# Patient Record
Sex: Female | Born: 1950 | Race: Black or African American | Hispanic: No | Marital: Married | Smoking: Former smoker
Health system: Southern US, Community
[De-identification: ages and names within clinical notes are randomized; demographics above are authoritative.]

## PROBLEM LIST (undated history)

## (undated) DIAGNOSIS — J45909 Unspecified asthma, uncomplicated: Secondary | ICD-10-CM

## (undated) DIAGNOSIS — I10 Essential (primary) hypertension: Secondary | ICD-10-CM

## (undated) DIAGNOSIS — B20 Human immunodeficiency virus [HIV] disease: Secondary | ICD-10-CM

## (undated) DIAGNOSIS — H269 Unspecified cataract: Secondary | ICD-10-CM

## (undated) DIAGNOSIS — T7840XA Allergy, unspecified, initial encounter: Secondary | ICD-10-CM

## (undated) DIAGNOSIS — E785 Hyperlipidemia, unspecified: Secondary | ICD-10-CM

## (undated) DIAGNOSIS — M1990 Unspecified osteoarthritis, unspecified site: Secondary | ICD-10-CM

## (undated) DIAGNOSIS — Z21 Asymptomatic human immunodeficiency virus [HIV] infection status: Secondary | ICD-10-CM

## (undated) DIAGNOSIS — M8580 Other specified disorders of bone density and structure, unspecified site: Secondary | ICD-10-CM

## (undated) DIAGNOSIS — Z5189 Encounter for other specified aftercare: Secondary | ICD-10-CM

## (undated) DIAGNOSIS — F32A Depression, unspecified: Secondary | ICD-10-CM

## (undated) HISTORY — PX: CHOLECYSTECTOMY: SHX55

## (undated) HISTORY — DX: Hyperlipidemia, unspecified: E78.5

## (undated) HISTORY — DX: Human immunodeficiency virus (HIV) disease: B20

## (undated) HISTORY — DX: Depression, unspecified: F32.A

## (undated) HISTORY — DX: Other specified disorders of bone density and structure, unspecified site: M85.80

## (undated) HISTORY — DX: Unspecified cataract: H26.9

## (undated) HISTORY — PX: LAPAROSCOPIC SALPINGOOPHERECTOMY: SUR795

## (undated) HISTORY — PX: OTHER SURGICAL HISTORY: SHX169

## (undated) HISTORY — DX: Unspecified osteoarthritis, unspecified site: M19.90

## (undated) HISTORY — DX: Unspecified asthma, uncomplicated: J45.909

## (undated) HISTORY — PX: CYSTECTOMY: SUR359

## (undated) HISTORY — DX: Encounter for other specified aftercare: Z51.89

## (undated) HISTORY — PX: CYST REMOVAL HAND: SHX6279

## (undated) HISTORY — PX: CATARACT EXTRACTION, BILATERAL: SHX1313

## (undated) HISTORY — DX: Asymptomatic human immunodeficiency virus (hiv) infection status: Z21

## (undated) HISTORY — DX: Allergy, unspecified, initial encounter: T78.40XA

## (undated) HISTORY — DX: Essential (primary) hypertension: I10

---

## 2013-09-14 DIAGNOSIS — Z860101 Personal history of adenomatous and serrated colon polyps: Secondary | ICD-10-CM | POA: Insufficient documentation

## 2013-09-14 DIAGNOSIS — Z86010 Personal history of colonic polyps: Secondary | ICD-10-CM

## 2013-09-14 HISTORY — DX: Personal history of adenomatous and serrated colon polyps: Z86.0101

## 2013-09-14 HISTORY — DX: Personal history of colonic polyps: Z86.010

## 2013-09-14 HISTORY — PX: COLONOSCOPY: SHX174

## 2013-09-14 HISTORY — PX: POLYPECTOMY: SHX149

## 2015-03-20 DIAGNOSIS — M810 Age-related osteoporosis without current pathological fracture: Secondary | ICD-10-CM | POA: Diagnosis not present

## 2015-03-20 DIAGNOSIS — B20 Human immunodeficiency virus [HIV] disease: Secondary | ICD-10-CM | POA: Diagnosis not present

## 2015-03-20 DIAGNOSIS — I10 Essential (primary) hypertension: Secondary | ICD-10-CM | POA: Diagnosis not present

## 2015-07-17 DIAGNOSIS — B20 Human immunodeficiency virus [HIV] disease: Secondary | ICD-10-CM | POA: Diagnosis not present

## 2015-07-17 DIAGNOSIS — Z79899 Other long term (current) drug therapy: Secondary | ICD-10-CM | POA: Diagnosis not present

## 2015-07-17 DIAGNOSIS — M810 Age-related osteoporosis without current pathological fracture: Secondary | ICD-10-CM | POA: Diagnosis not present

## 2015-07-17 DIAGNOSIS — I10 Essential (primary) hypertension: Secondary | ICD-10-CM | POA: Diagnosis not present

## 2015-07-17 DIAGNOSIS — M1990 Unspecified osteoarthritis, unspecified site: Secondary | ICD-10-CM | POA: Diagnosis not present

## 2015-11-13 DIAGNOSIS — R5381 Other malaise: Secondary | ICD-10-CM | POA: Diagnosis not present

## 2015-11-13 DIAGNOSIS — M1990 Unspecified osteoarthritis, unspecified site: Secondary | ICD-10-CM | POA: Diagnosis not present

## 2015-11-13 DIAGNOSIS — B20 Human immunodeficiency virus [HIV] disease: Secondary | ICD-10-CM | POA: Diagnosis not present

## 2015-11-13 DIAGNOSIS — I10 Essential (primary) hypertension: Secondary | ICD-10-CM | POA: Diagnosis not present

## 2015-11-13 DIAGNOSIS — E559 Vitamin D deficiency, unspecified: Secondary | ICD-10-CM | POA: Diagnosis not present

## 2015-11-13 DIAGNOSIS — M810 Age-related osteoporosis without current pathological fracture: Secondary | ICD-10-CM | POA: Diagnosis not present

## 2016-03-18 DIAGNOSIS — R5383 Other fatigue: Secondary | ICD-10-CM | POA: Diagnosis not present

## 2016-03-18 DIAGNOSIS — E782 Mixed hyperlipidemia: Secondary | ICD-10-CM | POA: Diagnosis not present

## 2016-03-18 DIAGNOSIS — B20 Human immunodeficiency virus [HIV] disease: Secondary | ICD-10-CM | POA: Diagnosis not present

## 2016-06-11 DIAGNOSIS — I10 Essential (primary) hypertension: Secondary | ICD-10-CM | POA: Diagnosis not present

## 2016-06-11 DIAGNOSIS — Z23 Encounter for immunization: Secondary | ICD-10-CM | POA: Diagnosis not present

## 2016-06-11 DIAGNOSIS — B20 Human immunodeficiency virus [HIV] disease: Secondary | ICD-10-CM | POA: Diagnosis not present

## 2016-06-11 DIAGNOSIS — M810 Age-related osteoporosis without current pathological fracture: Secondary | ICD-10-CM | POA: Diagnosis not present

## 2016-06-11 DIAGNOSIS — E559 Vitamin D deficiency, unspecified: Secondary | ICD-10-CM | POA: Diagnosis not present

## 2016-06-11 DIAGNOSIS — R600 Localized edema: Secondary | ICD-10-CM | POA: Diagnosis not present

## 2016-08-19 ENCOUNTER — Ambulatory Visit (INDEPENDENT_AMBULATORY_CARE_PROVIDER_SITE_OTHER): Payer: Medicare Other | Admitting: Internal Medicine

## 2016-08-19 ENCOUNTER — Encounter: Payer: Self-pay | Admitting: Internal Medicine

## 2016-08-19 VITALS — BP 144/90 | HR 62 | Temp 98.1°F | Ht 68.0 in | Wt 174.8 lb

## 2016-08-19 DIAGNOSIS — M810 Age-related osteoporosis without current pathological fracture: Secondary | ICD-10-CM | POA: Diagnosis not present

## 2016-08-19 DIAGNOSIS — I10 Essential (primary) hypertension: Secondary | ICD-10-CM | POA: Diagnosis not present

## 2016-08-19 DIAGNOSIS — B20 Human immunodeficiency virus [HIV] disease: Secondary | ICD-10-CM | POA: Diagnosis not present

## 2016-08-19 LAB — COMPLETE METABOLIC PANEL WITHOUT GFR
ALT: 15 U/L (ref 6–29)
AST: 21 U/L (ref 10–35)
Albumin: 4.1 g/dL (ref 3.6–5.1)
Alkaline Phosphatase: 57 U/L (ref 33–130)
BUN: 13 mg/dL (ref 7–25)
CO2: 28 mmol/L (ref 20–31)
Calcium: 9.3 mg/dL (ref 8.6–10.4)
Chloride: 102 mmol/L (ref 98–110)
Creat: 0.94 mg/dL (ref 0.50–0.99)
GFR, Est African American: 73 mL/min
GFR, Est Non African American: 63 mL/min
Glucose, Bld: 87 mg/dL (ref 65–99)
Potassium: 4 mmol/L (ref 3.5–5.3)
Sodium: 140 mmol/L (ref 135–146)
Total Bilirubin: 0.9 mg/dL (ref 0.2–1.2)
Total Protein: 6.7 g/dL (ref 6.1–8.1)

## 2016-08-19 LAB — CBC WITH DIFFERENTIAL/PLATELET
Basophils Absolute: 58 cells/uL (ref 0–200)
Basophils Relative: 1 %
Eosinophils Absolute: 116 cells/uL (ref 15–500)
Eosinophils Relative: 2 %
HCT: 41.5 % (ref 35.0–45.0)
Hemoglobin: 14 g/dL (ref 11.7–15.5)
Lymphocytes Relative: 42 %
Lymphs Abs: 2436 cells/uL (ref 850–3900)
MCH: 30.5 pg (ref 27.0–33.0)
MCHC: 33.7 g/dL (ref 32.0–36.0)
MCV: 90.4 fL (ref 80.0–100.0)
MPV: 9.6 fL (ref 7.5–12.5)
Monocytes Absolute: 580 cells/uL (ref 200–950)
Monocytes Relative: 10 %
Neutro Abs: 2610 cells/uL (ref 1500–7800)
Neutrophils Relative %: 45 %
Platelets: 271 10*3/uL (ref 140–400)
RBC: 4.59 MIL/uL (ref 3.80–5.10)
RDW: 14.2 % (ref 11.0–15.0)
WBC: 5.8 10*3/uL (ref 3.8–10.8)

## 2016-08-19 MED ORDER — ENALAPRIL-HYDROCHLOROTHIAZIDE 10-25 MG PO TABS: 1.0000 | ORAL_TABLET | Freq: Every day | ORAL | 6 refills | Status: DC

## 2016-08-19 NOTE — Progress Notes (Signed)
[name], female   DOB: [birth year], 65 y.o.   MRN: [id number]    Location:  [facility] Place of Service: OFFICE    Advanced Directive information Does Patient Have a Medical Advance Directive?: Yes, Type of Advance Directive: Living will  Chief Complaint  Patient presents with  . Establish Care    New patient to establish care  . Referral    Infectious disease specialists    HPI:  65 yo female seen today as a new pt. She was referred by Dr Lorelei Pont. She needs referral to ID as she needs to continue her HIV meds (complera and isentress). Last HIV load was nondetectable and she does not know CD4 count.  Hx asthma - well controlled. Rarely uses prn HFA  HTN - takes metoprolol succ and enalapril hct  Osteoporosis - stable on fosamax. Last DXA 2 yrs ago  Remote tobacco abuse hx   Past Medical History:  Diagnosis Date  . Asthma   . High blood pressure   . HIV (human immunodeficiency virus infection) ([place])   . Osteoarthritis     History reviewed. No pertinent surgical history.  Patient Care Team: [name], DO as PCP - General (Internal Medicine)  Social History   Social History  . Marital status: Married    Spouse name: N/A  . Number of children: N/A  . Years of education: N/A   Occupational History  . Not on file.   Social History Main Topics  . Smoking status: Former Smoker    Packs/day: 1.00    Years: 40.00    Types: Cigarettes  . Smokeless tobacco: Never Used  . Alcohol use No  . Drug use: No  . Sexual activity: Not on file   Other Topics Concern  . Not on file   Social History Narrative   Diet? Regular      Do you drink/eat things with caffeine? Coffee      Marital status?  Married                                  What year were you married? March 16, 2009      Do you live in a house, apartment, assisted living, condo, trailer, etc.? House      Is it one or more stories? 1 story      How many persons live in your home? 2        Do you have any pets in your home? (please list) yes-dog      Current or past profession: Administrator      Do you exercise?  no                                    Type & how often? n/a      Do you have a living will? no      Do you have a DNR form?     no                             If not, do you want to discuss one? Yes      Do you have signed POA/HPOA for forms? no           reports that she has quit smoking. Her smoking use included  Cigarettes. She has a 40.00 pack-year smoking history. She has never used smokeless tobacco. She reports that she does not drink alcohol or use drugs.  Family History  Problem Relation Age of Onset  . Diabetes Mother     died at age 105  . Hypertension Mother   . Asthma Sister   . Diabetes Sister   . Hypertension Sister   . Arthritis Sister   . Diabetes Sister   . Hypertension Sister   . Asthma Sister   . Arthritis Sister   . Asthma Son     controlled   Family Status  Relation Status  . Mother Deceased  . Father Deceased  . Sister Alive  . Sister Alive  . Sister Alive  . Daughter Alive  . Son Alive    Immunization History  Administered Date(s) Administered  . Influenza-Unspecified 06/11/2016    No Known Allergies  Medications: Patient's Medications  New Prescriptions   No medications on file  Previous Medications   ALENDRONATE (FOSAMAX) 70 MG TABLET    Take 70 mg by mouth once a week. Take with a full glass of water on an empty stomach.   EMTRICITABINE-RILPIVIR-TENOFOVIR DF (COMPLERA) 200-25-300 MG TABLET    Take 1 tablet by mouth daily.   ENALAPRIL-HYDROCHLOROTHIAZIDE 5-12.5 MG TABLET    Take 1 tablet by mouth daily.   METOPROLOL SUCCINATE (TOPROL-XL) 100 MG 24 HR TABLET    Take 100 mg by mouth daily. Take with or immediately following a meal.   RALTEGRAVIR (ISENTRESS) 400 MG TABLET    Take 400 mg by mouth 2 (two) times daily.  Modified Medications   No medications on file  Discontinued Medications   No  medications on file    Review of Systems  HENT: Positive for dental problem (wears dentures).   Eyes:       Cats ou  Gastrointestinal:       Hemorrhoid hx  Hematological: Bruises/bleeds easily.  All other systems reviewed and are negative.   Vitals:   08/19/16 0838  BP: (!) 144/90 repeat BP by myself 140/90  Pulse: 62  Temp: 98.1 F (36.7 C)  TempSrc: Oral  SpO2: 95%  Weight: 174 lb 12.8 oz (79.3 kg)  Height: 5' 8" (1.727 m)   Body mass index is 26.58 kg/m.  Physical Exam  Constitutional: She is oriented to person, place, and time. She appears well-developed and well-nourished.  HENT:  Mouth/Throat: Oropharynx is clear and moist. No oropharyngeal exudate.  Eyes: Pupils are equal, round, and reactive to light. No scleral icterus.  Neck: Neck supple. Carotid bruit is not present. No tracheal deviation present. No thyromegaly present.  Cardiovascular: Normal rate, regular rhythm, normal heart sounds and intact distal pulses.  Exam reveals no gallop and no friction rub.   No murmur heard. No LE edema b/l. no calf TTP.   Pulmonary/Chest: Effort normal and breath sounds normal. No stridor. No respiratory distress. She has no wheezes. She has no rales.  Abdominal: Soft. Bowel sounds are normal. She exhibits no distension and no mass. There is no hepatomegaly. There is no tenderness. There is no rebound and no guarding.  Lymphadenopathy:    She has no cervical adenopathy.  Neurological: She is alert and oriented to person, place, and time.  Skin: Skin is warm and dry. No rash noted.  Psychiatric: She has a normal mood and affect. Her behavior is normal. Judgment and thought content normal.     Labs reviewed: No results found for any   previous visit.    No results found.   Assessment/Plan   ICD-9-CM ICD-10-CM   1. Human immunodeficiency virus (HIV) disease ([place]) 042 B20 Ambulatory referral to Infectious Disease     CMP with eGFR     CBC with Differential/Platelets      CD4 Count     Lymph Enumeration,Helper/Suppressor     CANCELED: T-helper cells (CD4) count (not at [facility])  2. Essential hypertension 401.9 I10 enalapril-hydrochlorothiazide (VASERETIC) 10-25 MG tablet     CBC with Differential/Platelets  3. Age related osteoporosis, unspecified pathological fracture presence 733.01 M81.0    Next available CPE/AWV. Needs fasting lab prior to appt (lipid panel, tsh, UA)  Increase enalapril hct to 2 tabs daily until bottle empty then get new script from pharmacy  Will call with referral to Dr Baxter Flattery  Will call with lab results  Get old [place]. Perlie Gold  [facility] and Adult Medicine [address] [place], [place] [address] [phone] Cell (Monday-Friday 8 AM - 5 PM) [phone] After 5 PM and follow prompts

## 2016-08-19 NOTE — Patient Instructions (Signed)
Next available CPE/AWV. Needs fasting lab prior to appt  Increase enalapril hct to 2 tabs daily until bottle empty then get new script from pharmacy  Will call with referral to Dr Baxter Flattery  Will call with lab results

## 2016-08-20 LAB — LYMPH ENUMERATION,HELPER/SUPPRESSOR
%CD8 (Cytotoxic/Suppressor): 31 % (ref 14–37)
Absolute CD4: 594 cells/uL (ref 381–1469)
CD4 T Helper %: 24 % — ABNORMAL LOW (ref 32–62)
CD4/CD8 Ratio: 0.8 (ref 0.8–5.0)
CD8 T Cell Abs: 765 cells/uL (ref 196–1060)
Total lymphocyte count: 2474 cells/uL (ref 700–3300)

## 2016-08-20 LAB — T-HELPER CELLS (CD4) COUNT (NOT AT ARMC)
Absolute CD4: 594 cells/uL (ref 381–1469)
CD4 T Helper %: 24 % — ABNORMAL LOW (ref 32–62)
Total lymphocyte count: 2474 cells/uL (ref 700–3300)

## 2016-09-09 ENCOUNTER — Other Ambulatory Visit: Payer: Self-pay

## 2016-09-09 ENCOUNTER — Other Ambulatory Visit: Payer: Medicare Other

## 2016-09-09 DIAGNOSIS — I10 Essential (primary) hypertension: Secondary | ICD-10-CM | POA: Diagnosis not present

## 2016-09-09 LAB — LIPID PANEL
Cholesterol: 184 mg/dL (ref <200)
HDL: 44 mg/dL — ABNORMAL LOW (ref >50)
LDL Cholesterol: 122 mg/dL — ABNORMAL HIGH (ref <100)
Total CHOL/HDL Ratio: 4.2 Ratio (ref <5.0)
Triglycerides: 91 mg/dL (ref <150)
VLDL: 18 mg/dL (ref <30)

## 2016-09-09 LAB — TSH: TSH: 0.92 mIU/L

## 2016-09-10 LAB — URINALYSIS, ROUTINE W REFLEX MICROSCOPIC
Bilirubin Urine: NEGATIVE
Glucose, UA: NEGATIVE
Hgb urine dipstick: NEGATIVE
Ketones, ur: NEGATIVE
Leukocytes, UA: NEGATIVE
Nitrite: NEGATIVE
Protein, ur: NEGATIVE
Specific Gravity, Urine: 1.024 (ref 1.001–1.035)
pH: 5.5 (ref 5.0–8.0)

## 2016-09-11 ENCOUNTER — Ambulatory Visit: Payer: Medicare Other

## 2016-09-16 ENCOUNTER — Ambulatory Visit (INDEPENDENT_AMBULATORY_CARE_PROVIDER_SITE_OTHER): Payer: Medicare Other

## 2016-09-16 VITALS — BP 138/88 | HR 68 | Temp 98.0°F | Ht 68.0 in | Wt 175.2 lb

## 2016-09-16 DIAGNOSIS — Z0000 Encounter for general adult medical examination without abnormal findings: Secondary | ICD-10-CM | POA: Diagnosis not present

## 2016-09-16 NOTE — Patient Instructions (Addendum)
[name] , Thank you for taking time to come for your Medicare Wellness Visit. I appreciate your ongoing commitment to your health goals. Please review the following plan we discussed and let me know if I can assist you in the future.   These are the goals we discussed: Goals    . Increase physical activity          Starting 09/16/2016, I will attempt to increase my physical activity to 3 times per day.        This is a list of the screening recommended for you and due dates:  Health Maintenance  Topic Date Due  .  Hepatitis C: One time screening is recommended by Center for Disease Control  (CDC) for  adults born from [birth year] through 1967.   1950/12/19  . Pneumonia vaccines (1 of 2 - PCV13) 03/15/2017*  . Shingles Vaccine  09/16/2017*  . Tetanus Vaccine  09/23/2017*  . Mammogram  02/26/2017  . Colon Cancer Screening  11/03/2023  . Flu Shot  Completed  . DEXA scan (bone density measurement)  Completed  *Topic was postponed. The date shown is not the original due date.  Preventive Care for Adults  A healthy lifestyle and preventive care can promote health and wellness. Preventive health guidelines for adults include the following key practices.  . A routine yearly physical is a good way to check with your health care provider about your health and preventive screening. It is a chance to share any concerns and updates on your health and to receive a thorough exam.  . Visit your dentist for a routine exam and preventive care every 6 months. Brush your teeth twice a day and floss once a day. Good oral hygiene prevents tooth decay and gum disease.  . The frequency of eye exams is based on your age, health, family medical history, use  of contact lenses, and other factors. Follow your health care provider's ecommendations for frequency of eye exams.  . Eat a healthy diet. Foods like vegetables, fruits, whole grains, low-fat dairy products, and lean protein foods contain the nutrients you  need without too many calories. Decrease your intake of foods high in solid fats, added sugars, and salt. Eat the right amount of calories for you. Get information about a proper diet from your health care provider, if necessary.  . Regular physical exercise is one of the most important things you can do for your health. Most adults should get at least 150 minutes of moderate-intensity exercise (any activity that increases your heart rate and causes you to sweat) each week. In addition, most adults need muscle-strengthening exercises on 2 or more days a week.  Silver Sneakers may be a benefit available to you. To determine eligibility, you may visit the website: www.silversneakers.com or contact program at [phone] Mon-Fri between 8AM-8PM.   . Maintain a healthy weight. The body mass index (BMI) is a screening tool to identify possible weight problems. It provides an estimate of body fat based on height and weight. Your health care provider can find your BMI and can help you achieve or maintain a healthy weight.   For adults 20 years and older: ? A BMI below 18.5 is considered underweight. ? A BMI of 18.5 to 24.9 is normal. ? A BMI of 25 to 29.9 is considered overweight. ? A BMI of 30 and above is considered obese.   . Maintain normal blood lipids and cholesterol levels by exercising and minimizing your intake of saturated  fat. Eat a balanced diet with plenty of fruit and vegetables. Blood tests for lipids and cholesterol should begin at age 4 and be repeated every 5 years. If your lipid or cholesterol levels are high, you are over 50, or you are at high risk for heart disease, you may need your cholesterol levels checked more frequently. Ongoing high lipid and cholesterol levels should be treated with medicines if diet and exercise are not working.  . If you smoke, find out from your health care provider how to quit. If you do not use tobacco, please do not start.  . If you choose to drink  alcohol, please do not consume more than 2 drinks per day. One drink is considered to be 12 ounces (355 mL) of beer, 5 ounces (148 mL) of wine, or 1.5 ounces (44 mL) of liquor.  . If you are 94-68 years old, ask your health care provider if you should take aspirin to prevent strokes.  . Use sunscreen. Apply sunscreen liberally and repeatedly throughout the day. You should seek shade when your shadow is shorter than you. Protect yourself by wearing long sleeves, pants, a wide-brimmed hat, and sunglasses year round, whenever you are outdoors.  . Once a month, do a whole body skin exam, using a mirror to look at the skin on your back. Tell your health care provider of new moles, moles that have irregular borders, moles that are larger than a pencil eraser, or moles that have changed in shape or color.

## 2016-09-16 NOTE — Progress Notes (Signed)
Subjective:   Maria Adkins is a 66 y.o. female who presents for an Initial Medicare Annual Wellness Visit.  Review of Systems     Cardiac Risk Factors include: advanced age (>61men, >30 women);hypertension     Objective:    Today's Vitals   09/16/16 1033  BP: 138/88  Pulse: 68  Temp: 98 F (36.7 C)  TempSrc: Oral  SpO2: 95%  Weight: 175 lb 3.2 oz (79.5 kg)  Height: 5\' 8"  (1.727 m)   Body mass index is 26.64 kg/m.   Current Medications (verified) Outpatient Encounter Prescriptions as of 09/16/2016  Medication Sig  . alendronate (FOSAMAX) 70 MG tablet Take 70 mg by mouth once a week. Take with a full glass of water on an empty stomach.  [name] emtricitabine-rilpivir-tenofovir DF (COMPLERA) 200-25-300 MG tablet Take 1 tablet by mouth daily.  . enalapril-hydrochlorothiazide (VASERETIC) 10-25 MG tablet Take 1 tablet by mouth daily.  . raltegravir (ISENTRESS) 400 MG tablet Take 400 mg by mouth 2 (two) times daily.  . [DISCONTINUED] metoprolol succinate (TOPROL-XL) 100 MG 24 hr tablet Take 100 mg by mouth daily. Take with or immediately following a meal.   No facility-administered encounter medications on file as of 09/16/2016.     Allergies (verified) Patient has no known allergies.   History: Past Medical History:  Diagnosis Date  . Asthma   . High blood pressure   . HIV (human immunodeficiency virus infection) ([place])   . Osteoarthritis    Past Surgical History:  Procedure Laterality Date  . Biopsy of Liver    . CATARACT EXTRACTION, BILATERAL    . CHOLECYSTECTOMY    . CYST REMOVAL HAND Left   . LAPAROSCOPIC SALPINGOOPHERECTOMY Right    Family History  Problem Relation Age of Onset  . Diabetes Mother     died at age 31  . Hypertension Mother   . Asthma Sister   . Diabetes Sister   . Hypertension Sister   . Arthritis Sister   . Diabetes Sister   . Hypertension Sister   . Asthma Sister   . Arthritis Sister   . Asthma Son     controlled   Social History    Occupational History  . Not on file.   Social History Main Topics  . Smoking status: Former Smoker    Packs/day: 1.00    Years: 40.00    Types: Cigarettes  . Smokeless tobacco: Never Used  . Alcohol use No  . Drug use: No  . Sexual activity: Yes    Tobacco Counseling Counseling given: No   Activities of Daily Living In your present state of health, do you have any difficulty performing the following activities: 09/16/2016  Hearing? N  Vision? N  Difficulty concentrating or making decisions? Y  Walking or climbing stairs? N  Dressing or bathing? N  Doing errands, shopping? N  Preparing Food and eating ? N  Using the Toilet? N  In the past six months, have you accidently leaked urine? N  Do you have problems with loss of bowel control? N  Managing your Medications? N  Managing your Finances? N  Housekeeping or managing your Housekeeping? N  Some recent data might be hidden    Immunizations and Health Maintenance Immunization History  Administered Date(s) Administered  . Influenza-Unspecified 06/11/2016   Health Maintenance Due  Topic Date Due  . Hepatitis C Screening  1951/08/14    Patient Care Team: [name], DO as PCP - General (Internal Medicine)  Indicate any  recent Medical Services you may have received from other than [facility] providers in the past year (date may be approximate).     Assessment:   This is a routine wellness examination for [name].  Hearing/Vision screen  Hearing Screening   125Hz  250Hz  500Hz  1000Hz  2000Hz  3000Hz  4000Hz  6000Hz  8000Hz   Right ear:   100 0 0  100    Left ear:   100 100 100  100    Comments: Pt has not hearing screen. Pt has not noticed a problem hearing.    Visual Acuity Screening   Right eye Left eye Both eyes  Without correction: 20/20 20/25 20/20-1  With correction:     Comments: Last eye exam done last year in [place]. Due for an eye exam.    Dietary issues and exercise activities discussed: Current  Exercise Habits: Structured exercise class, Type of exercise: strength training/weights;walking;stretching, Time (Minutes): 20, Frequency (Times/Week): 7, Weekly Exercise (Minutes/Week): 140, Intensity: Moderate  Goals    . Increase physical activity          Starting 09/16/2016, I will attempt to increase my physical activity to 3 times per day.       Depression Screen PHQ 2/9 Scores 09/16/2016 08/19/2016  PHQ - 2 Score 1 0    Fall Risk Fall Risk  09/16/2016  Falls in the past year? No    Cognitive Function: MMSE - Mini Mental State Exam 09/16/2016  Orientation to time 5  Orientation to Place 5  Registration 3  Attention/ Calculation 5  Recall 3  Language- name 2 objects 2  Language- repeat 1  Language- follow 3 step command 3  Language- read & follow direction 1  Write a sentence 1  Copy design 1  Total score 30        Screening Tests Health Maintenance  Topic Date Due  . Hepatitis C Screening  1951/05/21  . PNA vac Low Risk Adult (1 of 2 - PCV13) 03/15/2017 (Originally 04/07/2014)  . ZOSTAVAX  09/16/2017 (Originally 04/07/2009)  . TETANUS/TDAP  09/23/2017 (Originally 04/07/1968)  . MAMMOGRAM  02/26/2017  . COLONOSCOPY  11/03/2023  . INFLUENZA VACCINE  Completed  . DEXA SCAN  Completed      Plan:    I have personally reviewed and addressed the Medicare Annual Wellness questionnaire and have noted the following in the patient's chart:  A. Medical and social history B. Use of alcohol, tobacco or illicit drugs  C. Current medications and supplements D. Functional ability and status E.  Nutritional status F.  Physical activity G. Advance directives H. List of other physicians I.  Hospitalizations, surgeries, and ER visits in previous 12 months J.  [place] to include hearing, vision, cognitive, depression L. Referrals and appointments - none  In addition, I have reviewed and discussed with patient certain preventive protocols, quality metrics, and best  practice recommendations. A written personalized care plan for preventive services as well as general preventive health recommendations were provided to patient.  See attached scanned questionnaire for additional information.   Signed,   Allyn Kenner, LPN Health Advisor    I have reviewed the health advisor's note and was available for consultation. I agree with documentation and plan.   Marigrace Mccole S. Perlie Gold  [facility] and Adult Medicine [address] [place], [place] [address] [phone] Cell (Monday-Friday 8 AM - 5 PM) [phone] After 5 PM and follow prompts

## 2016-09-16 NOTE — Progress Notes (Signed)
Quick Notes   Health Maintenance:  Due to pt's HIV, she would like to research the Pneumonia series before getting it.     Abnormal Screen: None; MMSE-30/30; Passed Clock test    Patient Concerns:  Since moving down from [place] and having to change insurances, pt no longer gets HIV meds for free. She is unable to pay for them OOP, with 1 med over $1000 per month. She has filled out paperwork for a program with the manufacture but she said it had to be filled out from you as well. I am also referring her to [facility] to see what other assistance we can help her with. Pt also requests to have a sleep aid, as she has been having a hard time sleeping at night since the move.     Nurse Concerns:  None

## 2016-09-24 ENCOUNTER — Encounter: Payer: Self-pay | Admitting: Internal Medicine

## 2016-09-24 ENCOUNTER — Telehealth: Payer: Self-pay

## 2016-09-24 ENCOUNTER — Ambulatory Visit (INDEPENDENT_AMBULATORY_CARE_PROVIDER_SITE_OTHER): Payer: Medicare Other | Admitting: Internal Medicine

## 2016-09-24 VITALS — BP 131/85 | HR 85 | Temp 98.0°F | Ht 68.0 in | Wt 176.1 lb

## 2016-09-24 DIAGNOSIS — B20 Human immunodeficiency virus [HIV] disease: Secondary | ICD-10-CM

## 2016-09-24 LAB — CBC WITH DIFFERENTIAL/PLATELET
Basophils Absolute: 59 cells/uL (ref 0–200)
Basophils Relative: 1 %
Eosinophils Absolute: 118 cells/uL (ref 15–500)
Eosinophils Relative: 2 %
HCT: 40.2 % (ref 35.0–45.0)
Hemoglobin: 13.5 g/dL (ref 11.7–15.5)
Lymphocytes Relative: 39 %
Lymphs Abs: 2301 cells/uL (ref 850–3900)
MCH: 30.2 pg (ref 27.0–33.0)
MCHC: 33.6 g/dL (ref 32.0–36.0)
MCV: 89.9 fL (ref 80.0–100.0)
MPV: 9.7 fL (ref 7.5–12.5)
Monocytes Absolute: 531 cells/uL (ref 200–950)
Monocytes Relative: 9 %
Neutro Abs: 2891 cells/uL (ref 1500–7800)
Neutrophils Relative %: 49 %
Platelets: 260 10*3/uL (ref 140–400)
RBC: 4.47 MIL/uL (ref 3.80–5.10)
RDW: 14.1 % (ref 11.0–15.0)
WBC: 5.9 10*3/uL (ref 3.8–10.8)

## 2016-09-24 LAB — COMPLETE METABOLIC PANEL WITH GFR
ALT: 19 U/L (ref 6–29)
AST: 23 U/L (ref 10–35)
Albumin: 3.9 g/dL (ref 3.6–5.1)
Alkaline Phosphatase: 49 U/L (ref 33–130)
BUN: 13 mg/dL (ref 7–25)
CO2: 30 mmol/L (ref 20–31)
Calcium: 9.4 mg/dL (ref 8.6–10.4)
Chloride: 101 mmol/L (ref 98–110)
Creat: 0.91 mg/dL (ref 0.50–0.99)
GFR, Est African American: 76 mL/min (ref >=60)
GFR, Est Non African American: 65 mL/min (ref >=60)
Glucose, Bld: 90 mg/dL (ref 65–99)
Potassium: 3.8 mmol/L (ref 3.5–5.3)
Sodium: 137 mmol/L (ref 135–146)
Total Bilirubin: 1 mg/dL (ref 0.2–1.2)
Total Protein: 6.8 g/dL (ref 6.1–8.1)

## 2016-09-24 LAB — HEPATITIS C ANTIBODY: HCV Ab: NEGATIVE

## 2016-09-24 LAB — HEPATITIS A ANTIBODY, TOTAL: Hep A Total Ab: REACTIVE — AB

## 2016-09-24 LAB — HEPATITIS B SURFACE ANTIGEN: Hepatitis B Surface Ag: NEGATIVE

## 2016-09-24 LAB — HEPATITIS B SURFACE ANTIBODY,QUALITATIVE: Hep B S Ab: POSITIVE — AB

## 2016-09-24 MED ORDER — EMTRICITAB-RILPIVIR-TENOFOV AF 200-25-25 MG PO TABS: 1.0000 | ORAL_TABLET | Freq: Every day | ORAL | 5 refills | Status: DC

## 2016-09-24 MED ORDER — DOLUTEGRAVIR SODIUM 50 MG PO TABS: 50.0000 mg | ORAL_TABLET | Freq: Every day | ORAL | 5 refills | Status: DC

## 2016-09-24 NOTE — Progress Notes (Signed)
HPI: Maria Adkins is a 66 y.o. female who presents to the [facility] as a new HIV transfer to see Dr. Baxter Flattery. She is currently on Complera + Isentress.  Allergies: No Known Allergies  Past Medical History: Past Medical History:  Diagnosis Date  . Asthma   . High blood pressure   . HIV (human immunodeficiency virus infection) ([place])   . Osteoarthritis     Social History: Social History   Social History  . Marital status: Married    Spouse name: N/A  . Number of children: N/A  . Years of education: N/A   Social History Main Topics  . Smoking status: Former Smoker    Packs/day: 1.00    Years: 40.00    Types: Cigarettes  . Smokeless tobacco: Never Used  . Alcohol use No  . Drug use: No  . Sexual activity: Yes   Other Topics Concern  . None   Social History Narrative   Diet? Regular      Do you drink/eat things with caffeine? Coffee      Marital status?  Married                                  What year were you married? March 16, 2009      Do you live in a house, apartment, assisted living, condo, trailer, etc.? House      Is it one or more stories? 1 story      How many persons live in your home? 2      Do you have any pets in your home? (please list) yes-dog      Current or past profession: Administrator      Do you exercise?  no                                    Type & how often? n/a      Do you have a living will? no      Do you have a DNR form?     no                             If not, do you want to discuss one? Yes      Do you have signed POA/HPOA for forms? no          Current Regimen: Complera + Isentress  Labs: No results found for: HIV1RNAQUANT, HIV1RNAVL, CD4TABS, HEPBSAB, HEPBSAG, HCVAB  CrCl: CrCl cannot be calculated (Patient's most recent lab result is older than the maximum 21 days allowed.).  Lipids:    Component Value Date/Time   CHOL 184 09/09/2016 0850   TRIG 91 09/09/2016 0850   HDL 44 (L) 09/09/2016 0850   CHOLHDL 4.2  09/09/2016 0850   VLDL 18 09/09/2016 0850   LDLCALC 122 (H) 09/09/2016 0850    Assessment: Maria Adkins is here today to initiate care in [place].  She is from [place] and has had HIV since the 1980s. She tells me she has been on Complera + Isentress for quite some time.  She states she was on Truvada beforehand but developed a rash and was taken off that and put on Complera, which doesn't make too much sense. She doesn't remember her regimens prior to that.  She tells me her insurance has changed  at start of new year - she has Medicare Part D with [employer]. She paid a little over $1700 for her Complera and Isentress last week as she did not have any co pay assistance. She is meeting with Maria Adkins now to get PAF and assistance with paying her copay as that is ridiculous and not practical.  We will change her regimen to [place] until we can get labs and records from her other clinic in [place]. I explained the two new medications to her and showed them to her on the HIV chart. She is excited and willing to change.  She states she does not miss any doses and never will. She will f/u with me after starting her new medications.  Plans: - Stop Complera and Isentress - Start Tivicay 50 mg PO daily - Start O[place]sey 1 tab PO daily with meals - Fill at [facility] - F/u with me 2/22 at [place]. Westley Gambles, PharmD Infectious Diseases [facility] 09/24/2016, 10:10 AM

## 2016-09-24 NOTE — Progress Notes (Signed)
RFV: establishing care  [name], female   DOB: 06-27-1951, 66 y.o.   MRN: [id number]  HPI 66yo F on raltegravir/complera has been taking this combination since may 2011. CD 4 count 594/VL unknown? "i have been undetectable for years"   Married in July in 2010, and was diagnosed April 2011+. Jan/FEb of 2011 started to have dysphagia found to have candidal esophagitis and also with associated hospitalization. In the meantime, cachexia, weight loss to 100# and fatigue set in -> then she was referred to [place] who diagnosed HIV. Her husband is HIV negative.  She was thought to be diagnosed with advanced hiv disease.  The thought is that she may have contracted it through blood transfusion. When she was 68- 57 yo, she had life threatening hospitalization requiring surgery, complicated by internal bleeding where she also had hemoptysis, required initial hospitalization in [place] then transferred to [facility] for management.   Just relocated from [place] to [facility] in Oct 2017. Now establishing care. Moved down to the [place] with her husband to be closer to her son and new grandchildren.  Currently takes raltegravir 2 tabs once a day, plus complera  Wants to join gym but on limited income and just started getting involved in church. Does walk every day with her dog.   Outpatient Encounter Prescriptions as of 09/24/2016  Medication Sig  . alendronate (FOSAMAX) 70 MG tablet Take 70 mg by mouth once a week. Take with a full glass of water on an empty stomach.  [name] emtricitabine-rilpivir-tenofovir DF (COMPLERA) 200-25-300 MG tablet Take 1 tablet by mouth daily.  . enalapril-hydrochlorothiazide (VASERETIC) 10-25 MG tablet Take 1 tablet by mouth daily.  . raltegravir (ISENTRESS) 400 MG tablet Take 400 mg by mouth 2 (two) times daily.   No facility-administered encounter medications on file as of 09/24/2016.      Patient Active Problem List   Diagnosis Date Noted  . Age related  osteoporosis 08/19/2016  . Essential hypertension 08/19/2016  . Human immunodeficiency virus (HIV) disease ([place]) 08/19/2016  - asthma - s/p gallbladder @ age 63 -s/p fallopian tube - hx of hematemesis -  -s/p liver biopsy c/b bleeding - s/p ganglion cyst removal of left hand - hand surgery from fracture, left hand   Health Maintenance Due  Topic Date Due  . Hepatitis C Screening  1950/10/29    Family History  Problem Relation Age of Onset  . Diabetes Mother     died at age 60  . Hypertension Mother   . Asthma Sister   . Diabetes Sister   . Hypertension Sister   . Arthritis Sister   . Diabetes Sister   . Hypertension Sister   . Asthma Sister   . Arthritis Sister   . Asthma Son     controlled   Social History  Substance Use Topics  . Smoking status: Former Smoker    Packs/day: 1.00    Years: 40.00    Types: Cigarettes  . Smokeless tobacco: Never Used  . Alcohol use No   Review of Systems   Constitutional: Negative for fever, chills, diaphoresis, activity change, appetite change, fatigue and unexpected weight change.  HENT: Negative for congestion, sore throat, rhinorrhea, sneezing, trouble swallowing and sinus pressure.  Eyes: Negative for photophobia and visual disturbance.  Respiratory: Negative for cough, chest tightness, shortness of breath, wheezing and stridor.  Cardiovascular: Negative for chest pain, palpitations and leg swelling.  Gastrointestinal: Negative for nausea, vomiting, abdominal pain, diarrhea, constipation,  blood in stool, abdominal distention and anal bleeding.  Genitourinary: Negative for dysuria, hematuria, flank pain and difficulty urinating.  Musculoskeletal: Negative for myalgias, back pain, joint swelling, arthralgias and gait problem.  Skin: Negative for color change, pallor, rash and wound.  Neurological: Negative for dizziness, tremors, weakness and light-headedness.  Hematological: Negative for adenopathy. Does not bruise/bleed  easily.  Psychiatric/Behavioral: Negative for behavioral problems, confusion, sleep disturbance, dysphoric mood, decreased concentration and agitation.    Physical Exam   BP 131/85   Pulse 85   Temp 98 F (36.7 C) (Oral)   Ht 5\' 8"  (1.727 m)   Wt 176 lb 1.9 oz (79.9 kg)   BMI 26.78 kg/m   Physical Exam  Constitutional:  oriented to person, place, and time. appears well-developed and well-nourished. No distress.  HENT: [place]/AT, PERRLA, no scleral icterus Mouth/Throat: Oropharynx is clear and moist. No oropharyngeal exudate.  Cardiovascular: Normal rate, regular rhythm and normal heart sounds. Exam reveals no gallop and no friction rub.  No murmur heard.  Pulmonary/Chest: Effort normal and breath sounds normal. No respiratory distress.  has no wheezes.  Neck = supple, no nuchal rigidity Abdominal: Soft. Bowel sounds are normal.  exhibits no distension. There is no tenderness.  Lymphadenopathy: no cervical adenopathy. No axillary adenopathy Neurological: alert and oriented to person, place, and time.  Skin: Skin is warm and dry. No rash noted. No erythema.  Psychiatric: a normal mood and affect.  behavior is normal.   Lab Results  Component Value Date   CD4TCELL 24 (L) 08/19/2016   CD4TCELL 24 (L) 08/19/2016   CBC Lab Results  Component Value Date   WBC 5.8 08/19/2016   RBC 4.59 08/19/2016   HGB 14.0 08/19/2016   HCT 41.5 08/19/2016   PLT 271 08/19/2016   MCV 90.4 08/19/2016   MCH 30.5 08/19/2016   MCHC 33.7 08/19/2016   RDW 14.2 08/19/2016   LYMPHSABS 2,436 08/19/2016   MONOABS 580 08/19/2016   EOSABS 116 08/19/2016    BMET Lab Results  Component Value Date   NA 140 08/19/2016   K 4.0 08/19/2016   CL 102 08/19/2016   CO2 28 08/19/2016   GLUCOSE 87 08/19/2016   BUN 13 08/19/2016   CREATININE 0.94 08/19/2016   CALCIUM 9.3 08/19/2016   GFRNONAA 63 08/19/2016   GFRAA 73 08/19/2016      Assessment and Plan  hiv disease= will get lab work today to check cd 4  count and viral load . Will switch to tivicay plus odesfey. We will get her to meet with Maria Adkins to help with out of pocket cost from medicare part d. Gave precautions to how to take meds.  Will need to get old records to see what vaccines needs to be administered  rtc in 2-3 wk for evaluation  Spent 30 min with patient with greater than 50% in coordination of care

## 2016-09-24 NOTE — Telephone Encounter (Signed)
[name], DO  Brach Birdsall A Timiyah Romito, RMA        Pt had concerns regarding cost of HIV meds. Please let her know to take all forms to her appt with Dr Baxter Flattery next week. I am sure that there are programs in [place] that will allow her to get her meds for free and Dr Baxter Flattery will be quite familiar with them. Thanks    Spoke with patient regarding Dr. Saralyn Pilar message she expressed understanding.

## 2016-09-28 LAB — HIV-1 RNA,QN PCR W/REFLEX GENOTYPE
HIV-1 RNA, QN PCR: <1.3 Log cps/mL — ABNORMAL HIGH (ref <1.30)
HIV-1 RNA, QN PCR: <20 copies/mL — ABNORMAL HIGH (ref <20)

## 2016-10-08 ENCOUNTER — Other Ambulatory Visit: Payer: Self-pay | Admitting: Pharmacist

## 2016-10-08 DIAGNOSIS — B20 Human immunodeficiency virus [HIV] disease: Secondary | ICD-10-CM

## 2016-10-08 MED ORDER — DOLUTEGRAVIR SODIUM 50 MG PO TABS: 50.0000 mg | ORAL_TABLET | Freq: Every day | ORAL | 5 refills | Status: DC

## 2016-10-08 MED ORDER — EMTRICITAB-RILPIVIR-TENOFOV AF 200-25-25 MG PO TABS: 1.0000 | ORAL_TABLET | Freq: Every day | ORAL | 5 refills | Status: DC

## 2016-10-15 ENCOUNTER — Other Ambulatory Visit: Payer: Self-pay | Admitting: *Deleted

## 2016-10-15 DIAGNOSIS — B20 Human immunodeficiency virus [HIV] disease: Secondary | ICD-10-CM

## 2016-10-15 MED ORDER — DOLUTEGRAVIR SODIUM 50 MG PO TABS: 50.0000 mg | ORAL_TABLET | Freq: Every day | ORAL | 5 refills | Status: DC

## 2016-10-15 MED ORDER — EMTRICITAB-RILPIVIR-TENOFOV AF 200-25-25 MG PO TABS: 1.0000 | ORAL_TABLET | Freq: Every day | ORAL | 5 refills | Status: DC

## 2016-10-16 ENCOUNTER — Encounter: Payer: Self-pay | Admitting: *Deleted

## 2016-10-16 ENCOUNTER — Telehealth: Payer: Self-pay | Admitting: *Deleted

## 2016-10-16 DIAGNOSIS — I10 Essential (primary) hypertension: Secondary | ICD-10-CM

## 2016-10-16 MED ORDER — METOPROLOL TARTRATE 100 MG PO TABS: 100.0000 mg | ORAL_TABLET | Freq: Every day | ORAL | 1 refills | Status: DC

## 2016-10-16 NOTE — Telephone Encounter (Signed)
Ok to RF med - please clarify whether this is XL or plain metoprolol. Please give 5 RF

## 2016-10-16 NOTE — Telephone Encounter (Signed)
Patient called and stated that she needs a refill on Metoprolol Tartrate 100mg  once daily. The medication is not in patient's current medication list. Patient states she takes this everyday. Is this ok to add to list and refill? Please Advise.

## 2016-10-16 NOTE — Telephone Encounter (Signed)
Metoprolol Tartrate 100mg . Faxed to pharmacy.

## 2016-10-26 ENCOUNTER — Telehealth: Payer: Self-pay | Admitting: *Deleted

## 2016-10-26 NOTE — Telephone Encounter (Signed)
Patient called and left a detailed voice mail c/o headache and heart flutters since starting new medications Tivicay and Odefsey. Spoke to pharmacist Cassie and headache can be a side effect. Attempted to call patient back to advise her to go to ED for "heart flutters" and got her voice mail. Asked her to return call.

## 2016-10-27 ENCOUNTER — Telehealth: Payer: Self-pay | Admitting: Pharmacist

## 2016-10-27 NOTE — Telephone Encounter (Signed)
Spoke to [place] regarding her concerns and side effects.  She started her new regimen of Tivicay and Odefsey around the beginning of February.  She states she has had a headache off and on since then - nothing taken OTC yet.  She wanted to call me first to make sure she could take something OTC.  Instructed her to take Motrin 400 mg BID-TID PRN for headaches.  Told her that it could very well be due to the [place] or [place] but it should go away after 3-4 weeks on medication.  She also complains of heart fluttering.  Comes and goes quickly.  She is on lopressor and vaseretic for BP control.  Told her that her heart fluttering most likely isn't due to the HIV medications.  Instructed her to keep an eye on them and if she ever had chest pain or the fluttering did not go away to go to the ED or urgent care immediately.  Also told he to alert her PCP regarding the heart fluttering as well.

## 2016-11-05 ENCOUNTER — Ambulatory Visit (INDEPENDENT_AMBULATORY_CARE_PROVIDER_SITE_OTHER): Payer: Medicare Other | Admitting: Pharmacist

## 2016-11-05 DIAGNOSIS — B20 Human immunodeficiency virus [HIV] disease: Secondary | ICD-10-CM | POA: Diagnosis not present

## 2016-11-05 NOTE — Progress Notes (Signed)
HPI: Maria Adkins is a 66 y.o. female who presents to the [facility] clinic today for HIV follow-up.   Allergies: No Known Allergies  Past Medical History: Past Medical History:  Diagnosis Date  . Asthma   . High blood pressure   . HIV (human immunodeficiency virus infection) ([place])   . Osteoarthritis     Social History: Social History   Social History  . Marital status: Married    Spouse name: N/A  . Number of children: N/A  . Years of education: N/A   Social History Main Topics  . Smoking status: Former Smoker    Packs/day: 1.00    Years: 40.00    Types: Cigarettes  . Smokeless tobacco: Never Used  . Alcohol use No  . Drug use: No  . Sexual activity: Yes   Other Topics Concern  . Not on file   Social History Narrative   Diet? Regular      Do you drink/eat things with caffeine? Coffee      Marital status?  Married                                  What year were you married? March 16, 2009      Do you live in a house, apartment, assisted living, condo, trailer, etc.? House      Is it one or more stories? 1 story      How many persons live in your home? 2      Do you have any pets in your home? (please list) yes-dog      Current or past profession: Administrator      Do you exercise?  no                                    Type & how often? n/a      Do you have a living will? no      Do you have a DNR form?     no                             If not, do you want to discuss one? Yes      Do you have signed POA/HPOA for forms? no          Current Regimen: Odefsey + Tivicay  Labs: Hep B S Ab (no units)  Date Value  09/24/2016 POS (A)   Hepatitis B Surface Ag (no units)  Date Value  09/24/2016 NEGATIVE   HCV Ab (no units)  Date Value  09/24/2016 NEGATIVE    CrCl: CrCl cannot be calculated (Patient's most recent lab result is older than the maximum 21 days allowed.).  Lipids:    Component Value Date/Time   CHOL 184 09/09/2016 0850   TRIG 91 09/09/2016 0850   HDL 44 (L) 09/09/2016 0850   CHOLHDL 4.2 09/09/2016 0850   VLDL 18 09/09/2016 0850   LDLCALC 122 (H) 09/09/2016 0850    Assessment: Maria Adkins is here today to follow-up with me after switching to Maria Adkins and Tivicay.  She recently started this new regimen about 2 weeks ago.  She was on Complera and Isentress prior to the switch.  She did call last week and complain about headaches.  I told her about a week and a  half ago to take ibuoprofen to help stop the headaches.  Also told her it should go away.  She presents today and still complains about headaches.  She states it happens about every other day. She takes ibuprofen and it does help.  This could be due to the [place] together.  These side effects commonly go away after around 1 month of therapy.  She has not missed any doses and is very adamant about taking them around the same time every day. We decided to continue the [place] until she sees Dr. Baxter Adkins next Thursday at which we can discuss possibly switching.  Her HIV viral load was checked on 1/11 and it was undetectable. She does not recall any resistance. She is enjoying [place] and has joined [employer] and has started doing yoga.  Plans: - Start Tivicay 50 mg PO daily - Start Odefsey 200-25-25 mg PO daily - F/u with Dr. Baxter Adkins 3/1 at 9:30am - Consider switching to alternate regimen if headaches do not go away  Maria Adkins, PharmD, [facility] 11/05/2016, 9:44 AM

## 2016-11-11 ENCOUNTER — Encounter: Payer: Self-pay | Admitting: Internal Medicine

## 2016-11-12 ENCOUNTER — Encounter: Payer: Self-pay | Admitting: Internal Medicine

## 2016-11-12 ENCOUNTER — Ambulatory Visit (INDEPENDENT_AMBULATORY_CARE_PROVIDER_SITE_OTHER): Payer: Medicare Other | Admitting: Internal Medicine

## 2016-11-12 VITALS — BP 153/85 | HR 58 | Temp 98.4°F | Ht 68.0 in | Wt 175.0 lb

## 2016-11-12 DIAGNOSIS — G44019 Episodic cluster headache, not intractable: Secondary | ICD-10-CM

## 2016-11-12 DIAGNOSIS — B20 Human immunodeficiency virus [HIV] disease: Secondary | ICD-10-CM

## 2016-11-12 DIAGNOSIS — I10 Essential (primary) hypertension: Secondary | ICD-10-CM

## 2016-11-12 NOTE — Progress Notes (Signed)
[name], female   DOB: January 05, 1951, 66 y.o.   MRN: [id number]  HPI 66yo F with HIV disease, CD 4 count of 765/VL ?Maria Adkins Previously undetectable. We switched her to tivicay and descovy which she is tolerating now. Initially had some headache but now improved She states that she has sore throat when she wakes up in the morning. No other associated symtpoms  Outpatient Encounter Prescriptions as of 11/12/2016  Medication Sig  . dolutegravir (TIVICAY) 50 MG tablet Take 1 tablet (50 mg total) by mouth daily.  Maria Adkins emtricitabine-rilpivir-tenofovir AF (ODEFSEY) 200-25-25 MG TABS tablet Take 1 tablet by mouth daily with breakfast.  . enalapril-hydrochlorothiazide (VASERETIC) 10-25 MG tablet Take 1 tablet by mouth daily.  . metoprolol (LOPRESSOR) 100 MG tablet Take 1 tablet (100 mg total) by mouth daily.  Maria Adkins alendronate (FOSAMAX) 70 MG tablet Take 70 mg by mouth once a week. Take with a full glass of water on an empty stomach.   No facility-administered encounter medications on file as of 11/12/2016.      Patient Active Problem List   Diagnosis Date Noted  . 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 Essential hypertension 08/19/2016  . Human immunodeficiency virus (HIV) disease ([place]) 08/19/2016     There are no preventive care reminders to display for this patient.   Review of Systems Per hpi , otherwise 12 point ROS is negative Physical Exam   BP (!) 153/85   Pulse (!) 58   Temp 98.4 F (36.9 C) (Oral)   Ht 5\' 8"  (1.727 m)   Wt 175 lb (79.4 kg)   BMI 26.61 kg/m   Physical Exam  Constitutional:  oriented to person, place, and time. appears well-developed and well-nourished. No distress.  HENT: [place]/AT, PERRLA, no scleral icterus Mouth/Throat: Oropharynx is clear and moist. No oropharyngeal exudate.  Cardiovascular: Normal rate, regular rhythm and normal heart sounds. Exam reveals no gallop and no friction rub.  No murmur heard.  Pulmonary/Chest: Effort normal and breath  sounds normal. No respiratory distress.  has no wheezes.  Neck = supple, no nuchal rigidity Abdominal: Soft. Bowel sounds are normal.  exhibits no distension. There is no tenderness.  Lymphadenopathy: no cervical adenopathy. No axillary adenopathy Neurological: alert and oriented to person, place, and time.  Skin: Skin is warm and dry. No rash noted. No erythema.  Psychiatric: a normal mood and affect.  behavior is normal.   Lab Results  Component Value Date   CD4TCELL 24 (L) 08/19/2016   CD4TCELL 24 (L) 08/19/2016   No results found for: CD4TABS No results found for: HIV1RNAQUANT Lab Results  Component Value Date   HEPBSAB POS (A) 09/24/2016   No results found for: RPR, LABRPR  CBC Lab Results  Component Value Date   WBC 5.9 09/24/2016   RBC 4.47 09/24/2016   HGB 13.5 09/24/2016   HCT 40.2 09/24/2016   PLT 260 09/24/2016   MCV 89.9 09/24/2016   MCH 30.2 09/24/2016   MCHC 33.6 09/24/2016   RDW 14.1 09/24/2016   LYMPHSABS 2,301 09/24/2016   MONOABS 531 09/24/2016   EOSABS 118 09/24/2016    BMET Lab Results  Component Value Date   NA 137 09/24/2016   K 3.8 09/24/2016   CL 101 09/24/2016   CO2 30 09/24/2016   GLUCOSE 90 09/24/2016   BUN 13 09/24/2016   CREATININE 0.91 09/24/2016   CALCIUM 9.4 09/24/2016   GFRNONAA 65 09/24/2016   GFRAA 76 09/24/2016      Assessment  and Plan  hiv disease =continue with new regimen. Will check viral load  Sore throat = likely from dry air. Can do otc throat logenzes prn. No signs of infection/irritation on exam  Headache = prn tylenol  Essential htn = today not at goal. Continue to monitor at next appt to see if need adjust meds

## 2016-11-16 LAB — HIV-1 RNA QUANT-NO REFLEX-BLD
HIV 1 RNA Quant: 22 copies/mL — ABNORMAL HIGH
HIV-1 RNA Quant, Log: 1.34 Log copies/mL — ABNORMAL HIGH

## 2016-12-16 ENCOUNTER — Ambulatory Visit (INDEPENDENT_AMBULATORY_CARE_PROVIDER_SITE_OTHER): Payer: Medicare Other | Admitting: Internal Medicine

## 2016-12-16 ENCOUNTER — Encounter: Payer: Self-pay | Admitting: Internal Medicine

## 2016-12-16 DIAGNOSIS — I10 Essential (primary) hypertension: Secondary | ICD-10-CM

## 2016-12-16 DIAGNOSIS — T7840XA Allergy, unspecified, initial encounter: Secondary | ICD-10-CM | POA: Diagnosis not present

## 2016-12-16 MED ORDER — PREDNISONE 5 MG (21) PO TBPK: ORAL_TABLET | ORAL | 0 refills | Status: DC

## 2016-12-16 MED ORDER — CETIRIZINE HCL 10 MG PO TABS: 10.0000 mg | ORAL_TABLET | Freq: Every day | ORAL | 11 refills | Status: DC

## 2016-12-16 MED ORDER — ENALAPRIL-HYDROCHLOROTHIAZIDE 10-25 MG PO TABS: 1.0000 | ORAL_TABLET | Freq: Every day | ORAL | 6 refills | Status: DC

## 2016-12-16 NOTE — Addendum Note (Signed)
Addended by: Estill Dooms on: 12/16/2016 05:07 PM   Modules accepted: Orders

## 2016-12-16 NOTE — Progress Notes (Addendum)
Facility  [facility]    Place of Service:   OFFICE    No Known Allergies  Chief Complaint  Patient presents with  . Acute Visit    about one month trouble with sneezing, nasal congestion, eyes itching and watering, getting worse lately. History of asthma, used to ge allergy injections.     HPI:  Relocated to [place] from [place] 5 months ago. She has history of allergies and asthma in the past. She has not used any meds for allergy due to her medcations for HIV. Previously got allergy injections.  Medications: Patient's Medications  New Prescriptions   No medications on file  Previous Medications   DOLUTEGRAVIR (TIVICAY) 50 MG TABLET    Take 1 tablet (50 mg total) by mouth daily.   EMTRICITABINE-RILPIVIR-TENOFOVIR AF (ODEFSEY) 200-25-25 MG TABS TABLET    Take 1 tablet by mouth daily with breakfast.   ENALAPRIL-HYDROCHLOROTHIAZIDE (VASERETIC) 10-25 MG TABLET    Take 1 tablet by mouth daily.   METOPROLOL (LOPRESSOR) 100 MG TABLET    Take 1 tablet (100 mg total) by mouth daily.  Modified Medications   No medications on file  Discontinued Medications   ALENDRONATE (FOSAMAX) 70 MG TABLET    Take 70 mg by mouth once a week. Take with a full glass of water on an empty stomach.    Review of Systems  Constitutional: Negative for activity change, appetite change, chills, diaphoresis, fatigue, fever and unexpected weight change.       HIV positive and under treatment  HENT: Positive for dental problem (wears dentures), postnasal drip and rhinorrhea. Negative for congestion, ear discharge, ear pain, hearing loss, sore throat, tinnitus, trouble swallowing and voice change.   Eyes: Negative for pain, redness, itching and visual disturbance.       Cats ou  Respiratory: Positive for cough. Negative for choking, chest tightness, shortness of breath and wheezing.        Allergies and hx of asthma  Cardiovascular: Negative for chest pain, palpitations and leg swelling.  Gastrointestinal: Negative  for abdominal distention, abdominal pain, constipation, diarrhea and nausea.       Hemorrhoid hx  Endocrine: Negative for cold intolerance, heat intolerance, polydipsia, polyphagia and polyuria.  Genitourinary: Negative for difficulty urinating, dysuria, flank pain, frequency, hematuria, pelvic pain, urgency and vaginal discharge.  Musculoskeletal: Negative for arthralgias, back pain, gait problem, myalgias, neck pain and neck stiffness.  Skin: Negative for color change, pallor and rash.  Allergic/Immunologic: Positive for environmental allergies and immunocompromised state.  Neurological: Negative for dizziness, tremors, seizures, syncope, weakness, numbness and headaches.  Hematological: Negative for adenopathy. Bruises/bleeds easily.  Psychiatric/Behavioral: Negative for agitation, behavioral problems, confusion, dysphoric mood, hallucinations, sleep disturbance and suicidal ideas. The patient is not nervous/anxious and is not hyperactive.   All other systems reviewed and are negative.   Vitals:   12/16/16 1609  BP: 128/80  Pulse: 60  Temp: 98 F (36.7 C)  TempSrc: Oral  SpO2: 98%  Weight: 181 lb (82.1 kg)  Height: _0  (1.727 m)   Body mass index is 27.52 kg/m. Wt Readings from Last 3 Encounters:  12/16/16 181 lb (82.1 kg)  11/12/16 175 lb (79.4 kg)  09/24/16 176 lb 1.9 oz (79.9 kg)      Physical Exam  Constitutional: She is oriented to person, place, and time. She appears well-developed and well-nourished. No distress.  HENT:  Right Ear: External ear normal.  Left Ear: External ear normal.  Nose: Nose normal.  Mouth/Throat: Oropharynx  is clear and moist. No oropharyngeal exudate.  Teary eyes, sniffling, sneezing  Eyes: Conjunctivae and EOM are normal. Pupils are equal, round, and reactive to light. No scleral icterus.  Neck: Neck supple. No JVD present. Carotid bruit is not present. No tracheal deviation present. No thyromegaly present.  Cardiovascular: Normal rate,  regular rhythm, normal heart sounds and intact distal pulses.  Exam reveals no gallop and no friction rub.   No murmur heard. No LE edema b/l. no calf TTP.   Pulmonary/Chest: Effort normal and breath sounds normal. No stridor. No respiratory distress. She has no wheezes. She has no rales. She exhibits no tenderness.  Abdominal: Soft. Bowel sounds are normal. She exhibits no distension and no mass. There is no hepatomegaly. There is no tenderness. There is no rebound and no guarding.  Musculoskeletal: Normal range of motion. She exhibits no edema or tenderness.  Lymphadenopathy:    She has no cervical adenopathy.  Neurological: She is alert and oriented to person, place, and time. No cranial nerve deficit. Coordination normal.  Skin: Skin is warm and dry. No rash noted. She is not diaphoretic. No erythema. No pallor.  Psychiatric: She has a normal mood and affect. Her behavior is normal. Judgment and thought content normal.    Labs reviewed: Lab Summary Latest Ref Rng & Units 09/24/2016 09/09/2016 08/19/2016  Hemoglobin 11.7 - 15.5 g/dL 13.5 (None) 14.0  Hematocrit 35.0 - 45.0 % 40.2 (None) 41.5  White count 3.8 - 10.8 K/uL 5.9 (None) 5.8  Platelet count 140 - 400 K/uL 260 (None) 271  Sodium 135 - 146 mmol/L 137 (None) 140  Potassium 3.5 - 5.3 mmol/L 3.8 (None) 4.0  Calcium 8.6 - 10.4 mg/dL 9.4 (None) 9.3  Phosphorus - (None) (None) (None)  Creatinine 0.50 - 0.99 mg/dL 0.91 (None) 0.94  AST 10 - 35 U/L 23 (None) 21  Alk Phos 33 - 130 U/L 49 (None) 57  Bilirubin 0.2 - 1.2 mg/dL 1.0 (None) 0.9  Glucose 65 - 99 mg/dL 90 (None) 87  Cholesterol <200 mg/dL (None) 184 (None)  HDL cholesterol >50 mg/dL (None) 44(L) (None)  Triglycerides <150 mg/dL (None) 91 (None)  LDL Direct - (None) (None) (None)  LDL Calc <100 mg/dL (None) 122(H) (None)  Total protein 6.1 - 8.1 g/dL 6.8 (None) 6.7  Albumin 3.6 - 5.1 g/dL 3.9 (None) 4.1  Some recent data might be hidden   Lab Results  Component Value  Date   TSH 0.92 09/09/2016   Lab Results  Component Value Date   BUN 13 09/24/2016   BUN 13 08/19/2016   No results found for: HGBA1C  Assessment/Plan  1. Essential hypertension refill - enalapril-hydrochlorothiazide (VASERETIC) 10-25 MG tablet; Take 1 tablet by mouth daily.  Dispense: 30 tablet; Refill: 6  2. Allergic state, initial encounter - cetirizine (ZYRTEC) 10 MG tablet; Take 1 tablet (10 mg total) by mouth daily.  Dispense: 30 tablet; Refill: 11 - predniSONE (STERAPRED UNI-PAK 21 TAB) 5 MG (21) TBPK tablet; 6 tablets day 1, 5 on day 2, 4 on day 3, 3 on day 4, 2 on day 5, and 1 on day 6.  Dispense: 21 tablet; Refill: 0

## 2016-12-31 ENCOUNTER — Ambulatory Visit (INDEPENDENT_AMBULATORY_CARE_PROVIDER_SITE_OTHER): Payer: Medicare Other | Admitting: Nurse Practitioner

## 2016-12-31 ENCOUNTER — Encounter: Payer: Self-pay | Admitting: Nurse Practitioner

## 2016-12-31 VITALS — BP 122/84 | HR 68 | Temp 98.0°F | Resp 17 | Ht 68.0 in | Wt 181.8 lb

## 2016-12-31 DIAGNOSIS — J0110 Acute frontal sinusitis, unspecified: Secondary | ICD-10-CM | POA: Diagnosis not present

## 2016-12-31 DIAGNOSIS — J45909 Unspecified asthma, uncomplicated: Secondary | ICD-10-CM | POA: Diagnosis not present

## 2016-12-31 MED ORDER — FLUTICASONE PROPIONATE 50 MCG/ACT NA SUSP: 2.0000 | Freq: Every day | NASAL | 6 refills | Status: DC

## 2016-12-31 MED ORDER — ALBUTEROL SULFATE HFA 108 (90 BASE) MCG/ACT IN AERS: 2.0000 | INHALATION_SPRAY | Freq: Four times a day (QID) | RESPIRATORY_TRACT | 0 refills | Status: DC | PRN

## 2016-12-31 MED ORDER — AMOXICILLIN-POT CLAVULANATE 875-125 MG PO TABS: 1.0000 | ORAL_TABLET | Freq: Two times a day (BID) | ORAL | 0 refills | Status: DC

## 2016-12-31 MED ORDER — MONTELUKAST SODIUM 10 MG PO TABS: 10.0000 mg | ORAL_TABLET | Freq: Every day | ORAL | 3 refills | Status: DC

## 2016-12-31 NOTE — Progress Notes (Signed)
Careteam: Patient Care Team: [name], DO as PCP - General (Internal Medicine)  Advanced Directive information Does Patient Have a Medical Advance Directive?: No  No Known Allergies  Chief Complaint  Patient presents with  . Acute Visit    Pt has had sinus congestion, drainage, headache, cough x 3 months but has gotten worse in last week.   . Other    Pt states that she cannot use any steriod medications due to trouble weaning off them.      HPI: Patient is a 66 y.o. female seen in the office today for worsening sinus pressure and congestion. Pt was seen by Dr. Nyoka Cowden on 4/4 for similar symptoms in which she was treated with OTC zyrtec and short course of oral prednisone. Pt moved to the area from [place] 6 months ago and has had problems with her asthma and allergies since this move. She has had worsening facial pressure, headaches and SOB with exertion. She is worried that her asthma is being triggered since her moved. She states that she used to be on several inhalers approximately 15-20 years ago, but has not had any problems with her asthma since this time. She does not currently have a prescription for albuterol or other inhalers. She denies fevers, purulent drainage, sore throat, or cough.   Review of Systems:  Review of Systems  Constitutional: Negative for activity change, appetite change, chills, diaphoresis, fatigue, fever and unexpected weight change.  HENT: Positive for congestion, postnasal drip, sinus pain and sinus pressure. Negative for sneezing, sore throat and trouble swallowing.   Respiratory: Positive for shortness of breath and wheezing. Negative for cough, choking, chest tightness and stridor.   Cardiovascular: Negative for chest pain, palpitations and leg swelling.  Gastrointestinal: Negative for diarrhea, nausea and vomiting.  Skin: Negative.   Neurological: Positive for headaches. Negative for dizziness, syncope, weakness and light-headedness.    Psychiatric/Behavioral: Negative.     Past Medical History:  Diagnosis Date  . Asthma   . High blood pressure   . HIV (human immunodeficiency virus infection) ([place])   . Osteoarthritis    Past Surgical History:  Procedure Laterality Date  . Biopsy of Liver    . CATARACT EXTRACTION, BILATERAL    . CHOLECYSTECTOMY    . CYST REMOVAL HAND Left   . LAPAROSCOPIC SALPINGOOPHERECTOMY Right    Social History:   reports that she has quit smoking. Her smoking use included Cigarettes. She has a 40.00 pack-year smoking history. She has never used smokeless tobacco. She reports that she does not drink alcohol or use drugs.  Family History  Problem Relation Age of Onset  . Diabetes Mother     died at age 57  . Hypertension Mother   . Asthma Sister   . Diabetes Sister   . Hypertension Sister   . Arthritis Sister   . Diabetes Sister   . Hypertension Sister   . Asthma Sister   . Arthritis Sister   . Asthma Son     controlled    Medications: Patient's Medications  New Prescriptions   No medications on file  Previous Medications   CETIRIZINE (ZYRTEC) 10 MG TABLET    Take 1 tablet (10 mg total) by mouth daily.   DOLUTEGRAVIR (TIVICAY) 50 MG TABLET    Take 1 tablet (50 mg total) by mouth daily.   EMTRICITABINE-RILPIVIR-TENOFOVIR AF (ODEFSEY) 200-25-25 MG TABS TABLET    Take 1 tablet by mouth daily with breakfast.   ENALAPRIL-HYDROCHLOROTHIAZIDE (VASERETIC) 10-25 MG  TABLET    Take 1 tablet by mouth daily.   METOPROLOL (LOPRESSOR) 100 MG TABLET    Take 1 tablet (100 mg total) by mouth daily.  Modified Medications   No medications on file  Discontinued Medications   PREDNISONE (STERAPRED UNI-PAK 21 TAB) 5 MG (21) TBPK TABLET    6 tablets day 1, 5 on day 2, 4 on day 3, 3 on day 4, 2 on day 5, and 1 on day 6.     Physical Exam:  Vitals:   12/31/16 1456  BP: 122/84  Pulse: 68  Resp: 17  Temp: 98 F (36.7 C)  TempSrc: Oral  SpO2: 97%  Weight: 181 lb 12.8 oz (82.5 kg)  Height:  5\' 8"  (1.727 m)   Body mass index is 27.64 kg/m.  Physical Exam  Constitutional: She appears well-developed and well-nourished. No distress.  HENT:  Head: Normocephalic and atraumatic.  Right Ear: Hearing, tympanic membrane, external ear and ear canal normal.  Left Ear: Hearing, tympanic membrane, external ear and ear canal normal.  Nose: Nose normal.  Mouth/Throat: Uvula is midline and oropharynx is clear and moist.  Facial tenderness, frontal   Pulmonary/Chest: No accessory muscle usage. No respiratory distress. She has wheezes in the left upper field, the left middle field and the left lower field.  Skin: She is not diaphoretic.    Labs reviewed: Basic Metabolic Panel:  Recent Labs  08/19/16 0954 09/09/16 0850 09/24/16 1117  NA 140  --  137  K 4.0  --  3.8  CL 102  --  101  CO2 28  --  30  GLUCOSE 87  --  90  BUN 13  --  13  CREATININE 0.94  --  0.91  CALCIUM 9.3  --  9.4  TSH  --  0.92  --    Liver Function Tests:  Recent Labs  08/19/16 0954 09/24/16 1117  AST 21 23  ALT 15 19  ALKPHOS 57 49  BILITOT 0.9 1.0  PROT 6.7 6.8  ALBUMIN 4.1 3.9   No results for input(s): LIPASE, AMYLASE in the last 8760 hours. No results for input(s): AMMONIA in the last 8760 hours. CBC:  Recent Labs  08/19/16 0954 09/24/16 1117  WBC 5.8 5.9  NEUTROABS 2,610 2,891  HGB 14.0 13.5  HCT 41.5 40.2  MCV 90.4 89.9  PLT 271 260   Lipid Panel:  Recent Labs  09/09/16 0850  CHOL 184  HDL 44*  LDLCALC 122*  TRIG 91  CHOLHDL 4.2   TSH:  Recent Labs  09/09/16 0850  TSH 0.92   A1C: No results found for: HGBA1C   Assessment/Plan 1. Subacute frontal sinusitis -Start fluticasone (FLONASE) 50 MCG/ACT nasal spray; Place 2 sprays into both nostrils daily.  Dispense: 16 g; Refill: 6 - montelukast (SINGULAIR) 10 MG tablet; Take 1 tablet (10 mg total) by mouth at bedtime.  Dispense: 30 tablet; Refill: 3 - amoxicillin-clavulanate (AUGMENTIN) 875-125 MG tablet; Take 1  tablet by mouth 2 (two) times daily.  Dispense: 14 tablet; Refill: 0 -Educated on the use of Netti pot to sooth sinuses  2. Asthma, unspecified asthma severity, unspecified whether complicated, unspecified whether persistent -Pt to monitor for increased wheezing and SOB, call office if no improvement -Start montelukast (SINGULAIR) 10 MG tablet; Take 1 tablet (10 mg total) by mouth at bedtime.  Dispense: 30 tablet; Refill: 3 - albuterol (PROVENTIL HFA;VENTOLIN HFA) 108 (90 Base) MCG/ACT inhaler; Inhale 2 puffs into the lungs every 6 (six)  hours as needed for wheezing or shortness of breath.  Dispense: 1 Inhaler; Refill: 0 -If pt is using albuterol more than twice a day over the next several days, will need to start maintenance inhaler and/or refer to pulmonology for asthma care.  If not better in 3 days with no improvements with wheezing and SOB, call the office or seek medical attention.  Will need to move scheduled 6 week appointment with Dr. Eulas Post up to assess for improvements.    Carlos American. Harle Battiest  [facility] [phone] 8 am - 5 pm) [phone] (after hours)

## 2016-12-31 NOTE — Patient Instructions (Addendum)
Augmentin by mouth twice daily for 7 days Use probiotic twice daily Singulair 10 mg daily   To use flonase 2 sprays into each nare daily Albuterol as needed Follow up in 6 weeks with Dr Eulas Post   Sinusitis, Adult Sinusitis is soreness and inflammation of your sinuses. Sinuses are hollow spaces in the bones around your face. Your sinuses are located:  Around your eyes.  In the middle of your forehead.  Behind your nose.  In your cheekbones. Your sinuses and nasal passages are lined with a stringy fluid (mucus). Mucus normally drains out of your sinuses. When your nasal tissues become inflamed or swollen, the mucus can become trapped or blocked so air cannot flow through your sinuses. This allows bacteria, viruses, and funguses to grow, which leads to infection. Sinusitis can develop quickly and last for 7?10 days (acute) or for more than 12 weeks (chronic). Sinusitis often develops after a cold. What are the causes? This condition is caused by anything that creates swelling in the sinuses or stops mucus from draining, including:  Allergies.  Asthma.  Bacterial or viral infection.  Abnormally shaped bones between the nasal passages.  Nasal growths that contain mucus (nasal polyps).  Narrow sinus openings.  Pollutants, such as chemicals or irritants in the air.  A foreign object stuck in the nose.  A fungal infection. This is rare. What increases the risk? The following factors may make you more likely to develop this condition:  Having allergies or asthma.  Having had a recent cold or respiratory tract infection.  Having structural deformities or blockages in your nose or sinuses.  Having a weak immune system.  Doing a lot of swimming or diving.  Overusing nasal sprays.  Smoking. What are the signs or symptoms? The main symptoms of this condition are pain and a feeling of pressure around the affected sinuses. Other symptoms include:  Upper  toothache.  Earache.  Headache.  Bad breath.  Decreased sense of smell and taste.  A cough that may get worse at night.  Fatigue.  Fever.  Thick drainage from your nose. The drainage is often green and it may contain pus (purulent).  Stuffy nose or congestion.  Postnasal drip. This is when extra mucus collects in the throat or back of the nose.  Swelling and warmth over the affected sinuses.  Sore throat.  Sensitivity to light. How is this diagnosed? This condition is diagnosed based on symptoms, a medical history, and a physical exam. To find out if your condition is acute or chronic, your health care provider may:  Look in your nose for signs of nasal polyps.  Tap over the affected sinus to check for signs of infection.  View the inside of your sinuses using an imaging device that has a light attached (endoscope). If your health care provider suspects that you have chronic sinusitis, you may also:  Be tested for allergies.  Have a sample of mucus taken from your nose (nasal culture) and checked for bacteria.  Have a mucus sample examined to see if your sinusitis is related to an allergy. If your sinusitis does not respond to treatment and it lasts longer than 8 weeks, you may have an MRI or CT scan to check your sinuses. These scans also help to determine how severe your infection is. In rare cases, a bone biopsy may be done to rule out more serious types of fungal sinus disease. How is this treated? Treatment for sinusitis depends on the cause and  whether your condition is chronic or acute. If a virus is causing your sinusitis, your symptoms will go away on their own within 10 days. You may be given medicines to relieve your symptoms, including:  Topical nasal decongestants. They shrink swollen nasal passages and let mucus drain from your sinuses.  Antihistamines. These drugs block inflammation that is triggered by allergies. This can help to ease swelling in your  nose and sinuses.  Topical nasal corticosteroids. These are nasal sprays that ease inflammation and swelling in your nose and sinuses.  Nasal saline washes. These rinses can help to get rid of thick mucus in your nose. If your condition is caused by bacteria, you will be given an antibiotic medicine. If your condition is caused by a fungus, you will be given an antifungal medicine. Surgery may be needed to correct underlying conditions, such as narrow nasal passages. Surgery may also be needed to remove polyps. Follow these instructions at home: Medicines   Take, use, or apply over-the-counter and prescription medicines only as told by your health care provider. These may include nasal sprays.  If you were prescribed an antibiotic medicine, take it as told by your health care provider. Do not stop taking the antibiotic even if you start to feel better. Hydrate and Humidify   Drink enough water to keep your urine clear or pale yellow. Staying hydrated will help to thin your mucus.  Use a cool mist humidifier to keep the humidity level in your home above 50%.  Inhale steam for 10-15 minutes, 3-4 times a day or as told by your health care provider. You can do this in the bathroom while a hot shower is running.  Limit your exposure to cool or dry air. Rest   Rest as much as possible.  Sleep with your head raised (elevated).  Make sure to get enough sleep each night. General instructions   Apply a warm, moist washcloth to your face 3-4 times a day or as told by your health care provider. This will help with discomfort.  Wash your hands often with soap and water to reduce your exposure to viruses and other germs. If soap and water are not available, use hand sanitizer.  Do not smoke. Avoid being around people who are smoking (secondhand smoke).  Keep all follow-up visits as told by your health care provider. This is important. Contact a health care provider if:  You have a  fever.  Your symptoms get worse.  Your symptoms do not improve within 10 days. Get help right away if:  You have a severe headache.  You have persistent vomiting.  You have pain or swelling around your face or eyes.  You have vision problems.  You develop confusion.  Your neck is stiff.  You have trouble breathing. This information is not intended to replace advice given to you by your health care provider. Make sure you discuss any questions you have with your health care provider. Document Released: 08/31/2005 Document Revised: 04/26/2016 Document Reviewed: 06/26/2015 Elsevier Interactive Patient Education  2017 [employer].

## 2017-01-03 ENCOUNTER — Emergency Department (HOSPITAL_BASED_OUTPATIENT_CLINIC_OR_DEPARTMENT_OTHER): Payer: Medicare Other

## 2017-01-03 ENCOUNTER — Emergency Department (HOSPITAL_BASED_OUTPATIENT_CLINIC_OR_DEPARTMENT_OTHER)
Admission: EM | Admit: 2017-01-03 | Discharge: 2017-01-03 | Disposition: A | Discharge status: Home / Self Care | Payer: Medicare Other | Attending: Emergency Medicine | Admitting: Emergency Medicine

## 2017-01-03 ENCOUNTER — Encounter (HOSPITAL_BASED_OUTPATIENT_CLINIC_OR_DEPARTMENT_OTHER): Payer: Self-pay | Admitting: Emergency Medicine

## 2017-01-03 DIAGNOSIS — Z21 Asymptomatic human immunodeficiency virus [HIV] infection status: Secondary | ICD-10-CM | POA: Insufficient documentation

## 2017-01-03 DIAGNOSIS — R0603 Acute respiratory distress: Secondary | ICD-10-CM | POA: Diagnosis not present

## 2017-01-03 DIAGNOSIS — J45901 Unspecified asthma with (acute) exacerbation: Secondary | ICD-10-CM | POA: Diagnosis not present

## 2017-01-03 DIAGNOSIS — I10 Essential (primary) hypertension: Secondary | ICD-10-CM | POA: Diagnosis not present

## 2017-01-03 DIAGNOSIS — Z87891 Personal history of nicotine dependence: Secondary | ICD-10-CM | POA: Insufficient documentation

## 2017-01-03 DIAGNOSIS — R197 Diarrhea, unspecified: Secondary | ICD-10-CM | POA: Insufficient documentation

## 2017-01-03 DIAGNOSIS — Z79899 Other long term (current) drug therapy: Secondary | ICD-10-CM | POA: Diagnosis not present

## 2017-01-03 DIAGNOSIS — R05 Cough: Secondary | ICD-10-CM | POA: Diagnosis present

## 2017-01-03 LAB — COMPREHENSIVE METABOLIC PANEL
ALT: 19 U/L (ref 14–54)
AST: 22 U/L (ref 15–41)
Albumin: 4.1 g/dL (ref 3.5–5.0)
Alkaline Phosphatase: 50 U/L (ref 38–126)
Anion gap: 9 (ref 5–15)
BUN: 12 mg/dL (ref 6–20)
CO2: 32 mmol/L (ref 22–32)
Calcium: 9.6 mg/dL (ref 8.9–10.3)
Chloride: 99 mmol/L — ABNORMAL LOW (ref 101–111)
Creatinine, Ser: 1.16 mg/dL — ABNORMAL HIGH (ref 0.44–1.00)
GFR calc Af Amer: 55 mL/min — ABNORMAL LOW (ref >60)
GFR calc non Af Amer: 48 mL/min — ABNORMAL LOW (ref >60)
Glucose, Bld: 131 mg/dL — ABNORMAL HIGH (ref 65–99)
Potassium: 3.2 mmol/L — ABNORMAL LOW (ref 3.5–5.1)
Sodium: 140 mmol/L (ref 135–145)
Total Bilirubin: 0.8 mg/dL (ref 0.3–1.2)
Total Protein: 7.5 g/dL (ref 6.5–8.1)

## 2017-01-03 LAB — CBC WITH DIFFERENTIAL/PLATELET
Basophils Absolute: 0.1 10*3/uL (ref 0.0–0.1)
Basophils Relative: 1 %
Eosinophils Absolute: 0.5 10*3/uL (ref 0.0–0.7)
Eosinophils Relative: 8 %
HCT: 42.3 % (ref 36.0–46.0)
Hemoglobin: 14.2 g/dL (ref 12.0–15.0)
Lymphocytes Relative: 43 %
Lymphs Abs: 2.9 10*3/uL (ref 0.7–4.0)
MCH: 31.3 pg (ref 26.0–34.0)
MCHC: 33.6 g/dL (ref 30.0–36.0)
MCV: 93.4 fL (ref 78.0–100.0)
Monocytes Absolute: 0.9 10*3/uL (ref 0.1–1.0)
Monocytes Relative: 13 %
Neutro Abs: 2.4 10*3/uL (ref 1.7–7.7)
Neutrophils Relative %: 35 %
Platelets: 233 10*3/uL (ref 150–400)
RBC: 4.53 MIL/uL (ref 3.87–5.11)
RDW: 13.3 % (ref 11.5–15.5)
WBC: 6.8 10*3/uL (ref 4.0–10.5)

## 2017-01-03 LAB — TROPONIN I: Troponin I: <0.03 ng/mL (ref <0.03)

## 2017-01-03 MED ORDER — IPRATROPIUM BROMIDE 0.02 % IN SOLN
0.5000 mg | Freq: Once | RESPIRATORY_TRACT | Status: AC
  Administered 2017-01-03: 0.5 mg via RESPIRATORY_TRACT

## 2017-01-03 MED ORDER — ALBUTEROL (5 MG/ML) CONTINUOUS INHALATION SOLN
10.0000 mg/h | INHALATION_SOLUTION | RESPIRATORY_TRACT | Status: AC
  Administered 2017-01-03: 18:00:00 via RESPIRATORY_TRACT

## 2017-01-03 MED ORDER — MAGNESIUM SULFATE 50 % IJ SOLN
2.0000 g | Freq: Once | INTRAMUSCULAR | Status: DC
  Filled 2017-01-03: qty 4

## 2017-01-03 MED ORDER — MAGNESIUM SULFATE 2 GM/50ML IV SOLN
INTRAVENOUS | Status: AC
  Administered 2017-01-03: 2 g
  Filled 2017-01-03: qty 50

## 2017-01-03 MED ORDER — SODIUM CHLORIDE 0.9 % IV BOLUS (SEPSIS)
500.0000 mL | Freq: Once | INTRAVENOUS | Status: AC
  Administered 2017-01-03: 500 mL via INTRAVENOUS

## 2017-01-03 MED ORDER — ALBUTEROL (5 MG/ML) CONTINUOUS INHALATION SOLN
INHALATION_SOLUTION | RESPIRATORY_TRACT | Status: AC
  Filled 2017-01-03: qty 20

## 2017-01-03 MED ORDER — PREDNISONE 20 MG PO TABS: 40.0000 mg | ORAL_TABLET | Freq: Every day | ORAL | 0 refills | Status: AC

## 2017-01-03 MED ORDER — METHYLPREDNISOLONE SODIUM SUCC 125 MG IJ SOLR
125.0000 mg | Freq: Once | INTRAMUSCULAR | Status: AC
Start: 2017-01-03 — End: 2017-01-03
  Administered 2017-01-03: 125 mg via INTRAVENOUS
  Filled 2017-01-03: qty 2

## 2017-01-03 MED ORDER — IPRATROPIUM BROMIDE 0.02 % IN SOLN
RESPIRATORY_TRACT | Status: AC
  Administered 2017-01-03: 0.5 mg via RESPIRATORY_TRACT
  Filled 2017-01-03: qty 2.5

## 2017-01-03 NOTE — ED Provider Notes (Signed)
Emergency Department Provider Note  By signing my name below, I, Avnee Patel, attest that this documentation has been prepared under the direction and in the presence of Margette Fast, MD  Electronically Signed: Delton Prairie, ED Scribe. 01/03/17. 5:51 PM.  I have reviewed the triage vital signs and the nursing notes.   HISTORY  Chief Complaint Difficulty Breathing   HPI Maria Adkins is a 66 y.o. female, with a PMHx of HIV and asthma acute onset, moderate SOB secondary to an asthma exacerbation beginning 4 days ago. She also reports a productive cough with yellow sputum and diarrhea x today. She has been taking Singulair, Flonase and has had prednisone with no relief. Pt denies fevers or any other associated symptoms. No other complaints noted at this time. No radiation of symptoms. No modifying factors. Symptoms are severe and gradually worsening.   Past Medical History:  Diagnosis Date  . Asthma   . High blood pressure   . HIV (human immunodeficiency virus infection) ([place])   . Osteoarthritis     Patient Active Problem List   Diagnosis Date Noted  . Allergy 12/16/2016  . 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 Essential hypertension 08/19/2016  . Human immunodeficiency virus (HIV) disease ([place]) 08/19/2016    Past Surgical History:  Procedure Laterality Date  . Biopsy of Liver    . CATARACT EXTRACTION, BILATERAL    . CHOLECYSTECTOMY    . CYST REMOVAL HAND Left   . LAPAROSCOPIC SALPINGOOPHERECTOMY Right     Current Outpatient Rx  . Order #: [id number] Class: Normal  . Order #: [id number] Class: Normal  . Order #: [id number] Class: Normal  . Order #: [id number] Class: Normal  . Order #: [id number] Class: Normal  . Order #: [id number] Class: Normal  . Order #: [id number] Class: Normal  . Order #: [id number] Class: Normal  . Order #: [id number] Class: Print    Allergies Patient has no known allergies.  Family History  Problem Relation Age of Onset  . Diabetes Mother    died at age 55  . Hypertension Mother   . Asthma Sister   . Diabetes Sister   . Hypertension Sister   . Arthritis Sister   . Diabetes Sister   . Hypertension Sister   . Asthma Sister   . Arthritis Sister   . Asthma Son     controlled    Social History Social History  Substance Use Topics  . Smoking status: Former Smoker    Packs/day: 1.00    Years: 40.00    Types: Cigarettes  . Smokeless tobacco: Never Used  . Alcohol use No    Review of Systems  Constitutional: No fever/chills Eyes: No visual changes. ENT: No sore throat. Positive runny nose and cough.  Cardiovascular: Denies chest pain. Respiratory: + shortness of breath. Gastrointestinal: No abdominal pain.  No nausea, no vomiting.  +diarrhea.  No constipation. Genitourinary: Negative for dysuria. Musculoskeletal: Negative for back pain. Skin: Negative for rash. Neurological: Negative for headaches, focal weakness or numbness.  10-point ROS otherwise negative.  ____________________________________________   PHYSICAL EXAM:  VITAL SIGNS: ED Triage Vitals  Enc Vitals Group     BP 01/03/17 1727 (!) 137/92     Pulse Rate 01/03/17 1727 70     Resp 01/03/17 1727 (!) 28     Temp 01/03/17 1727 98.7 F (37.1 C)     Temp Source 01/03/17 1727 Oral     SpO2 01/03/17 1727 99 %     Weight 01/03/17 1729 182 lb (82.6 kg)  Height 01/03/17 1729 5\' 7"  (1.702 m)     Pain Score 01/03/17 1727 0   Constitutional: Alert and oriented. Breathing rapidly with some moderate respiratory distress. Speaking in brief sentences with breaks for heavy breathing.  Eyes: Conjunctivae are normal. Head: Atraumatic. Nose: No congestion/rhinnorhea. Mouth/Throat: Mucous membranes are moist.  Neck: No stridor.   Cardiovascular: Normal rate, regular rhythm. Good peripheral circulation. Grossly normal heart sounds.   Respiratory: Increased respiratory effort with tachypnea.  No retractions. Lungs with diffuse end expiratory wheezing.  Symmetrical exam.  Gastrointestinal: Soft and nontender. No distention.  Musculoskeletal: No lower extremity tenderness nor edema. No gross deformities of extremities. Neurologic:  Normal speech and language. No gross focal neurologic deficits are appreciated.  Skin:  Skin is warm, dry and intact. No rash noted.  ____________________________________________   LABS (all labs ordered are listed, but only abnormal results are displayed)  Labs Reviewed  COMPREHENSIVE METABOLIC PANEL - Abnormal; Notable for the following:       Result Value   Potassium 3.2 (*)    Chloride 99 (*)    Glucose, Bld 131 (*)    Creatinine, Ser 1.16 (*)    GFR calc non Af Amer 48 (*)    GFR calc Af Amer 55 (*)    All other components within normal limits  CBC WITH DIFFERENTIAL/PLATELET  TROPONIN I   ____________________________________________  EKG   EKG Interpretation  Date/Time:  Sunday January 03 2017 18:05:24 EDT Ventricular Rate:  74 PR Interval:    QRS Duration: 100 QT Interval:  431 QTC Calculation: 479 R Axis:   -10 Text Interpretation:  Sinus rhythm Prolonged PR interval Probable anteroseptal infarct, old No STEMI.  Confirmed by Nathaniel Yaden MD, Blakely Gluth [phone]) on 01/03/2017 6:09:05 PM       ____________________________________________  RADIOLOGY  Dg Chest Portable 1 View  Result Date: 01/03/2017 CLINICAL DATA:  66 year old female with acute onset of respiratory distress today. No fever. History of COPD. EXAM: PORTABLE CHEST 1 VIEW COMPARISON:  No priors. FINDINGS: Diffuse peribronchial cuffing. Lung volumes are normal. No consolidative airspace disease. No pleural effusions. No pneumothorax. No pulmonary nodule or mass noted. Pulmonary vasculature and the cardiomediastinal silhouette are within normal limits. Atherosclerosis in the thoracic aorta. IMPRESSION: 1. Diffuse peribronchial cuffing, concerning for an acute bronchitis. 2. Aortic atherosclerosis. Electronically Signed   By: Vinnie Langton  M.D.   On: 01/03/2017 18:11    ____________________________________________   PROCEDURES  Procedure(s) performed:   Procedures  CRITICAL CARE Performed by: Margette Fast Total critical care time: 30 minutes Critical care time was exclusive of separately billable procedures and treating other patients. Critical care was necessary to treat or prevent imminent or life-threatening deterioration. Critical care was time spent personally by me on the following activities: development of treatment plan with patient and/or surrogate as well as nursing, discussions with consultants, evaluation of patient's response to treatment, examination of patient, obtaining history from patient or surrogate, ordering and performing treatments and interventions, ordering and review of laboratory studies, ordering and review of radiographic studies, pulse oximetry and re-evaluation of patient's condition.  Nanda Quinton, MD Emergency Medicine  ____________________________________________   INITIAL IMPRESSION / ASSESSMENT AND PLAN / ED COURSE  Pertinent labs & imaging results that were available during my care of the patient were reviewed by me and considered in my medical decision making (see chart for details).  Patient presents to the ED for evaluation of moderate respiratory distress with a history of asthma and prior hospital admissions  for similar. No CP. No fever or chills. Patient given albuterol neb initially and then quickly transitioned to continuous albuterol neb. IV access established and padded Solumedrol and Magnesium along with small IVF bolus.   07:00 PM CAT complete. Patient reports feeling better during treatment. No hypoxemia. No CP. EKG and labs are unremarkable. Plan for observe in the ED after CAT completion and make decision on disposition at that time. Patient and husband at bedside updated.   08:55 PM Patient is feeling much better. Plan for discharge with steroid burst, albuterol,  and PCP follow up in the coming week.   At this time, I do not feel there is any life-threatening condition present. I have reviewed and discussed all results (EKG, imaging, lab, urine as appropriate), exam findings with patient. I have reviewed nursing notes and appropriate previous records.  I feel the patient is safe to be discharged home without further emergent workup. Discussed usual and customary return precautions. Patient and family (if present) verbalize understanding and are comfortable with this plan.  Patient will follow-up with their primary care provider. If they do not have a primary care provider, information for follow-up has been provided to them. All questions have been answered.  ____________________________________________  FINAL CLINICAL IMPRESSION(S) / ED DIAGNOSES  Final diagnoses:  Moderate asthma with exacerbation, unspecified whether persistent     MEDICATIONS GIVEN DURING THIS VISIT:  Medications  albuterol (PROVENTIL,VENTOLIN) solution continuous neb (0 mg/hr Nebulization Stopped 01/03/17 1834)  ipratropium (ATROVENT) nebulizer solution 0.5 mg (0.5 mg Nebulization Given 01/03/17 1735)  methylPREDNISolone sodium succinate (SOLU-MEDROL) 125 mg/2 mL injection 125 mg (125 mg Intravenous Given 01/03/17 1811)  sodium chloride 0.9 % bolus 500 mL (0 mLs Intravenous Stopped 01/03/17 1843)  magnesium sulfate 2 GM/50ML IVPB (2 g  New Bag/Given 01/03/17 1851)     NEW OUTPATIENT MEDICATIONS STARTED DURING THIS VISIT:  Discharge Medication List as of 01/03/2017  8:57 PM    START taking these medications   Details  predniSONE (DELTASONE) 20 MG tablet Take 2 tablets (40 mg total) by mouth daily., Starting Mon 01/04/2017, Until Fri 01/08/2017, Print       Albuterol inh   Note:  This document was prepared using Dragon voice recognition software and may include unintentional dictation errors.  Nanda Quinton, MD Emergency Medicine  I personally performed the services  described in this documentation, which was scribed in my presence. The recorded information has been reviewed and is accurate.       Margette Fast, MD 01/04/17 1258

## 2017-01-03 NOTE — ED Notes (Signed)
Pt states she has been [facility] for a couple of days. Tried all home treatments, but no relief. Also reports loose stools today, which have been dark. Richardson Landry, RRT with pt on arrival and administered treatments. Pt states she feels much better at present. Breathing is less labored. Continues to have some expiratory wheezing. PCXR and EKG being done.

## 2017-01-03 NOTE — ED Triage Notes (Signed)
Has been SOB since last Wednesday, worse today, with wheezing.

## 2017-01-03 NOTE — ED Notes (Signed)
Richardson Landry, RRT at bedside administering tx.

## 2017-01-03 NOTE — Discharge Instructions (Signed)
We believe that your symptoms are caused today by an exacerbation of your asthma.  Please take the prescribed medications and any medications that you have at home.  Follow up with your doctor as recommended.  If you develop any new or worsening symptoms, including but not limited to fever, persistent vomiting, worsening shortness of breath, or other symptoms that concern you, please return to the Emergency Department immediately. ° ° °Asthma °Asthma is a recurring condition in which the airways tighten and narrow. Asthma can make it difficult to breathe. It can cause coughing, wheezing, and shortness of breath. Asthma episodes, also called asthma attacks, range from minor to life-threatening. Asthma cannot be cured, but medicines and lifestyle changes can help control it. °CAUSES °Asthma is believed to be caused by inherited (genetic) and environmental factors, but its exact cause is unknown. Asthma may be triggered by allergens, lung infections, or irritants in the air. Asthma triggers are different for each person. Common triggers include:  °Animal dander. °Dust mites. °Cockroaches. °Pollen from trees or grass. °Mold. °Smoke. °Air pollutants such as dust, household cleaners, hair sprays, aerosol sprays, paint fumes, strong chemicals, or strong odors. °Cold air, weather changes, and winds (which increase molds and pollens in the air). °Strong emotional expressions such as crying or laughing hard. °Stress. °Certain medicines (such as aspirin) or types of drugs (such as beta-blockers). °Sulfites in foods and drinks. Foods and drinks that may contain sulfites include dried fruit, potato chips, and sparkling grape juice. °Infections or inflammatory conditions such as the flu, a cold, or an inflammation of the nasal membranes (rhinitis). °Gastroesophageal reflux disease (GERD). °Exercise or strenuous activity. °SYMPTOMS °Symptoms may occur immediately after asthma is triggered or many hours later. Symptoms  include: °Wheezing. °Excessive nighttime or early morning coughing. °Frequent or severe coughing with a common cold. °Chest tightness. °Shortness of breath. °DIAGNOSIS  °The diagnosis of asthma is made by a review of your medical history and a physical exam. Tests may also be performed. These may include: °Lung function studies. These tests show how much air you breathe in and out. °Allergy tests. °Imaging tests such as X-rays. °TREATMENT  °Asthma cannot be cured, but it can usually be controlled. Treatment involves identifying and avoiding your asthma triggers. It also involves medicines. There are 2 classes of medicine used for asthma treatment:  °Controller medicines. These prevent asthma symptoms from occurring. They are usually taken every day. °Reliever or rescue medicines. These quickly relieve asthma symptoms. They are used as needed and provide short-term relief. °Your health care provider will help you create an asthma action plan. An asthma action plan is a written plan for managing and treating your asthma attacks. It includes a list of your asthma triggers and how they may be avoided. It also includes information on when medicines should be taken and when their dosage should be changed. An action plan may also involve the use of a device called a peak flow meter. A peak flow meter measures how well the lungs are working. It helps you monitor your condition. °HOME CARE INSTRUCTIONS  °Take medicines only as directed by your health care provider. Speak with your health care provider if you have questions about how or when to take the medicines. °Use a peak flow meter as directed by your health care provider. Record and keep track of readings. °Understand and use the action plan to help minimize or stop an asthma attack without needing to seek medical care. °Control your home environment in the following   ways to help prevent asthma attacks: °Do not smoke. Avoid being exposed to secondhand smoke. °Change  your heating and air conditioning filter regularly. °Limit your use of fireplaces and wood stoves. °Get rid of pests (such as roaches and mice) and their droppings. °Throw away plants if you see mold on them. °Clean your floors and dust regularly. Use unscented cleaning products. °Try to have someone else vacuum for you regularly. Stay out of rooms while they are being vacuumed and for a short while afterward. If you vacuum, use a dust mask from a hardware store, a double-layered or microfilter vacuum cleaner bag, or a vacuum cleaner with a HEPA filter. °Replace carpet with wood, tile, or vinyl flooring. Carpet can trap dander and dust. °Use allergy-proof pillows, mattress covers, and box spring covers. °Wash bed sheets and blankets every week in hot water and dry them in a dryer. °Use blankets that are made of polyester or cotton. °Clean bathrooms and kitchens with bleach. If possible, have someone repaint the walls in these rooms with mold-resistant paint. Keep out of the rooms that are being cleaned and painted. °Wash hands frequently. °SEEK MEDICAL CARE IF:  °You have wheezing, shortness of breath, or a cough even if taking medicine to prevent attacks. °The colored mucus you cough up (sputum) is thicker than usual. °Your sputum changes from clear or white to yellow, green, gray, or bloody. °You have any problems that may be related to the medicines you are taking (such as a rash, itching, swelling, or trouble breathing). °You are using a reliever medicine more than 2-3 times per week. °Your peak flow is still at 50-79% of your personal best after following your action plan for 1 hour. °You have a fever. °SEEK IMMEDIATE MEDICAL CARE IF:  °You seem to be getting worse and are unresponsive to treatment during an asthma attack. °You are short of breath even at rest. °You get short of breath when doing very little physical activity. °You have difficulty eating, drinking, or talking due to asthma symptoms. °You  develop chest pain. °You develop a fast heartbeat. °You have a bluish color to your lips or fingernails. °You are light-headed, dizzy, or faint. °Your peak flow is less than 50% of your personal best. °MAKE SURE YOU:  °Understand these instructions. °Will watch your condition. °Will get help right away if you are not doing well or get worse. °Document Released: 08/31/2005 Document Revised: 01/15/2014 Document Reviewed: 03/30/2013 °[employer]® Patient Information ©2015 [employer]. This information is not intended to replace advice given to you by your health care provider. Make sure you discuss any questions you have with your health care provider. ° °How to Use a Nebulizer °If you have asthma or other breathing problems, you might need to breathe in (inhale) medicine. This can be done with a nebulizer. A nebulizer is a device that turns liquid medicine into a mist that you can inhale.  °There are different kinds of nebulizers. Most are small. With some, you breathe in through a mouthpiece. With others, a mask fits over your nose and mouth. Most nebulizers must be connected to a small air compressor. Air is forced through tubing from the compressor to the nebulizer. The forced air changes the liquid into a fine spray. °RISKS AND COMPLICATIONS °The nebulizer must work properly for it to help your breathing. If the nebulizer does not produce mist, or if foam comes out, this indicates that the nebulizer is not working properly. Sometimes a filter can get clogged, or   there might be a problem with the air compressor. Check the instruction booklet that came with your nebulizer. It should tell you how to fix problems or where to call for help. You should have at least one extra nebulizer at home. That way, you will always have one when you need it.  °HOW TO PREPARE BEFORE USING THE NEBULIZER °Take these steps before using the nebulizer: °Check your medicine. Make sure it has not expired and is not damaged in any way.    °Wash your hands with soap and water.   °Put all the parts of your nebulizer on a sturdy, flat surface. Make sure the tubing connects the compressor and the nebulizer. °Measure the liquid medicine according to your health care provider's instructions. Pour it into the nebulizer. °Attach the mouthpiece or mask.   °Test the nebulizer by turning it on to make sure a spray is coming out. Then, turn it off.   °HOW TO USE THE NEBULIZER °Sit down and focus on staying relaxed.   °If your nebulizer has a mask, put it over your nose and mouth. If you use a mouthpiece, put it in your mouth. Press your lips firmly around the mouthpiece. °Turn on the nebulizer.   °Breathe out.   °Some nebulizers have a finger valve. If yours does, cover up the air hole so the air gets to the nebulizer. °Once the medicine begins to mist out, take slow, deep breaths. If there is a finger valve, release it at the end of your breath. °Continue taking slow, deep breaths until the nebulizer is empty.   °Be sure to stop the machine at any point if you start coughing or if the medicine foams or bubbles. °HOW TO CLEAN THE NEBULIZER  °The nebulizer and all its parts must be kept very clean. Follow the manufacturer's instructions for cleaning. For most nebulizers, you should follow these guidelines: °Wash the nebulizer after each use. Use warm water and soap. Rinse it well. Shake the nebulizer to remove extra water. Put it on a clean towel until it is completely dry. To make sure it is dry, put the nebulizer back together. Turn on the compressor for a few minutes. This will blow air through the nebulizer.   °Do not wash the tubing or the finger valve.   °Store the nebulizer in a dust-free place.   °Inspect the filter every week. Replace it any time it looks dirty.   °Sometimes the nebulizer will need a more complete cleaning. The instruction booklet should say how often you need to do this. °SEEK MEDICAL CARE IF:  °You continue to have difficulty  breathing.   °You have trouble using the nebulizer.   °Document Released: 08/19/2009 Document Revised: 01/15/2014 Document Reviewed: 02/20/2013 °[employer]® Patient Information ©2015 [employer]. This information is not intended to replace advice given to you by your health care provider. Make sure you discuss any questions you have with your health care provider. ° °How to Use an Inhaler °Proper inhaler technique is very important. Good technique ensures that the medicine reaches the lungs. Poor technique results in depositing the medicine on the tongue and back of the throat rather than in the airways. If you do not use the inhaler with good technique, the medicine will not help you. °STEPS TO FOLLOW IF USING AN INHALER WITHOUT AN EXTENSION TUBE °Remove the cap from the inhaler. °If you are using the inhaler for the first time, you will need to prime it. Shake the inhaler for   5 seconds and release four puffs into the air, away from your face. Ask your health care provider or pharmacist if you have questions about priming your inhaler. °Shake the inhaler for 5 seconds before each breath in (inhalation). °Position the inhaler so that the top of the canister faces up. °Put your index finger on the top of the medicine canister. Your thumb supports the bottom of the inhaler. °Open your mouth. °Either place the inhaler between your teeth and place your lips tightly around the mouthpiece, or hold the inhaler 1-2 inches away from your open mouth. If you are unsure of which technique to use, ask your health care provider. °Breathe out (exhale) normally and as completely as possible. °Press the canister down with your index finger to release the medicine. °At the same time as the canister is pressed, inhale deeply and slowly until your lungs are completely filled. This should take 4-6 seconds. Keep your tongue down. °Hold the medicine in your lungs for 5-10 seconds (10 seconds is best). This helps the medicine get into the  small airways of your lungs. °Breathe out slowly, through pursed lips. Whistling is an example of pursed lips. °Wait at least 15-30 seconds between puffs. Continue with the above steps until you have taken the number of puffs your health care provider has ordered. Do not use the inhaler more than your health care provider tells you. °Replace the cap on the inhaler. °Follow the directions from your health care provider or the inhaler insert for cleaning the inhaler. °STEPS TO FOLLOW IF USING AN INHALER WITH AN EXTENSION (SPACER) °Remove the cap from the inhaler. °If you are using the inhaler for the first time, you will need to prime it. Shake the inhaler for 5 seconds and release four puffs into the air, away from your face. Ask your health care provider or pharmacist if you have questions about priming your inhaler. °Shake the inhaler for 5 seconds before each breath in (inhalation). °Place the open end of the spacer onto the mouthpiece of the inhaler. °Position the inhaler so that the top of the canister faces up and the spacer mouthpiece faces you. °Put your index finger on the top of the medicine canister. Your thumb supports the bottom of the inhaler and the spacer. °Breathe out (exhale) normally and as completely as possible. °Immediately after exhaling, place the spacer between your teeth and into your mouth. Close your lips tightly around the spacer. °Press the canister down with your index finger to release the medicine. °At the same time as the canister is pressed, inhale deeply and slowly until your lungs are completely filled. This should take 4-6 seconds. Keep your tongue down and out of the way. °Hold the medicine in your lungs for 5-10 seconds (10 seconds is best). This helps the medicine get into the small airways of your lungs. Exhale. °Repeat inhaling deeply through the spacer mouthpiece. Again hold that breath for up to 10 seconds (10 seconds is best). Exhale slowly. If it is difficult to take  this second deep breath through the spacer, breathe normally several times through the spacer. Remove the spacer from your mouth. °Wait at least 15-30 seconds between puffs. Continue with the above steps until you have taken the number of puffs your health care provider has ordered. Do not use the inhaler more than your health care provider tells you. °Remove the spacer from the inhaler, and place the cap on the inhaler. °Follow the directions from your health care provider   or the inhaler insert for cleaning the inhaler and spacer. °If you are using different kinds of inhalers, use your quick relief medicine to open the airways 10-15 minutes before using a steroid if instructed to do so by your health care provider. If you are unsure which inhalers to use and the order of using them, ask your health care provider, nurse, or respiratory therapist. °If you are using a steroid inhaler, always rinse your mouth with water after your last puff, then gargle and spit out the water. Do not swallow the water. °AVOID: °Inhaling before or after starting the spray of medicine. It takes practice to coordinate your breathing with triggering the spray. °Inhaling through the nose (rather than the mouth) when triggering the spray. °HOW TO DETERMINE IF YOUR INHALER IS FULL OR NEARLY EMPTY °You cannot know when an inhaler is empty by shaking it. A few inhalers are now being made with dose counters. Ask your health care provider for a prescription that has a dose counter if you feel you need that extra help. If your inhaler does not have a counter, ask your health care provider to help you determine the date you need to refill your inhaler. Write the refill date on a calendar or your inhaler canister. Refill your inhaler 7-10 days before it runs out. Be sure to keep an adequate supply of medicine. This includes making sure it is not expired, and that you have a spare inhaler.  °SEEK MEDICAL CARE IF:  °Your symptoms are only partially  relieved with your inhaler. °You are having trouble using your inhaler. °You have some increase in phlegm. °SEEK IMMEDIATE MEDICAL CARE IF:  °You feel little or no relief with your inhalers. You are still wheezing and are feeling shortness of breath or tightness in your chest or both. °You have dizziness, headaches, or a fast heart rate. °You have chills, fever, or night sweats. °You have a noticeable increase in phlegm production, or there is blood in the phlegm. °MAKE SURE YOU:  °Understand these instructions. °Will watch your condition. °Will get help right away if you are not doing well or get worse. °Document Released: 08/28/2000 Document Revised: 06/21/2013 Document Reviewed: 03/30/2013 °[employer]® Patient Information ©2015 [employer]. This information is not intended to replace advice given to you by your health care provider. Make sure you discuss any questions you have with your health care provider. ° ° ° °

## 2017-01-03 NOTE — ED Notes (Signed)
Patient on cardiac monitor for continuous NEB.

## 2017-01-04 ENCOUNTER — Other Ambulatory Visit: Payer: Self-pay | Admitting: Internal Medicine

## 2017-01-04 ENCOUNTER — Telehealth: Payer: Self-pay

## 2017-01-04 DIAGNOSIS — R197 Diarrhea, unspecified: Secondary | ICD-10-CM

## 2017-01-04 DIAGNOSIS — K921 Melena: Secondary | ICD-10-CM | POA: Diagnosis not present

## 2017-01-04 NOTE — Telephone Encounter (Signed)
Needs stool check for blood and c. dificile toxin assay. May use Imodium 2 g with each loose stool up to 8 tablets in a 24 hour time period. This is available OTC.

## 2017-01-04 NOTE — Telephone Encounter (Signed)
Patient called and stated that she had to go to ED on 01/04/12 due to asthma exacerbation. Patient has scheduled an ED follow up for 01/11/17, however, patient states that she has had dark, gritty diarrhea for 2 days. This was addressed while at ED but patient was not given any instruction as to how to deal with it.    Patient would like to know what she should do about the dark, gritty diarrhea. Please advise.

## 2017-01-04 NOTE — Telephone Encounter (Signed)
Just the card test should be adequate to check for blood.

## 2017-01-04 NOTE — Telephone Encounter (Signed)
I spoke with patient and she stated that she would come by the office to get containers for stool specimen collection. Orders have been placed for IFOB and c diff toxin.   Instructions for proper collection of stool specimens will be given to patient when she picks up container.   Patient verbalized understanding of the use of imodium.

## 2017-01-04 NOTE — Telephone Encounter (Signed)
Do you want a stool comprehensive test (container) or IFOB (card) to check for blood?

## 2017-01-05 ENCOUNTER — Other Ambulatory Visit: Payer: Medicare Other

## 2017-01-05 DIAGNOSIS — R197 Diarrhea, unspecified: Secondary | ICD-10-CM

## 2017-01-06 LAB — C. DIFFICILE GDH AND TOXIN A/B
C. difficile GDH: NOT DETECTED
C. difficile Toxin A/B: NOT DETECTED

## 2017-01-07 LAB — FECAL GLOBIN BY IMMUNOCHEM,MEDICARE: Fecal Globin Immuno: NOT DETECTED

## 2017-01-11 ENCOUNTER — Encounter: Payer: Self-pay | Admitting: Nurse Practitioner

## 2017-01-11 ENCOUNTER — Ambulatory Visit (INDEPENDENT_AMBULATORY_CARE_PROVIDER_SITE_OTHER): Payer: Medicare Other | Admitting: Nurse Practitioner

## 2017-01-11 VITALS — BP 122/78 | HR 80 | Temp 98.2°F | Resp 17 | Ht 67.0 in | Wt 177.6 lb

## 2017-01-11 DIAGNOSIS — B373 Candidiasis of vulva and vagina: Secondary | ICD-10-CM

## 2017-01-11 DIAGNOSIS — B3731 Acute candidiasis of vulva and vagina: Secondary | ICD-10-CM

## 2017-01-11 DIAGNOSIS — R197 Diarrhea, unspecified: Secondary | ICD-10-CM | POA: Diagnosis not present

## 2017-01-11 DIAGNOSIS — J4541 Moderate persistent asthma with (acute) exacerbation: Secondary | ICD-10-CM | POA: Diagnosis not present

## 2017-01-11 MED ORDER — FLUTICASONE-SALMETEROL 100-50 MCG/DOSE IN AEPB: 1.0000 | INHALATION_SPRAY | Freq: Two times a day (BID) | RESPIRATORY_TRACT | 3 refills | Status: DC

## 2017-01-11 NOTE — Patient Instructions (Addendum)
Cont Singulair daily Start Advair twice daily for maintenance but cont to use albuterol AS NEEDED for shortness of breath and wheezing.  Will refer you to pulmonary so you will be established with one in town.

## 2017-01-11 NOTE — Progress Notes (Signed)
Careteam: Patient Care Team: Maria Cranker, DO as PCP - General (Internal Medicine)  Advanced Directive information Does Patient Have a Medical Advance Directive?: No  No Known Allergies  Chief Complaint  Patient presents with  . Follow-up    Pt is being seen for a follow up due to visit to ED on 01/03/17 for moderate asthma with exacerbation.      HPI: Patient is a 66 y.o. female seen in the office today to follow up ED visit. Pt with a  PMHx of HIV and asthma acute onset, pt had a moderate SOB secondary to an asthma exacerbation with productive cough with yellow sputum and diarrhea. She has been taking Singulair, Flonase and has had prednisone with no relief therefore went to ED. She had denied fevers or any other associated symptoms. No other complaints noted at this time. No radiation of symptoms. No modifying factors. Symptoms are severe and gradually worsening.  She was given albuterol/Atrovent nebulizer with solu-medrol and steroid taper. Completed taper. Still taking Singulair and Flonase  Still having a little wheezing and conts to use albuterol daily Pt was seeing pulmonary prior to moving to [place]. Previously in [place], been in [place] 7 months. This is her first spring here. Has been on many inhalers over the years. Has been on Advair and Symbicort in the past.  No current shortness of breath or fever at this time. Has some shortness of breath at times. Will use her albuterol daily which helps.  Had diarrhea with antibiotic but this has stopped Reports vaginally itching at this time. Sinusitis has resolved.   Review of Systems:  Review of Systems  Constitutional: Negative for activity change, appetite change, chills, diaphoresis, fatigue, fever and unexpected weight change.       HIV positive and under treatment  HENT: Positive for dental problem (wears dentures), postnasal drip and rhinorrhea. Negative for congestion, ear discharge, ear pain, hearing loss,  sore throat, tinnitus, trouble swallowing and voice change.   Eyes: Negative for pain, redness, itching and visual disturbance.       Cats ou  Respiratory: Positive for wheezing. Negative for cough, choking, chest tightness and shortness of breath.        Allergies and hx of asthma  Cardiovascular: Negative for chest pain, palpitations and leg swelling.  Gastrointestinal: Negative for abdominal distention, abdominal pain, constipation, diarrhea and nausea.       Hemorrhoid hx  Endocrine: Negative for cold intolerance, heat intolerance, polydipsia, polyphagia and polyuria.  Genitourinary: Negative for difficulty urinating, dysuria, flank pain, frequency, hematuria, pelvic pain, urgency and vaginal discharge.  Musculoskeletal: Negative for arthralgias, back pain, gait problem, myalgias, neck pain and neck stiffness.  Skin: Negative for color change, pallor and rash.  Allergic/Immunologic: Positive for environmental allergies and immunocompromised state.  Neurological: Negative for dizziness, tremors, seizures, syncope, weakness, numbness and headaches.  Hematological: Negative for adenopathy. Bruises/bleeds easily.  Psychiatric/Behavioral: Negative for agitation, behavioral problems, confusion, dysphoric mood, hallucinations, sleep disturbance and suicidal ideas. The patient is not nervous/anxious and is not hyperactive.   All other systems reviewed and are negative.   Past Medical History:  Diagnosis Date  . Asthma   . High blood pressure   . HIV (human immunodeficiency virus infection) ([place])   . Osteoarthritis    Past Surgical History:  Procedure Laterality Date  . Biopsy of Liver    . CATARACT EXTRACTION, BILATERAL    . CHOLECYSTECTOMY    . CYST REMOVAL HAND Left   .  LAPAROSCOPIC SALPINGOOPHERECTOMY Right    Social History:   reports that she has quit smoking. Her smoking use included Cigarettes. She has a 40.00 pack-year smoking history. She has never used smokeless tobacco. She  reports that she does not drink alcohol or use drugs.  Family History  Problem Relation Age of Onset  . Diabetes Mother     died at age 31  . Hypertension Mother   . Asthma Sister   . Diabetes Sister   . Hypertension Sister   . Arthritis Sister   . Diabetes Sister   . Hypertension Sister   . Asthma Sister   . Arthritis Sister   . Asthma Son     controlled    Medications: Patient's Medications  New Prescriptions   No medications on file  Previous Medications   ALBUTEROL (PROVENTIL HFA;VENTOLIN HFA) 108 (90 BASE) MCG/ACT INHALER    Inhale 2 puffs into the lungs every 6 (six) hours as needed for wheezing or shortness of breath.   DOLUTEGRAVIR (TIVICAY) 50 MG TABLET    Take 1 tablet (50 mg total) by mouth daily.   EMTRICITABINE-RILPIVIR-TENOFOVIR AF (ODEFSEY) 200-25-25 MG TABS TABLET    Take 1 tablet by mouth daily with breakfast.   ENALAPRIL-HYDROCHLOROTHIAZIDE (VASERETIC) 10-25 MG TABLET    Take 1 tablet by mouth daily.   FLUTICASONE (FLONASE) 50 MCG/ACT NASAL SPRAY    Place 2 sprays into both nostrils daily.   METOPROLOL (LOPRESSOR) 100 MG TABLET    Take 1 tablet (100 mg total) by mouth daily.   MONTELUKAST (SINGULAIR) 10 MG TABLET    Take 1 tablet (10 mg total) by mouth at bedtime.  Modified Medications   No medications on file  Discontinued Medications   AMOXICILLIN-CLAVULANATE (AUGMENTIN) 875-125 MG TABLET    Take 1 tablet by mouth 2 (two) times daily.     Physical Exam:  Vitals:   01/11/17 1144  BP: 122/78  Pulse: 80  Resp: 17  Temp: 98.2 F (36.8 C)  TempSrc: Oral  SpO2: 95%  Weight: 177 lb 9.6 oz (80.6 kg)  Height: 5\' 7"  (1.702 m)   Body mass index is 27.82 kg/m.  Physical Exam  Constitutional: She is oriented to person, place, and time. She appears well-developed and well-nourished. No distress.  HENT:  Nose: Nose normal.  Mouth/Throat: Oropharynx is clear and moist.  Eyes: Conjunctivae and EOM are normal. Pupils are equal, round, and reactive to  light. No scleral icterus.  Neck: Neck supple. Carotid bruit is not present.  Cardiovascular: Normal rate, regular rhythm and normal heart sounds.   Pulmonary/Chest: Effort normal and breath sounds normal. No respiratory distress. She has no wheezes.  Abdominal: Soft. Bowel sounds are normal. She exhibits no distension. There is no hepatomegaly.  Musculoskeletal: Normal range of motion. She exhibits no edema.  Neurological: She is alert and oriented to person, place, and time.  Skin: Skin is warm and dry. She is not diaphoretic.  Psychiatric: She has a normal mood and affect. Her behavior is normal. Judgment and thought content normal.    Labs reviewed: Basic Metabolic Panel:  Recent Labs  08/19/16 0954 09/09/16 0850 09/24/16 1117 01/03/17 1730  NA 140  --  137 140  K 4.0  --  3.8 3.2*  CL 102  --  101 99*  CO2 28  --  30 32  GLUCOSE 87  --  90 131*  BUN 13  --  13 12  CREATININE 0.94  --  0.91 1.16*  CALCIUM  9.3  --  9.4 9.6  TSH  --  0.92  --   --    Liver Function Tests:  Recent Labs  08/19/16 0954 09/24/16 1117 01/03/17 1730  AST 21 23 22   ALT 15 19 19   ALKPHOS 57 49 50  BILITOT 0.9 1.0 0.8  PROT 6.7 6.8 7.5  ALBUMIN 4.1 3.9 4.1   No results for input(s): LIPASE, AMYLASE in the last 8760 hours. No results for input(s): AMMONIA in the last 8760 hours. CBC:  Recent Labs  08/19/16 0954 09/24/16 1117 01/03/17 1730  WBC 5.8 5.9 6.8  NEUTROABS 2,610 2,891 2.4  HGB 14.0 13.5 14.2  HCT 41.5 40.2 42.3  MCV 90.4 89.9 93.4  PLT 271 260 233   Lipid Panel:  Recent Labs  09/09/16 0850  CHOL 184  HDL 44*  LDLCALC 122*  TRIG 91  CHOLHDL 4.2   TSH:  Recent Labs  09/09/16 0850  TSH 0.92   A1C: No results found for: HGBA1C   Assessment/Plan 1. Moderate persistent asthma with acute exacerbation -using albuterol 4 times daily due to shortness of breath and wheezing, not wheezing in office today.  Has been on multiple inhalers in the past but were  able stop due to no symptoms. Will start advair at this time and refer to pulmonary to establish care in [place].  - Fluticasone-Salmeterol (ADVAIR) 100-50 MCG/DOSE AEPB; Inhale 1 puff into the lungs 2 (two) times daily.  Dispense: 1 each; Refill: 3 - Ambulatory referral to Pulmonology  2. Vaginal yeast infection After antibiotics, to use monoSTAT OTC, notify if symptoms persist after treatment.   3. Diarrhea, unspecified type Resolved.   Maria Adkins. Harle Battiest  [facility] [phone] 8 am - 5 pm) [phone] (after hours)

## 2017-01-13 ENCOUNTER — Emergency Department (HOSPITAL_BASED_OUTPATIENT_CLINIC_OR_DEPARTMENT_OTHER): Payer: Medicare Other

## 2017-01-13 ENCOUNTER — Encounter (HOSPITAL_BASED_OUTPATIENT_CLINIC_OR_DEPARTMENT_OTHER): Payer: Self-pay | Admitting: *Deleted

## 2017-01-13 ENCOUNTER — Emergency Department (HOSPITAL_BASED_OUTPATIENT_CLINIC_OR_DEPARTMENT_OTHER)
Admission: EM | Admit: 2017-01-13 | Discharge: 2017-01-13 | Disposition: A | Discharge status: Home / Self Care | Payer: Medicare Other | Attending: Emergency Medicine | Admitting: Emergency Medicine

## 2017-01-13 DIAGNOSIS — J45909 Unspecified asthma, uncomplicated: Secondary | ICD-10-CM | POA: Diagnosis not present

## 2017-01-13 DIAGNOSIS — R102 Pelvic and perineal pain unspecified side: Secondary | ICD-10-CM

## 2017-01-13 DIAGNOSIS — Z79899 Other long term (current) drug therapy: Secondary | ICD-10-CM | POA: Diagnosis not present

## 2017-01-13 DIAGNOSIS — Z87891 Personal history of nicotine dependence: Secondary | ICD-10-CM | POA: Diagnosis not present

## 2017-01-13 DIAGNOSIS — Z21 Asymptomatic human immunodeficiency virus [HIV] infection status: Secondary | ICD-10-CM | POA: Diagnosis not present

## 2017-01-13 DIAGNOSIS — N3000 Acute cystitis without hematuria: Secondary | ICD-10-CM

## 2017-01-13 DIAGNOSIS — D259 Leiomyoma of uterus, unspecified: Secondary | ICD-10-CM | POA: Diagnosis not present

## 2017-01-13 DIAGNOSIS — I10 Essential (primary) hypertension: Secondary | ICD-10-CM | POA: Insufficient documentation

## 2017-01-13 LAB — CBC WITH DIFFERENTIAL/PLATELET
Basophils Absolute: 0 10*3/uL (ref 0.0–0.1)
Basophils Relative: 0 %
Eosinophils Absolute: 0.2 10*3/uL (ref 0.0–0.7)
Eosinophils Relative: 1 %
HCT: 43.3 % (ref 36.0–46.0)
Hemoglobin: 14.6 g/dL (ref 12.0–15.0)
Lymphocytes Relative: 12 %
Lymphs Abs: 2 10*3/uL (ref 0.7–4.0)
MCH: 31.6 pg (ref 26.0–34.0)
MCHC: 33.7 g/dL (ref 30.0–36.0)
MCV: 93.7 fL (ref 78.0–100.0)
Monocytes Absolute: 2.1 10*3/uL — ABNORMAL HIGH (ref 0.1–1.0)
Monocytes Relative: 13 %
Neutro Abs: 12.1 10*3/uL — ABNORMAL HIGH (ref 1.7–7.7)
Neutrophils Relative %: 74 %
Platelets: 249 10*3/uL (ref 150–400)
RBC: 4.62 MIL/uL (ref 3.87–5.11)
RDW: 13.4 % (ref 11.5–15.5)
WBC: 16.4 10*3/uL — ABNORMAL HIGH (ref 4.0–10.5)

## 2017-01-13 LAB — URINALYSIS, MICROSCOPIC (REFLEX)

## 2017-01-13 LAB — URINALYSIS, ROUTINE W REFLEX MICROSCOPIC
Glucose, UA: NEGATIVE mg/dL
Ketones, ur: 15 mg/dL — AB
Nitrite: POSITIVE — AB
Protein, ur: 100 mg/dL — AB
Specific Gravity, Urine: 1.025 (ref 1.005–1.030)
pH: 5 (ref 5.0–8.0)

## 2017-01-13 LAB — BASIC METABOLIC PANEL
Anion gap: 8 (ref 5–15)
BUN: 10 mg/dL (ref 6–20)
CO2: 31 mmol/L (ref 22–32)
Calcium: 8.6 mg/dL — ABNORMAL LOW (ref 8.9–10.3)
Chloride: 96 mmol/L — ABNORMAL LOW (ref 101–111)
Creatinine, Ser: 1.1 mg/dL — ABNORMAL HIGH (ref 0.44–1.00)
GFR calc Af Amer: 59 mL/min — ABNORMAL LOW (ref >60)
GFR calc non Af Amer: 51 mL/min — ABNORMAL LOW (ref >60)
Glucose, Bld: 145 mg/dL — ABNORMAL HIGH (ref 65–99)
Potassium: 3 mmol/L — ABNORMAL LOW (ref 3.5–5.1)
Sodium: 135 mmol/L (ref 135–145)

## 2017-01-13 MED ORDER — MORPHINE SULFATE (PF) 4 MG/ML IV SOLN
4.0000 mg | Freq: Once | INTRAVENOUS | Status: AC
  Administered 2017-01-13: 4 mg via INTRAVENOUS
  Filled 2017-01-13: qty 1

## 2017-01-13 MED ORDER — HYDROCODONE-ACETAMINOPHEN 5-325 MG PO TABS: 1.0000 | ORAL_TABLET | Freq: Four times a day (QID) | ORAL | 0 refills | Status: DC | PRN

## 2017-01-13 MED ORDER — ONDANSETRON HCL 4 MG/2ML IJ SOLN
4.0000 mg | Freq: Once | INTRAMUSCULAR | Status: AC
  Administered 2017-01-13: 4 mg via INTRAVENOUS
  Filled 2017-01-13: qty 2

## 2017-01-13 MED ORDER — DEXTROSE 5 % IV SOLN
1.0000 g | Freq: Once | INTRAVENOUS | Status: AC
  Administered 2017-01-13: 1 g via INTRAVENOUS
  Filled 2017-01-13: qty 10

## 2017-01-13 MED ORDER — CEPHALEXIN 500 MG PO CAPS: 500.0000 mg | ORAL_CAPSULE | Freq: Four times a day (QID) | ORAL | 0 refills | Status: DC

## 2017-01-13 MED ORDER — IOPAMIDOL (ISOVUE-300) INJECTION 61%
100.0000 mL | Freq: Once | INTRAVENOUS | Status: AC | PRN
  Administered 2017-01-13: 100 mL via INTRAVENOUS

## 2017-01-13 MED ORDER — PHENAZOPYRIDINE HCL 200 MG PO TABS: 200.0000 mg | ORAL_TABLET | Freq: Three times a day (TID) | ORAL | 0 refills | Status: DC

## 2017-01-13 NOTE — ED Notes (Signed)
Patient transported to CT 

## 2017-01-13 NOTE — ED Triage Notes (Addendum)
Pt c/o pelvic pain , pressure  n/v  x 3 days

## 2017-01-13 NOTE — ED Provider Notes (Signed)
[facility] Provider Note   CSN: [id number] Arrival date & time: 01/13/17  0057     History   Chief Complaint Chief Complaint  Patient presents with  . Pelvic Pain    HPI Maria Adkins is a 66 y.o. female.  Patient is a 66 year old female with past medical history of HIV disease, hypertension. She presents for evaluation of suprapubic pain that is worsened over the past 2 days. She thought she was constipated and tried laxatives with good results, however her pain persisted. She denies any fevers or chills. She denies any urinary complaints.   The history is provided by the patient.  Pelvic Pain  This is a new problem. The current episode started 2 days ago. The problem occurs constantly. The problem has been gradually worsening. Associated symptoms include abdominal pain. Pertinent negatives include no chest pain. Nothing aggravates the symptoms. Nothing relieves the symptoms. Treatments tried: Laxatives. The treatment provided no relief.    Past Medical History:  Diagnosis Date  . Asthma   . High blood pressure   . HIV (human immunodeficiency virus infection) ([place])   . Osteoarthritis     Patient Active Problem List   Diagnosis Date Noted  . Allergy 12/16/2016  . 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 Essential hypertension 08/19/2016  . Human immunodeficiency virus (HIV) disease ([place]) 08/19/2016    Past Surgical History:  Procedure Laterality Date  . Biopsy of Liver    . CATARACT EXTRACTION, BILATERAL    . CHOLECYSTECTOMY    . CYST REMOVAL HAND Left   . LAPAROSCOPIC SALPINGOOPHERECTOMY Right     OB History    No data available       Home Medications    Prior to Admission medications   Medication Sig Start Date End Date Taking? Authorizing Provider  albuterol (PROVENTIL HFA;VENTOLIN HFA) 108 (90 Base) MCG/ACT inhaler Inhale 2 puffs into the lungs every 6 (six) hours as needed for wheezing or shortness of breath. 12/31/16   Lauree Chandler, NP   dolutegravir (TIVICAY) 50 MG tablet Take 1 tablet (50 mg total) by mouth daily. 10/15/16   Carlyle Basques, MD  emtricitabine-rilpivir-tenofovir AF (ODEFSEY) 200-25-25 MG TABS tablet Take 1 tablet by mouth daily with breakfast. 10/15/16   Carlyle Basques, MD  enalapril-hydrochlorothiazide (VASERETIC) 10-25 MG tablet Take 1 tablet by mouth daily. 12/16/16   Estill Dooms, MD  fluticasone (FLONASE) 50 MCG/ACT nasal spray Place 2 sprays into both nostrils daily. 12/31/16   Lauree Chandler, NP  Fluticasone-Salmeterol (ADVAIR) 100-50 MCG/DOSE AEPB Inhale 1 puff into the lungs 2 (two) times daily. 01/11/17   Lauree Chandler, NP  metoprolol (LOPRESSOR) 100 MG tablet Take 1 tablet (100 mg total) by mouth daily. 10/16/16   Monica Carter, DO  montelukast (SINGULAIR) 10 MG tablet Take 1 tablet (10 mg total) by mouth at bedtime. 12/31/16   Lauree Chandler, NP    Family History Family History  Problem Relation Age of Onset  . Diabetes Mother     died at age 68  . Hypertension Mother   . Asthma Sister   . Diabetes Sister   . Hypertension Sister   . Arthritis Sister   . Diabetes Sister   . Hypertension Sister   . Asthma Sister   . Arthritis Sister   . Asthma Son     controlled    Social History Social History  Substance Use Topics  . Smoking status: Former Smoker    Packs/day: 1.00  Years: 40.00    Types: Cigarettes  . Smokeless tobacco: Never Used  . Alcohol use No     Allergies   Patient has no known allergies.   Review of Systems Review of Systems  Cardiovascular: Negative for chest pain.  Gastrointestinal: Positive for abdominal pain.  Genitourinary: Positive for pelvic pain.  All other systems reviewed and are negative.    Physical Exam Updated Vital Signs BP (!) 133/92 (BP Location: Left Arm)   Pulse (!) 113   Temp 98 F (36.7 C) (Oral)   Resp 18   Ht 5\' 8"  (1.727 m)   Wt 177 lb (80.3 kg)   SpO2 97%   BMI 26.91 kg/m   Physical Exam  Constitutional: She is  oriented to person, place, and time. She appears well-developed and well-nourished. No distress.  HENT:  Head: Normocephalic and atraumatic.  Neck: Normal range of motion. Neck supple.  Cardiovascular: Normal rate and regular rhythm.  Exam reveals no gallop and no friction rub.   No murmur heard. Pulmonary/Chest: Effort normal and breath sounds normal. No respiratory distress. She has no wheezes.  Abdominal: Soft. Bowel sounds are normal. She exhibits no distension. There is tenderness. There is no rebound and no guarding.  There is tenderness to palpation in the suprapubic region.  Musculoskeletal: Normal range of motion.  Neurological: She is alert and oriented to person, place, and time.  Skin: Skin is warm and dry. She is not diaphoretic.  Nursing note and vitals reviewed.    ED Treatments / Results  Labs (all labs ordered are listed, but only abnormal results are displayed) Labs Reviewed  URINALYSIS, ROUTINE W REFLEX MICROSCOPIC - Abnormal; Notable for the following:       Result Value   Color, Urine AMBER (*)    APPearance CLOUDY (*)    Hgb urine dipstick LARGE (*)    Bilirubin Urine SMALL (*)    Ketones, ur 15 (*)    Protein, ur 100 (*)    Nitrite POSITIVE (*)    Leukocytes, UA MODERATE (*)    All other components within normal limits  URINALYSIS, MICROSCOPIC (REFLEX) - Abnormal; Notable for the following:    Bacteria, UA FEW (*)    Squamous Epithelial / LPF 0-5 (*)    All other components within normal limits  BASIC METABOLIC PANEL  CBC WITH DIFFERENTIAL/PLATELET    EKG  EKG Interpretation None       Radiology No results found.  Procedures Procedures (including critical care time)  Medications Ordered in ED Medications - No data to display   Initial Impression / Assessment and Plan / ED Course  I have reviewed the triage vital signs and the nursing notes.  Pertinent labs & imaging results that were available during my care of the patient were  reviewed by me and considered in my medical decision making (see chart for details).  Urinalysis shows infection and CT scan shows thickening of the urinary bladder consistent with cystitis. She will be treated with antibiotics and is to follow-up as needed.  Final Clinical Impressions(s) / ED Diagnoses   Final diagnoses:  None    New Prescriptions New Prescriptions   No medications on file     Veryl Speak, MD 01/13/17 0401

## 2017-01-13 NOTE — Discharge Instructions (Signed)
Keflex and Pyridium as prescribed.  Hydrocodone as prescribed as needed for pain.  Return to the emergency department if you develop worsening pain, high fevers, or other new and concerning symptoms.

## 2017-02-05 ENCOUNTER — Encounter: Payer: Self-pay | Admitting: Pulmonary Disease

## 2017-02-05 ENCOUNTER — Ambulatory Visit (INDEPENDENT_AMBULATORY_CARE_PROVIDER_SITE_OTHER): Payer: Medicare Other | Admitting: Pulmonary Disease

## 2017-02-05 DIAGNOSIS — J45909 Unspecified asthma, uncomplicated: Secondary | ICD-10-CM | POA: Diagnosis not present

## 2017-02-05 NOTE — Patient Instructions (Signed)
   It was a pleasure taking care of you today!  You are diagnosed with Asthma.   Asthma is a chronic disease that affects the airways of your lungs.When you have asthma, your airways become swollen. The swelling causes the airways to make thick, sticky secretions called mucus.   Asthma also causes the muscles in and around your airways to get very tight.  This swelling, mucus, and tight muscles can make your airways narrower than normal and it becomes very hard for you to get air into and out of your lungs.   Sometimes, when you have a lung infection, this can make your breathing worse, and will cause you to  have an asthma flare-up. Please call your primary care doctor or the office if you are having an asthma flare-up.   Smoking makes asthma worse.   As your asthma is stable, try weaning off Advair, followed by Singulair, followed by Flonase.  Return to clinic in 6 months

## 2017-02-05 NOTE — Assessment & Plan Note (Addendum)
Patient has asthma which has been stable for at least 10 years, not on medicines, until she moved from [place] to [place] in October 2017. In her younger years, she had frequent flareup requiring frequent admissions and prednisone use. The last 10 years, she has not required any medicines. She moved to [facility] in October to take care of her grand child. She was fine until April of this year. Pollen made her asthma flareup. She was better and prednisone but she did not want to be on prolonged prednisone. She was very symptomatic. She was placed on Advair and Singulair. She has been on these 2 medicines for a month now and she is significantly improved.  She is happy with the way her asthma is controlled. Denies symptoms. Has not used albuterol.  We extensively discussed asthma diagnosis and asthma care plan. As her asthma is now stable, I suggest that she starts tapering off medicine one by one. She can start off an Advair, followed by Singulair, followed by Flonase. Obviously if she has recurrence of symptoms, she needs to be back on medicines.  She does not think that her asthma will flare up during summer, or fall. I suggested, she needs to prime herself and start medicines with Advair, Singulair, Flonase, sometime in March before pollen season starts.  Follow-up in 6 months to make sure everything is well. If she gets more symptomatic, she needs to be seen sooner.  She had a chest x-ray last month which was unremarkable. She wanted to hold off on pulmonary function test. She will need to have vaccines this fall.

## 2017-02-05 NOTE — Progress Notes (Signed)
Subjective:    [name], female    DOB: 12/12/1950, 66 y.o.   MRN: [id number]  HPI   This is the case of Maria Adkins, 66 y.o. Female, who was referred by Dr. Sherrie Mustache  in consultation regarding her asthma  As you very well know, patient Has a 15-pack-year smoking history, quit in her 9s. Patient was diagnosed with asthma in her 41s. She has a lot of allergies (pollen, dogs,cats, trees, ragweed, mildew). She came from [place] and moved to [place] in October 2017 to take care of her grandchild. Her asthma was stable in [place] for the most part. She had difficult to control asthma requiring frequent admissions and prednisone in her younger years.  She has NOT been on meds for asthma at least 10 yrs.   Since moving here, she was OK until April with the pollen. She became short of breath with cough and with wheezing. She was given prednisone taper and that improved her symptoms but they recurred after prednisone. She was switched to Advair 100/50 one puff twice a day as well as Singulair. She did not want to be on prednisone. Her symptoms subsequently improved.  At present, she is happy with the way her asthma is. She feels improved. No subjective complaints.     Review of Systems  Constitutional: Negative.  Negative for fever and unexpected weight change.  HENT: Positive for congestion. Negative for dental problem, ear pain, nosebleeds, postnasal drip, rhinorrhea, sinus pressure, sneezing, sore throat and trouble swallowing.   Eyes: Positive for itching. Negative for redness.  Respiratory: Positive for shortness of breath. Negative for cough, chest tightness and wheezing.   Cardiovascular: Negative.  Negative for palpitations and leg swelling.  Gastrointestinal: Negative.  Negative for nausea and vomiting.  Endocrine: Negative.   Genitourinary: Negative.  Negative for dysuria.  Musculoskeletal: Negative.  Negative for joint swelling.  Skin: Negative.  Negative for rash.    Allergic/Immunologic: Negative.  Negative for environmental allergies, food allergies and immunocompromised state.  Neurological: Negative.  Negative for headaches.  Hematological: Bruises/bleeds easily.  Psychiatric/Behavioral: Negative.  Negative for dysphoric mood. The patient is not nervous/anxious.    Past Medical History:  Diagnosis Date  . Asthma   . High blood pressure   . HIV (human immunodeficiency virus infection) ([place])   . Osteoarthritis    (-) CA, DVT HIV from blood transfusion. (-) Cx. Since 2010. ew  Family History  Problem Relation Age of Onset  . Diabetes Mother        died at age 7  . Hypertension Mother   . Asthma Sister   . Diabetes Sister   . Hypertension Sister   . Arthritis Sister   . Diabetes Sister   . Hypertension Sister   . Asthma Sister   . Arthritis Sister   . Asthma Son        controlled     Past Surgical History:  Procedure Laterality Date  . Biopsy of Liver    . CATARACT EXTRACTION, BILATERAL    . CHOLECYSTECTOMY    . CYST REMOVAL HAND Left   . LAPAROSCOPIC SALPINGOOPHERECTOMY Right     Social History   Social History  . Marital status: Married    Spouse name: N/A  . Number of children: N/A  . Years of education: N/A   Occupational History  . Not on file.   Social History Main Topics  . Smoking status: Former Smoker    Packs/day: 1.00  Years: 40.00    Types: Cigarettes  . Smokeless tobacco: Never Used  . Alcohol use No  . Drug use: No  . Sexual activity: Yes    Partners: Male    Birth control/ protection: Condom     Comment: declined condoms   Other Topics Concern  . Not on file   Social History Narrative   Diet? Regular      Do you drink/eat things with caffeine? Coffee      Marital status?  Married                                  What year were you married? March 16, 2009      Do you live in a house, apartment, assisted living, condo, trailer, etc.? House      Is it one or more stories? 1 story       How many persons live in your home? 2      Do you have any pets in your home? (please list) yes-dog      Current or past profession: Administrator      Do you exercise?  no                                    Type & how often? n/a      Do you have a living will? no      Do you have a DNR form?     no                             If not, do you want to discuss one? Yes      Do you have signed POA/HPOA for forms? no           No Known Allergies   Outpatient Medications Prior to Visit  Medication Sig Dispense Refill  . albuterol (PROVENTIL HFA;VENTOLIN HFA) 108 (90 Base) MCG/ACT inhaler Inhale 2 puffs into the lungs every 6 (six) hours as needed for wheezing or shortness of breath. 1 Inhaler 0  . dolutegravir (TIVICAY) 50 MG tablet Take 1 tablet (50 mg total) by mouth daily. 30 tablet 5  . emtricitabine-rilpivir-tenofovir AF (ODEFSEY) 200-25-25 MG TABS tablet Take 1 tablet by mouth daily with breakfast. 30 tablet 5  . enalapril-hydrochlorothiazide (VASERETIC) 10-25 MG tablet Take 1 tablet by mouth daily. 30 tablet 6  . fluticasone (FLONASE) 50 MCG/ACT nasal spray Place 2 sprays into both nostrils daily. 16 g 6  . Fluticasone-Salmeterol (ADVAIR) 100-50 MCG/DOSE AEPB Inhale 1 puff into the lungs 2 (two) times daily. 1 each 3  . metoprolol (LOPRESSOR) 100 MG tablet Take 1 tablet (100 mg total) by mouth daily. 90 tablet 1  . HYDROcodone-acetaminophen (NORCO/VICODIN) 5-325 MG tablet Take 1-2 tablets by mouth every 6 (six) hours as needed. (Patient not taking: Reported on 02/05/2017) 10 tablet 0  . montelukast (SINGULAIR) 10 MG tablet Take 1 tablet (10 mg total) by mouth at bedtime. (Patient not taking: Reported on 02/05/2017) 30 tablet 3  . cephALEXin (KEFLEX) 500 MG capsule Take 1 capsule (500 mg total) by mouth 4 (four) times daily. (Patient not taking: Reported on 02/05/2017) 28 capsule 0  . phenazopyridine (PYRIDIUM) 200 MG tablet Take 1 tablet (200 mg total) by mouth 3 (three) times daily.  (Patient not taking:  Reported on 02/05/2017) 6 tablet 0   No facility-administered medications prior to visit.    No orders of the defined types were placed in this encounter.       Objective:   Physical Exam   Vitals:  Vitals:   02/05/17 1456  BP: 110/76  Pulse: 68  SpO2: 96%  Weight: 176 lb 9.6 oz (80.1 kg)  Height: 5\' 8"  (1.727 m)    Constitutional/General:  Pleasant, well-nourished, well-developed, not in any distress,  Comfortably seating.  Well kempt  Body mass index is 26.85 kg/m. Wt Readings from Last 3 Encounters:  02/05/17 176 lb 9.6 oz (80.1 kg)  01/13/17 177 lb (80.3 kg)  01/11/17 177 lb 9.6 oz (80.6 kg)    HEENT: Pupils equal and reactive to light and accommodation. Anicteric sclerae. Normal nasal mucosa.   No oral  lesions,  mouth clear,  oropharynx clear, no postnasal drip. (-) Oral thrush. No dental caries.  Airway - Mallampati class III  Neck: No masses. Midline trachea. No JVD, (-) LAD. (-) bruits appreciated.  Respiratory/Chest: Grossly normal chest. (-) deformity. (-) Accessory muscle use.  Symmetric expansion. (-) Tenderness on palpation.  Resonant on percussion.  Diminished BS on both lower lung zones. (-) wheezing, crackles, rhonchi (-) egophony  Cardiovascular: Regular rate and  rhythm, heart sounds normal, no murmur or gallops, no peripheral edema  Gastrointestinal:  Normal bowel sounds. Soft, non-tender. No hepatosplenomegaly.  (-) masses.   Musculoskeletal:  Normal muscle tone. Normal gait.   Extremities: Grossly normal. (-) clubbing, cyanosis.  (-) edema  Skin: (-) rash,lesions seen.   Neurological/Psychiatric : alert, oriented to time, place, person. Normal mood and affect         Assessment & Plan:  Asthma Patient has asthma which has been stable for at least 10 years, not on medicines, until she moved from [place] to [place] in October 2017. In her younger years, she had frequent flareup requiring frequent  admissions and prednisone use. The last 10 years, she has not required any medicines. She moved to [facility] in October to take care of her grand child. She was fine until April of this year. Pollen made her asthma flareup. She was better and prednisone but she did not want to be on prolonged prednisone. She was very symptomatic. She was placed on Advair and Singulair. She has been on these 2 medicines for a month now and she is significantly improved.  She is happy with the way her asthma is controlled. Denies symptoms. Has not used albuterol.  We extensively discussed asthma diagnosis and asthma care plan. As her asthma is now stable, I suggest that she starts tapering off medicine one by one. She can start off an Advair, followed by Singulair, followed by Flonase. Obviously if she has recurrence of symptoms, she needs to be back on medicines.  She does not think that her asthma will flare up during summer, or fall. I suggested, she needs to prime herself and start medicines with Advair, Singulair, Flonase, sometime in March before pollen season starts.  Follow-up in 6 months to make sure everything is well. If she gets more symptomatic, she needs to be seen sooner.  She had a chest x-ray last month which was unremarkable. She wanted to hold off on pulmonary function test. She will need to have vaccines this fall.      Thank you very much for letting me participate in this patient's care. Please do not hesitate to give me a call  if you have any questions or concerns regarding the treatment plan.   Patient will follow up with [place] in 6 months    J. Shirl Harris, MD 02/05/2017   3:55 PM [facility] and [place] Pager: [phone] Office: [phone], Fax: [phone]

## 2017-02-12 ENCOUNTER — Ambulatory Visit (INDEPENDENT_AMBULATORY_CARE_PROVIDER_SITE_OTHER): Payer: Medicare Other | Admitting: Internal Medicine

## 2017-02-12 ENCOUNTER — Encounter: Payer: Self-pay | Admitting: Internal Medicine

## 2017-02-12 VITALS — BP 132/86 | HR 65 | Temp 97.6°F | Ht 68.0 in | Wt 179.2 lb

## 2017-02-12 DIAGNOSIS — B20 Human immunodeficiency virus [HIV] disease: Secondary | ICD-10-CM

## 2017-02-12 DIAGNOSIS — I10 Essential (primary) hypertension: Secondary | ICD-10-CM | POA: Diagnosis not present

## 2017-02-12 DIAGNOSIS — J4541 Moderate persistent asthma with (acute) exacerbation: Secondary | ICD-10-CM | POA: Diagnosis not present

## 2017-02-12 NOTE — Progress Notes (Signed)
[name], female   DOB: [birth year], 66 y.o.   MRN: [id number]    Location:  [facility] Place of Service: OFFICE  Chief Complaint  Patient presents with  . Medical Management of Chronic Issues    6 week routine visit    HPI:  66 yo female seen today for f/u. She reports feeling well overall. She plans to travel to [facility] for vacation soon. She is excited.   She was seen several times in the office and once in the ED for asthma and seasonal allergy in the last 6 mos. She also was tx for UTI in early May 2018. She reports asthma sx's better controlled and reports pulmonary Dr Corrie Dandy recently told her she could taper meds (advair 100/50, flonase, singulair, proventil hfa) off. She states she will keep them at home in case she needs them again.  Hx asthma - well controlled. Rarely uses prn HFA. She has had moderate persistent sx's in past and was rx advair 100/50, flonase, singulair, proventil hfa. Followed by pulmonary Dr Corrie Dandy  HIV - stable. HIV1 RNA quant 22 with log of 1.34 in Mar 2018. CD4 count 594. Currently takes tivicay (dolutegravir) and odefsey (emtricitabine-rilpivir-tenofovir). Followed by ID Dr Baxter Flattery  HTN - BP stable on metoprolol succ and enalapril hct  Osteoporosis - stable on fosamax. Last DXA 2 yrs ago  Remote tobacco abuse hx   Past Medical History:  Diagnosis Date  . Asthma   . High blood pressure   . HIV (human immunodeficiency virus infection) ([place])   . Osteoarthritis     Past Surgical History:  Procedure Laterality Date  . Biopsy of Liver    . CATARACT EXTRACTION, BILATERAL    . CHOLECYSTECTOMY    . CYST REMOVAL HAND Left   . LAPAROSCOPIC SALPINGOOPHERECTOMY Right     Patient Care Team: [name], DO as PCP - General (Internal Medicine)  Social History   Social History  . Marital status: Married    Spouse name: N/A  . Number of children: N/A  . Years of education: N/A   Occupational History  . Not on file.   Social History Main  Topics  . Smoking status: Former Smoker    Packs/day: 1.00    Years: 40.00    Types: Cigarettes  . Smokeless tobacco: Never Used  . Alcohol use No  . Drug use: No  . Sexual activity: Yes    Partners: Male    Birth control/ protection: Condom     Comment: declined condoms   Other Topics Concern  . Not on file   Social History Narrative   Diet? Regular      Do you drink/eat things with caffeine? Coffee      Marital status?  Married                                  What year were you married? March 16, 2009      Do you live in a house, apartment, assisted living, condo, trailer, etc.? House      Is it one or more stories? 1 story      How many persons live in your home? 2      Do you have any pets in your home? (please list) yes-dog      Current or past profession: Administrator      Do you exercise?  no  Type & how often? n/a      Do you have a living will? no      Do you have a DNR form?     no                             If not, do you want to discuss one? Yes      Do you have signed POA/HPOA for forms? no           reports that she has quit smoking. Her smoking use included Cigarettes. She has a 40.00 pack-year smoking history. She has never used smokeless tobacco. She reports that she does not drink alcohol or use drugs.  Family History  Problem Relation Age of Onset  . Diabetes Mother        died at age 13  . Hypertension Mother   . Asthma Sister   . Diabetes Sister   . Hypertension Sister   . Arthritis Sister   . Diabetes Sister   . Hypertension Sister   . Asthma Sister   . Arthritis Sister   . Asthma Son        controlled   Family Status  Relation Status  . Mother Deceased  . Father Deceased  . Sister Alive  . Sister Alive  . Sister Alive  . Daughter Alive  . Son Alive     No Known Allergies  Medications: Patient's Medications  New Prescriptions   No medications on file  Previous Medications    DOLUTEGRAVIR (TIVICAY) 50 MG TABLET    Take 1 tablet (50 mg total) by mouth daily.   EMTRICITABINE-RILPIVIR-TENOFOVIR AF (ODEFSEY) 200-25-25 MG TABS TABLET    Take 1 tablet by mouth daily with breakfast.   ENALAPRIL-HYDROCHLOROTHIAZIDE (VASERETIC) 10-25 MG TABLET    Take 1 tablet by mouth daily.   METOPROLOL (LOPRESSOR) 100 MG TABLET    Take 1 tablet (100 mg total) by mouth daily.  Modified Medications   No medications on file  Discontinued Medications   ALBUTEROL (PROVENTIL HFA;VENTOLIN HFA) 108 (90 BASE) MCG/ACT INHALER    Inhale 2 puffs into the lungs every 6 (six) hours as needed for wheezing or shortness of breath.   FLUTICASONE (FLONASE) 50 MCG/ACT NASAL SPRAY    Place 2 sprays into both nostrils daily.   FLUTICASONE-SALMETEROL (ADVAIR) 100-50 MCG/DOSE AEPB    Inhale 1 puff into the lungs 2 (two) times daily.   HYDROCODONE-ACETAMINOPHEN (NORCO/VICODIN) 5-325 MG TABLET    Take 1-2 tablets by mouth every 6 (six) hours as needed.   MONTELUKAST (SINGULAIR) 10 MG TABLET    Take 1 tablet (10 mg total) by mouth at bedtime.    Review of Systems  Respiratory: Negative for shortness of breath.   Cardiovascular: Negative for chest pain and leg swelling.  Gastrointestinal: Negative for diarrhea.  Genitourinary: Negative for dysuria.  Musculoskeletal: Negative for arthralgias.  All other systems reviewed and are negative.   Vitals:   02/12/17 0814  BP: 132/86  Pulse: 65  Temp: 97.6 F (36.4 C)  TempSrc: Oral  SpO2: 96%  Weight: 179 lb 3.2 oz (81.3 kg)  Height: '5\' 8"'  (1.727 m)   Body mass index is 27.25 kg/m.  Physical Exam  Constitutional: She is oriented to person, place, and time. She appears well-developed and well-nourished.  HENT:  Mouth/Throat: Oropharynx is clear and moist. No oropharyngeal exudate.  Eyes: Pupils are equal, round, and reactive to light. No scleral  icterus.  Neck: Neck supple. Carotid bruit is not present. No tracheal deviation present. No thyromegaly  present.  Cardiovascular: Normal rate, regular rhythm, normal heart sounds and intact distal pulses.  Exam reveals no gallop and no friction rub.   No murmur heard. No LE edema b/l. no calf TTP.   Pulmonary/Chest: Effort normal and breath sounds normal. No stridor. No respiratory distress. She has no wheezes. She has no rales.  Abdominal: Soft. Bowel sounds are normal. She exhibits no distension and no mass. There is no hepatomegaly. There is no tenderness. There is no rebound and no guarding.  Lymphadenopathy:       Head (right side): No posterior auricular and no occipital adenopathy present.       Head (left side): No posterior auricular and no occipital adenopathy present.    She has no cervical adenopathy.       Right: No supraclavicular adenopathy present.       Left: No supraclavicular adenopathy present.  Neurological: She is alert and oriented to person, place, and time.  Skin: Skin is warm and dry. No rash noted.  Psychiatric: She has a normal mood and affect. Her behavior is normal. Judgment and thought content normal.     Labs reviewed: Admission on 01/13/2017, Discharged on 01/13/2017  Component Date Value Ref Range Status  . Color, Urine 01/13/2017 AMBER* YELLOW Final   BIOCHEMICALS MAY BE AFFECTED BY COLOR  . APPearance 01/13/2017 CLOUDY* CLEAR Final  . Specific Gravity, Urine 01/13/2017 1.025  1.005 - 1.030 Final  . pH 01/13/2017 5.0  5.0 - 8.0 Final  . Glucose, UA 01/13/2017 NEGATIVE  NEGATIVE mg/dL Final  . Hgb urine dipstick 01/13/2017 LARGE* NEGATIVE Final  . Bilirubin Urine 01/13/2017 SMALL* NEGATIVE Final  . Ketones, ur 01/13/2017 15* NEGATIVE mg/dL Final  . Protein, ur 01/13/2017 100* NEGATIVE mg/dL Final  . Nitrite 01/13/2017 POSITIVE* NEGATIVE Final  . Leukocytes, UA 01/13/2017 MODERATE* NEGATIVE Final  . RBC / HPF 01/13/2017 6-30  0 - 5 RBC/hpf Final  . WBC, UA 01/13/2017 TOO NUMEROUS TO COUNT  0 - 5 WBC/hpf Final  . Bacteria, UA 01/13/2017 FEW* NONE SEEN  Final  . Squamous Epithelial / LPF 01/13/2017 0-5* NONE SEEN Final  . Sodium 01/13/2017 135  135 - 145 mmol/L Final  . Potassium 01/13/2017 3.0* 3.5 - 5.1 mmol/L Final  . Chloride 01/13/2017 96* 101 - 111 mmol/L Final  . CO2 01/13/2017 31  22 - 32 mmol/L Final  . Glucose, Bld 01/13/2017 145* 65 - 99 mg/dL Final  . BUN 01/13/2017 10  6 - 20 mg/dL Final  . Creatinine, Ser 01/13/2017 1.10* 0.44 - 1.00 mg/dL Final  . Calcium 01/13/2017 8.6* 8.9 - 10.3 mg/dL Final  . GFR calc non Af Amer 01/13/2017 51* >60 mL/min Final  . GFR calc Af Amer 01/13/2017 59* >60 mL/min Final   Comment: (NOTE) The eGFR has been calculated using the CKD EPI equation. This calculation has not been validated in all clinical situations. eGFR's persistently <60 mL/min signify possible Chronic Kidney Disease.   . Anion gap 01/13/2017 8  5 - 15 Final  . WBC 01/13/2017 16.4* 4.0 - 10.5 K/uL Final  . RBC 01/13/2017 4.62  3.87 - 5.11 MIL/uL Final  . Hemoglobin 01/13/2017 14.6  12.0 - 15.0 g/dL Final  . HCT 01/13/2017 43.3  36.0 - 46.0 % Final  . MCV 01/13/2017 93.7  78.0 - 100.0 fL Final  . MCH 01/13/2017 31.6  26.0 - 34.0 pg Final  .  MCHC 01/13/2017 33.7  30.0 - 36.0 g/dL Final  . RDW 01/13/2017 13.4  11.5 - 15.5 % Final  . Platelets 01/13/2017 249  150 - 400 K/uL Final  . Neutrophils Relative % 01/13/2017 74  % Final  . Lymphocytes Relative 01/13/2017 12  % Final  . Monocytes Relative 01/13/2017 13  % Final  . Eosinophils Relative 01/13/2017 1  % Final  . Basophils Relative 01/13/2017 0  % Final  . Neutro Abs 01/13/2017 12.1* 1.7 - 7.7 K/uL Final  . Lymphs Abs 01/13/2017 2.0  0.7 - 4.0 K/uL Final  . Monocytes Absolute 01/13/2017 2.1* 0.1 - 1.0 K/uL Final  . Eosinophils Absolute 01/13/2017 0.2  0.0 - 0.7 K/uL Final  . Basophils Absolute 01/13/2017 0.0  0.0 - 0.1 K/uL Final  . Smear Review 01/13/2017 MORPHOLOGY UNREMARKABLE   Final  Appointment on 01/05/2017  Component Date Value Ref Range Status  . C. difficile  [facility] 01/04/2017 Not Detected   Final  . C. difficile Toxin A/B 01/04/2017 Not Detected   Final   No Toxigenic C. difficile Detected  . Source 01/04/2017 STOOL   Final  Orders Only on 01/04/2017  Component Date Value Ref Range Status  . Source: 01/04/2017 STOOL   Final  . Fecal Globin Immuno 01/04/2017 Not Detected  Not Detected Final  Admission on 01/03/2017, Discharged on 01/03/2017  Component Date Value Ref Range Status  . WBC 01/03/2017 6.8  4.0 - 10.5 K/uL Final  . RBC 01/03/2017 4.53  3.87 - 5.11 MIL/uL Final  . Hemoglobin 01/03/2017 14.2  12.0 - 15.0 g/dL Final  . HCT 01/03/2017 42.3  36.0 - 46.0 % Final  . MCV 01/03/2017 93.4  78.0 - 100.0 fL Final  . MCH 01/03/2017 31.3  26.0 - 34.0 pg Final  . MCHC 01/03/2017 33.6  30.0 - 36.0 g/dL Final  . RDW 01/03/2017 13.3  11.5 - 15.5 % Final  . Platelets 01/03/2017 233  150 - 400 K/uL Final  . Neutrophils Relative % 01/03/2017 35  % Final  . Lymphocytes Relative 01/03/2017 43  % Final  . Monocytes Relative 01/03/2017 13  % Final  . Eosinophils Relative 01/03/2017 8  % Final  . Basophils Relative 01/03/2017 1  % Final  . Neutro Abs 01/03/2017 2.4  1.7 - 7.7 K/uL Final  . Lymphs Abs 01/03/2017 2.9  0.7 - 4.0 K/uL Final  . Monocytes Absolute 01/03/2017 0.9  0.1 - 1.0 K/uL Final  . Eosinophils Absolute 01/03/2017 0.5  0.0 - 0.7 K/uL Final  . Basophils Absolute 01/03/2017 0.1  0.0 - 0.1 K/uL Final  . WBC Morphology 01/03/2017 ATYPICAL LYMPHOCYTES   Final  . Smear Review 01/03/2017 LARGE PLATELETS PRESENT   Final  . Troponin I 01/03/2017 <0.03  <0.03 ng/mL Final  . Sodium 01/03/2017 140  135 - 145 mmol/L Final  . Potassium 01/03/2017 3.2* 3.5 - 5.1 mmol/L Final  . Chloride 01/03/2017 99* 101 - 111 mmol/L Final  . CO2 01/03/2017 32  22 - 32 mmol/L Final  . Glucose, Bld 01/03/2017 131* 65 - 99 mg/dL Final  . BUN 01/03/2017 12  6 - 20 mg/dL Final  . Creatinine, Ser 01/03/2017 1.16* 0.44 - 1.00 mg/dL Final  . Calcium 01/03/2017 9.6  8.9 -  10.3 mg/dL Final  . Total Protein 01/03/2017 7.5  6.5 - 8.1 g/dL Final  . Albumin 01/03/2017 4.1  3.5 - 5.0 g/dL Final  . AST 01/03/2017 22  15 - 41 U/L Final  .  ALT 01/03/2017 19  14 - 54 U/L Final  . Alkaline Phosphatase 01/03/2017 50  38 - 126 U/L Final  . Total Bilirubin 01/03/2017 0.8  0.3 - 1.2 mg/dL Final  . GFR calc non Af Amer 01/03/2017 48* >60 mL/min Final  . GFR calc Af Amer 01/03/2017 55* >60 mL/min Final   Comment: (NOTE) The eGFR has been calculated using the CKD EPI equation. This calculation has not been validated in all clinical situations. eGFR's persistently <60 mL/min signify possible Chronic Kidney Disease.   . Anion gap 01/03/2017 9  5 - 15 Final    No results found.   Assessment/Plan   ICD-9-CM ICD-10-CM   1. Essential hypertension 401.9 I10   2. Moderate persistent asthma with acute exacerbation 493.92 J45.41   3. Human immunodeficiency virus (HIV) disease ([place]) 042 B20    Schedule Prevnar vaccine Nurse room visit for July 2nd  Continue current medications as ordered  Follow up with Dr Baxter Flattery as scheduled  Follow up in 6 mos for asthma and HTN. Check fasting labs prior to next visit in 6 mos (cmp, lipid panel, tsh, ua, cbc w diff)    Tuan Tippin S. Perlie Gold  [facility] and [facility] [address] [place], [place] [address] [phone] Cell (Monday-Friday 8 AM - 5 PM) [phone] After 5 PM and follow prompts

## 2017-02-12 NOTE — Patient Instructions (Addendum)
Schedule Prevnar vaccine Nurse room visit for July 2nd  Continue current medications as ordered  Follow up with Dr Baxter Flattery as scheduled  Follow up in 6 mos for asthma and HTN. Check fasting labs prior to next visit in 6 mos

## 2017-02-15 ENCOUNTER — Ambulatory Visit (INDEPENDENT_AMBULATORY_CARE_PROVIDER_SITE_OTHER): Payer: 59 | Admitting: Internal Medicine

## 2017-02-15 ENCOUNTER — Encounter: Payer: Self-pay | Admitting: Internal Medicine

## 2017-02-15 VITALS — BP 139/83 | HR 61 | Temp 98.4°F | Ht 67.75 in | Wt 180.0 lb

## 2017-02-15 DIAGNOSIS — J4520 Mild intermittent asthma, uncomplicated: Secondary | ICD-10-CM | POA: Diagnosis not present

## 2017-02-15 DIAGNOSIS — D72829 Elevated white blood cell count, unspecified: Secondary | ICD-10-CM

## 2017-02-15 DIAGNOSIS — B20 Human immunodeficiency virus [HIV] disease: Secondary | ICD-10-CM

## 2017-02-15 DIAGNOSIS — Z23 Encounter for immunization: Secondary | ICD-10-CM | POA: Diagnosis not present

## 2017-02-15 DIAGNOSIS — Z0000 Encounter for general adult medical examination without abnormal findings: Secondary | ICD-10-CM

## 2017-02-15 DIAGNOSIS — R6889 Other general symptoms and signs: Secondary | ICD-10-CM | POA: Diagnosis not present

## 2017-02-15 LAB — CBC WITH DIFFERENTIAL/PLATELET
Basophils Absolute: 148 cells/uL (ref 0–200)
Basophils Relative: 2 %
Eosinophils Absolute: 296 cells/uL (ref 15–500)
Eosinophils Relative: 4 %
HCT: 40.9 % (ref 35.0–45.0)
Hemoglobin: 13.4 g/dL (ref 11.7–15.5)
Lymphocytes Relative: 37 %
Lymphs Abs: 2738 cells/uL (ref 850–3900)
MCH: 30.4 pg (ref 27.0–33.0)
MCHC: 32.8 g/dL (ref 32.0–36.0)
MCV: 92.7 fL (ref 80.0–100.0)
MPV: 9.9 fL (ref 7.5–12.5)
Monocytes Absolute: 666 cells/uL (ref 200–950)
Monocytes Relative: 9 %
Neutro Abs: 3552 cells/uL (ref 1500–7800)
Neutrophils Relative %: 48 %
Platelets: 277 10*3/uL (ref 140–400)
RBC: 4.41 MIL/uL (ref 3.80–5.10)
RDW: 14.1 % (ref 11.0–15.0)
WBC: 7.4 10*3/uL (ref 3.8–10.8)

## 2017-02-15 NOTE — Progress Notes (Signed)
RFV: follow up for HIV disease  [name], female   DOB: December 22, 1950, 66 y.o.   MRN: [id number]  HPI Maria Adkins is a 66yo F with well controlled HIV disease, CD 4 count of 593/VL 22 on tivicay/descovy.also pmhx of asthma, allergies, and HTN. She was recently seen by pulmonology as well as PCP. Had episode of asthma exacerbation in April for which she has followed up with pulmonology who have weaned her off of steroids and inhalers. She is feeling well. No issues with SOB, or wheezing.   Outpatient Encounter Prescriptions as of 02/15/2017  Medication Sig  . dolutegravir (TIVICAY) 50 MG tablet Take 1 tablet (50 mg total) by mouth daily.  [name] emtricitabine-rilpivir-tenofovir AF (ODEFSEY) 200-25-25 MG TABS tablet Take 1 tablet by mouth daily with breakfast.  . enalapril-hydrochlorothiazide (VASERETIC) 10-25 MG tablet Take 1 tablet by mouth daily.  . metoprolol (LOPRESSOR) 100 MG tablet Take 1 tablet (100 mg total) by mouth daily.   No facility-administered encounter medications on file as of 02/15/2017.      Patient Active Problem List   Diagnosis Date Noted  . Asthma 02/05/2017  . Allergy 12/16/2016  . 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 Essential hypertension 08/19/2016  . Human immunodeficiency virus (HIV) disease ([place]) 08/19/2016     There are no preventive care reminders to display for this patient.   Review of Systems Review of Systems  Constitutional: Negative for fever, chills, diaphoresis, activity change, appetite change, fatigue and unexpected weight change.  HENT: Negative for congestion, sore throat, rhinorrhea, sneezing, trouble swallowing and sinus pressure.  Eyes: Negative for photophobia and visual disturbance.  Respiratory: Negative for cough, chest tightness, shortness of breath, wheezing and stridor.  Cardiovascular: Negative for chest pain, palpitations and leg swelling.  Gastrointestinal: Negative for nausea, vomiting, abdominal pain, diarrhea,  constipation, blood in stool, abdominal distention and anal bleeding.  Genitourinary: Negative for dysuria, hematuria, flank pain and difficulty urinating.  Musculoskeletal: Negative for myalgias, back pain, joint swelling, arthralgias and gait problem.  Skin: Negative for color change, pallor, rash and wound.  Neurological: Negative for dizziness, tremors, weakness and light-headedness.  Hematological: Negative for adenopathy. Does not bruise/bleed easily.  Psychiatric/Behavioral: Negative for behavioral problems, confusion, sleep disturbance, dysphoric mood, decreased concentration and agitation.    Physical Exam   BP 139/83   Pulse 61   Temp 98.4 F (36.9 C)   Ht 5' 7.75" (1.721 m)   Wt 180 lb (81.6 kg)   BMI 27.57 kg/m   Physical Exam  Constitutional:  oriented to person, place, and time. appears well-developed and well-nourished. No distress.  HENT: [place]/AT, PERRLA, no scleral icterus Mouth/Throat: Oropharynx is clear and moist. No oropharyngeal exudate.  Cardiovascular: Normal rate, regular rhythm and normal heart sounds. Exam reveals no gallop and no friction rub.  No murmur heard.  Pulmonary/Chest: Effort normal and breath sounds normal. No respiratory distress.  has no wheezes.  Neck = supple, no nuchal rigidity Abdominal: Soft. Bowel sounds are normal.  exhibits no distension. There is no tenderness.  Lymphadenopathy: no cervical adenopathy. No axillary adenopathy Neurological: alert and oriented to person, place, and time.  Skin: Skin is warm and dry. No rash noted. No erythema.  Psychiatric: a normal mood and affect.  behavior is normal.   Lab Results  Component Value Date   CD4TCELL 24 (L) 08/19/2016   CD4TCELL 24 (L) 08/19/2016   No results found for: CD4TABS Lab Results  Component Value Date   HIV1RNAQUANT 22 (H) 11/12/2016  Lab Results  Component Value Date   HEPBSAB POS (A) 09/24/2016   No results found for: RPR, LABRPR  CBC Lab Results  Component  Value Date   WBC 16.4 (H) 01/13/2017   RBC 4.62 01/13/2017   HGB 14.6 01/13/2017   HCT 43.3 01/13/2017   PLT 249 01/13/2017   MCV 93.7 01/13/2017   MCH 31.6 01/13/2017   MCHC 33.7 01/13/2017   RDW 13.4 01/13/2017   LYMPHSABS 2.0 01/13/2017   MONOABS 2.1 (H) 01/13/2017   EOSABS 0.2 01/13/2017    BMET Lab Results  Component Value Date   NA 135 01/13/2017   K 3.0 (L) 01/13/2017   CL 96 (L) 01/13/2017   CO2 31 01/13/2017   GLUCOSE 145 (H) 01/13/2017   BUN 10 01/13/2017   CREATININE 1.10 (H) 01/13/2017   CALCIUM 8.6 (L) 01/13/2017   GFRNONAA 51 (L) 01/13/2017   GFRAA 59 (L) 01/13/2017      Assessment and Plan   hiv disease = wel controlled continue on current regimen  Health maintenance = needs pneumonia vac  Leukocytosis = will recheck to see that it is improved  Asthma = currently not having any flares. Continue with current plan for management of exacerbation if it recurs

## 2017-03-15 ENCOUNTER — Ambulatory Visit: Payer: 59

## 2017-04-07 ENCOUNTER — Other Ambulatory Visit: Payer: Self-pay | Admitting: Internal Medicine

## 2017-04-14 ENCOUNTER — Other Ambulatory Visit: Payer: Self-pay | Admitting: Internal Medicine

## 2017-04-14 DIAGNOSIS — Z1231 Encounter for screening mammogram for malignant neoplasm of breast: Secondary | ICD-10-CM

## 2017-04-19 ENCOUNTER — Other Ambulatory Visit: Payer: Self-pay | Admitting: Internal Medicine

## 2017-04-19 DIAGNOSIS — B20 Human immunodeficiency virus [HIV] disease: Secondary | ICD-10-CM

## 2017-04-21 ENCOUNTER — Ambulatory Visit (INDEPENDENT_AMBULATORY_CARE_PROVIDER_SITE_OTHER): Payer: 59 | Admitting: Internal Medicine

## 2017-04-21 ENCOUNTER — Encounter: Payer: Self-pay | Admitting: Internal Medicine

## 2017-04-21 VITALS — BP 122/84 | HR 80 | Temp 98.0°F | Ht 67.75 in | Wt 179.0 lb

## 2017-04-21 DIAGNOSIS — H6121 Impacted cerumen, right ear: Secondary | ICD-10-CM | POA: Diagnosis not present

## 2017-04-21 DIAGNOSIS — R222 Localized swelling, mass and lump, trunk: Secondary | ICD-10-CM | POA: Diagnosis not present

## 2017-04-21 DIAGNOSIS — M810 Age-related osteoporosis without current pathological fracture: Secondary | ICD-10-CM

## 2017-04-21 DIAGNOSIS — E2839 Other primary ovarian failure: Secondary | ICD-10-CM

## 2017-04-21 MED ORDER — ZOSTER VAC RECOMB ADJUVANTED 50 MCG/0.5ML IM SUSR: 0.5000 mL | Freq: Once | INTRAMUSCULAR | 0 refills | Status: AC

## 2017-04-21 NOTE — Progress Notes (Signed)
[name], female   DOB: [birth year], 66 y.o.   MRN: [id number]    Location:  [facility] Place of Service: OFFICE  Chief Complaint  Patient presents with  . Acute Visit    Problems hearing out of right ear x 2 weeks, tried OTC ear drops.   . Health Maintenance    Mammogram scheduled for 04/29/17, Order pending for DEXA  . Medication Refill    No refills needed today     HPI:  66 yo female seen today for 2 week hx hearing loss in right ear. She used OTC ear gtts BID x several days without relief. She has tinnitus. No ear pain or d/c. No f/c, sore throat, HA, dizziness.   She is also c/a lump in right abdomen she noticed about 1 month ago. No pain but it appears to have moved further towards midline. She has similar area left forearm. She has hx HIV and is currently on HAART tx.  Past Medical History:  Diagnosis Date  . Asthma   . High blood pressure   . HIV (human immunodeficiency virus infection) ([place])   . Osteoarthritis     Past Surgical History:  Procedure Laterality Date  . Biopsy of Liver    . CATARACT EXTRACTION, BILATERAL    . CHOLECYSTECTOMY    . CYST REMOVAL HAND Left   . LAPAROSCOPIC SALPINGOOPHERECTOMY Right     Patient Care Team: [name], DO as PCP - General (Internal Medicine)  Social History   Social History  . Marital status: Married    Spouse name: N/A  . Number of children: N/A  . Years of education: N/A   Occupational History  . Not on file.   Social History Main Topics  . Smoking status: Former Smoker    Packs/day: 1.00    Years: 40.00    Types: Cigarettes  . Smokeless tobacco: Never Used  . Alcohol use No  . Drug use: No  . Sexual activity: Yes    Partners: Male    Birth control/ protection: Condom     Comment: declined condoms   Other Topics Concern  . Not on file   Social History Narrative   Diet? Regular      Do you drink/eat things with caffeine? Coffee      Marital status?  Married                                   What year were you married? March 16, 2009      Do you live in a house, apartment, assisted living, condo, trailer, etc.? House      Is it one or more stories? 1 story      How many persons live in your home? 2      Do you have any pets in your home? (please list) yes-dog      Current or past profession: Administrator      Do you exercise?  no                                    Type & how often? n/a      Do you have a living will? no      Do you have a DNR form?     no  If not, do you want to discuss one? Yes      Do you have signed POA/HPOA for forms? no           reports that she has quit smoking. Her smoking use included Cigarettes. She has a 40.00 pack-year smoking history. She has never used smokeless tobacco. She reports that she does not drink alcohol or use drugs.  Family History  Problem Relation Age of Onset  . Diabetes Mother        died at age 52  . Hypertension Mother   . Asthma Sister   . Diabetes Sister   . Hypertension Sister   . Arthritis Sister   . Diabetes Sister   . Hypertension Sister   . Asthma Sister   . Arthritis Sister   . Asthma Son        controlled   Family Status  Relation Status  . Mother Deceased  . Father Deceased  . Sister Alive  . Sister Alive  . Sister Alive  . Daughter Alive  . Son Alive     No Known Allergies  Medications: Patient's Medications  New Prescriptions   No medications on file  Previous Medications   ENALAPRIL-HYDROCHLOROTHIAZIDE (VASERETIC) 10-25 MG TABLET    Take 1 tablet by mouth daily.   METOPROLOL TARTRATE (LOPRESSOR) 100 MG TABLET    TAKE 1 TABLET (100 MG TOTAL) BY MOUTH DAILY.   ODEFSEY 200-25-25 MG TABS TABLET    TAKE 1 TABLET BY MOUTH DAILY WITH BREAKFAST   TIVICAY 50 MG TABLET    TAKE 1 TABLET (50MG) BY MOUTH DAILY  Modified Medications   Modified Medication Previous Medication   ZOSTER VAC RECOMB ADJUVANTED (SHINGRIX) INJECTION Zoster Vac Recomb Adjuvanted  (SHINGRIX) injection      Inject 0.5 mLs into the muscle once.    Inject 0.5 mLs into the muscle once.  Discontinued Medications   No medications on file    Review of Systems  HENT: Positive for hearing loss.   All other systems reviewed and are negative.   Vitals:   04/21/17 1334  BP: 122/84  Pulse: 80  Temp: 98 F (36.7 C)  TempSrc: Oral  SpO2: 97%  Weight: 179 lb (81.2 kg)  Height: 5' 7.75" (1.721 m)   Body mass index is 27.42 kg/m.  Physical Exam  Constitutional: She is oriented to person, place, and time.  Looks well in NAD  HENT:  Mouth/Throat: No oropharyngeal exudate.  Left TM appears dull and intact. Increased cerumen right external ear canal and unable to visualize TM. Oropharynx cobblestoning but no redness/exudate. No oral thrush  Eyes: Pupils are equal, round, and reactive to light. No scleral icterus.  Abdominal: She exhibits no distension. There is no hepatomegaly. There is no tenderness. There is no rebound and no guarding.    Musculoskeletal: She exhibits edema.  Neurological: She is alert and oriented to person, place, and time.  Skin: Skin is warm and dry. No rash noted.  Psychiatric: She has a normal mood and affect. Her behavior is normal. Judgment and thought content normal.     Labs reviewed: Office Visit on 02/15/2017  Component Date Value Ref Range Status  . WBC 02/15/2017 7.4  3.8 - 10.8 K/uL Final  . RBC 02/15/2017 4.41  3.80 - 5.10 MIL/uL Final  . Hemoglobin 02/15/2017 13.4  11.7 - 15.5 g/dL Final  . HCT 02/15/2017 40.9  35.0 - 45.0 % Final  . MCV 02/15/2017 92.7  80.0 - 100.0 fL  Final  . MCH 02/15/2017 30.4  27.0 - 33.0 pg Final  . MCHC 02/15/2017 32.8  32.0 - 36.0 g/dL Final  . RDW 02/15/2017 14.1  11.0 - 15.0 % Final  . Platelets 02/15/2017 277  140 - 400 K/uL Final  . MPV 02/15/2017 9.9  7.5 - 12.5 fL Final  . Neutro Abs 02/15/2017 3552  1,500 - 7,800 cells/uL Final  . Lymphs Abs 02/15/2017 2738  850 - 3,900 cells/uL Final  .  Monocytes Absolute 02/15/2017 666  200 - 950 cells/uL Final  . Eosinophils Absolute 02/15/2017 296  15 - 500 cells/uL Final  . Basophils Absolute 02/15/2017 148  0 - 200 cells/uL Final  . Neutrophils Relative % 02/15/2017 48  % Final  . Lymphocytes Relative 02/15/2017 37  % Final  . Monocytes Relative 02/15/2017 9  % Final  . Eosinophils Relative 02/15/2017 4  % Final  . Basophils Relative 02/15/2017 2  % Final  . Smear Review 02/15/2017 Criteria for review not met   Final    No results found.   Assessment/Plan   ICD-10-CM   1. Hearing loss due to cerumen impaction, right H61.21   2. Estrogen deficiency E28.39 DG Bone Density  3. Age related osteoporosis, unspecified pathological fracture presence M81.0 DG Bone Density  4. Abdominal wall lump R22.2 US Abdomen Limited     Ear lavaged unsuccessfully. currette and forceps used to extract impacted cerumen form right external ear canal. No bleeding. Hearing loss resolved with removal of impaction. TM intact with no redness/bulging  Abdominal US to further characterize subcut lump  Cont current meds as ordered  Will call with results  Follow up as scheduled  Nakima Fluegge S. Perlie Gold  [facility] [address] [place], [place] [address] [phone] Cell (Monday-Friday 8 AM - 5 PM) [phone] After 5 PM and follow prompts

## 2017-04-29 ENCOUNTER — Ambulatory Visit: Payer: 59

## 2017-05-07 ENCOUNTER — Encounter: Payer: Self-pay | Admitting: Internal Medicine

## 2017-05-11 ENCOUNTER — Ambulatory Visit
Admission: RE | Admit: 2017-05-11 | Discharge: 2017-05-11 | Disposition: A | Discharge status: Home / Self Care | Payer: 59 | Source: Ambulatory Visit | Attending: Internal Medicine | Admitting: Internal Medicine

## 2017-05-11 ENCOUNTER — Ambulatory Visit
Admission: RE | Admit: 2017-05-11 | Discharge: 2017-05-11 | Disposition: A | Discharge status: Home / Self Care | Payer: Medicare Other | Source: Ambulatory Visit | Attending: Internal Medicine | Admitting: Internal Medicine

## 2017-05-11 DIAGNOSIS — M8589 Other specified disorders of bone density and structure, multiple sites: Secondary | ICD-10-CM | POA: Diagnosis not present

## 2017-05-11 DIAGNOSIS — Z1231 Encounter for screening mammogram for malignant neoplasm of breast: Secondary | ICD-10-CM | POA: Diagnosis not present

## 2017-05-11 DIAGNOSIS — Z780 Asymptomatic menopausal state: Secondary | ICD-10-CM | POA: Diagnosis not present

## 2017-05-11 DIAGNOSIS — R222 Localized swelling, mass and lump, trunk: Secondary | ICD-10-CM

## 2017-05-11 DIAGNOSIS — M810 Age-related osteoporosis without current pathological fracture: Secondary | ICD-10-CM

## 2017-05-11 DIAGNOSIS — E2839 Other primary ovarian failure: Secondary | ICD-10-CM

## 2017-05-11 DIAGNOSIS — R1905 Periumbilic swelling, mass or lump: Secondary | ICD-10-CM | POA: Diagnosis not present

## 2017-05-14 ENCOUNTER — Telehealth: Payer: Self-pay

## 2017-05-14 NOTE — Telephone Encounter (Signed)
Medication list updated to reflect recommendation of provider.

## 2017-05-14 NOTE — Telephone Encounter (Signed)
-----   Message from [place], [place] sent at 05/12/2017  3:59 PM EDT ----- She has low bone density and is at increased risk of fragility fracture - recommend you start Ca2+ with D 1200mg  daily (please check with Dr Baxter Flattery to ensure no interaction with meds) and low impact aerobic exercise as tolerated.  Repeat bone density in 2 yrs

## 2017-06-17 ENCOUNTER — Encounter: Payer: Self-pay | Admitting: Internal Medicine

## 2017-06-17 ENCOUNTER — Ambulatory Visit (INDEPENDENT_AMBULATORY_CARE_PROVIDER_SITE_OTHER): Payer: 59 | Admitting: Internal Medicine

## 2017-06-17 VITALS — BP 133/89 | HR 56 | Temp 97.4°F | Ht 67.75 in | Wt 183.0 lb

## 2017-06-17 DIAGNOSIS — I10 Essential (primary) hypertension: Secondary | ICD-10-CM

## 2017-06-17 DIAGNOSIS — Z23 Encounter for immunization: Secondary | ICD-10-CM | POA: Diagnosis not present

## 2017-06-17 DIAGNOSIS — M8580 Other specified disorders of bone density and structure, unspecified site: Secondary | ICD-10-CM

## 2017-06-17 DIAGNOSIS — B20 Human immunodeficiency virus [HIV] disease: Secondary | ICD-10-CM | POA: Diagnosis not present

## 2017-06-17 DIAGNOSIS — Z780 Asymptomatic menopausal state: Secondary | ICD-10-CM

## 2017-06-17 DIAGNOSIS — M810 Age-related osteoporosis without current pathological fracture: Secondary | ICD-10-CM | POA: Diagnosis not present

## 2017-06-17 MED ORDER — CALCIUM CARBONATE-VITAMIN D 500-200 MG-UNIT PO TABS: 2.0000 | ORAL_TABLET | Freq: Every day | ORAL | 5 refills | Status: DC

## 2017-06-17 NOTE — Progress Notes (Signed)
RFV: Follow up with HIV disease  [name], female   DOB: Feb 23, 1951, 66 y.o.   MRN: [id number]  HPI  Maria Adkins is a 66yo F with HIV disease, well controlled,and HTN on tivicay-descovy. She is not missing any doses. Over the summer had mammo and dexa scan showing osteopenia. She was given calcium supplementation but has not started taking concern for interaction with hiv meds. Otherwise is in good health Outpatient Encounter Prescriptions as of 06/17/2017  Medication Sig  . enalapril-hydrochlorothiazide (VASERETIC) 10-25 MG tablet Take 1 tablet by mouth daily.  . metoprolol tartrate (LOPRESSOR) 100 MG tablet TAKE 1 TABLET (100 MG TOTAL) BY MOUTH DAILY.  [name] ODEFSEY 200-25-25 MG TABS tablet TAKE 1 TABLET BY MOUTH DAILY WITH BREAKFAST  . TIVICAY 50 MG tablet TAKE 1 TABLET (50MG ) BY MOUTH DAILY  . calcium carbonate (CALTRATE 600) 1500 (600 Ca) MG TABS tablet Take 1,200 mg by mouth daily.   No facility-administered encounter medications on file as of 06/17/2017.      Patient Active Problem List   Diagnosis Date Noted  . Asthma 02/05/2017  . Allergy 12/16/2016  . 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 Essential hypertension 08/19/2016  . Human immunodeficiency virus (HIV) disease ([place]) 08/19/2016     Health Maintenance Due  Topic Date Due  . INFLUENZA VACCINE  04/14/2017    [place]x: non smoker, no alcohol  Review of Systems  Constitutional: Negative for fever, chills, diaphoresis, activity change, appetite change, fatigue and unexpected weight change.  HENT: Negative for congestion, sore throat, rhinorrhea, sneezing, trouble swallowing and sinus pressure.  Eyes: Negative for photophobia and visual disturbance.  Respiratory: Negative for cough, chest tightness, shortness of breath, wheezing and stridor.  Cardiovascular: Negative for chest pain, palpitations and leg swelling.  Gastrointestinal: Negative for nausea, vomiting, abdominal pain, diarrhea, constipation, blood  in stool, abdominal distention and anal bleeding.  Genitourinary: Negative for dysuria, hematuria, flank pain and difficulty urinating.  Musculoskeletal: Negative for myalgias, back pain, joint swelling, arthralgias and gait problem.  Skin: Negative for color change, pallor, rash and wound.  Neurological: Negative for dizziness, tremors, weakness and light-headedness.  Hematological: Negative for adenopathy. Does not bruise/bleed easily.  Psychiatric/Behavioral: + difficulty with sleeping    Physical Exam   BP 133/89   Pulse (!) 56   Temp (!) 97.4 F (36.3 C) (Oral)   Ht 5' 7.75" (1.721 m)   Wt 183 lb (83 kg)   BMI 28.03 kg/m   Physical Exam  Constitutional:  oriented to person, place, and time. appears well-developed and well-nourished. No distress.  HENT: [place]/AT, PERRLA, no scleral icterus Mouth/Throat: Oropharynx is clear and moist. No oropharyngeal exudate.  Cardiovascular: Normal rate, regular rhythm and normal heart sounds. Exam reveals no gallop and no friction rub.  No murmur heard.  Pulmonary/Chest: Effort normal and breath sounds normal. No respiratory distress.  has no wheezes.  Neck = supple, no nuchal rigidity Lymphadenopathy: no cervical adenopathy. No axillary adenopathy Neurological: alert and oriented to person, place, and time.  Skin: Skin is warm and dry. No rash noted. No erythema.  Psychiatric: a normal mood and affect.  behavior is normal.   Lab Results  Component Value Date   CD4TCELL 24 (L) 08/19/2016   CD4TCELL 24 (L) 08/19/2016   No results found for: CD4TABS Lab Results  Component Value Date   HIV1RNAQUANT 22 (H) 11/12/2016   Lab Results  Component Value Date   HEPBSAB POS (A) 09/24/2016   CBC Lab Results  Component  Value Date   WBC 7.4 02/15/2017   RBC 4.41 02/15/2017   HGB 13.4 02/15/2017   HCT 40.9 02/15/2017   PLT 277 02/15/2017   MCV 92.7 02/15/2017   MCH 30.4 02/15/2017   MCHC 32.8 02/15/2017   RDW 14.1 02/15/2017   LYMPHSABS  2,738 02/15/2017   MONOABS 666 02/15/2017   EOSABS 296 02/15/2017    BMET Lab Results  Component Value Date   NA 135 01/13/2017   K 3.0 (L) 01/13/2017   CL 96 (L) 01/13/2017   CO2 31 01/13/2017   GLUCOSE 145 (H) 01/13/2017   BUN 10 01/13/2017   CREATININE 1.10 (H) 01/13/2017   CALCIUM 8.6 (L) 01/13/2017   GFRNONAA 51 (L) 01/13/2017   GFRAA 59 (L) 01/13/2017    Assessment and Plan   hiv disease = will check labs. If still non detectable consider switch to biktarvy at next appt  Osteopenia = will switch to calcium-vit D supplementation 2 tabs for total of 1000mg  ca+/ 800 iu vit d  htn = well controlled, at goal  Insomnia = appears to have good sleep hygiene. Will do a trial of melatonin  Health maintenance =will give flu shot today

## 2017-06-17 NOTE — Patient Instructions (Signed)
Please take calcium-vit d at bedtime  Can get melatonin at the drug store to see if that will help with sleeping

## 2017-06-18 LAB — T-HELPER CELL (CD4) - (RCID CLINIC ONLY)
CD4 % Helper T Cell: 18 % — ABNORMAL LOW (ref 33–55)
CD4 T Cell Abs: 610 /uL (ref 400–2700)

## 2017-06-20 LAB — CBC WITH DIFFERENTIAL/PLATELET
Basophils Absolute: 91 cells/uL (ref 0–200)
Basophils Relative: 1.4 %
Eosinophils Absolute: 208 cells/uL (ref 15–500)
Eosinophils Relative: 3.2 %
HCT: 39.7 % (ref 35.0–45.0)
Hemoglobin: 13.8 g/dL (ref 11.7–15.5)
Lymphs Abs: 2854 cells/uL (ref 850–3900)
MCH: 30.9 pg (ref 27.0–33.0)
MCHC: 34.8 g/dL (ref 32.0–36.0)
MCV: 89 fL (ref 80.0–100.0)
MPV: 10.1 fL (ref 7.5–12.5)
Monocytes Relative: 10.3 %
Neutro Abs: 2678 cells/uL (ref 1500–7800)
Neutrophils Relative %: 41.2 %
Platelets: 249 10*3/uL (ref 140–400)
RBC: 4.46 10*6/uL (ref 3.80–5.10)
RDW: 13.7 % (ref 11.0–15.0)
Total Lymphocyte: 43.9 %
WBC mixed population: 670 cells/uL (ref 200–950)
WBC: 6.5 10*3/uL (ref 3.8–10.8)

## 2017-06-20 LAB — VITAMIN D 1,25 DIHYDROXY
Vitamin D 1, 25 (OH)2 Total: 66 pg/mL (ref 18–72)
Vitamin D2 1, 25 (OH)2: <8 pg/mL
Vitamin D3 1, 25 (OH)2: 66 pg/mL

## 2017-06-20 LAB — BASIC METABOLIC PANEL
BUN: 11 mg/dL (ref 7–25)
CO2: 32 mmol/L (ref 20–32)
Calcium: 9.5 mg/dL (ref 8.6–10.4)
Chloride: 99 mmol/L (ref 98–110)
Creat: 0.94 mg/dL (ref 0.50–0.99)
Glucose, Bld: 84 mg/dL (ref 65–99)
Potassium: 3.5 mmol/L (ref 3.5–5.3)
Sodium: 138 mmol/L (ref 135–146)

## 2017-06-21 LAB — HIV-1 RNA QUANT-NO REFLEX-BLD
HIV 1 RNA Quant: <20 copies/mL
HIV-1 RNA Quant, Log: <1.3 Log copies/mL

## 2017-07-14 ENCOUNTER — Other Ambulatory Visit: Payer: Self-pay | Admitting: Internal Medicine

## 2017-07-14 DIAGNOSIS — I10 Essential (primary) hypertension: Secondary | ICD-10-CM

## 2017-08-09 ENCOUNTER — Ambulatory Visit: Payer: Medicare Other | Admitting: Adult Health

## 2017-08-10 DIAGNOSIS — H524 Presbyopia: Secondary | ICD-10-CM | POA: Diagnosis not present

## 2017-08-13 ENCOUNTER — Other Ambulatory Visit: Payer: Self-pay | Admitting: Internal Medicine

## 2017-08-13 DIAGNOSIS — B20 Human immunodeficiency virus [HIV] disease: Secondary | ICD-10-CM

## 2017-08-13 DIAGNOSIS — I10 Essential (primary) hypertension: Secondary | ICD-10-CM

## 2017-08-13 DIAGNOSIS — Z79899 Other long term (current) drug therapy: Secondary | ICD-10-CM

## 2017-08-16 ENCOUNTER — Other Ambulatory Visit: Payer: Medicare Other

## 2017-08-16 DIAGNOSIS — I10 Essential (primary) hypertension: Secondary | ICD-10-CM | POA: Diagnosis not present

## 2017-08-16 DIAGNOSIS — B20 Human immunodeficiency virus [HIV] disease: Secondary | ICD-10-CM

## 2017-08-16 DIAGNOSIS — Z79899 Other long term (current) drug therapy: Secondary | ICD-10-CM | POA: Diagnosis not present

## 2017-08-17 LAB — URINALYSIS, ROUTINE W REFLEX MICROSCOPIC
Bacteria, UA: NONE SEEN /HPF
Bilirubin Urine: NEGATIVE
Glucose, UA: NEGATIVE
Hyaline Cast: NONE SEEN /LPF
Nitrite: NEGATIVE
Protein, ur: NEGATIVE
Specific Gravity, Urine: 1.027 (ref 1.001–1.03)
WBC, UA: NONE SEEN /HPF (ref 0–5)
pH: <=5 (ref 5.0–8.0)

## 2017-08-17 LAB — LIPID PANEL
Cholesterol: 219 mg/dL — ABNORMAL HIGH (ref <200)
HDL: 45 mg/dL — ABNORMAL LOW (ref >50)
LDL Cholesterol (Calc): 147 mg/dL (calc) — ABNORMAL HIGH
Non-HDL Cholesterol (Calc): 174 mg/dL (calc) — ABNORMAL HIGH (ref <130)
Total CHOL/HDL Ratio: 4.9 (calc) (ref <5.0)
Triglycerides: 145 mg/dL (ref <150)

## 2017-08-17 LAB — HEPATIC FUNCTION PANEL
AG Ratio: 1.6 (calc) (ref 1.0–2.5)
ALT: 14 U/L (ref 6–29)
AST: 19 U/L (ref 10–35)
Albumin: 4.2 g/dL (ref 3.6–5.1)
Alkaline phosphatase (APISO): 44 U/L (ref 33–130)
Bilirubin, Direct: 0.2 mg/dL (ref 0.0–0.2)
Globulin: 2.7 g/dL (calc) (ref 1.9–3.7)
Indirect Bilirubin: 1.1 mg/dL (calc) (ref 0.2–1.2)
Total Bilirubin: 1.3 mg/dL — ABNORMAL HIGH (ref 0.2–1.2)
Total Protein: 6.9 g/dL (ref 6.1–8.1)

## 2017-08-17 LAB — TSH: TSH: 1.43 mIU/L (ref 0.40–4.50)

## 2017-08-18 ENCOUNTER — Encounter: Payer: Self-pay | Admitting: Internal Medicine

## 2017-08-18 ENCOUNTER — Other Ambulatory Visit (INDEPENDENT_AMBULATORY_CARE_PROVIDER_SITE_OTHER): Payer: 59 | Admitting: Internal Medicine

## 2017-08-18 VITALS — BP 130/94 | HR 64 | Temp 97.8°F | Ht 68.0 in | Wt 182.4 lb

## 2017-08-18 DIAGNOSIS — R17 Unspecified jaundice: Secondary | ICD-10-CM | POA: Diagnosis not present

## 2017-08-18 DIAGNOSIS — I10 Essential (primary) hypertension: Secondary | ICD-10-CM

## 2017-08-18 DIAGNOSIS — J4540 Moderate persistent asthma, uncomplicated: Secondary | ICD-10-CM | POA: Diagnosis not present

## 2017-08-18 DIAGNOSIS — B20 Human immunodeficiency virus [HIV] disease: Secondary | ICD-10-CM | POA: Diagnosis not present

## 2017-08-18 DIAGNOSIS — E782 Mixed hyperlipidemia: Secondary | ICD-10-CM | POA: Diagnosis not present

## 2017-08-18 NOTE — Progress Notes (Signed)
[name], female   DOB: 24-Sep-1950, 66 y.o.   MRN: [id number]   Location:  [facility]  Provider: DR Arletha Grippe  Code Status: FULL CODE Goals of Care:  Advanced Directives 02/12/2017  Does Patient Have a Medical Advance Directive? No  Type of Advance Directive -  Does patient want to make changes to medical advance directive? -  Copy of [place] in Chart? -     Chief Complaint  Patient presents with  . Medical Management of Chronic Issues    6 months for asthma and HTN    HPI: Patient is a 66 y.o. female seen today for medical management of chronic diseases. No asthma attacks and has not req'd rescue inhaler. She had trouble sleeping and started melatonin. She took for about 1 month and has not had any issues since. She noticed she has been developing purple spots - 1 on right neck and another on left arm. No pain or itching. She experienced similar sx's with prednisone but she is not taking anything to exacerbates.  Labs reviewed with pt - LDL 147 (prev 122); Tchol 219; HDL 45; Total bilirubin 1.3. She s/p cholecystectomy.   Reviewed her medications - Tivicay ADRs include hyperbilirubinemia  Hx asthma - well controlled. Rarely uses prn HFA. She has had moderate persistent sx's in past and was rx advair 100/50, flonase, singulair, proventil hfa. no longer followed by pulmonary Dr Corrie Dandy  HIV - controlled. HIV1 RNA quant UNDETECTABLE and log UNDETECTABLE (prev quant 22 with log of 1.34). CD4 count 610 (prev 594). Currently takes tivicay (dolutegravir) and odefsey (emtricitabine-rilpivir-tenofovir). Followed by ID Dr Baxter Flattery  HTN - stable on metoprolol succ and enalapril hct. Cr 0.94; K 3.5  Osteoporosis - stable on fosamax. DXA in Aug 2018 revealed osteopenia.  Remote tobacco abuse hx   Past Medical History:  Diagnosis Date  . Asthma   . High blood pressure   . HIV (human immunodeficiency virus infection) ([place])   . Osteoarthritis      Past Surgical History:  Procedure Laterality Date  . Biopsy of Liver    . CATARACT EXTRACTION, BILATERAL    . CHOLECYSTECTOMY    . CYST REMOVAL HAND Left   . LAPAROSCOPIC SALPINGOOPHERECTOMY Right     No Known Allergies  Outpatient Encounter Medications as of 08/18/2017  Medication Sig  . calcium-vitamin D (OSCAL WITH D) 500-200 MG-UNIT tablet Take 2 tablets by mouth at bedtime.  . enalapril-hydrochlorothiazide (VASERETIC) 10-25 MG tablet TAKE 1 TABLET BY MOUTH DAILY.  . metoprolol tartrate (LOPRESSOR) 100 MG tablet TAKE 1 TABLET (100 MG TOTAL) BY MOUTH DAILY.  [name] ODEFSEY 200-25-25 MG TABS tablet TAKE 1 TABLET BY MOUTH DAILY WITH BREAKFAST  . TIVICAY 50 MG tablet TAKE 1 TABLET (50MG ) BY MOUTH DAILY   No facility-administered encounter medications on file as of 08/18/2017.     Review of Systems:  Review of Systems  Respiratory: Negative for cough, shortness of breath and wheezing.   Musculoskeletal: Negative for arthralgias.  Skin: Positive for rash.  All other systems reviewed and are negative.   Health Maintenance  Topic Date Due  . TETANUS/TDAP  09/23/2017 (Originally 04/07/1970)  . PNA vac Low Risk Adult (2 of 2 - PCV13) 02/15/2018  . MAMMOGRAM  05/12/2019  . COLONOSCOPY  11/03/2023  . INFLUENZA VACCINE  Completed  . DEXA SCAN  Completed  . Hepatitis C Screening  Completed    Physical Exam: Vitals:   08/18/17 3244  BP: (!) 130/94  Pulse: 64  Temp: 97.8 F (36.6 C)  TempSrc: Oral  SpO2: 98%  Weight: 182 lb 6.4 oz (82.7 kg)  Height: 5\' 8"  (1.727 m)   Body mass index is 27.73 kg/m. Physical Exam  Constitutional: She is oriented to person, place, and time. She appears well-developed and well-nourished.  Looks well in NAD  HENT:  Mouth/Throat: Oropharynx is clear and moist. No oropharyngeal exudate.  MMM; no oral thrush  Eyes: Pupils are equal, round, and reactive to light. No scleral icterus.  Neck: Neck supple. Carotid bruit is not present. No tracheal  deviation present. No thyromegaly present.  Cardiovascular: Normal rate, regular rhythm, normal heart sounds and intact distal pulses. Exam reveals no gallop and no friction rub.  No murmur heard. No LE edema b/l. no calf TTP.   Pulmonary/Chest: Effort normal and breath sounds normal. No stridor. No respiratory distress. She has no wheezes. She has no rales.  Abdominal: Soft. Normal appearance and bowel sounds are normal. She exhibits no distension and no mass. There is no hepatomegaly. There is no tenderness. There is no rigidity, no rebound and no guarding. No hernia.  obese  Lymphadenopathy:    She has no cervical adenopathy.  Neurological: She is alert and oriented to person, place, and time.  Skin: Skin is warm and dry. No rash noted.  No palpable purpura appreciated. Left arm with eraser sized dark appearing round nodule, NT, non blanching; no jaundice  Psychiatric: She has a normal mood and affect. Her behavior is normal. Judgment and thought content normal.    Labs reviewed: Basic Metabolic Panel: Recent Labs    09/09/16 0850  01/03/17 1730 01/13/17 0145 06/17/17 1419 08/16/17 0811  NA  --    < > 140 135 138  --   K  --    < > 3.2* 3.0* 3.5  --   CL  --    < > 99* 96* 99  --   CO2  --    < > 32 31 32  --   GLUCOSE  --    < > 131* 145* 84  --   BUN  --    < > 12 10 11   --   CREATININE  --    < > 1.16* 1.10* 0.94  --   CALCIUM  --    < > 9.6 8.6* 9.5  --   TSH 0.92  --   --   --   --  1.43   < > = values in this interval not displayed.   Liver Function Tests: Recent Labs    08/19/16 0954 09/24/16 1117 01/03/17 1730 08/16/17 0811  AST 21 23 22 19   ALT 15 19 19 14   ALKPHOS 57 49 50  --   BILITOT 0.9 1.0 0.8 1.3*  PROT 6.7 6.8 7.5 6.9  ALBUMIN 4.1 3.9 4.1  --    No results for input(s): LIPASE, AMYLASE in the last 8760 hours. No results for input(s): AMMONIA in the last 8760 hours. CBC: Recent Labs    01/13/17 0145 02/15/17 1536 06/17/17 1419  WBC 16.4* 7.4  6.5  NEUTROABS 12.1* 3,552 2,678  HGB 14.6 13.4 13.8  HCT 43.3 40.9 39.7  MCV 93.7 92.7 89.0  PLT 249 277 249   Lipid Panel: Recent Labs    09/09/16 0850 08/16/17 0811  CHOL 184 219*  HDL 44* 45*  LDLCALC 122*  --   TRIG 91 145  CHOLHDL 4.2 4.9  No results found for: HGBA1C  Procedures since last visit: No results found.  Assessment/Plan   ICD-10-CM   1. Mixed hyperlipidemia - NEW E78.2   2. Total bilirubin, elevated - possibly related to Tivicay R17   3. Essential hypertension I10   4. Moderate persistent asthma without complication P37.90   5. Human immunodeficiency virus (HIV) disease ([place]) B20    Will add fractionated bilirubin to determine direct/indirect bilirubin  May need to redraw bilirubin at ID appt next month  Cont current meds as ordered  Increase exercise as tolerated 30-45 minutes at least 4-5 times per week  Watch fatty foods in diet  Follow up as scheduled with Dr Baxter Flattery  Follow up in 6 mos for HTN, hyperlipidemia and HIV. Will need lipid panel, cmp at next [facility] S. Perlie Gold  [facility] [address] [place], [place] [address] [phone] Cell (Monday-Friday 8 AM - 5 PM) [phone] After 5 PM and follow prompts

## 2017-08-18 NOTE — Patient Instructions (Signed)
Increase exercise as tolerated 30-45 minutes at least 4-5 times per week  Watch fatty foods in diet  Follow up as scheduled with Dr Baxter Flattery  Follow up in 6 mos for HTN, hyperlipidemia and HIV

## 2017-09-12 ENCOUNTER — Encounter: Payer: Self-pay | Admitting: Family Medicine

## 2017-09-12 ENCOUNTER — Ambulatory Visit: Payer: Self-pay | Admitting: Family Medicine

## 2017-09-12 VITALS — BP 115/80 | HR 59 | Temp 97.6°F | Resp 16 | Wt 189.4 lb

## 2017-09-12 DIAGNOSIS — Z789 Other specified health status: Secondary | ICD-10-CM

## 2017-09-12 DIAGNOSIS — J019 Acute sinusitis, unspecified: Secondary | ICD-10-CM

## 2017-09-12 MED ORDER — FLUCONAZOLE 150 MG PO TABS: 150.0000 mg | ORAL_TABLET | Freq: Once | ORAL | 0 refills | Status: AC

## 2017-09-12 MED ORDER — CARBAMIDE PEROXIDE 6.5 % OT SOLN: 5.0000 [drp] | Freq: Two times a day (BID) | OTIC | 2 refills | Status: DC

## 2017-09-12 MED ORDER — AMOXICILLIN-POT CLAVULANATE 875-125 MG PO TABS: 1.0000 | ORAL_TABLET | Freq: Two times a day (BID) | ORAL | 0 refills | Status: DC

## 2017-09-12 NOTE — Patient Instructions (Addendum)
For acute sinusitis I am starting you on Augmentin 875-125 mg twice daily times 10 days.  I have also prescribed Diflucan take 1 tablet 3 days after starting antibiotic to prevent vaginal yeast infection.  Continue use of albuterol inhaler as needed for shortness of breath or wheezing.   For acute cough you may take over-the-counter Delsym, 10 ml every 12 hours as needed.  If symptoms worsen or if you develop fever follow-up with your PCP or go immediately to the ED or any urgent care for further evaluation.  For cerumen impaction I have prescribed Debrox drops use daily as needed to reduce cerumen impaction.     Sinusitis, Adult Sinusitis is soreness and inflammation of your sinuses. Sinuses are hollow spaces in the bones around your face. They are located:  Around your eyes.  In the middle of your forehead.  Behind your nose.  In your cheekbones.  Your sinuses and nasal passages are lined with a stringy fluid (mucus). Mucus normally drains out of your sinuses. When your nasal tissues get inflamed or swollen, the mucus can get trapped or blocked so air cannot flow through your sinuses. This lets bacteria, viruses, and funguses grow, and that leads to infection. Follow these instructions at home: Medicines  Take, use, or apply over-the-counter and prescription medicines only as told by your doctor. These may include nasal sprays.  If you were prescribed an antibiotic medicine, take it as told by your doctor. Do not stop taking the antibiotic even if you start to feel better. Hydrate and Humidify  Drink enough water to keep your pee (urine) clear or pale yellow.  Use a cool mist humidifier to keep the humidity level in your home above 50%.  Breathe in steam for 10-15 minutes, 3-4 times a day or as told by your doctor. You can do this in the bathroom while a hot shower is running.  Try not to spend time in cool or dry air. Rest  Rest as much as possible.  Sleep with your head  raised (elevated).  Make sure to get enough sleep each night. General instructions  Put a warm, moist washcloth on your face 3-4 times a day or as told by your doctor. This will help with discomfort.  Wash your hands often with soap and water. If there is no soap and water, use hand sanitizer.  Do not smoke. Avoid being around people who are smoking (secondhand smoke).  Keep all follow-up visits as told by your doctor. This is important. Contact a doctor if:  You have a fever.  Your symptoms get worse.  Your symptoms do not get better within 10 days. Get help right away if:  You have a very bad headache.  You cannot stop throwing up (vomiting).  You have pain or swelling around your face or eyes.  You have trouble seeing.  You feel confused.  Your neck is stiff.  You have trouble breathing. This information is not intended to replace advice given to you by your health care provider. Make sure you discuss any questions you have with your health care provider. Document Released: 02/17/2008 Document Revised: 04/26/2016 Document Reviewed: 06/26/2015 Elsevier Interactive Patient Education  [employer].

## 2017-09-12 NOTE — Progress Notes (Signed)
Subjective:  Maria Adkins is a 66 y.o. female, history of controlled asthma, hypertension, and HIV,  who presents for evaluation of possible sinusitis and ear pain.  Recently exposed to sick grandson. Symptoms include congestion, sinus pressure, mild SOB with activity, and left ear pain.  Onset of symptoms was 2 days ago, and has worsened since that time.  She has only used short acting inhaler for symptoms of shortness of breath which improved with treatment. High risk factors for influenza complications:  immunocompromised and co-morbid illness.  The following portions of the patient's history were reviewed and updated as appropriate:  allergies, current medications and past medical history.  Constitutional: positive for mildy fatigue. Eyes: negative Ears, nose, mouth, throat, and face: positive for earaches and nasal congestion Respiratory: positive for cough and dyspnea on exertion Cardiovascular: negative Neurological: positive for headaches Objective:  BP 115/80 (BP Location: Right Arm, Patient Position: Sitting, Cuff Size: Normal)   Pulse (!) 59   Temp 97.6 F (36.4 C) (Oral)   Resp 16   Wt 189 lb 6.4 oz (85.9 kg)   SpO2 96%   BMI 28.80 kg/m  General appearance: alert, cooperative and appears stated age Eyes: conjunctivae/corneas clear. PERRL, EOM's intact.  Ears: normal TM's and thick cerumen accumulation present although TM both visible Nose: Nares normal. Septum midline. Mucosa normal. No drainage or sinus tenderness., mild congestion, sinus tenderness right, mucosal edema right nares Throat: normal findings: lips normal without lesions Lungs: clear to auscultation bilaterally Heart: regular rate and rhythm, S1, S2 normal, no murmur, click, rub or gallop Abdomen: soft, non-tender; bowel sounds normal; no masses,  no organomegaly Pulses: 2+ and symmetric Skin: Skin color, texture, turgor normal. No rashes or lesions    Assessment:  sinusitis    Plan:  Discussed  diagnosis and treatment of sinusitis. Augmentin per orders, Delsym for cough, Debrox drops to loosen cerumen. Follow up as needed or if symptoms worsen or do not improve with PCP.  Carroll Sage. Kenton Kingfisher, MSN, FNP-C [address] # [place]  [place], [place] [address] [phone]

## 2017-09-13 ENCOUNTER — Other Ambulatory Visit: Payer: Self-pay | Admitting: *Deleted

## 2017-09-13 DIAGNOSIS — B20 Human immunodeficiency virus [HIV] disease: Secondary | ICD-10-CM

## 2017-09-13 MED ORDER — DOLUTEGRAVIR SODIUM 50 MG PO TABS: ORAL_TABLET | ORAL | 4 refills | Status: DC

## 2017-09-15 ENCOUNTER — Telehealth: Payer: Self-pay

## 2017-09-15 NOTE — Telephone Encounter (Signed)
I left a message asking the patient to call us back. 

## 2017-09-16 ENCOUNTER — Other Ambulatory Visit: Payer: Self-pay | Admitting: Internal Medicine

## 2017-09-16 DIAGNOSIS — B20 Human immunodeficiency virus [HIV] disease: Secondary | ICD-10-CM

## 2017-09-21 ENCOUNTER — Ambulatory Visit (INDEPENDENT_AMBULATORY_CARE_PROVIDER_SITE_OTHER): Payer: 59 | Admitting: Internal Medicine

## 2017-09-21 ENCOUNTER — Telehealth: Payer: Self-pay | Admitting: Pharmacist

## 2017-09-21 ENCOUNTER — Encounter: Payer: Self-pay | Admitting: Internal Medicine

## 2017-09-21 VITALS — BP 151/87 | HR 72 | Temp 98.1°F | Ht 68.0 in | Wt 187.0 lb

## 2017-09-21 DIAGNOSIS — B20 Human immunodeficiency virus [HIV] disease: Secondary | ICD-10-CM

## 2017-09-21 DIAGNOSIS — Z23 Encounter for immunization: Secondary | ICD-10-CM | POA: Diagnosis not present

## 2017-09-21 DIAGNOSIS — I10 Essential (primary) hypertension: Secondary | ICD-10-CM | POA: Diagnosis not present

## 2017-09-21 LAB — COMPLETE METABOLIC PANEL WITH GFR
AG Ratio: 1.5 (calc) (ref 1.0–2.5)
ALT: 17 U/L (ref 6–29)
AST: 16 U/L (ref 10–35)
Albumin: 4.2 g/dL (ref 3.6–5.1)
Alkaline phosphatase (APISO): 54 U/L (ref 33–130)
BUN: 18 mg/dL (ref 7–25)
CO2: 32 mmol/L (ref 20–32)
Calcium: 9.7 mg/dL (ref 8.6–10.4)
Chloride: 101 mmol/L (ref 98–110)
Creat: 0.96 mg/dL (ref 0.50–0.99)
GFR, Est African American: 70 mL/min/{1.73_m2} (ref >=60)
GFR, Est Non African American: 61 mL/min/{1.73_m2} (ref >=60)
Globulin: 2.8 g/dL (calc) (ref 1.9–3.7)
Glucose, Bld: 101 mg/dL — ABNORMAL HIGH (ref 65–99)
Potassium: 3.9 mmol/L (ref 3.5–5.3)
Sodium: 140 mmol/L (ref 135–146)
Total Bilirubin: 0.6 mg/dL (ref 0.2–1.2)
Total Protein: 7 g/dL (ref 6.1–8.1)

## 2017-09-21 MED ORDER — BICTEGRAVIR-EMTRICITAB-TENOFOV 50-200-25 MG PO TABS: 1.0000 | ORAL_TABLET | Freq: Every day | ORAL | 11 refills | Status: DC

## 2017-09-21 NOTE — Progress Notes (Signed)
[facility] for Infectious Disease Pharmacy Visit  HPI: Maria Adkins is a 67 y.o. female who presents to the [facility] clinic today to follow-up with Dr. Baxter Flattery for her HIV infection.  Patient Active Problem List   Diagnosis Date Noted  . Asthma 02/05/2017  . Allergy 12/16/2016  . 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 Essential hypertension 08/19/2016  . Human immunodeficiency virus (HIV) disease ([place]) 08/19/2016      Medication List        Accurate as of 09/21/17  9:09 AM. Always use your most recent med list.          amoxicillin-clavulanate 875-125 MG tablet Commonly known as:  AUGMENTIN Take 1 tablet by mouth 2 (two) times daily.   bictegravir-emtricitabine-tenofovir AF 50-200-25 MG Tabs tablet Commonly known as:  BIKTARVY Take 1 tablet by mouth daily.   calcium-vitamin D 500-200 MG-UNIT tablet Commonly known as:  OSCAL WITH D Take 2 tablets by mouth at bedtime.   carbamide peroxide 6.5 % OTIC solution Commonly known as:  DEBROX Place 5 drops into both ears 2 (two) times daily.   enalapril-hydrochlorothiazide 10-25 MG tablet Commonly known as:  VASERETIC TAKE 1 TABLET BY MOUTH DAILY.   Fluticasone Furoate 100 MCG/ACT Aepb   fluticasone-salmeterol 115-21 MCG/ACT inhaler Commonly known as:  ADVAIR HFA   Melatonin 1 MG/4ML Liqd   metoprolol tartrate 100 MG tablet Commonly known as:  LOPRESSOR TAKE 1 TABLET (100 MG TOTAL) BY MOUTH DAILY.       Where to Get Your Medications    These medications were sent to [facility], [place]  [place], [place] [address]   Phone:  [phone]   bictegravir-emtricitabine-tenofovir AF 50-200-25 MG Tabs tablet     Allergies: No Known Allergies  Past Medical History: Past Medical History:  Diagnosis Date  . Asthma   . High blood pressure   . HIV (human immunodeficiency virus infection) ([place])   . Osteoarthritis     Social History: Social History    Socioeconomic History  . Marital status: Married    Spouse name: None  . Number of children: None  . Years of education: None  . Highest education level: None  Social Needs  . Financial resource strain: None  . Food insecurity - worry: None  . Food insecurity - inability: None  . Transportation needs - medical: None  . Transportation needs - non-medical: None  Occupational History  . None  Tobacco Use  . Smoking status: Former Smoker    Packs/day: 1.00    Years: 40.00    Pack years: 40.00    Types: Cigarettes  . Smokeless tobacco: Never Used  Substance and Sexual Activity  . Alcohol use: No  . Drug use: No  . Sexual activity: Yes    Partners: Male    Birth control/protection: Condom    Comment: declined condoms  Other Topics Concern  . None  Social History Narrative   Diet? Regular      Do you drink/eat things with caffeine? Coffee      Marital status?  Married                                  What year were you married? March 16, 2009      Do you live in a house, apartment, assisted living, condo, trailer, etc.? House  Is it one or more stories? 1 story      How many persons live in your home? 2      Do you have any pets in your home? (please list) yes-dog      Current or past profession: Administrator      Do you exercise?  no                                    Type & how often? n/a      Do you have a living will? no      Do you have a DNR form?     no                             If not, do you want to discuss one? Yes      Do you have signed POA/HPOA for forms? no       Labs: HIV 1 RNA Quant (copies/mL)  Date Value  06/17/2017 <20 NOT DETECTED  11/12/2016 22 (H)   CD4 T Cell Abs (/uL)  Date Value  06/17/2017 610   Hep B S Ab (no units)  Date Value  09/24/2016 POS (A)   Hepatitis B Surface Ag (no units)  Date Value  09/24/2016 NEGATIVE   HCV Ab (no units)  Date Value  09/24/2016 NEGATIVE    Lipids:    Component Value Date/Time     CHOL 219 (H) 08/16/2017 0811   TRIG 145 08/16/2017 0811   HDL 45 (L) 08/16/2017 0811   CHOLHDL 4.9 08/16/2017 0811   VLDL 18 09/09/2016 0850   LDLCALC 122 (H) 09/09/2016 0850    Current HIV Regimen: Tivicay/Odefsey  Assessment: Maria Adkins is here today to follow-up with Dr. Baxter Flattery for her HIV infection.  She is doing quite well on her Tivicay and Vernell Leep and Dr. Baxter Flattery would like to simply her regimen to [facility].  We would like to transfer her care to [facility] but we were unable to fill it due to her medication insurance.  [facility] called [facility] and they are able to [facility], so I sent it there.  Counseled her on Biktarvy and told her to separate it from her calcium-vitamin D supplement.  She will take the [place] in the morning and the calcium-vitamin D in the evening.  She had a few questions regarding side effects.  I told her the most common side effects were headaches and nausea, but since she tolerated Odefsey and Tivicay so well, she would probably tolerate this just fine.    Plan: - Stop Tivicay and Odefsey - Start Biktarvy PO once daily  Koah Chisenhall L. Shylo Zamor, PharmD, [place], [place] for Infectious Disease 09/21/2017, 9:09 AM

## 2017-09-21 NOTE — Patient Instructions (Signed)
You new medication, biktarvy, will be sent to you by mail from the [facility] pharmacy. Once you receive it you can stop taking tivicay and odefsey  Come on back for a lab visit in 6 wk

## 2017-09-21 NOTE — Telephone Encounter (Signed)
We are not able to fill her medications at [facility] at this time due to her [facility]. I [facility] and [facility] are able to [facility] it. [facility] is investigating the issue. I called [facility] and told her the information.

## 2017-09-21 NOTE — Progress Notes (Signed)
[name], female   DOB: 1950-10-14, 67 y.o.   MRN: [id number]  HPI 67yo F with hiv disease, CD4 count of 610/VL<20, currently on tivicay-odefsey. Doing well with adherence. She states that she has not been sick since we last saw her.  Outpatient Encounter Medications as of 09/21/2017  Medication Sig  . calcium-vitamin D (OSCAL WITH D) 500-200 MG-UNIT tablet Take 2 tablets by mouth at bedtime.  . carbamide peroxide (DEBROX) 6.5 % OTIC solution Place 5 drops into both ears 2 (two) times daily.  . dolutegravir (TIVICAY) 50 MG tablet TAKE 1 TABLET (50MG ) BY MOUTH DAILY  . enalapril-hydrochlorothiazide (VASERETIC) 10-25 MG tablet TAKE 1 TABLET BY MOUTH DAILY.  [name] Fluticasone Furoate 100 MCG/ACT AEPB Inhale into the lungs.  . fluticasone-salmeterol (ADVAIR HFA) 115-21 MCG/ACT inhaler Inhale 2 puffs into the lungs 2 (two) times daily.  . Melatonin 1 MG/4ML LIQD Take by mouth.  . metoprolol tartrate (LOPRESSOR) 100 MG tablet TAKE 1 TABLET (100 MG TOTAL) BY MOUTH DAILY.  [name] ODEFSEY 200-25-25 MG TABS tablet TAKE 1 TABLET BY MOUTH DAILY WITH BREAKFAST  . amoxicillin-clavulanate (AUGMENTIN) 875-125 MG tablet Take 1 tablet by mouth 2 (two) times daily. (Patient not taking: Reported on 09/21/2017)   No facility-administered encounter medications on file as of 09/21/2017.      Patient Active Problem List   Diagnosis Date Noted  . Asthma 02/05/2017  . Allergy 12/16/2016  . 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 Essential hypertension 08/19/2016  . Human immunodeficiency virus (HIV) disease ([place]) 08/19/2016   Soc hx: non smoker, non drinker  There are no preventive care reminders to display for this patient.   Review of Systems  Constitutional: Negative for fever, chills, diaphoresis, activity change, appetite change, fatigue and unexpected weight change.  HENT: Negative for congestion, sore throat, rhinorrhea, sneezing, trouble swallowing and sinus pressure.  Eyes: Negative for  photophobia and visual disturbance.  Respiratory: Negative for cough, chest tightness, shortness of breath, wheezing and stridor.  Cardiovascular: Negative for chest pain, palpitations and leg swelling.  Gastrointestinal: Negative for nausea, vomiting, abdominal pain, diarrhea, constipation, blood in stool, abdominal distention and anal bleeding.  Genitourinary: Negative for dysuria, hematuria, flank pain and difficulty urinating.  Musculoskeletal: Negative for myalgias, back pain, joint swelling, arthralgias and gait problem.  Skin: Negative for color change, pallor, rash and wound.  Neurological: Negative for dizziness, tremors, weakness and light-headedness.  Hematological: Negative for adenopathy. Does not bruise/bleed easily.  Psychiatric/Behavioral: Negative for behavioral problems, confusion, sleep disturbance, dysphoric mood, decreased concentration and agitation.    Physical Exam   BP (!) 151/87   Pulse 72   Temp 98.1 F (36.7 C) (Oral)   Ht 5\' 8"  (1.727 m)   Wt 187 lb (84.8 kg)   BMI 28.43 kg/m   Physical Exam  Constitutional:  oriented to person, place, and time. appears well-developed and well-nourished. No distress.  HENT: [place]/AT, PERRLA, no scleral icterus Mouth/Throat: Oropharynx is clear and moist. No oropharyngeal exudate.  Cardiovascular: Normal rate, regular rhythm and normal heart sounds. Exam reveals no gallop and no friction rub.  No murmur heard.  Pulmonary/Chest: Effort normal and breath sounds normal. No respiratory distress.  has no wheezes.  Neck = supple, no nuchal rigidity Abdominal: Soft. Bowel sounds are normal.  exhibits no distension. There is no tenderness.  Lymphadenopathy: no cervical adenopathy. No axillary adenopathy Neurological: alert and oriented to person, place, and time.  Skin: Skin is warm and dry. No rash noted. No  erythema.  Psychiatric: a normal mood and affect.  behavior is normal.   Lab Results  Component Value Date   CD4TCELL 18  (L) 06/17/2017   Lab Results  Component Value Date   CD4TABS 610 06/17/2017   Lab Results  Component Value Date   HIV1RNAQUANT <20 NOT DETECTED 06/17/2017   Lab Results  Component Value Date   HEPBSAB POS (A) 09/24/2016   No results found for: RPR, LABRPR  CBC Lab Results  Component Value Date   WBC 6.5 06/17/2017   RBC 4.46 06/17/2017   HGB 13.8 06/17/2017   HCT 39.7 06/17/2017   PLT 249 06/17/2017   MCV 89.0 06/17/2017   MCH 30.9 06/17/2017   MCHC 34.8 06/17/2017   RDW 13.7 06/17/2017   LYMPHSABS 2,854 06/17/2017   MONOABS 666 02/15/2017   EOSABS 208 06/17/2017    BMET Lab Results  Component Value Date   NA 138 06/17/2017   K 3.5 06/17/2017   CL 99 06/17/2017   CO2 32 06/17/2017   GLUCOSE 84 06/17/2017   BUN 11 06/17/2017   CREATININE 0.94 06/17/2017   CALCIUM 9.5 06/17/2017   GFRNONAA 51 (L) 01/13/2017   GFRAA 59 (L) 01/13/2017      Assessment and Plan  hiv disease = will change to biktarvy., repeat labs in 6 weeks  Health maintenance = tdap today. mammo due in Aug 2019  htn = didn't take meds yet thus uncontrolled at this visit.

## 2017-09-22 LAB — T-HELPER CELL (CD4) - (RCID CLINIC ONLY)
CD4 % Helper T Cell: 17 % — ABNORMAL LOW (ref 33–55)
CD4 T Cell Abs: 560 /uL (ref 400–2700)

## 2017-09-23 LAB — HIV-1 RNA QUANT-NO REFLEX-BLD
HIV 1 RNA Quant: <20 copies/mL — AB
HIV-1 RNA Quant, Log: <1.3 Log copies/mL — AB

## 2017-09-28 ENCOUNTER — Encounter: Payer: Self-pay | Admitting: Internal Medicine

## 2017-10-03 ENCOUNTER — Other Ambulatory Visit: Payer: Self-pay | Admitting: Internal Medicine

## 2017-10-20 IMAGING — CT CT ABD-PELV W/ CM
2 of 5 series · 16 of 46 positions shown, 18 images · IV contrast (APPLIED)
Comparison: None.

CLINICAL DATA: Mid pelvic pain 2 days.  Leukocytosis.

EXAM:
CT ABDOMEN AND PELVIS WITH CONTRAST
TECHNIQUE: Multidetector CT imaging of the abdomen and pelvis was performed
using the standard protocol following bolus administration of
intravenous contrast.
CONTRAST:  100mL EFXQJ4-H88 IOPAMIDOL (EFXQJ4-H88) INJECTION 61%

[Series 2: axial st · axial · 0.86mm/px · z∈[-486,-72]mm · 13 of 93 slices shown, 15 images]
[im 5/93  soft-tissue]
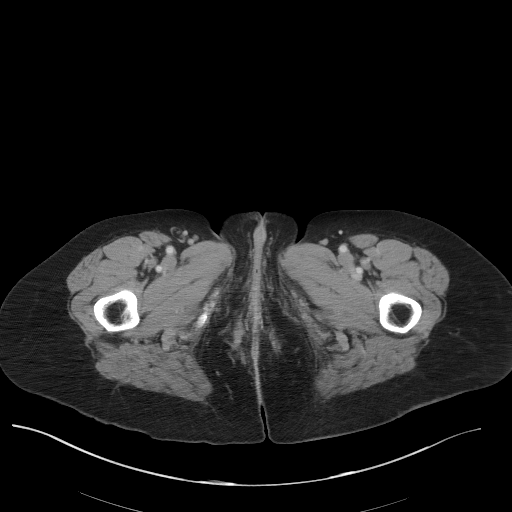
[im 5/93  bone]
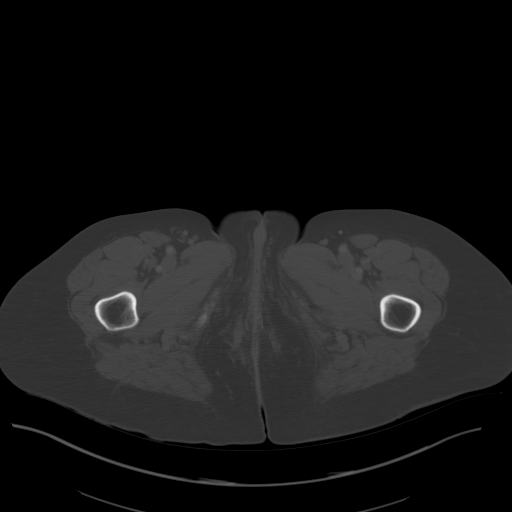
[im 15/93  soft-tissue]
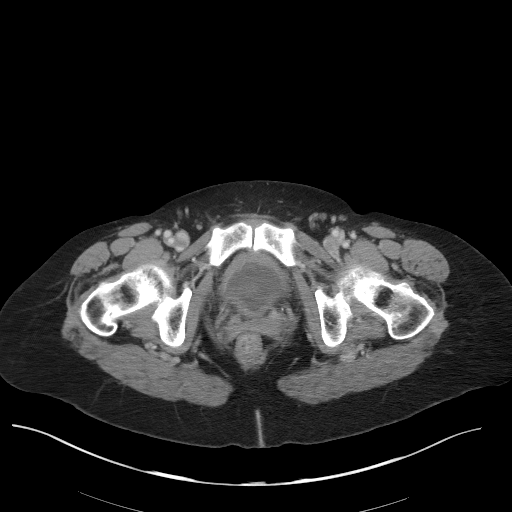
[im 20/93  soft-tissue]
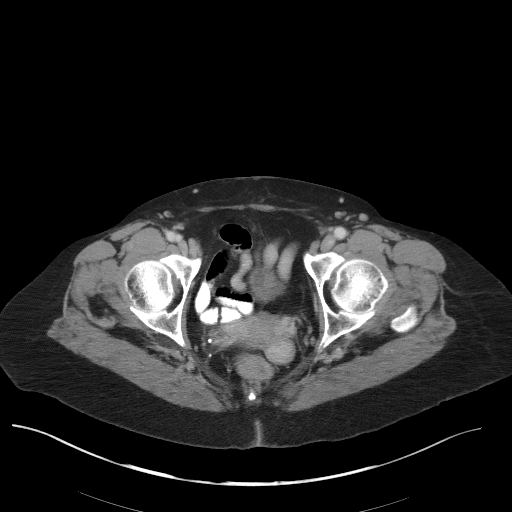
[im 25/93  soft-tissue]
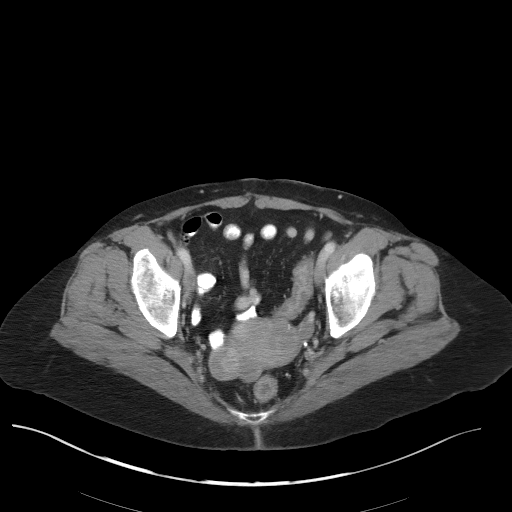
[im 34/93  soft-tissue]
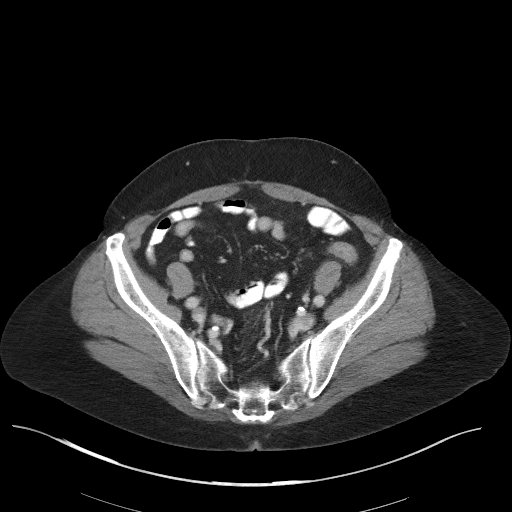
[im 39/93  soft-tissue]
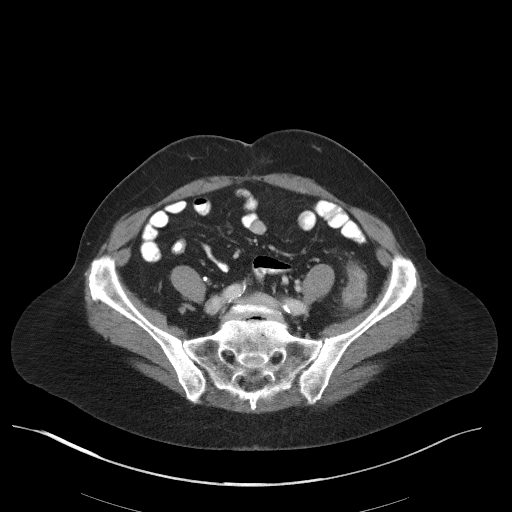
[im 49/93  soft-tissue]
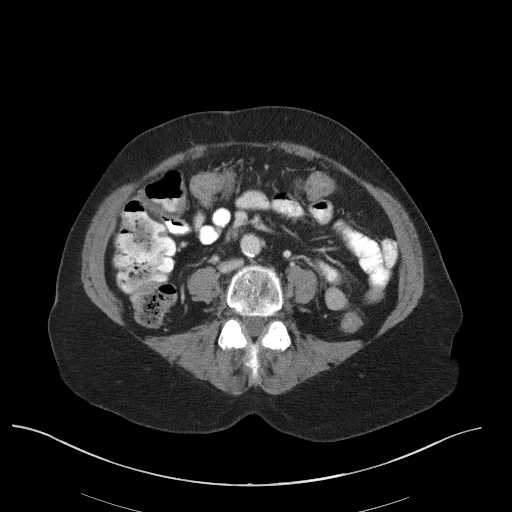
[im 54/93  soft-tissue]
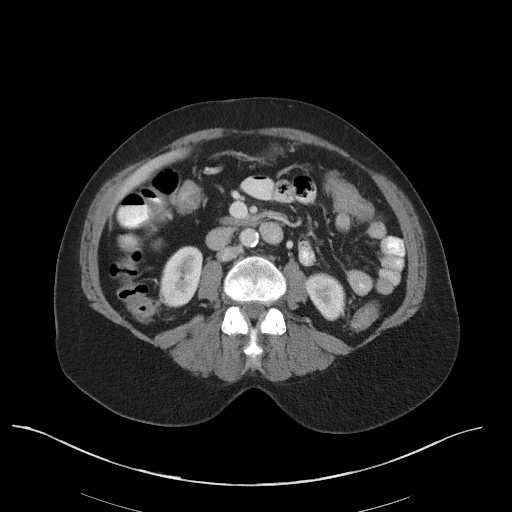
[im 59/93  soft-tissue]
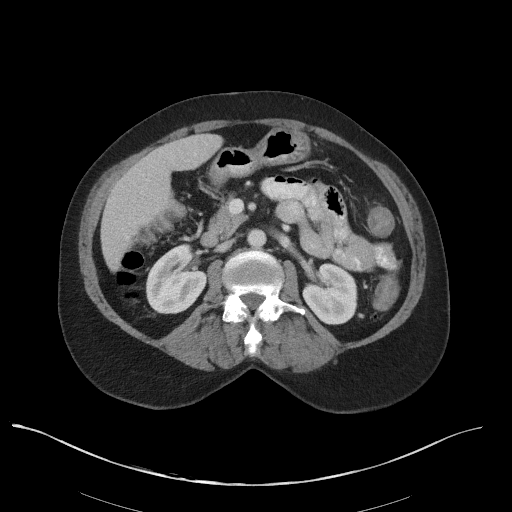
[im 59/93  bone]
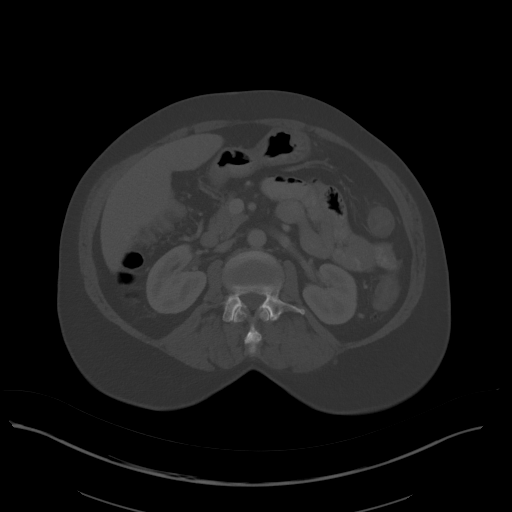
[im 68/93  soft-tissue]
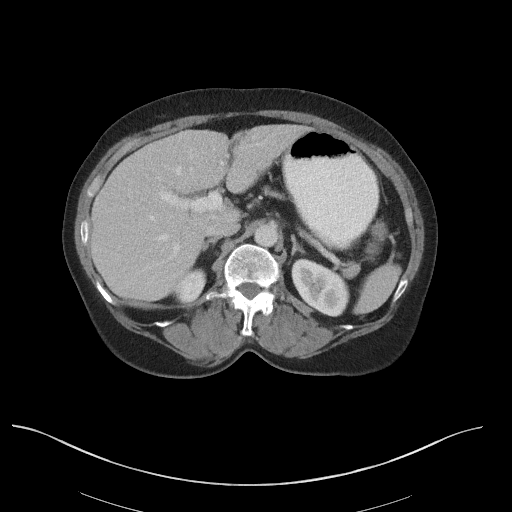
[im 73/93  soft-tissue]
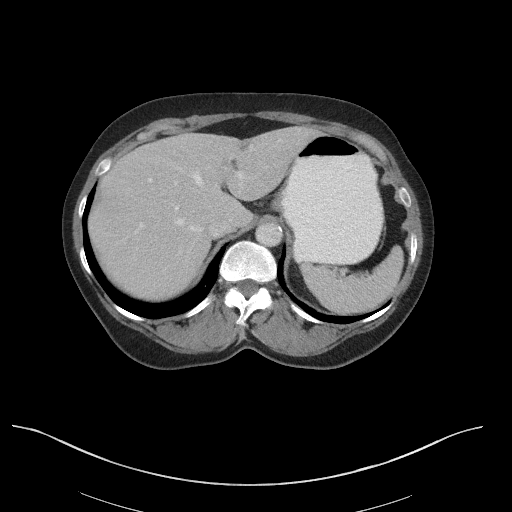
[im 78/93  soft-tissue]
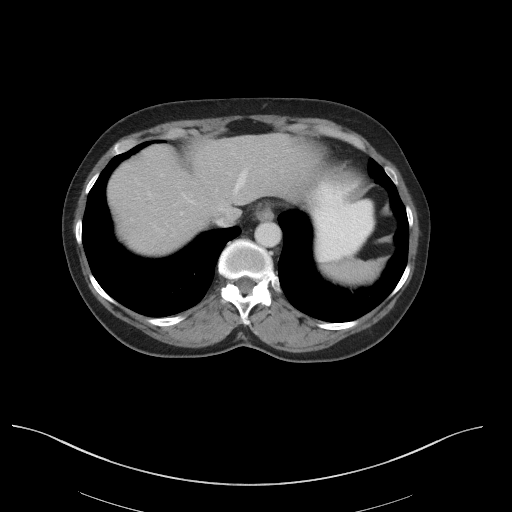
[im 88/93  soft-tissue]
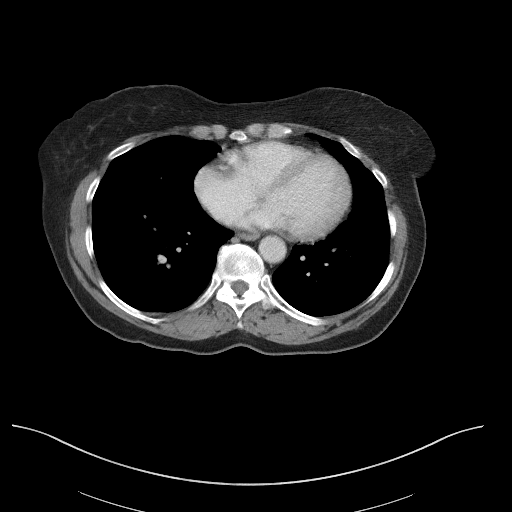

[Series 5: coronal st · coronal · 0.66mm/px · 3 of 95 slices shown]
[im 32/95  soft-tissue]
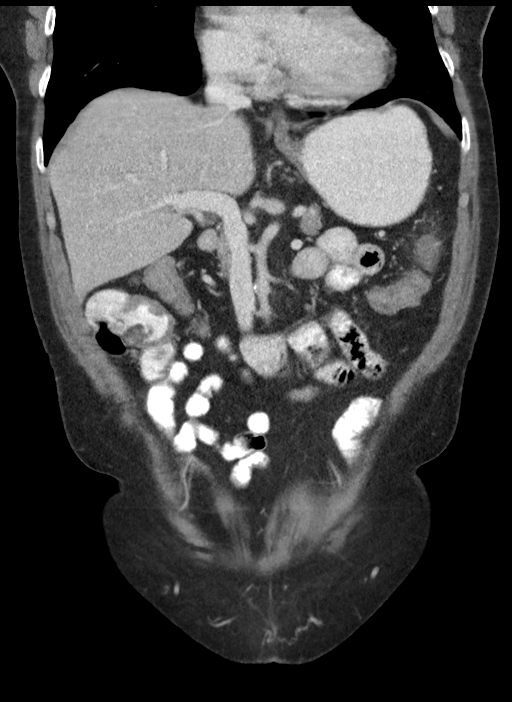
[im 42/95  soft-tissue]
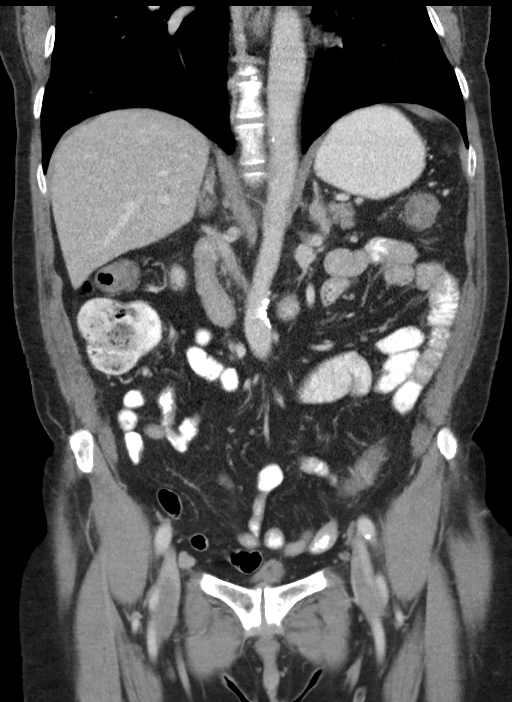
[im 53/95  soft-tissue]
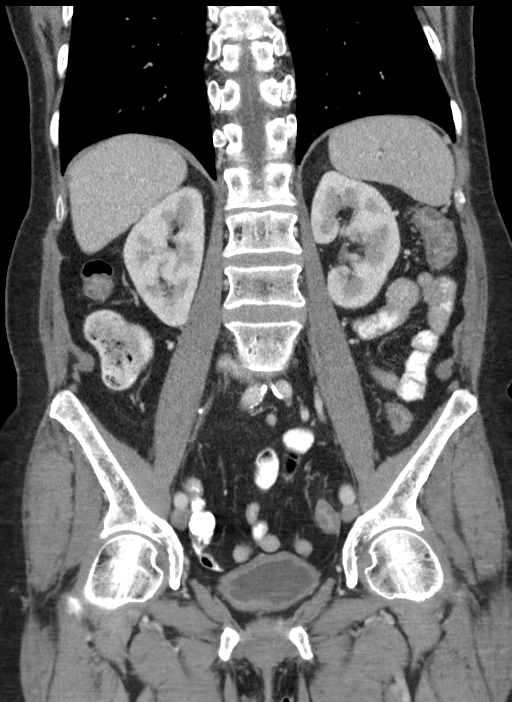

[16 of 46 positions shown; findings below may reference images not displayed]

FINDINGS: Lower chest: No acute abnormality.

Hepatobiliary: No focal liver abnormality is seen. Status post
cholecystectomy. No biliary dilatation.

Pancreas: Unremarkable. No pancreatic ductal dilatation or
surrounding inflammatory changes.

Spleen: Normal in size without focal abnormality.

Adrenals/Urinary Tract: Adrenal glands are unremarkable. Kidneys are
normal, without renal calculi, focal lesion, or hydronephrosis. Mild
urinary bladder mural thickening. This could represent cystitis.

Stomach/Bowel: Stomach is within normal limits. Appendix not visible
but no focal inflammation. No evidence of bowel wall thickening,
distention, or inflammatory changes.

Vascular/Lymphatic: The abdominal aorta is normal in caliber with
moderate atherosclerotic calcification. No adenopathy is evident in
the abdomen or pelvis.

Reproductive: Enlarged multi fibroid uterus. Individual fibroids
measure up to 3 cm. No adnexal masses.

Other: No focal inflammation. No ascites. Small fat containing
umbilical hernia.

Musculoskeletal: No significant skeletal lesion.
IMPRESSION: 1. Moderate urinary bladder mural thickening. This could represent
cystitis.
2. Enlarged multi fibroid uterus
3. Aortic atherosclerosis
4. Small fat containing umbilical hernia.

## 2017-11-02 ENCOUNTER — Other Ambulatory Visit: Payer: Self-pay | Admitting: Internal Medicine

## 2017-11-02 NOTE — Telephone Encounter (Signed)
Ok to RF x 1 - please call pt to schedule f/u appt

## 2017-11-02 NOTE — Telephone Encounter (Signed)
Medication request was routed to provider for approval. Patient has not had an appointment since August 2018.

## 2017-11-12 ENCOUNTER — Ambulatory Visit (INDEPENDENT_AMBULATORY_CARE_PROVIDER_SITE_OTHER): Payer: 59 | Admitting: Internal Medicine

## 2017-11-12 ENCOUNTER — Encounter: Payer: Self-pay | Admitting: Internal Medicine

## 2017-11-12 VITALS — BP 132/88 | HR 61 | Temp 98.1°F | Ht 68.0 in | Wt 184.0 lb

## 2017-11-12 DIAGNOSIS — B20 Human immunodeficiency virus [HIV] disease: Secondary | ICD-10-CM

## 2017-11-12 DIAGNOSIS — J4540 Moderate persistent asthma, uncomplicated: Secondary | ICD-10-CM

## 2017-11-12 DIAGNOSIS — I10 Essential (primary) hypertension: Secondary | ICD-10-CM | POA: Diagnosis not present

## 2017-11-12 NOTE — Progress Notes (Signed)
[name], female   DOB: [birth year], 67 y.o.   MRN: [id number]   Location:  [facility]  Provider: DR Arletha Grippe  Code Status:  Goals of Care:  Advanced Directives 02/12/2017  Does Patient Have a Medical Advance Directive? No  Type of Advance Directive -  Does patient want to make changes to medical advance directive? -  Copy of [place] in Chart? -     Chief Complaint  Patient presents with  . Medical Management of Chronic Issues    7 month follow-u  . Medication Refill    No refills needed     HPI: Patient is a 67 y.o. female seen today for medical management of chronic diseases.  Insomnia improved on melatonin  Hx moderate persistent asthma - well controlled on advair HFA. Rarely uses prn HFA. She has taken flonase and singulair. Followed by pulmonary Dr Corrie Dandy  HIV - stable. HIV1 RNA quant <20 with log of <1.30 in Jan 2019. CD4 count 560. Currently takes biktarvy (bictegravir-emtricitabine-tenofivir) daily. Followed by ID Dr Baxter Flattery  HTN - controlled on metoprolol succ and enalapril hct  Osteoporosis - stable on fosamax. Last DXA 2 yrs ago  Remote tobacco abuse hx   Past Medical History:  Diagnosis Date  . Asthma   . High blood pressure   . HIV (human immunodeficiency virus infection) ([place])   . Osteoarthritis     Past Surgical History:  Procedure Laterality Date  . Biopsy of Liver    . CATARACT EXTRACTION, BILATERAL    . CHOLECYSTECTOMY    . CYST REMOVAL HAND Left   . LAPAROSCOPIC SALPINGOOPHERECTOMY Right      reports that she has quit smoking. Her smoking use included cigarettes. She has a 40.00 pack-year smoking history. she has never used smokeless tobacco. She reports that she does not drink alcohol or use drugs. Social History   Socioeconomic History  . Marital status: Married    Spouse name: Not on file  . Number of children: Not on file  . Years of education: Not on file  . Highest education level: Not on  file  Social Needs  . Financial resource strain: Not on file  . Food insecurity - worry: Not on file  . Food insecurity - inability: Not on file  . Transportation needs - medical: Not on file  . Transportation needs - non-medical: Not on file  Occupational History  . Not on file  Tobacco Use  . Smoking status: Former Smoker    Packs/day: 1.00    Years: 40.00    Pack years: 40.00    Types: Cigarettes  . Smokeless tobacco: Never Used  Substance and Sexual Activity  . Alcohol use: No  . Drug use: No  . Sexual activity: Yes    Partners: Male    Birth control/protection: Condom    Comment: declined condoms  Other Topics Concern  . Not on file  Social History Narrative   Diet? Regular      Do you drink/eat things with caffeine? Coffee      Marital status?  Married                                  What year were you married? March 16, 2009      Do you live in a house, apartment, assisted living, condo, trailer, etc.? House      Is  it one or more stories? 1 story      How many persons live in your home? 2      Do you have any pets in your home? (please list) yes-dog      Current or past profession: Administrator      Do you exercise?  no                                    Type & how often? n/a      Do you have a living will? no      Do you have a DNR form?     no                             If not, do you want to discuss one? Yes      Do you have signed POA/HPOA for forms? no       Family History  Problem Relation Age of Onset  . Diabetes Mother        died at age 28  . Hypertension Mother   . Asthma Sister   . Diabetes Sister   . Hypertension Sister   . Arthritis Sister   . Diabetes Sister   . Hypertension Sister   . Asthma Sister   . Arthritis Sister   . Asthma Son        controlled    No Known Allergies  Outpatient Encounter Medications as of 11/12/2017  Medication Sig  . bictegravir-emtricitabine-tenofovir AF (BIKTARVY) 50-200-25 MG TABS tablet Take 1  tablet by mouth daily.  . carbamide peroxide (DEBROX) 6.5 % OTIC solution Place 5 drops into both ears 2 (two) times daily.  . enalapril-hydrochlorothiazide (VASERETIC) 10-25 MG tablet TAKE 1 TABLET BY MOUTH DAILY.  [name] Fluticasone Furoate 100 MCG/ACT AEPB Inhale into the lungs.  . fluticasone-salmeterol (ADVAIR HFA) 115-21 MCG/ACT inhaler Inhale 2 puffs into the lungs 2 (two) times daily.  . Melatonin 1 MG TABS Take by mouth daily.  . metoprolol tartrate (LOPRESSOR) 100 MG tablet APPOINTMENT DUE, TAKE 1 TABLET BY MOUTH DAILY  . calcium-vitamin D (OSCAL WITH D) 500-200 MG-UNIT tablet Take 2 tablets by mouth at bedtime. (Patient not taking: Reported on 11/12/2017)  . [DISCONTINUED] amoxicillin-clavulanate (AUGMENTIN) 875-125 MG tablet Take 1 tablet by mouth 2 (two) times daily. (Patient not taking: Reported on 09/21/2017)  . [DISCONTINUED] Melatonin 1 MG/4ML LIQD Take by mouth.   No facility-administered encounter medications on file as of 11/12/2017.     Review of Systems:  Review of Systems  Constitutional: Positive for fatigue.  All other systems reviewed and are negative.   Health Maintenance  Topic Date Due  . PNA vac Low Risk Adult (2 of 2 - PCV13) 02/15/2018  . MAMMOGRAM  05/12/2019  . COLONOSCOPY  11/03/2023  . TETANUS/TDAP  09/22/2027  . INFLUENZA VACCINE  Completed  . DEXA SCAN  Completed  . Hepatitis C Screening  Completed    Physical Exam: Vitals:   11/12/17 0839  BP: 132/88  Pulse: 61  Temp: 98.1 F (36.7 C)  TempSrc: Oral  SpO2: 95%  Weight: 184 lb (83.5 kg)  Height: 5\' 8"  (1.727 m)   Body mass index is 27.98 kg/m. Physical Exam  Constitutional: She is oriented to person, place, and time. She appears well-developed and well-nourished.  HENT:  Mouth/Throat: Oropharynx is clear and moist. No  oropharyngeal exudate.  MMM; no oral thrush  Eyes: Pupils are equal, round, and reactive to light. No scleral icterus.  Neck: Neck supple. Carotid bruit is not present. No  tracheal deviation present. No thyromegaly present.  Cardiovascular: Normal rate, regular rhythm, normal heart sounds and intact distal pulses. Exam reveals no gallop and no friction rub.  No murmur heard. No LE edema b/l. no calf TTP.   Pulmonary/Chest: Effort normal and breath sounds normal. No stridor. No respiratory distress. She has no wheezes. She has no rales.  Abdominal: Soft. Normal appearance and bowel sounds are normal. She exhibits no distension and no mass. There is no hepatomegaly. There is no tenderness. There is no rigidity, no rebound and no guarding. No hernia.  Lymphadenopathy:    She has no cervical adenopathy.  Neurological: She is alert and oriented to person, place, and time. She has normal reflexes.  Skin: Skin is warm and dry. No rash noted.  Psychiatric: She has a normal mood and affect. Her behavior is normal. Judgment and thought content normal.    Labs reviewed: Basic Metabolic Panel: Recent Labs    01/13/17 0145 06/17/17 1419 08/16/17 0811 09/21/17 0917  NA 135 138  --  140  K 3.0* 3.5  --  3.9  CL 96* 99  --  101  CO2 31 32  --  32  GLUCOSE 145* 84  --  101*  BUN 10 11  --  18  CREATININE 1.10* 0.94  --  0.96  CALCIUM 8.6* 9.5  --  9.7  TSH  --   --  1.43  --    Liver Function Tests: Recent Labs    01/03/17 1730 08/16/17 0811 09/21/17 0917  AST 22 19 16   ALT 19 14 17   ALKPHOS 50  --   --   BILITOT 0.8 1.3* 0.6  PROT 7.5 6.9 7.0  ALBUMIN 4.1  --   --    No results for input(s): LIPASE, AMYLASE in the last 8760 hours. No results for input(s): AMMONIA in the last 8760 hours. CBC: Recent Labs    01/13/17 0145 02/15/17 1536 06/17/17 1419  WBC 16.4* 7.4 6.5  NEUTROABS 12.1* 3,552 2,678  HGB 14.6 13.4 13.8  HCT 43.3 40.9 39.7  MCV 93.7 92.7 89.0  PLT 249 277 249   Lipid Panel: Recent Labs    08/16/17 0811  CHOL 219*  HDL 45*  TRIG 145  CHOLHDL 4.9   No results found for: HGBA1C  Procedures since last visit: No results  found.  Assessment/Plan   ICD-10-CM   1. Essential hypertension I10   2. Moderate persistent asthma without complication E42.35   3. Human immunodeficiency virus (HIV) disease ([place]) B20     Continue current medications as ordered  Follow up with Dr Baxter Flattery as scheduled  Follow up in 6 mos for HTN and asthma    Maria Adkins  [facility] [address] [place], [place] [address] [phone] Cell (Monday-Friday 8 AM - 5 PM) [phone] After 5 PM and follow prompts

## 2017-11-12 NOTE — Patient Instructions (Signed)
Continue current medications as ordered  Follow up with Dr Baxter Flattery as scheduled  Follow up in 6 mos for HTN and asthma

## 2017-11-30 ENCOUNTER — Ambulatory Visit: Payer: Medicare Other | Admitting: Nurse Practitioner

## 2017-12-07 ENCOUNTER — Encounter: Payer: Self-pay | Admitting: Internal Medicine

## 2017-12-07 ENCOUNTER — Other Ambulatory Visit: Payer: Self-pay | Admitting: Internal Medicine

## 2017-12-07 ENCOUNTER — Ambulatory Visit: Payer: Self-pay | Admitting: Internal Medicine

## 2017-12-07 ENCOUNTER — Ambulatory Visit (INDEPENDENT_AMBULATORY_CARE_PROVIDER_SITE_OTHER): Payer: 59 | Admitting: Internal Medicine

## 2017-12-07 VITALS — BP 122/84 | HR 58 | Temp 97.8°F | Ht 68.0 in | Wt 184.0 lb

## 2017-12-07 VITALS — BP 127/85 | HR 60 | Temp 98.1°F | Ht 67.0 in | Wt 184.0 lb

## 2017-12-07 DIAGNOSIS — J4520 Mild intermittent asthma, uncomplicated: Secondary | ICD-10-CM

## 2017-12-07 DIAGNOSIS — J4540 Moderate persistent asthma, uncomplicated: Secondary | ICD-10-CM | POA: Diagnosis not present

## 2017-12-07 DIAGNOSIS — B20 Human immunodeficiency virus [HIV] disease: Secondary | ICD-10-CM | POA: Diagnosis not present

## 2017-12-07 DIAGNOSIS — H1013 Acute atopic conjunctivitis, bilateral: Secondary | ICD-10-CM | POA: Diagnosis not present

## 2017-12-07 DIAGNOSIS — J302 Other seasonal allergic rhinitis: Secondary | ICD-10-CM

## 2017-12-07 MED ORDER — FLUTICASONE PROPIONATE 50 MCG/ACT NA SUSP: 1.0000 | Freq: Every day | NASAL | 3 refills | Status: DC

## 2017-12-07 MED ORDER — AZELASTINE HCL 0.05 % OP SOLN: 1.0000 [drp] | Freq: Two times a day (BID) | OPHTHALMIC | 3 refills | Status: DC

## 2017-12-07 MED ORDER — MONTELUKAST SODIUM 10 MG PO TABS
10.0000 mg | ORAL_TABLET | Freq: Every day | ORAL | 3 refills | Status: DC
Start: 2017-12-07 — End: 2018-01-24

## 2017-12-07 NOTE — Progress Notes (Signed)
RFV: follow up for hiv disease  [name], female   DOB: 21-Apr-1951, 67 y.o.   MRN: [id number]  HPI [name] is a 67yo F with hiv disease, recently changed to biktarvy. Has been doing well with taking her new medication. No complaints. She likes the ease of once a day pills. She has noticed her allergies and asthma worsening over the last month as the season of spring has started. She is back on taking fluticasone plus advair.  ROS: itchy eyes, otherwise 12 point ros is negative  Outpatient Encounter Medications as of 12/07/2017  Medication Sig  . bictegravir-emtricitabine-tenofovir AF (BIKTARVY) 50-200-25 MG TABS tablet Take 1 tablet by mouth daily.  . enalapril-hydrochlorothiazide (VASERETIC) 10-25 MG tablet TAKE 1 TABLET BY MOUTH DAILY.  [name] Fluticasone Furoate 100 MCG/ACT AEPB Inhale into the lungs.  . fluticasone-salmeterol (ADVAIR HFA) 115-21 MCG/ACT inhaler Inhale 2 puffs into the lungs 2 (two) times daily.  . Melatonin 1 MG TABS Take by mouth daily.  . calcium-vitamin D (OSCAL WITH D) 500-200 MG-UNIT tablet Take 2 tablets by mouth at bedtime. (Patient not taking: Reported on 11/12/2017)  . carbamide peroxide (DEBROX) 6.5 % OTIC solution Place 5 drops into both ears 2 (two) times daily. (Patient not taking: Reported on 12/07/2017)  . metoprolol tartrate (LOPRESSOR) 100 MG tablet TAKE 1 TABLET BY MOUTH EVERY DAY (Patient not taking: Reported on 12/07/2017)   No facility-administered encounter medications on file as of 12/07/2017.      Patient Active Problem List   Diagnosis Date Noted  . Asthma 02/05/2017  . Allergy 12/16/2016  . 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 Essential hypertension 08/19/2016  . Human immunodeficiency virus (HIV) disease ([place]) 08/19/2016     There are no preventive care reminders to display for this patient.    Physical Exam   BP 127/85   Pulse 60   Temp 98.1 F (36.7 C) (Oral)   Ht 5\' 7"  (1.702 m)   Wt 184 lb (83.5 kg)   BMI  28.82 kg/m   Physical Exam  Constitutional:  oriented to person, place, and time. appears well-developed and well-nourished. No distress.  HENT: [place]/AT, PERRLA, no scleral icterus. Mildly injected sclera Mouth/Throat: Oropharynx is clear and moist. No oropharyngeal exudate.  Cardiovascular: Normal rate, regular rhythm and normal heart sounds. Exam reveals no gallop and no friction rub.  No murmur heard.  Pulmonary/Chest: Effort normal and breath sounds normal. No respiratory distress.  has no wheezes.  Neck = supple, no nuchal rigidity Abdominal: Soft. Bowel sounds are normal.  exhibits no distension. There is no tenderness.  Lymphadenopathy: no cervical adenopathy. No axillary adenopathy Neurological: alert and oriented to person, place, and time.  Skin: Skin is warm and dry. No rash noted. No erythema.  Psychiatric: a normal mood and affect.  behavior is normal.   Lab Results  Component Value Date   CD4TCELL 17 (L) 09/21/2017   Lab Results  Component Value Date   CD4TABS 560 09/21/2017   CD4TABS 610 06/17/2017   Lab Results  Component Value Date   HIV1RNAQUANT <20 DETECTED (A) 09/21/2017   Lab Results  Component Value Date   HEPBSAB POS (A) 09/24/2016   No results found for: RPR, LABRPR  CBC Lab Results  Component Value Date   WBC 6.5 06/17/2017   RBC 4.46 06/17/2017   HGB 13.8 06/17/2017   HCT 39.7 06/17/2017   PLT 249 06/17/2017   MCV 89.0 06/17/2017   MCH 30.9 06/17/2017   MCHC  34.8 06/17/2017   RDW 13.7 06/17/2017   LYMPHSABS 2,854 06/17/2017   MONOABS 666 02/15/2017   EOSABS 208 06/17/2017    BMET Lab Results  Component Value Date   NA 140 09/21/2017   K 3.9 09/21/2017   CL 101 09/21/2017   CO2 32 09/21/2017   GLUCOSE 101 (H) 09/21/2017   BUN 18 09/21/2017   CREATININE 0.96 09/21/2017   CALCIUM 9.7 09/21/2017   GFRNONAA 61 09/21/2017   GFRAA 70 09/21/2017      Assessment and Plan  hiv disease = Will check cd 4 count and viral load since  she has been on biktarvy for > 4 wk. Anticipate to still be virologically controlled. Continue on biktarvy  Seasonal allergies/asthma = continue on current regimen. Consider doing a trial of zyrtec to see if any improvement Recommended fresh eyes - eye drops for allergies conjunctivities

## 2017-12-07 NOTE — Patient Instructions (Signed)
START FLONASE 1 SPRAY EACH NOSTRIL DAILY   START SINGULAIR (MONTELUKAST) 10MG  AT BEDTIME  START OPTIVAR EYE DROPS 1 DROP EACH EYE 2 TIMES DAILY X 5 DAYS AS NEEDED FOR ITCHY EYES  Continue other medications as ordered   Follow up as scheduled or sooner if need be

## 2017-12-07 NOTE — Progress Notes (Signed)
[name], female   DOB: [birth year], 67 y.o.   MRN: [id number]   [facility] OFFICE  Provider: DR Arletha Grippe  Code Status: FULL CODE Goals of Care:  Advanced Directives 02/12/2017  Does Patient Have a Medical Advance Directive? No  Type of Advance Directive -  Does patient want to make changes to medical advance directive? -  Copy of [place] in Chart? -     Chief Complaint  Patient presents with  . Acute Visit    Asthma and allergy concerns  . Medication Refill    Refill Advair and Flonase     HPI: Patient is a 67 y.o. female seen today for an acute visit for itching eyes, SOB. She resumed her fluticasone nasal spray and advair which she got from the ED last years. She has nithing for her itchy eyes. Tried visine OTC eye drops without relief. She has taken singulair in the past without a problem. She saw Dr Baxter Flattery this morning and is stable from HIV standpoint. She has a hx asthma  Past Medical History:  Diagnosis Date  . Asthma   . High blood pressure   . HIV (human immunodeficiency virus infection) ([place])   . Osteoarthritis     Past Surgical History:  Procedure Laterality Date  . Biopsy of Liver    . CATARACT EXTRACTION, BILATERAL    . CHOLECYSTECTOMY    . CYST REMOVAL HAND Left   . LAPAROSCOPIC SALPINGOOPHERECTOMY Right      reports that she has quit smoking. Her smoking use included cigarettes. She has a 40.00 pack-year smoking history. She has never used smokeless tobacco. She reports that she does not drink alcohol or use drugs. Social History   Socioeconomic History  . Marital status: Married    Spouse name: Not on file  . Number of children: Not on file  . Years of education: Not on file  . Highest education level: Not on file  Occupational History  . Not on file  Social Needs  . Financial resource strain: Not on file  . Food insecurity:    Worry: Not on file    Inability: Not on file  . Transportation needs:   Medical: Not on file    Non-medical: Not on file  Tobacco Use  . Smoking status: Former Smoker    Packs/day: 1.00    Years: 40.00    Pack years: 40.00    Types: Cigarettes  . Smokeless tobacco: Never Used  Substance and Sexual Activity  . Alcohol use: No  . Drug use: No  . Sexual activity: Yes    Partners: Male    Birth control/protection: Condom    Comment: declined condoms  Lifestyle  . Physical activity:    Days per week: Not on file    Minutes per session: Not on file  . Stress: Not on file  Relationships  . Social connections:    Talks on phone: Not on file    Gets together: Not on file    Attends religious service: Not on file    Active member of club or organization: Not on file    Attends meetings of clubs or organizations: Not on file    Relationship status: Not on file  . Intimate partner violence:    Fear of current or ex partner: Not on file    Emotionally abused: Not on file    Physically abused: Not on file    Forced sexual activity: Not on  file  Other Topics Concern  . Not on file  Social History Narrative   Diet? Regular      Do you drink/eat things with caffeine? Coffee      Marital status?  Married                                  What year were you married? March 16, 2009      Do you live in a house, apartment, assisted living, condo, trailer, etc.? House      Is it one or more stories? 1 story      How many persons live in your home? 2      Do you have any pets in your home? (please list) yes-dog      Current or past profession: Administrator      Do you exercise?  no                                    Type & how often? n/a      Do you have a living will? no      Do you have a DNR form?     no                             If not, do you want to discuss one? Yes      Do you have signed POA/HPOA for forms? no       Family History  Problem Relation Age of Onset  . Diabetes Mother        died at age 47  . Hypertension Mother   . Asthma  Sister   . Diabetes Sister   . Hypertension Sister   . Arthritis Sister   . Diabetes Sister   . Hypertension Sister   . Asthma Sister   . Arthritis Sister   . Asthma Son        controlled    No Known Allergies  Outpatient Encounter Medications as of 12/07/2017  Medication Sig  . bictegravir-emtricitabine-tenofovir AF (BIKTARVY) 50-200-25 MG TABS tablet Take 1 tablet by mouth daily.  . enalapril-hydrochlorothiazide (VASERETIC) 10-25 MG tablet TAKE 1 TABLET BY MOUTH DAILY.  . fluticasone (FLONASE) 50 MCG/ACT nasal spray Place 1 spray into both nostrils daily.  . Fluticasone-Salmeterol (ADVAIR DISKUS) 100-50 MCG/DOSE AEPB Inhale 1 puff into the lungs 2 (two) times daily.  . Melatonin 1 MG TABS Take by mouth as needed.   . metoprolol tartrate (LOPRESSOR) 100 MG tablet TAKE 1 TABLET BY MOUTH EVERY DAY  . [DISCONTINUED] calcium-vitamin D (OSCAL WITH D) 500-200 MG-UNIT tablet Take 2 tablets by mouth at bedtime. (Patient not taking: Reported on 11/12/2017)  . [DISCONTINUED] carbamide peroxide (DEBROX) 6.5 % OTIC solution Place 5 drops into both ears 2 (two) times daily. (Patient not taking: Reported on 12/07/2017)  . [DISCONTINUED] Fluticasone Furoate 100 MCG/ACT AEPB Inhale into the lungs.  . [DISCONTINUED] fluticasone-salmeterol (ADVAIR HFA) 115-21 MCG/ACT inhaler Inhale 2 puffs into the lungs 2 (two) times daily.   No facility-administered encounter medications on file as of 12/07/2017.     Review of Systems:  Review of Systems  Eyes: Positive for itching.  Respiratory: Positive for shortness of breath and wheezing.   All other systems reviewed and are negative.   Health Maintenance  Topic Date Due  . PNA vac Low Risk Adult (2 of 2 - PCV13) 02/15/2018  . MAMMOGRAM  05/12/2019  . COLONOSCOPY  11/03/2023  . TETANUS/TDAP  09/22/2027  . INFLUENZA VACCINE  Completed  . DEXA SCAN  Completed  . Hepatitis C Screening  Completed    Physical Exam: Vitals:   12/07/17 1054  BP: 122/84    Pulse: (!) 58  Temp: 97.8 F (36.6 C)  TempSrc: Oral  SpO2: 95%  Weight: 184 lb (83.5 kg)  Height: 5\' 8"  (1.727 m)   Body mass index is 27.98 kg/m. Physical Exam  Constitutional: She is oriented to person, place, and time.  HENT:  Mouth/Throat: Oropharynx is clear and moist. No oropharyngeal exudate.  No sinus TTP b/l; MMM; no oral thrush  Eyes: Right eye exhibits discharge (clear). Left eye exhibits discharge (clear). No scleral icterus.  B/l conjunctival cobblestoning appearance and redness  Cardiovascular: Normal rate, regular rhythm, normal heart sounds and intact distal pulses. Exam reveals no gallop and no friction rub.  No murmur heard. No LE edema b/l. No calf TTP  Pulmonary/Chest: Effort normal and breath sounds normal. No respiratory distress. She has no wheezes. She has no rales. She exhibits no tenderness.  Lymphadenopathy:    She has no cervical adenopathy.  Neurological: She is alert and oriented to person, place, and time.  Skin: Skin is warm and dry. No rash noted.  Psychiatric: She has a normal mood and affect. Her behavior is normal. Judgment and thought content normal.    Labs reviewed: Basic Metabolic Panel: Recent Labs    01/13/17 0145 06/17/17 1419 08/16/17 0811 09/21/17 0917  NA 135 138  --  140  K 3.0* 3.5  --  3.9  CL 96* 99  --  101  CO2 31 32  --  32  GLUCOSE 145* 84  --  101*  BUN 10 11  --  18  CREATININE 1.10* 0.94  --  0.96  CALCIUM 8.6* 9.5  --  9.7  TSH  --   --  1.43  --    Liver Function Tests: Recent Labs    01/03/17 1730 08/16/17 0811 09/21/17 0917  AST 22 19 16   ALT 19 14 17   ALKPHOS 50  --   --   BILITOT 0.8 1.3* 0.6  PROT 7.5 6.9 7.0  ALBUMIN 4.1  --   --    No results for input(s): LIPASE, AMYLASE in the last 8760 hours. No results for input(s): AMMONIA in the last 8760 hours. CBC: Recent Labs    01/13/17 0145 02/15/17 1536 06/17/17 1419  WBC 16.4* 7.4 6.5  NEUTROABS 12.1* 3,552 2,678  HGB 14.6 13.4 13.8   HCT 43.3 40.9 39.7  MCV 93.7 92.7 89.0  PLT 249 277 249   Lipid Panel: Recent Labs    08/16/17 0811  CHOL 219*  HDL 45*  LDLCALC 147*  TRIG 145  CHOLHDL 4.9   No results found for: HGBA1C  Procedures since last visit: No results found.  Assessment/Plan .   ICD-10-CM   1. Moderate persistent asthma without complication Z61.09 montelukast (SINGULAIR) 10 MG tablet  2. Seasonal allergies J30.2 montelukast (SINGULAIR) 10 MG tablet    fluticasone (FLONASE) 50 MCG/ACT nasal spray  3. Allergic conjunctivitis of both eyes H10.13 azelastine (OPTIVAR) 0.05 % ophthalmic solution   START FLONASE 1 SPRAY EACH NOSTRIL DAILY   START SINGULAIR (MONTELUKAST) 10MG  AT BEDTIME  START OPTIVAR EYE DROPS 1 DROP EACH EYE 2 TIMES  DAILY X 5 DAYS AS NEEDED FOR ITCHY EYES  Continue other medications as ordered   Follow up as scheduled or sooner if need be  Elinore Shults S. Perlie Gold  [facility] and Adult Medicine [address] [place], [place] [address] [phone] Cell (Monday-Friday 8 AM - 5 PM) [phone] After 5 PM and follow prompts

## 2017-12-08 LAB — T-HELPER CELL (CD4) - (RCID CLINIC ONLY)
CD4 % Helper T Cell: 16 % — ABNORMAL LOW (ref 33–55)
CD4 T Cell Abs: 470 /uL (ref 400–2700)

## 2017-12-10 LAB — HIV-1 RNA QUANT-NO REFLEX-BLD
HIV 1 RNA Quant: <20 copies/mL — AB
HIV-1 RNA Quant, Log: <1.3 Log copies/mL — AB

## 2017-12-26 ENCOUNTER — Other Ambulatory Visit: Payer: Self-pay

## 2017-12-26 ENCOUNTER — Emergency Department (HOSPITAL_BASED_OUTPATIENT_CLINIC_OR_DEPARTMENT_OTHER)
Admission: EM | Admit: 2017-12-26 | Discharge: 2017-12-26 | Disposition: A | Discharge status: Home / Self Care | Payer: 59 | Attending: Emergency Medicine | Admitting: Emergency Medicine

## 2017-12-26 ENCOUNTER — Encounter (HOSPITAL_BASED_OUTPATIENT_CLINIC_OR_DEPARTMENT_OTHER): Payer: Self-pay | Admitting: Adult Health

## 2017-12-26 DIAGNOSIS — R0602 Shortness of breath: Secondary | ICD-10-CM | POA: Diagnosis present

## 2017-12-26 DIAGNOSIS — J45909 Unspecified asthma, uncomplicated: Secondary | ICD-10-CM | POA: Insufficient documentation

## 2017-12-26 DIAGNOSIS — J45901 Unspecified asthma with (acute) exacerbation: Secondary | ICD-10-CM | POA: Insufficient documentation

## 2017-12-26 DIAGNOSIS — Z87891 Personal history of nicotine dependence: Secondary | ICD-10-CM | POA: Insufficient documentation

## 2017-12-26 DIAGNOSIS — I10 Essential (primary) hypertension: Secondary | ICD-10-CM | POA: Diagnosis not present

## 2017-12-26 DIAGNOSIS — Z79899 Other long term (current) drug therapy: Secondary | ICD-10-CM | POA: Diagnosis not present

## 2017-12-26 DIAGNOSIS — B20 Human immunodeficiency virus [HIV] disease: Secondary | ICD-10-CM | POA: Insufficient documentation

## 2017-12-26 MED ORDER — IPRATROPIUM-ALBUTEROL 0.5-2.5 (3) MG/3ML IN SOLN
RESPIRATORY_TRACT | Status: AC
  Administered 2017-12-26: 3 mL via RESPIRATORY_TRACT
  Filled 2017-12-26: qty 3

## 2017-12-26 MED ORDER — IPRATROPIUM-ALBUTEROL 0.5-2.5 (3) MG/3ML IN SOLN
3.0000 mL | Freq: Once | RESPIRATORY_TRACT | Status: AC
  Administered 2017-12-26: 3 mL via RESPIRATORY_TRACT

## 2017-12-26 MED ORDER — ALBUTEROL SULFATE (2.5 MG/3ML) 0.083% IN NEBU
2.5000 mg | INHALATION_SOLUTION | Freq: Once | RESPIRATORY_TRACT | Status: AC
  Administered 2017-12-26: 2.5 mg via RESPIRATORY_TRACT

## 2017-12-26 MED ORDER — ALBUTEROL (5 MG/ML) CONTINUOUS INHALATION SOLN
15.0000 mg/h | INHALATION_SOLUTION | Freq: Once | RESPIRATORY_TRACT | Status: AC
  Administered 2017-12-26: 15 mg/h via RESPIRATORY_TRACT
  Filled 2017-12-26: qty 20

## 2017-12-26 MED ORDER — PREDNISONE 50 MG PO TABS
60.0000 mg | ORAL_TABLET | Freq: Once | ORAL | Status: AC
  Administered 2017-12-26: 60 mg via ORAL
  Filled 2017-12-26: qty 1

## 2017-12-26 MED ORDER — ALBUTEROL SULFATE HFA 108 (90 BASE) MCG/ACT IN AERS: 2.0000 | INHALATION_SPRAY | RESPIRATORY_TRACT | 0 refills | Status: DC | PRN

## 2017-12-26 MED ORDER — ALBUTEROL SULFATE (2.5 MG/3ML) 0.083% IN NEBU
INHALATION_SOLUTION | RESPIRATORY_TRACT | Status: AC
  Administered 2017-12-26: 2.5 mg via RESPIRATORY_TRACT
  Filled 2017-12-26: qty 3

## 2017-12-26 MED ORDER — BUDESONIDE-FORMOTEROL FUMARATE 160-4.5 MCG/ACT IN AERO: 2.0000 | INHALATION_SPRAY | Freq: Every morning | RESPIRATORY_TRACT | 0 refills | Status: DC

## 2017-12-26 MED ORDER — PREDNISONE 20 MG PO TABS: ORAL_TABLET | ORAL | 0 refills | Status: DC

## 2017-12-26 MED ORDER — ALBUTEROL SULFATE (2.5 MG/3ML) 0.083% IN NEBU: 2.5000 mg | INHALATION_SOLUTION | RESPIRATORY_TRACT | 0 refills | Status: DC | PRN

## 2017-12-26 NOTE — ED Provider Notes (Addendum)
[place] EMERGENCY DEPARTMENT Provider Note   CSN: [id number] Arrival date & time: 12/26/17  1242     History   Chief Complaint Chief Complaint  Patient presents with  . Shortness of Breath    HPI Maria Adkins is a 67 y.o. female.  Complains of wheezing typical of asthma onset 2 days ago.  Treated with albuterol inhaler with transient relief.  No other associated symptoms.  Denies cough denies fever.  Nothing makes symptoms better or worse.  HPI  Past Medical History:  Diagnosis Date  . Asthma   . High blood pressure   . HIV (human immunodeficiency virus infection) ([place])   . Osteoarthritis    Viral load undetected, CD4 count 470 as of 12/07/17 Patient Active Problem List   Diagnosis Date Noted  . Asthma 02/05/2017  . Allergy 12/16/2016  . 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 Essential hypertension 08/19/2016  . Human immunodeficiency virus (HIV) disease ([place]) 08/19/2016    Past Surgical History:  Procedure Laterality Date  . Biopsy of Liver    . CATARACT EXTRACTION, BILATERAL    . CHOLECYSTECTOMY    . CYST REMOVAL HAND Left   . LAPAROSCOPIC SALPINGOOPHERECTOMY Right      OB History   None      Home Medications    Prior to Admission medications   Medication Sig Start Date End Date Taking? Authorizing Provider  azelastine (OPTIVAR) 0.05 % ophthalmic solution Place 1 drop into both eyes 2 (two) times daily. For 5 days 12/07/17   [name], DO  bictegravir-emtricitabine-tenofovir AF (BIKTARVY) 50-200-25 MG TABS tablet Take 1 tablet by mouth daily. 09/21/17   Carlyle Basques, MD  enalapril-hydrochlorothiazide (VASERETIC) 10-25 MG tablet TAKE 1 TABLET BY MOUTH DAILY. 07/14/17   [name], DO  fluticasone (FLONASE) 50 MCG/ACT nasal spray Place 1 spray into both nostrils daily. 12/07/17   [name], DO  Fluticasone-Salmeterol (ADVAIR DISKUS) 100-50 MCG/DOSE AEPB Inhale 1 puff into the lungs 2 (two) times daily.    [provider]   Melatonin 1 MG TABS Take by mouth as needed.     [provider]  metoprolol tartrate (LOPRESSOR) 100 MG tablet TAKE 1 TABLET BY MOUTH EVERY DAY 12/07/17   [name], DO  montelukast (SINGULAIR) 10 MG tablet Take 1 tablet (10 mg total) by mouth at bedtime. 12/07/17   [name], DO    Family History Family History  Problem Relation Age of Onset  . Diabetes Mother        died at age 14  . Hypertension Mother   . Asthma Sister   . Diabetes Sister   . Hypertension Sister   . Arthritis Sister   . Diabetes Sister   . Hypertension Sister   . Asthma Sister   . Arthritis Sister   . Asthma Son        controlled    Social History Social History   Tobacco Use  . Smoking status: Former Smoker    Packs/day: 1.00    Years: 40.00    Pack years: 40.00    Types: Cigarettes  . Smokeless tobacco: Never Used  Substance Use Topics  . Alcohol use: No  . Drug use: No     Allergies   Patient has no known allergies.   Review of Systems Review of Systems  Constitutional: Negative.   HENT: Negative.   Respiratory: Positive for shortness of breath and wheezing.   Cardiovascular: Negative.   Gastrointestinal: Negative.   Musculoskeletal: Negative.  Skin: Negative.   Neurological: Negative.   Psychiatric/Behavioral: Negative.   All other systems reviewed and are negative.    Physical Exam Updated Vital Signs BP 129/77   Pulse 71   Temp 97.9 F (36.6 C) (Oral)   Resp (!) 24   Ht 5\' 8"  (1.727 m)   Wt 83.5 kg (184 lb)   SpO2 96%   BMI 27.98 kg/m  I examined patient after she received 1 DuoNeb treatment with partial relief. Physical Exam  Constitutional: She appears well-developed and well-nourished. She appears distressed.  Mild respiratory distress, speaks in sentences  HENT:  Head: Normocephalic and atraumatic.  Eyes: Pupils are equal, round, and reactive to light. Conjunctivae are normal.  Neck: Neck supple. No tracheal deviation present. No  thyromegaly present.  Cardiovascular: Normal rate and regular rhythm.  No murmur heard. Pulmonary/Chest: Effort normal.  Prolonged expiratory phase with expiratory wheezes.  Abdominal: Soft. Bowel sounds are normal. She exhibits no distension. There is no tenderness.  Musculoskeletal: Normal range of motion. She exhibits no edema or tenderness.  Neurological: She is alert. Coordination normal.  Skin: Skin is warm and dry. No rash noted.  Psychiatric: She has a normal mood and affect.  Nursing note and vitals reviewed.    ED Treatments / Results  Labs (all labs ordered are listed, but only abnormal results are displayed) Labs Reviewed - No data to display  EKG None  EKG Interpretation  Date/Time:  Sunday December 26 2017 12:51:10 EDT Ventricular Rate:  71 PR Interval:    QRS Duration: 90 QT Interval:  404 QTC Calculation: 439 R Axis:   17 Text Interpretation:  Sinus rhythm Borderline prolonged PR interval Probable anteroseptal infarct, old No significant change since last tracing Confirmed by Orlie Dakin [phone]) on 12/26/2017 12:56:30 PM      Radiology No results found.  Procedures Procedures (including critical care time)  Medications Ordered in ED Medications  albuterol (PROVENTIL) (2.5 MG/3ML) 0.083% nebulizer solution (has no administration in time range)  ipratropium-albuterol (DUONEB) 0.5-2.5 (3) MG/3ML nebulizer solution (has no administration in time range)     Initial Impression / Assessment and Plan / ED Course  I have reviewed the triage vital signs and the nursing notes.  Pertinent labs & imaging results that were available during my care of the patient were reviewed by me and considered in my medical decision making (see chart for details).     3:10 PM after 1 hour of continuous nebulization and prednisone patient's breathing is much improved she feels ready to go home she is able to ambulate in the emergency department without dyspnea and with normal  pulse oximetry.  Plan prescription prednisone, Symbicort as she reports that Symbicort works better than Advair.  Prescription albuterol Nebules as well as albuterol HFA with spacer to use every 4 hours as needed return to the emergency department if needed more than every 4 hours or see PMD Dr. Eulas Post  Final Clinical Impressions(s) / ED Diagnoses  Diagnosis exacerbation of asthma Final diagnoses:  None    ED Discharge Orders    None       Orlie Dakin, MD 12/26/17 1519 CRITICAL CARE Performed by: Orlie Dakin Total critical care time: 30 minutes Critical care time was exclusive of separately billable procedures and treating other patients. Critical care was necessary to treat or prevent imminent or life-threatening deterioration. Critical care was time spent personally by me on the following activities: development of treatment plan with patient and/or surrogate as well as  nursing, discussions with consultants, evaluation of patient's response to treatment, examination of patient, obtaining history from patient or surrogate, ordering and performing treatments and interventions, ordering and review of laboratory studies, ordering and review of radiographic studies, pulse oximetry and re-evaluation of patient's condition.   Orlie Dakin, MD 01/03/18 [phone]

## 2017-12-26 NOTE — ED Notes (Signed)
Patient ambulated on r/a, HR 80-85, RR 20-24, SpO2 >95% for duration, no dyspnea noted.  VS per chart after.

## 2017-12-26 NOTE — Discharge Instructions (Signed)
Use your albuterol nebulizer every 4 hours or albuterol inhaler 2 puffs every 4 hours as needed for shortness of breath.  Return if needed more than every 4 hours or see Dr. Eulas Post

## 2017-12-26 NOTE — ED Triage Notes (Signed)
PResents with 2 days of worsening SOB, chest tightness, cough. Bilateral insp & exp. Wheezes noted. PT had pursed lip breathing and was only speaking in short phrases. Richardson Landry RT at [facility] administering duoneb.

## 2017-12-30 ENCOUNTER — Other Ambulatory Visit: Payer: Self-pay | Admitting: Internal Medicine

## 2018-01-11 ENCOUNTER — Encounter: Payer: Self-pay | Admitting: Internal Medicine

## 2018-01-11 ENCOUNTER — Other Ambulatory Visit: Payer: Self-pay | Admitting: Internal Medicine

## 2018-01-11 ENCOUNTER — Ambulatory Visit (INDEPENDENT_AMBULATORY_CARE_PROVIDER_SITE_OTHER): Payer: 59 | Admitting: Internal Medicine

## 2018-01-11 VITALS — BP 120/80 | HR 68 | Temp 98.2°F | Ht 68.0 in | Wt 185.6 lb

## 2018-01-11 DIAGNOSIS — J4540 Moderate persistent asthma, uncomplicated: Secondary | ICD-10-CM | POA: Diagnosis not present

## 2018-01-11 DIAGNOSIS — I10 Essential (primary) hypertension: Secondary | ICD-10-CM

## 2018-01-11 DIAGNOSIS — J302 Other seasonal allergic rhinitis: Secondary | ICD-10-CM

## 2018-01-11 DIAGNOSIS — J45998 Other asthma: Secondary | ICD-10-CM | POA: Diagnosis not present

## 2018-01-11 MED ORDER — ALBUTEROL SULFATE (2.5 MG/3ML) 0.083% IN NEBU: 2.5000 mg | INHALATION_SOLUTION | RESPIRATORY_TRACT | 6 refills | Status: DC | PRN

## 2018-01-11 NOTE — Progress Notes (Signed)
[name], female   DOB: 19-Jan-1951, 67 y.o.   MRN: [id number]   Location:  [facility] of Service:  OFFICE Provider: DR Arletha Grippe  Code Status: FULL CODE Goals of Care:  Advanced Directives 12/26/2017  Does Patient Have a Medical Advance Directive? Yes  Type of Paramedic of [place];Living will  Does patient want to make changes to medical advance directive? -  Copy of [place] in Chart? No - copy requested     Chief Complaint  Patient presents with  . Follow-up    F/U from ER visit;Pt wants to discuss a nebulizer   . Medication Refill    No refills needed   . Advance Care Planning    No ACP on file     HPI: Patient is a 67 y.o. female seen today for ED follow-up for asthma exacerbation where she rec'd aggressive care with nebulizer and IV steroids. She was started on symbicort BID. She reports improved SOB and no wheezing. Her sister is present. Prior to asthma exacerbation, she was using advair prn  Past Medical History:  Diagnosis Date  . Asthma   . High blood pressure   . HIV (human immunodeficiency virus infection) ([place])   . Osteoarthritis     Past Surgical History:  Procedure Laterality Date  . Biopsy of Liver    . CATARACT EXTRACTION, BILATERAL    . CHOLECYSTECTOMY    . CYST REMOVAL HAND Left   . LAPAROSCOPIC SALPINGOOPHERECTOMY Right     No Known Allergies  Outpatient Encounter Medications as of 01/11/2018  Medication Sig  . albuterol (PROVENTIL HFA;VENTOLIN HFA) 108 (90 Base) MCG/ACT inhaler Inhale 2 puffs into the lungs every 4 (four) hours as needed for wheezing or shortness of breath. Please give patient spacer  . albuterol (PROVENTIL) (2.5 MG/3ML) 0.083% nebulizer solution Take 3 mLs (2.5 mg total) by nebulization every 4 (four) hours as needed for wheezing or shortness of breath.  [name] azelastine (OPTIVAR) 0.05 % ophthalmic solution Place 1 drop into both eyes 2 (two) times daily. For 5 days    . bictegravir-emtricitabine-tenofovir AF (BIKTARVY) 50-200-25 MG TABS tablet Take 1 tablet by mouth daily.  . budesonide-formoterol (SYMBICORT) 160-4.5 MCG/ACT inhaler Inhale 2 puffs into the lungs every morning.  . enalapril-hydrochlorothiazide (VASERETIC) 10-25 MG tablet TAKE 1 TABLET BY MOUTH DAILY.  . fluticasone (FLONASE) 50 MCG/ACT nasal spray Place 1 spray into both nostrils daily.  . Melatonin 1 MG TABS Take by mouth as needed.   . metoprolol tartrate (LOPRESSOR) 100 MG tablet TAKE 1 TABLET BY MOUTH EVERY DAY  . montelukast (SINGULAIR) 10 MG tablet Take 1 tablet (10 mg total) by mouth at bedtime.  . [DISCONTINUED] Fluticasone-Salmeterol (ADVAIR DISKUS) 100-50 MCG/DOSE AEPB Inhale 1 puff into the lungs 2 (two) times daily.  . [DISCONTINUED] predniSONE (DELTASONE) 20 MG tablet 2 tabs po daily x 4 days.  Take first dose 12/27/2017 (Patient not taking: Reported on 01/11/2018)   No facility-administered encounter medications on file as of 01/11/2018.     Review of Systems:  Review of Systems  Constitutional: Negative for appetite change and fatigue.  Respiratory: Negative for shortness of breath and wheezing.   Cardiovascular: Negative for chest pain.  All other systems reviewed and are negative.   Health Maintenance  Topic Date Due  . PNA vac Low Risk Adult (2 of 2 - PCV13) 02/15/2018  . INFLUENZA VACCINE  04/14/2018  . MAMMOGRAM  05/12/2019  . COLONOSCOPY  11/03/2023  . TETANUS/TDAP  09/22/2027  . DEXA SCAN  Completed  . Hepatitis C Screening  Completed    Physical Exam: Vitals:   01/11/18 0850  BP: 120/80  Pulse: 68  Temp: 98.2 F (36.8 C)  TempSrc: Oral  SpO2: 96%  Weight: 185 lb 9.6 oz (84.2 kg)  Height: 5\' 8"  (1.727 m)   Body mass index is 28.22 kg/m. Physical Exam  Constitutional: She is oriented to person, place, and time. She appears well-developed and well-nourished.  HENT:  Mouth/Throat: Oropharynx is clear and moist. No oropharyngeal exudate.  Eyes:  Pupils are equal, round, and reactive to light. No scleral icterus.  Neck: Neck supple. Carotid bruit is not present. No tracheal deviation present. No thyromegaly present.  Cardiovascular: Normal rate, regular rhythm, normal heart sounds and intact distal pulses. Exam reveals no gallop and no friction rub.  No murmur heard. No LE edema b/l. no calf TTP.   Pulmonary/Chest: Effort normal and breath sounds normal. No stridor. No respiratory distress. She has no wheezes. She has no rales. She exhibits no tenderness.  Abdominal: Soft. Bowel sounds are normal. She exhibits no distension and no mass. There is no hepatomegaly. There is no tenderness. There is no rebound and no guarding.  Lymphadenopathy:    She has no cervical adenopathy.  Neurological: She is alert and oriented to person, place, and time. She has normal reflexes.  Skin: Skin is warm and dry. No rash noted.  Psychiatric: She has a normal mood and affect. Her behavior is normal. Judgment and thought content normal.    Labs reviewed: Basic Metabolic Panel: Recent Labs    01/13/17 0145 06/17/17 1419 08/16/17 0811 09/21/17 0917  NA 135 138  --  140  K 3.0* 3.5  --  3.9  CL 96* 99  --  101  CO2 31 32  --  32  GLUCOSE 145* 84  --  101*  BUN 10 11  --  18  CREATININE 1.10* 0.94  --  0.96  CALCIUM 8.6* 9.5  --  9.7  TSH  --   --  1.43  --    Liver Function Tests: Recent Labs    08/16/17 0811 09/21/17 0917  AST 19 16  ALT 14 17  BILITOT 1.3* 0.6  PROT 6.9 7.0   No results for input(s): LIPASE, AMYLASE in the last 8760 hours. No results for input(s): AMMONIA in the last 8760 hours. CBC: Recent Labs    01/13/17 0145 02/15/17 1536 06/17/17 1419  WBC 16.4* 7.4 6.5  NEUTROABS 12.1* 3,552 2,678  HGB 14.6 13.4 13.8  HCT 43.3 40.9 39.7  MCV 93.7 92.7 89.0  PLT 249 277 249   Lipid Panel: Recent Labs    08/16/17 0811  CHOL 219*  HDL 45*  LDLCALC 147*  TRIG 145  CHOLHDL 4.9   No results found for:  HGBA1C  Procedures since last visit: No results found.  Assessment/Plan   ICD-10-CM   1. Moderate persistent asthma without complication Q59.56 albuterol (PROVENTIL) (2.5 MG/3ML) 0.083% nebulizer solution  2. Seasonal allergies J30.2 albuterol (PROVENTIL) (2.5 MG/3ML) 0.083% nebulizer solution   Continue current medications as ordered - May take singulair   Nebulizer machine given today to use every 4 hrs as needed for wheezing, cough, shortness of breath  Follow up with specialists as scheduled  Follow up as scheduled or sooner if need be.    Jaquari Reckner S. [facility]  and [facility] [place], [place] [address] [phone] Cell (Monday-Friday 8 AM - 5 PM) [phone] After 5 PM and follow prompts

## 2018-01-11 NOTE — Patient Instructions (Addendum)
Continue current medications as ordered - May take singulair   Nebulizer machine given today to use every 4 hrs as needed for wheezing, cough, shortness of breath  Follow up with specialists as scheduled  Follow up as scheduled or sooner if need be.

## 2018-01-13 ENCOUNTER — Encounter: Payer: Self-pay | Admitting: Internal Medicine

## 2018-01-13 DIAGNOSIS — J302 Other seasonal allergic rhinitis: Secondary | ICD-10-CM | POA: Insufficient documentation

## 2018-01-23 ENCOUNTER — Other Ambulatory Visit: Payer: Self-pay | Admitting: Nurse Practitioner

## 2018-01-24 ENCOUNTER — Other Ambulatory Visit: Payer: Self-pay

## 2018-01-24 ENCOUNTER — Encounter: Payer: Self-pay | Admitting: Nurse Practitioner

## 2018-01-24 ENCOUNTER — Ambulatory Visit (INDEPENDENT_AMBULATORY_CARE_PROVIDER_SITE_OTHER): Payer: 59 | Admitting: Nurse Practitioner

## 2018-01-24 VITALS — BP 122/78 | HR 74 | Temp 98.7°F | Ht 68.0 in | Wt 184.6 lb

## 2018-01-24 DIAGNOSIS — R300 Dysuria: Secondary | ICD-10-CM

## 2018-01-24 DIAGNOSIS — K5900 Constipation, unspecified: Secondary | ICD-10-CM

## 2018-01-24 MED ORDER — MONTELUKAST SODIUM 10 MG PO TABS: 10.0000 mg | ORAL_TABLET | Freq: Every day | ORAL | 1 refills | Status: DC

## 2018-01-24 MED ORDER — SULFAMETHOXAZOLE-TRIMETHOPRIM 800-160 MG PO TABS: 1.0000 | ORAL_TABLET | Freq: Two times a day (BID) | ORAL | 0 refills | Status: DC

## 2018-01-24 NOTE — Patient Instructions (Signed)
Increase water intake Can use OTC pyridium 100 mg by mouth every 8 hours as needed to help with burning, frequency and urgency- will cause urine to be orange so be aware-- only take if needed  To start bactrim DS 1 tablet twice daily with food (to prevent upset stomach) for 1 week To use probiotic (OTC)  twice daily to help GI upset   to start colace 100 mg by mouth daily- stool softener Increase water intake and activity to help with bowels moving.    Constipation, Adult Constipation is when a person has fewer bowel movements in a week than normal, has difficulty having a bowel movement, or has stools that are dry, hard, or larger than normal. Constipation may be caused by an underlying condition. It may become worse with age if a person takes certain medicines and does not take in enough fluids. Follow these instructions at home: Eating and drinking   Eat foods that have a lot of fiber, such as fresh fruits and vegetables, whole grains, and beans.  Limit foods that are high in fat, low in fiber, or overly processed, such as french fries, hamburgers, cookies, candies, and soda.  Drink enough fluid to keep your urine clear or pale yellow. General instructions  Exercise regularly or as told by your health care provider.  Go to the restroom when you have the urge to go. Do not hold it in.  Take over-the-counter and prescription medicines only as told by your health care provider. These include any fiber supplements.  Practice pelvic floor retraining exercises, such as deep breathing while relaxing the lower abdomen and pelvic floor relaxation during bowel movements.  Watch your condition for any changes.  Keep all follow-up visits as told by your health care provider. This is important. Contact a health care provider if:  You have pain that gets worse.  You have a fever.  You do not have a bowel movement after 4 days.  You vomit.  You are not hungry.  You lose  weight.  You are bleeding from the anus.  You have thin, pencil-like stools. Get help right away if:  You have a fever and your symptoms suddenly get worse.  You leak stool or have blood in your stool.  Your abdomen is bloated.  You have severe pain in your abdomen.  You feel dizzy or you faint. This information is not intended to replace advice given to you by your health care provider. Make sure you discuss any questions you have with your health care provider. Document Released: 05/29/2004 Document Revised: 03/20/2016 Document Reviewed: 02/19/2016 Elsevier Interactive Patient Education  2018 [employer].

## 2018-01-24 NOTE — Progress Notes (Signed)
Careteam: Patient Care Team: [name], DO as PCP - General (Internal Medicine)  Advanced Directive information Does Patient Have a Medical Advance Directive?: No  No Known Allergies  Chief Complaint  Patient presents with  . Acute Visit    Pt is being seen due to frequent urination, pain and bleeding x 3 days.      HPI: Patient is a 67 y.o. female seen in the office today pain and pressure for the last several days but today increase pressure which started today. Also noted light color blood when she wiped. In the past she will have a light color blood on the tissue when she has a urine infection. Has not had a UTI in a while. Went to ED 1 year ago and was diagnosised with cystitis.  Sister who is here with her today and reports she is not drinking enough of water. She has tired to increase her intake.      Review of Systems:  Review of Systems  Constitutional: Negative for chills, fever and malaise/fatigue.  Gastrointestinal: Positive for constipation (once a week).  Genitourinary: Positive for dysuria, frequency, hematuria and urgency. Negative for flank pain.    Past Medical History:  Diagnosis Date  . Asthma   . High blood pressure   . HIV (human immunodeficiency virus infection) ([place])   . Osteoarthritis    Past Surgical History:  Procedure Laterality Date  . Biopsy of Liver    . CATARACT EXTRACTION, BILATERAL    . CHOLECYSTECTOMY    . CYST REMOVAL HAND Left   . LAPAROSCOPIC SALPINGOOPHERECTOMY Right    Social History:   reports that she has quit smoking. Her smoking use included cigarettes. She has a 40.00 pack-year smoking history. She has never used smokeless tobacco. She reports that she does not drink alcohol or use drugs.  Family History  Problem Relation Age of Onset  . Diabetes Mother        died at age 61  . Hypertension Mother   . Asthma Sister   . Diabetes Sister   . Hypertension Sister   . Arthritis Sister   . Diabetes Sister   .  Hypertension Sister   . Asthma Sister   . Arthritis Sister   . Asthma Son        controlled    Medications: Patient's Medications  New Prescriptions   No medications on file  Previous Medications   ALBUTEROL (PROVENTIL) (2.5 MG/3ML) 0.083% NEBULIZER SOLUTION    Take 3 mLs (2.5 mg total) by nebulization every 4 (four) hours as needed for wheezing or shortness of breath.   AZELASTINE (OPTIVAR) 0.05 % OPHTHALMIC SOLUTION    Place 1 drop into both eyes 2 (two) times daily. For 5 days   BICTEGRAVIR-EMTRICITABINE-TENOFOVIR AF (BIKTARVY) 50-200-25 MG TABS TABLET    Take 1 tablet by mouth daily.   BUDESONIDE-FORMOTEROL (SYMBICORT) 160-4.5 MCG/ACT INHALER    Inhale 2 puffs into the lungs every morning.   ENALAPRIL-HYDROCHLOROTHIAZIDE (VASERETIC) 10-25 MG TABLET    TAKE 1 TABLET BY MOUTH EVERY DAY   FLUTICASONE (FLONASE) 50 MCG/ACT NASAL SPRAY    Place 1 spray into both nostrils daily.   MELATONIN 1 MG TABS    Take by mouth as needed.    METOPROLOL TARTRATE (LOPRESSOR) 100 MG TABLET    TAKE 1 TABLET BY MOUTH EVERY DAY   MONTELUKAST (SINGULAIR) 10 MG TABLET    Take 1 tablet (10 mg total) by mouth at bedtime.   VENTOLIN HFA  108 (90 BASE) MCG/ACT INHALER    INHALE 2 PUFFS INTO THE LUNGS EVERY 4 HOURS AS NEEDED  Modified Medications   No medications on file  Discontinued Medications   No medications on file     Physical Exam:  Vitals:   01/24/18 1149  BP: 122/78  Pulse: 74  Temp: 98.7 F (37.1 C)  TempSrc: Oral  SpO2: 96%  Weight: 184 lb 9.6 oz (83.7 kg)  Height: 5\' 8"  (1.727 m)   Body mass index is 28.07 kg/m.  Physical Exam  Constitutional: She is oriented to person, place, and time. She appears well-developed and well-nourished.  Cardiovascular: Normal rate, regular rhythm and normal heart sounds.  Pulmonary/Chest: Effort normal and breath sounds normal.  Abdominal: Soft. Bowel sounds are normal. She exhibits no distension. There is no tenderness. There is no guarding.    Neurological: She is alert and oriented to person, place, and time.  Skin: Skin is warm and dry.    Labs reviewed: Basic Metabolic Panel: Recent Labs    06/17/17 1419 08/16/17 0811 09/21/17 0917  NA 138  --  140  K 3.5  --  3.9  CL 99  --  101  CO2 32  --  32  GLUCOSE 84  --  101*  BUN 11  --  18  CREATININE 0.94  --  0.96  CALCIUM 9.5  --  9.7  TSH  --  1.43  --    Liver Function Tests: Recent Labs    08/16/17 0811 09/21/17 0917  AST 19 16  ALT 14 17  BILITOT 1.3* 0.6  PROT 6.9 7.0   No results for input(s): LIPASE, AMYLASE in the last 8760 hours. No results for input(s): AMMONIA in the last 8760 hours. CBC: Recent Labs    02/15/17 1536 06/17/17 1419  WBC 7.4 6.5  NEUTROABS 3,552 2,678  HGB 13.4 13.8  HCT 40.9 39.7  MCV 92.7 89.0  PLT 277 249   Lipid Panel: Recent Labs    08/16/17 0811  CHOL 219*  HDL 45*  LDLCALC 147*  TRIG 145  CHOLHDL 4.9   TSH: Recent Labs    08/16/17 0811  TSH 1.43   A1C: No results found for: HGBA1C   Assessment/Plan 1. Dysuria -abnormal UA with blood and leukocytes will send for culture at this time. -to start sulfamethoxazole-trimethoprim (BACTRIM DS) 800-160 MG tablet; Take 1 tablet by mouth 2 (two) times daily.  Dispense: 14 tablet; Refill: 0 - Culture, Urine -increase hydration with water  2. Constipation, unspecified constipation type -increase fiber and fluids in diet -increase activity -colace 100 mg by mouth daily    Next appt: 05/17/2018 Janett Billow K. [place], [facility] [phone]

## 2018-01-25 LAB — URINE CULTURE
MICRO NUMBER:: 90579576
Result:: NO GROWTH
SPECIMEN QUALITY:: ADEQUATE

## 2018-02-10 DIAGNOSIS — J45998 Other asthma: Secondary | ICD-10-CM | POA: Diagnosis not present

## 2018-02-14 ENCOUNTER — Encounter: Payer: Self-pay | Admitting: Internal Medicine

## 2018-02-23 ENCOUNTER — Other Ambulatory Visit: Payer: Self-pay | Admitting: *Deleted

## 2018-02-23 MED ORDER — BUDESONIDE-FORMOTEROL FUMARATE 160-4.5 MCG/ACT IN AERO: 2.0000 | INHALATION_SPRAY | Freq: Every morning | RESPIRATORY_TRACT | 0 refills | Status: DC

## 2018-02-23 NOTE — Telephone Encounter (Signed)
[facility]  

## 2018-02-28 ENCOUNTER — Other Ambulatory Visit: Payer: Self-pay | Admitting: Internal Medicine

## 2018-02-28 DIAGNOSIS — J302 Other seasonal allergic rhinitis: Secondary | ICD-10-CM

## 2018-03-13 DIAGNOSIS — J45998 Other asthma: Secondary | ICD-10-CM | POA: Diagnosis not present

## 2018-03-19 ENCOUNTER — Other Ambulatory Visit: Payer: Self-pay | Admitting: Internal Medicine

## 2018-04-10 ENCOUNTER — Other Ambulatory Visit: Payer: Self-pay | Admitting: Internal Medicine

## 2018-04-14 ENCOUNTER — Other Ambulatory Visit: Payer: Self-pay | Admitting: *Deleted

## 2018-04-14 MED ORDER — BUDESONIDE-FORMOTEROL FUMARATE 160-4.5 MCG/ACT IN AERO: INHALATION_SPRAY | RESPIRATORY_TRACT | 1 refills | Status: DC

## 2018-04-14 NOTE — Telephone Encounter (Signed)
[facility]  

## 2018-05-04 ENCOUNTER — Encounter: Payer: Self-pay | Admitting: Internal Medicine

## 2018-05-17 ENCOUNTER — Ambulatory Visit: Payer: Medicare Other | Admitting: Internal Medicine

## 2018-05-19 ENCOUNTER — Encounter: Payer: Self-pay | Admitting: Internal Medicine

## 2018-05-20 ENCOUNTER — Encounter: Payer: Self-pay | Admitting: Internal Medicine

## 2018-05-20 ENCOUNTER — Ambulatory Visit (INDEPENDENT_AMBULATORY_CARE_PROVIDER_SITE_OTHER): Payer: 59 | Admitting: Internal Medicine

## 2018-05-20 VITALS — BP 118/78 | HR 61 | Temp 97.6°F | Resp 18 | Ht 68.0 in | Wt 187.2 lb

## 2018-05-20 DIAGNOSIS — I10 Essential (primary) hypertension: Secondary | ICD-10-CM | POA: Diagnosis not present

## 2018-05-20 DIAGNOSIS — J4540 Moderate persistent asthma, uncomplicated: Secondary | ICD-10-CM | POA: Diagnosis not present

## 2018-05-20 DIAGNOSIS — B20 Human immunodeficiency virus [HIV] disease: Secondary | ICD-10-CM | POA: Diagnosis not present

## 2018-05-20 DIAGNOSIS — Z23 Encounter for immunization: Secondary | ICD-10-CM | POA: Diagnosis not present

## 2018-05-20 DIAGNOSIS — M654 Radial styloid tenosynovitis [de Quervain]: Secondary | ICD-10-CM | POA: Diagnosis not present

## 2018-05-20 DIAGNOSIS — J302 Other seasonal allergic rhinitis: Secondary | ICD-10-CM

## 2018-05-20 MED ORDER — PNEUMOCOCCAL 13-VAL CONJ VACC IM SUSP: 0.5000 mL | INTRAMUSCULAR | Status: DC

## 2018-05-20 MED ORDER — PNEUMOCOCCAL 13-VAL CONJ VACC IM SUSP
0.5000 mL | Freq: Once | INTRAMUSCULAR | Status: AC
Start: 2018-05-20 — End: 2018-05-20
  Administered 2018-05-20: 0.5 mL via INTRAMUSCULAR

## 2018-05-20 NOTE — Patient Instructions (Addendum)
Continue current medications as ordered  Follow up with specialists as scheduled - will need flu shot at visit with Dr Janee Morn vaccine given today  Follow up in 6 mos with Janett Billow for HTN and asthma

## 2018-05-20 NOTE — Addendum Note (Signed)
Addended by: Eilene Ghazi on: 05/20/2018 04:52 PM   Modules accepted: Orders

## 2018-05-20 NOTE — Progress Notes (Signed)
[name], female   DOB: 1951-01-10, 67 y.o.   MRN: [id number]   Location:  [place] OFFICE  Provider: DR Arletha Grippe  Code Status: full Goals of Care:  Advanced Directives 01/24/2018  Does Patient Have a Medical Advance Directive? No  Type of Advance Directive -  Does patient want to make changes to medical advance directive? -  Copy of [place] in Chart? -     Chief Complaint  Patient presents with  . Medical Management of Chronic Issues    6 mo f/u-HTN, asthma    HPI: Patient is a 67 y.o. female seen today for medical management of chronic diseases.  She c/o right wrist intermittent tingling sensation with pain in lateral area. No known injury. She drives a lot to [place] and [place].  Hx moderate persistent asthma - well controlled on advair HFA. Rarely uses prn HFA. She has taken flonase and singulair. Followed by pulmonary Maria Adkins  HIV - stable. HIV1 RNA quant <20 with log of <1.30 in Jan 2019. CD4 count 560. Currently takes biktarvy (bictegravir-emtricitabine-tenofivir) daily. Followed by ID Maria Adkins  HTN - controlled on metoprolol succ and enalapril hct  Osteoporosis - stable on fosamax. Last DXA 2 yrs ago  Remote tobacco abuse hx    Past Medical History:  Diagnosis Date  . Asthma   . High blood pressure   . HIV (human immunodeficiency virus infection) ([place])   . Osteoarthritis     Past Surgical History:  Procedure Laterality Date  . Biopsy of Liver    . CATARACT EXTRACTION, BILATERAL    . CHOLECYSTECTOMY    . CYST REMOVAL HAND Left   . LAPAROSCOPIC SALPINGOOPHERECTOMY Right      reports that she has quit smoking. Her smoking use included cigarettes. She has a 40.00 pack-year smoking history. She has never used smokeless tobacco. She reports that she does not drink alcohol or use drugs. Social History   Socioeconomic History  . Marital status: Married    Spouse name: Not on file  . Number of children: Not on file  . Years of  education: Not on file  . Highest education level: Not on file  Occupational History  . Not on file  Social Needs  . Financial resource strain: Not on file  . Food insecurity:    Worry: Not on file    Inability: Not on file  . Transportation needs:    Medical: Not on file    Non-medical: Not on file  Tobacco Use  . Smoking status: Former Smoker    Packs/day: 1.00    Years: 40.00    Pack years: 40.00    Types: Cigarettes  . Smokeless tobacco: Never Used  Substance and Sexual Activity  . Alcohol use: No  . Drug use: No  . Sexual activity: Yes    Partners: Male    Birth control/protection: Condom    Comment: declined condoms  Lifestyle  . Physical activity:    Days per week: Not on file    Minutes per session: Not on file  . Stress: Not on file  Relationships  . Social connections:    Talks on phone: Not on file    Gets together: Not on file    Attends religious service: Not on file    Active member of club or organization: Not on file    Attends meetings of clubs or organizations: Not on file    Relationship status: Not on  file  . Intimate partner violence:    Fear of current or ex partner: Not on file    Emotionally abused: Not on file    Physically abused: Not on file    Forced sexual activity: Not on file  Other Topics Concern  . Not on file  Social History Narrative   Diet? Regular      Do you drink/eat things with caffeine? Coffee      Marital status?  Married                                  What year were you married? March 16, 2009      Do you live in a house, apartment, assisted living, condo, trailer, etc.? House      Is it one or more stories? 1 story      How many persons live in your home? 2      Do you have any pets in your home? (please list) yes-dog      Current or past profession: Administrator      Do you exercise?  no                                    Type & how often? n/a      Do you have a living will? no      Do you have a DNR  form?     no                             If not, do you want to discuss one? Yes      Do you have signed POA/HPOA for forms? no       Family History  Problem Relation Age of Onset  . Diabetes Mother        died at age 92  . Hypertension Mother   . Asthma Sister   . Diabetes Sister   . Hypertension Sister   . Arthritis Sister   . Diabetes Sister   . Hypertension Sister   . Asthma Sister   . Arthritis Sister   . Asthma Son        controlled    No Known Allergies  Outpatient Encounter Medications as of 05/20/2018  Medication Sig  . albuterol (PROVENTIL) (2.5 MG/3ML) 0.083% nebulizer solution Take 3 mLs (2.5 mg total) by nebulization every 4 (four) hours as needed for wheezing or shortness of breath.  . bictegravir-emtricitabine-tenofovir AF (BIKTARVY) 50-200-25 MG TABS tablet Take 1 tablet by mouth daily.  . budesonide-formoterol (SYMBICORT) 160-4.5 MCG/ACT inhaler Inhale two puffs into the lungs every morning  . enalapril-hydrochlorothiazide (VASERETIC) 10-25 MG tablet TAKE 1 TABLET BY MOUTH EVERY DAY  . Melatonin 1 MG TABS Take by mouth as needed.   . metoprolol tartrate (LOPRESSOR) 100 MG tablet TAKE 1 TABLET BY MOUTH EVERY DAY  . montelukast (SINGULAIR) 10 MG tablet Take 1 tablet (10 mg total) by mouth at bedtime.  . sulfamethoxazole-trimethoprim (BACTRIM DS) 800-160 MG tablet Take 1 tablet by mouth 2 (two) times daily.  . [DISCONTINUED] azelastine (OPTIVAR) 0.05 % ophthalmic solution Place 1 drop into both eyes 2 (two) times daily. For 5 days  . [DISCONTINUED] fluticasone (FLONASE) 50 MCG/ACT nasal spray PLACE 1 SPRAY INTO BOTH NOSTRILS DAILY.  . [DISCONTINUED] VENTOLIN HFA  108 (90 Base) MCG/ACT inhaler INHALE 2 PUFFS INTO THE LUNGS EVERY 4 HOURS AS NEEDED   No facility-administered encounter medications on file as of 05/20/2018.     Review of Systems:  Review of Systems  Musculoskeletal: Positive for joint swelling (right wrist with tingling sensation intermittently).    All other systems reviewed and are negative.   Health Maintenance  Topic Date Due  . PNA vac Low Risk Adult (2 of 2 - PCV13) 02/15/2018  . INFLUENZA VACCINE  04/14/2018  . MAMMOGRAM  05/12/2019  . COLONOSCOPY  11/03/2023  . TETANUS/TDAP  09/22/2027  . DEXA SCAN  Completed  . Hepatitis C Screening  Completed    Physical Exam: Vitals:   05/20/18 1150  BP: 118/78  Pulse: 61  Resp: 18  Temp: 97.6 F (36.4 C)  SpO2: 97%  Weight: 187 lb 3.2 oz (84.9 kg)  Height: 5\' 8"  (1.727 m)   Body mass index is 28.46 kg/m. Physical Exam  Constitutional: She is oriented to person, place, and time. She appears well-developed and well-nourished.  HENT:  Mouth/Throat: Oropharynx is clear and moist. No oropharyngeal exudate.  MMM; no oral thrush  Eyes: Pupils are equal, round, and reactive to light. No scleral icterus.  Neck: Neck supple. Carotid bruit is not present. No tracheal deviation present. No thyromegaly present.  Cardiovascular: Normal rate, regular rhythm, normal heart sounds and intact distal pulses. Exam reveals no gallop and no friction rub.  No murmur heard. No LE edema b/l. no calf TTP.   Pulmonary/Chest: Effort normal and breath sounds normal. No stridor. No respiratory distress. She has no wheezes. She has no rales.  Abdominal: Soft. Normal appearance and bowel sounds are normal. She exhibits no distension and no mass. There is no hepatomegaly. There is no tenderness. There is no rigidity, no rebound and no guarding. No hernia.  Musculoskeletal: She exhibits edema (right lateral wrist) and tenderness (right radial styloid).  (+) right Finklestein test  Lymphadenopathy:    She has no cervical adenopathy.  Neurological: She is alert and oriented to person, place, and time. She has normal reflexes.  Skin: Skin is warm and dry. No rash noted.  Psychiatric: She has a normal mood and affect. Her behavior is normal. Judgment and thought content normal.    Labs reviewed: Basic  Metabolic Panel: Recent Labs    06/17/17 1419 08/16/17 0811 09/21/17 0917  NA 138  --  140  K 3.5  --  3.9  CL 99  --  101  CO2 32  --  32  GLUCOSE 84  --  101*  BUN 11  --  18  CREATININE 0.94  --  0.96  CALCIUM 9.5  --  9.7  TSH  --  1.43  --    Liver Function Tests: Recent Labs    08/16/17 0811 09/21/17 0917  AST 19 16  ALT 14 17  BILITOT 1.3* 0.6  PROT 6.9 7.0   No results for input(s): LIPASE, AMYLASE in the last 8760 hours. No results for input(s): AMMONIA in the last 8760 hours. CBC: Recent Labs    06/17/17 1419  WBC 6.5  NEUTROABS 2,678  HGB 13.8  HCT 39.7  MCV 89.0  PLT 249   Lipid Panel: Recent Labs    08/16/17 0811  CHOL 219*  HDL 45*  LDLCALC 147*  TRIG 145  CHOLHDL 4.9   No results found for: HGBA1C  Procedures since last visit: No results found.  Assessment/Plan   ICD-10-CM  1. Moderate persistent asthma without complication H36.43   2. Essential hypertension I10   3. Seasonal allergies J30.2   4. Human immunodeficiency virus (HIV) disease ([place]) B20   5. De Quervain's tenosynovitis, right M65.4 Ambulatory referral to Orthopedic Surgery     Refer to Ortho for right wrist tenosynovitis  Continue current medications as ordered  Follow up with specialists as scheduled - will need flu shot at visit with Maria Janee Morn vaccine given today  Follow up in 6 mos with Maria Adkins for HTN and asthma   Maria Adkins  [facility] [address] [place], [place] [address] [phone] Cell (Monday-Friday 8 AM - 5 PM) [phone] After 5 PM and follow prompts

## 2018-06-05 ENCOUNTER — Emergency Department (HOSPITAL_BASED_OUTPATIENT_CLINIC_OR_DEPARTMENT_OTHER)
Admission: EM | Admit: 2018-06-05 | Discharge: 2018-06-05 | Disposition: A | Discharge status: Home / Self Care | Payer: 59 | Attending: Emergency Medicine | Admitting: Emergency Medicine

## 2018-06-05 ENCOUNTER — Encounter (HOSPITAL_BASED_OUTPATIENT_CLINIC_OR_DEPARTMENT_OTHER): Payer: Self-pay | Admitting: Emergency Medicine

## 2018-06-05 ENCOUNTER — Other Ambulatory Visit: Payer: Self-pay

## 2018-06-05 ENCOUNTER — Emergency Department (HOSPITAL_BASED_OUTPATIENT_CLINIC_OR_DEPARTMENT_OTHER): Payer: 59

## 2018-06-05 DIAGNOSIS — Z79899 Other long term (current) drug therapy: Secondary | ICD-10-CM | POA: Insufficient documentation

## 2018-06-05 DIAGNOSIS — R05 Cough: Secondary | ICD-10-CM | POA: Diagnosis not present

## 2018-06-05 DIAGNOSIS — Z87891 Personal history of nicotine dependence: Secondary | ICD-10-CM | POA: Insufficient documentation

## 2018-06-05 DIAGNOSIS — Z21 Asymptomatic human immunodeficiency virus [HIV] infection status: Secondary | ICD-10-CM | POA: Insufficient documentation

## 2018-06-05 DIAGNOSIS — J4541 Moderate persistent asthma with (acute) exacerbation: Secondary | ICD-10-CM | POA: Insufficient documentation

## 2018-06-05 DIAGNOSIS — R0602 Shortness of breath: Secondary | ICD-10-CM | POA: Diagnosis not present

## 2018-06-05 MED ORDER — MAGNESIUM SULFATE 2 GM/50ML IV SOLN
2.0000 g | Freq: Once | INTRAVENOUS | Status: AC
  Administered 2018-06-05: 2 g via INTRAVENOUS
  Filled 2018-06-05: qty 50

## 2018-06-05 MED ORDER — IPRATROPIUM BROMIDE 0.02 % IN SOLN
0.5000 mg | Freq: Once | RESPIRATORY_TRACT | Status: AC
  Administered 2018-06-05: 0.5 mg via RESPIRATORY_TRACT
  Filled 2018-06-05: qty 2.5

## 2018-06-05 MED ORDER — ALBUTEROL SULFATE (2.5 MG/3ML) 0.083% IN NEBU
INHALATION_SOLUTION | RESPIRATORY_TRACT | Status: AC
  Administered 2018-06-05: 2.5 mg
  Filled 2018-06-05: qty 3

## 2018-06-05 MED ORDER — METHYLPREDNISOLONE SODIUM SUCC 125 MG IJ SOLR
125.0000 mg | Freq: Once | INTRAMUSCULAR | Status: AC
  Administered 2018-06-05: 125 mg via INTRAVENOUS
  Filled 2018-06-05: qty 2

## 2018-06-05 MED ORDER — IPRATROPIUM-ALBUTEROL 0.5-2.5 (3) MG/3ML IN SOLN
3.0000 mL | Freq: Once | RESPIRATORY_TRACT | Status: AC
  Administered 2018-06-05: 3 mL via RESPIRATORY_TRACT

## 2018-06-05 MED ORDER — PREDNISONE 20 MG PO TABS: 40.0000 mg | ORAL_TABLET | Freq: Every day | ORAL | 0 refills | Status: DC

## 2018-06-05 MED ORDER — ALBUTEROL (5 MG/ML) CONTINUOUS INHALATION SOLN
15.0000 mg/h | INHALATION_SOLUTION | Freq: Once | RESPIRATORY_TRACT | Status: AC
  Administered 2018-06-05: 15 mg/h via RESPIRATORY_TRACT
  Filled 2018-06-05: qty 20

## 2018-06-05 NOTE — ED Provider Notes (Addendum)
[facility] EMERGENCY DEPARTMENT Provider Note   CSN: [id number] Arrival date & time: 06/05/18  7425     History   Chief Complaint Chief Complaint  Patient presents with  . Shortness of Breath    HPI Maria Adkins is a 67 y.o. female.  Pt is a 67y/o female with hx of moderate severity asthma on advair, HIV on biktarvy with last CD4 count of 470 and RNA quant of <20 and HTN who is presenting with SOB and wheezing with cough since yesterday.  She used her nebulizer last night q2-3 hr without relief and then again this morning.  She denies productive cough, fever or chest pain.  No recent travel or unilateral leg pain or swelling.  No hx of PE or DVT in herself or family.  She moved here from [place] 2 years ago and said since that time she has had a terrible time with her asthma.  The history is provided by the patient.  Shortness of Breath  This is a new problem. Duration: yesterday. The problem occurs continuously.The current episode started yesterday. The problem has been gradually worsening. Associated symptoms include cough. She has tried beta-agonist inhalers for the symptoms. The treatment provided no relief. Associated medical issues include asthma. Associated medical issues comments: HIV, HTN.    Past Medical History:  Diagnosis Date  . Asthma   . High blood pressure   . HIV (human immunodeficiency virus infection) ([place])   . Osteoarthritis     Patient Active Problem List   Diagnosis Date Noted  . Seasonal allergies 01/13/2018  . Asthma 02/05/2017  . Allergy 12/16/2016  . 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 Essential hypertension 08/19/2016  . Human immunodeficiency virus (HIV) disease ([place]) 08/19/2016    Past Surgical History:  Procedure Laterality Date  . Biopsy of Liver    . CATARACT EXTRACTION, BILATERAL    . CHOLECYSTECTOMY    . CYST REMOVAL HAND Left   . LAPAROSCOPIC SALPINGOOPHERECTOMY Right      OB History   None      Home  Medications    Prior to Admission medications   Medication Sig Start Date End Date Taking? Authorizing Provider  albuterol (PROVENTIL) (2.5 MG/3ML) 0.083% nebulizer solution Take 3 mLs (2.5 mg total) by nebulization every 4 (four) hours as needed for wheezing or shortness of breath. 01/11/18   [name], DO  bictegravir-emtricitabine-tenofovir AF (BIKTARVY) 50-200-25 MG TABS tablet Take 1 tablet by mouth daily. 09/21/17   Carlyle Basques, MD  budesonide-formoterol [facility]) 160-4.5 MCG/ACT inhaler Inhale two puffs into the lungs every morning 04/14/18   [name], DO  enalapril-hydrochlorothiazide (VASERETIC) 10-25 MG tablet TAKE 1 TABLET BY MOUTH EVERY DAY 01/11/18   [name], DO  Melatonin 1 MG TABS Take by mouth as needed.     [provider]  metoprolol tartrate (LOPRESSOR) 100 MG tablet TAKE 1 TABLET BY MOUTH EVERY DAY 04/11/18   [name], DO  montelukast (SINGULAIR) 10 MG tablet Take 1 tablet (10 mg total) by mouth at bedtime. 01/24/18   Lauree Chandler, NP  sulfamethoxazole-trimethoprim (BACTRIM DS) 800-160 MG tablet Take 1 tablet by mouth 2 (two) times daily. 01/24/18   Lauree Chandler, NP    Family History Family History  Problem Relation Age of Onset  . Diabetes Mother        died at age 88  . Hypertension Mother   . Asthma Sister   . Diabetes Sister   . Hypertension Sister   .  Arthritis Sister   . Diabetes Sister   . Hypertension Sister   . Asthma Sister   . Arthritis Sister   . Asthma Son        controlled    Social History Social History   Tobacco Use  . Smoking status: Former Smoker    Packs/day: 1.00    Years: 40.00    Pack years: 40.00    Types: Cigarettes  . Smokeless tobacco: Never Used  Substance Use Topics  . Alcohol use: No  . Drug use: No     Allergies   Patient has no known allergies.   Review of Systems Review of Systems  Respiratory: Positive for cough and shortness of breath.   All other systems reviewed  and are negative.    Physical Exam Updated Vital Signs BP 130/89 (BP Location: Right Arm)   Pulse 98   Temp 98.2 F (36.8 C) (Oral)   Resp (!) 30   Ht 5\' 8"  (1.727 m)   Wt 81.6 kg   SpO2 98%   BMI 27.37 kg/m   Physical Exam  Constitutional: She is oriented to person, place, and time. She appears well-developed and well-nourished. No distress.  HENT:  Head: Normocephalic and atraumatic.  Mouth/Throat: Oropharynx is clear and moist.  Eyes: Pupils are equal, round, and reactive to light. Conjunctivae and EOM are normal.  Neck: Normal range of motion. Neck supple.  Cardiovascular: Normal rate, regular rhythm and intact distal pulses.  No murmur heard. Pulmonary/Chest: Effort normal. Tachypnea noted. No respiratory distress. She has decreased breath sounds. She has wheezes in the right lower field and the left lower field. She has no rales.  Notable decreased breath sounds throughout  Abdominal: Soft. She exhibits no distension. There is no tenderness. There is no rebound and no guarding.  Musculoskeletal: Normal range of motion. She exhibits no edema or tenderness.  No calf pain or swelling  Neurological: She is alert and oriented to person, place, and time.  Skin: Skin is warm and dry. No rash noted. No erythema.  Psychiatric: She has a normal mood and affect. Her behavior is normal.  Nursing note and vitals reviewed.    ED Treatments / Results  Labs (all labs ordered are listed, but only abnormal results are displayed) Labs Reviewed - No data to display  EKG None  Radiology Dg Chest 2 View  Result Date: 06/05/2018 CLINICAL DATA:  Short of breath for 1 day. Cough. History of asthma. EXAM: CHEST - 2 VIEW COMPARISON:  01/03/2017 FINDINGS: Cardiac silhouette is normal in size. No mediastinal or hilar masses. No evidence of adenopathy. Lungs are hyperexpanded. Prominent bronchovascular markings, stable from exam. No evidence of pneumonia or pulmonary edema. No pleural  effusion or pneumothorax. Skeletal structures are intact. IMPRESSION: No active cardiopulmonary disease. Electronically Signed   By: Lajean Manes M.D.   On: 06/05/2018 09:16    Procedures Procedures (including critical care time)  Medications Ordered in ED Medications  albuterol (PROVENTIL) (2.5 MG/3ML) 0.083% nebulizer solution (has no administration in time range)  methylPREDNISolone sodium succinate (SOLU-MEDROL) 125 mg/2 mL injection 125 mg (has no administration in time range)  magnesium sulfate IVPB 2 g 50 mL (has no administration in time range)  ipratropium-albuterol (DUONEB) 0.5-2.5 (3) MG/3ML nebulizer solution 3 mL (3 mLs Nebulization Given 06/05/18 0846)     Initial Impression / Assessment and Plan / ED Course  I have reviewed the triage vital signs and the nursing notes.  Pertinent labs & imaging results  that were available during my care of the patient were reviewed by me and considered in my medical decision making (see chart for details).     Elderly female with hx of asthma here today with concern for asthma exacerbation.  Pt has no infectious sx concerning for PNA.  Also pt does have HIV but on therapy and CD4 counts 470 with lower suspicion for PCP PNA.  Pt denies chest pain and has no c/o concerning for PE, ACS and no signs of CHF. Pt given albuterol/atrovent with some improvement in air movement.  Sats are 95%.  Will start on CAT and pt given magnesium and solumedrol.  CXR pending.  10:49 AM Pt now having diffuse wheezing and moving more air with improvement in SOB.  Will continue to monitor.  12:09 PM Vision is currently feeling much better.  She is able to ambulate in the department with no desaturation.  She is requesting to be discharged home.  She has plenty of albuterol at home and will continue to use Advair.  Patient was given a prescription for prednisone and follow-up with her PCP.  CRITICAL CARE Performed by: Maeve Debord Total critical care time:  30 minutes Critical care time was exclusive of separately billable procedures and treating other patients. Critical care was necessary to treat or prevent imminent or life-threatening deterioration. Critical care was time spent personally by me on the following activities: development of treatment plan with patient and/or surrogate as well as nursing, discussions with consultants, evaluation of patient's response to treatment, examination of patient, obtaining history from patient or surrogate, ordering and performing treatments and interventions, ordering and review of laboratory studies, ordering and review of radiographic studies, pulse oximetry and re-evaluation of patient's condition.   Final Clinical Impressions(s) / ED Diagnoses   Final diagnoses:  Moderate persistent asthma with exacerbation    ED Discharge Orders         Ordered    predniSONE (DELTASONE) 20 MG tablet  Daily     06/05/18 1210           Blanchie Dessert, MD 06/05/18 1210    Blanchie Dessert, MD 06/05/18 1211

## 2018-06-05 NOTE — Progress Notes (Signed)
RT note: RT ambulated patient around the ED. The patients vital signs were stable HR 126-128 RR 18-20 sats 96-97%. No shortness of breath noted and patient stated she feels fine and is ready to go home.

## 2018-06-05 NOTE — ED Triage Notes (Signed)
SOB since last night, hx of asthma, using inhaler without relief.

## 2018-06-09 ENCOUNTER — Encounter: Payer: Self-pay | Admitting: Internal Medicine

## 2018-06-09 ENCOUNTER — Ambulatory Visit (INDEPENDENT_AMBULATORY_CARE_PROVIDER_SITE_OTHER): Payer: 59 | Admitting: Internal Medicine

## 2018-06-09 VITALS — BP 127/73 | HR 61 | Temp 97.9°F | Wt 180.4 lb

## 2018-06-09 DIAGNOSIS — B20 Human immunodeficiency virus [HIV] disease: Secondary | ICD-10-CM

## 2018-06-09 DIAGNOSIS — G588 Other specified mononeuropathies: Secondary | ICD-10-CM

## 2018-06-09 DIAGNOSIS — J4520 Mild intermittent asthma, uncomplicated: Secondary | ICD-10-CM

## 2018-06-09 DIAGNOSIS — Z23 Encounter for immunization: Secondary | ICD-10-CM

## 2018-06-09 MED ORDER — BICTEGRAVIR-EMTRICITAB-TENOFOV 50-200-25 MG PO TABS: 1.0000 | ORAL_TABLET | Freq: Every day | ORAL | 11 refills | Status: DC

## 2018-06-09 NOTE — Progress Notes (Signed)
[name], female   DOB: 04-26-1951, 67 y.o.   MRN: [id number]  HPI Zerline is a 67yo F with well controlled hiv disease, she has been well controlled on biktarvy. She states that she Had asthma attack roughly a week ago. Had to go to the ED - now improved on steroids, finishes last dose today  ROS_right hand intermittent neuropathy to dorsum of hand  Outpatient Encounter Medications as of 06/09/2018  Medication Sig  . albuterol (PROVENTIL) (2.5 MG/3ML) 0.083% nebulizer solution Take 3 mLs (2.5 mg total) by nebulization every 4 (four) hours as needed for wheezing or shortness of breath.  . bictegravir-emtricitabine-tenofovir AF (BIKTARVY) 50-200-25 MG TABS tablet Take 1 tablet by mouth daily.  . budesonide-formoterol (SYMBICORT) 160-4.5 MCG/ACT inhaler Inhale two puffs into the lungs every morning  . enalapril-hydrochlorothiazide (VASERETIC) 10-25 MG tablet TAKE 1 TABLET BY MOUTH EVERY DAY  . Melatonin 1 MG TABS Take by mouth as needed.   . metoprolol tartrate (LOPRESSOR) 100 MG tablet TAKE 1 TABLET BY MOUTH EVERY DAY  . montelukast (SINGULAIR) 10 MG tablet Take 1 tablet (10 mg total) by mouth at bedtime.  . predniSONE (DELTASONE) 20 MG tablet Take 2 tablets (40 mg total) by mouth daily.  [name] sulfamethoxazole-trimethoprim (BACTRIM DS) 800-160 MG tablet Take 1 tablet by mouth 2 (two) times daily. (Patient not taking: Reported on 06/09/2018)   No facility-administered encounter medications on file as of 06/09/2018.      Patient Active Problem List   Diagnosis Date Noted  . Seasonal allergies 01/13/2018  . Asthma 02/05/2017  . Allergy 12/16/2016  . 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 Essential hypertension 08/19/2016  . Human immunodeficiency virus (HIV) disease ([place]) 08/19/2016     Health Maintenance Due  Topic Date Due  . PAP SMEAR  [birth year]  . INFLUENZA VACCINE  04/14/2018     Review of Systems Per hpi otherwise 12 point ros is negative Physical Exam    BP 127/73   Pulse 61   Temp 97.9 F (36.6 C)   Wt 180 lb 6.4 oz (81.8 kg)   BMI 27.43 kg/m   Physical Exam  Constitutional:  oriented to person, place, and time. appears well-developed and well-nourished. No distress.  HENT: [place]/AT, PERRLA, no scleral icterus Mouth/Throat: Oropharynx is clear and moist. No oropharyngeal exudate.  Cardiovascular: Normal rate, regular rhythm and normal heart sounds. Exam reveals no gallop and no friction rub.  No murmur heard.  Pulmonary/Chest: Effort normal and breath sounds normal. No respiratory distress.  has no wheezes.  Neck = supple, no nuchal rigidity Abdominal: Soft. Bowel sounds are normal.  exhibits no distension. There is no tenderness.  Lymphadenopathy: no cervical adenopathy. No axillary adenopathy Neurological: alert and oriented to person, place, and time.  Skin: Skin is warm and dry. No rash noted. No erythema.  Psychiatric: a normal mood and affect.  behavior is normal.   Lab Results  Component Value Date   CD4TCELL 16 (L) 12/07/2017   Lab Results  Component Value Date   CD4TABS 470 12/07/2017   CD4TABS 560 09/21/2017   CD4TABS 610 06/17/2017   Lab Results  Component Value Date   HIV1RNAQUANT <20 DETECTED (A) 12/07/2017   Lab Results  Component Value Date   HEPBSAB POS (A) 09/24/2016   No results found for: RPR, LABRPR  CBC Lab Results  Component Value Date   WBC 6.5 06/17/2017   RBC 4.46 06/17/2017   HGB 13.8 06/17/2017   HCT 39.7 06/17/2017  PLT 249 06/17/2017   MCV 89.0 06/17/2017   MCH 30.9 06/17/2017   MCHC 34.8 06/17/2017   RDW 13.7 06/17/2017   LYMPHSABS 2,854 06/17/2017   MONOABS 666 02/15/2017   EOSABS 208 06/17/2017    BMET Lab Results  Component Value Date   NA 140 09/21/2017   K 3.9 09/21/2017   CL 101 09/21/2017   CO2 32 09/21/2017   GLUCOSE 101 (H) 09/21/2017   BUN 18 09/21/2017   CREATININE 0.96 09/21/2017   CALCIUM 9.7 09/21/2017   GFRNONAA 61 09/21/2017   GFRAA 70 09/21/2017       Assessment and Plan  hiv disease = check labs, well controlled in the past  Health screening = will do annual mammo this winter; gave flu vaccine and prevnar 13 today  peripheral neuropathy =asked her to track it a bit more, unclear if it is related to nerve entrapment. Appears intermittent  Intermittent ashthma = finish up steroid dose. Appears back to baseline

## 2018-06-10 LAB — T-HELPER CELL (CD4) - (RCID CLINIC ONLY)
CD4 % Helper T Cell: 12 % — ABNORMAL LOW (ref 33–55)
CD4 T Cell Abs: 370 /uL — ABNORMAL LOW (ref 400–2700)

## 2018-06-13 LAB — HIV-1 RNA QUANT-NO REFLEX-BLD
HIV 1 RNA Quant: <20 copies/mL
HIV-1 RNA Quant, Log: <1.3 Log copies/mL

## 2018-06-13 LAB — COMPLETE METABOLIC PANEL WITH GFR
AG Ratio: 1.7 (calc) (ref 1.0–2.5)
ALT: 23 U/L (ref 6–29)
AST: 24 U/L (ref 10–35)
Albumin: 4.4 g/dL (ref 3.6–5.1)
Alkaline phosphatase (APISO): 50 U/L (ref 33–130)
BUN/Creatinine Ratio: 18 (calc) (ref 6–22)
BUN: 20 mg/dL (ref 7–25)
CO2: 35 mmol/L — ABNORMAL HIGH (ref 20–32)
Calcium: 10.1 mg/dL (ref 8.6–10.4)
Chloride: 99 mmol/L (ref 98–110)
Creat: 1.11 mg/dL — ABNORMAL HIGH (ref 0.50–0.99)
GFR, Est African American: 59 mL/min/{1.73_m2} — ABNORMAL LOW (ref >=60)
GFR, Est Non African American: 51 mL/min/{1.73_m2} — ABNORMAL LOW (ref >=60)
Globulin: 2.6 g/dL (calc) (ref 1.9–3.7)
Glucose, Bld: 138 mg/dL — ABNORMAL HIGH (ref 65–99)
Potassium: 3.3 mmol/L — ABNORMAL LOW (ref 3.5–5.3)
Sodium: 141 mmol/L (ref 135–146)
Total Bilirubin: 1 mg/dL (ref 0.2–1.2)
Total Protein: 7 g/dL (ref 6.1–8.1)

## 2018-06-13 LAB — CBC WITH DIFFERENTIAL/PLATELET
Basophils Absolute: 121 cells/uL (ref 0–200)
Basophils Relative: 1.2 %
Eosinophils Absolute: 81 cells/uL (ref 15–500)
Eosinophils Relative: 0.8 %
HCT: 41.2 % (ref 35.0–45.0)
Hemoglobin: 14.1 g/dL (ref 11.7–15.5)
Lymphs Abs: 2949 cells/uL (ref 850–3900)
MCH: 31.3 pg (ref 27.0–33.0)
MCHC: 34.2 g/dL (ref 32.0–36.0)
MCV: 91.4 fL (ref 80.0–100.0)
MPV: 9.9 fL (ref 7.5–12.5)
Monocytes Relative: 5.4 %
Neutro Abs: 6403 cells/uL (ref 1500–7800)
Neutrophils Relative %: 63.4 %
Platelets: 294 10*3/uL (ref 140–400)
RBC: 4.51 10*6/uL (ref 3.80–5.10)
RDW: 12.7 % (ref 11.0–15.0)
Total Lymphocyte: 29.2 %
WBC mixed population: 545 cells/uL (ref 200–950)
WBC: 10.1 10*3/uL (ref 3.8–10.8)

## 2018-06-13 LAB — URINALYSIS
Bilirubin Urine: NEGATIVE
Glucose, UA: NEGATIVE
Hgb urine dipstick: NEGATIVE
Ketones, ur: NEGATIVE
Nitrite: NEGATIVE
Protein, ur: NEGATIVE
Specific Gravity, Urine: 1.021 (ref 1.001–1.03)
pH: <=5 (ref 5.0–8.0)

## 2018-07-06 ENCOUNTER — Telehealth: Payer: Self-pay | Admitting: Internal Medicine

## 2018-07-06 NOTE — Telephone Encounter (Signed)
I left a message asking the pt to call me at [phone] to schedule AWV on Friday morning 07/08/18 if available. VDM (DD)

## 2018-07-11 ENCOUNTER — Encounter: Payer: Self-pay | Admitting: Nurse Practitioner

## 2018-07-11 ENCOUNTER — Ambulatory Visit (INDEPENDENT_AMBULATORY_CARE_PROVIDER_SITE_OTHER): Payer: 59

## 2018-07-11 ENCOUNTER — Ambulatory Visit (INDEPENDENT_AMBULATORY_CARE_PROVIDER_SITE_OTHER): Payer: 59 | Admitting: Nurse Practitioner

## 2018-07-11 VITALS — BP 122/76 | HR 69 | Temp 97.8°F | Ht 68.0 in | Wt 183.0 lb

## 2018-07-11 DIAGNOSIS — R300 Dysuria: Secondary | ICD-10-CM

## 2018-07-11 DIAGNOSIS — Z0000 Encounter for general adult medical examination without abnormal findings: Secondary | ICD-10-CM | POA: Diagnosis not present

## 2018-07-11 DIAGNOSIS — K5900 Constipation, unspecified: Secondary | ICD-10-CM

## 2018-07-11 DIAGNOSIS — G4700 Insomnia, unspecified: Secondary | ICD-10-CM

## 2018-07-11 LAB — POCT URINALYSIS DIPSTICK
Bilirubin, UA: NEGATIVE
Glucose, UA: NEGATIVE
Ketones, UA: NEGATIVE
Nitrite, UA: NEGATIVE
Protein, UA: POSITIVE — AB
Spec Grav, UA: 1.025 (ref 1.010–1.025)
Urobilinogen, UA: 0.2 E.U./dL
pH, UA: 5 (ref 5.0–8.0)

## 2018-07-11 MED ORDER — SULFAMETHOXAZOLE-TRIMETHOPRIM 800-160 MG PO TABS: 1.0000 | ORAL_TABLET | Freq: Two times a day (BID) | ORAL | 0 refills | Status: DC

## 2018-07-11 MED ORDER — ZOSTER VAC RECOMB ADJUVANTED 50 MCG/0.5ML IM SUSR: 0.5000 mL | Freq: Once | INTRAMUSCULAR | 0 refills | Status: AC

## 2018-07-11 MED ORDER — TRAZODONE HCL 50 MG PO TABS: 25.0000 mg | ORAL_TABLET | Freq: Every day | ORAL | 3 refills | Status: DC

## 2018-07-11 MED ORDER — PHENAZOPYRIDINE HCL 100 MG PO TABS: 100.0000 mg | ORAL_TABLET | Freq: Three times a day (TID) | ORAL | 0 refills | Status: DC | PRN

## 2018-07-11 NOTE — Patient Instructions (Addendum)
Maria Adkins , Thank you for taking time to come for your Medicare Wellness Visit. I appreciate your ongoing commitment to your health goals. Please review the following plan we discussed and let me know if I can assist you in the future.   Screening recommendations/referrals: Colonoscopy up to date, due 11/03/2023 Mammogram up to date, due 05/12/2019 Bone Density up to date Recommended yearly ophthalmology/optometry visit for glaucoma screening and checkup Recommended yearly dental visit for hygiene and checkup  Vaccinations: Influenza vaccine up to date Pneumococcal vaccine up to date, completed Tdap vaccine up to date, due 09/22/2027 Shingles vaccine please get you second shot when it is avilable  Advanced directives: We do not have a copy in your chart  Conditions/risks identified: none  Next appointment: Maria Dense, RN 07/13/2019 @ 9am   Preventive Care 65 Years and Older, Female Preventive care refers to lifestyle choices and visits with your health care provider that can promote health and wellness. What does preventive care include?  A yearly physical exam. This is also called an annual well check.  Dental exams once or twice a year.  Routine eye exams. Ask your health care provider how often you should have your eyes checked.  Personal lifestyle choices, including:  Daily care of your teeth and gums.  Regular physical activity.  Eating a healthy diet.  Avoiding tobacco and drug use.  Limiting alcohol use.  Practicing safe sex.  Taking low-dose aspirin every day.  Taking vitamin and mineral supplements as recommended by your health care provider. What happens during an annual well check? The services and screenings done by your health care provider during your annual well check will depend on your age, overall health, lifestyle risk factors, and family history of disease. Counseling  Your health care provider may ask you questions about your:  Alcohol  use.  Tobacco use.  Drug use.  Emotional well-being.  Home and relationship well-being.  Sexual activity.  Eating habits.  History of falls.  Memory and ability to understand (cognition).  Work and work Statistician.  Reproductive health. Screening  You may have the following tests or measurements:  Height, weight, and BMI.  Blood pressure.  Lipid and cholesterol levels. These may be checked every 5 years, or more frequently if you are over 69 years old.  Skin check.  Lung cancer screening. You may have this screening every year starting at age 16 if you have a 30-pack-year history of smoking and currently smoke or have quit within the past 15 years.  Fecal occult blood test (FOBT) of the stool. You may have this test every year starting at age 50.  Flexible sigmoidoscopy or colonoscopy. You may have a sigmoidoscopy every 5 years or a colonoscopy every 10 years starting at age 65.  Hepatitis C blood test.  Hepatitis B blood test.  Sexually transmitted disease (STD) testing.  Diabetes screening. This is done by checking your blood sugar (glucose) after you have not eaten for a while (fasting). You may have this done every 1-3 years.  Bone density scan. This is done to screen for osteoporosis. You may have this done starting at age 3.  Mammogram. This may be done every 1-2 years. Talk to your health care provider about how often you should have regular mammograms. Talk with your health care provider about your test results, treatment options, and if necessary, the need for more tests. Vaccines  Your health care provider may recommend certain vaccines, such as:  Influenza vaccine. This is  recommended every year.  Tetanus, diphtheria, and acellular pertussis (Tdap, Td) vaccine. You may need a Td booster every 10 years.  Zoster vaccine. You may need this after age 38.  Pneumococcal 13-valent conjugate (PCV13) vaccine. One dose is recommended after age  62.  Pneumococcal polysaccharide (PPSV23) vaccine. One dose is recommended after age 50. Talk to your health care provider about which screenings and vaccines you need and how often you need them. This information is not intended to replace advice given to you by your health care provider. Make sure you discuss any questions you have with your health care provider. Document Released: 09/27/2015 Document Revised: 05/20/2016 Document Reviewed: 07/02/2015 Elsevier Interactive Patient Education  2017 [place] Prevention in the Home Falls can cause injuries. They can happen to people of all ages. There are many things you can do to make your home safe and to help prevent falls. What can I do on the outside of my home?  Regularly fix the edges of walkways and driveways and fix any cracks.  Remove anything that might make you trip as you walk through a door, such as a raised step or threshold.  Trim any bushes or trees on the path to your home.  Use bright outdoor lighting.  Clear any walking paths of anything that might make someone trip, such as rocks or tools.  Regularly check to see if handrails are loose or broken. Make sure that both sides of any steps have handrails.  Any raised decks and porches should have guardrails on the edges.  Have any leaves, snow, or ice cleared regularly.  Use sand or salt on walking paths during winter.  Clean up any spills in your garage right away. This includes oil or grease spills. What can I do in the bathroom?  Use night lights.  Install grab bars by the toilet and in the tub and shower. Do not use towel bars as grab bars.  Use non-skid mats or decals in the tub or shower.  If you need to sit down in the shower, use a plastic, non-slip stool.  Keep the floor dry. Clean up any water that spills on the floor as soon as it happens.  Remove soap buildup in the tub or shower regularly.  Attach bath mats securely with double-sided  non-slip rug tape.  Do not have throw rugs and other things on the floor that can make you trip. What can I do in the bedroom?  Use night lights.  Make sure that you have a light by your bed that is easy to reach.  Do not use any sheets or blankets that are too big for your bed. They should not hang down onto the floor.  Have a firm chair that has side arms. You can use this for support while you get dressed.  Do not have throw rugs and other things on the floor that can make you trip. What can I do in the kitchen?  Clean up any spills right away.  Avoid walking on wet floors.  Keep items that you use a lot in easy-to-reach places.  If you need to reach something above you, use a strong step stool that has a grab bar.  Keep electrical cords out of the way.  Do not use floor polish or wax that makes floors slippery. If you must use wax, use non-skid floor wax.  Do not have throw rugs and other things on the floor that can make you trip.  What can I do with my stairs?  Do not leave any items on the stairs.  Make sure that there are handrails on both sides of the stairs and use them. Fix handrails that are broken or loose. Make sure that handrails are as long as the stairways.  Check any carpeting to make sure that it is firmly attached to the stairs. Fix any carpet that is loose or worn.  Avoid having throw rugs at the top or bottom of the stairs. If you do have throw rugs, attach them to the floor with carpet tape.  Make sure that you have a light switch at the top of the stairs and the bottom of the stairs. If you do not have them, ask someone to add them for you. What else can I do to help prevent falls?  Wear shoes that:  Do not have high heels.  Have rubber bottoms.  Are comfortable and fit you well.  Are closed at the toe. Do not wear sandals.  If you use a stepladder:  Make sure that it is fully opened. Do not climb a closed stepladder.  Make sure that both  sides of the stepladder are locked into place.  Ask someone to hold it for you, if possible.  Clearly mark and make sure that you can see:  Any grab bars or handrails.  First and last steps.  Where the edge of each step is.  Use tools that help you move around (mobility aids) if they are needed. These include:  Canes.  Walkers.  Scooters.  Crutches.  Turn on the lights when you go into a dark area. Replace any light bulbs as soon as they burn out.  Set up your furniture so you have a clear path. Avoid moving your furniture around.  If any of your floors are uneven, fix them.  If there are any pets around you, be aware of where they are.  Review your medicines with your doctor. Some medicines can make you feel dizzy. This can increase your chance of falling. Ask your doctor what other things that you can do to help prevent falls. This information is not intended to replace advice given to you by your health care provider. Make sure you discuss any questions you have with your health care provider. Document Released: 06/27/2009 Document Revised: 02/06/2016 Document Reviewed: 10/05/2014 Elsevier Interactive Patient Education  2017 [employer].

## 2018-07-11 NOTE — Progress Notes (Signed)
Subjective:   Maria Adkins is a 67 y.o. female who presents for Medicare Annual (Subsequent) preventive examination.  Last AWV-09/16/2016    Objective:     Vitals: BP 122/76 (BP Location: Left Arm, Patient Position: Sitting)   Pulse 69   Temp 97.8 F (36.6 C) (Oral)   Ht 5\' 8"  (1.727 m)   Wt 183 lb (83 kg)   BMI 27.83 kg/m   Body mass index is 27.83 kg/m.  Advanced Directives 07/11/2018 07/11/2018 06/05/2018 01/24/2018 12/26/2017 02/12/2017 01/13/2017  Does Patient Have a Medical Advance Directive? No No No No Yes No No  Type of Advance Directive - - - - Press photographer;Living will - -  Does patient want to make changes to medical advance directive? - - - - - - -  Copy of [place] in Chart? - - - - No - copy requested - -  Would patient like information on creating a medical advance directive? No - Patient declined - - - - - -    Tobacco Social History   Tobacco Use  Smoking Status Former Smoker  . Packs/day: 1.00  . Years: 40.00  . Pack years: 40.00  . Types: Cigarettes  Smokeless Tobacco Never Used     Counseling given: Not Answered   Clinical Intake:  Pre-visit preparation completed: No  Pain : No/denies pain     Diabetes: No  How often do you need to have someone help you when you read instructions, pamphlets, or other written materials from your doctor or pharmacy?: 1 - Never What is the last grade level you completed in school?: community college  Interpreter Needed?: No  Information entered by :: [name], RN  Past Medical History:  Diagnosis Date  . Asthma   . High blood pressure   . HIV (human immunodeficiency virus infection) ([place])   . Osteoarthritis    Past Surgical History:  Procedure Laterality Date  . Biopsy of Liver    . CATARACT EXTRACTION, BILATERAL    . CHOLECYSTECTOMY    . CYST REMOVAL HAND Left   . LAPAROSCOPIC SALPINGOOPHERECTOMY Right    Family History  Problem Relation Age of Onset  .  Diabetes Mother        died at age 41  . Hypertension Mother   . Asthma Sister   . Diabetes Sister   . Hypertension Sister   . Arthritis Sister   . Diabetes Sister   . Hypertension Sister   . Asthma Sister   . Arthritis Sister   . Asthma Son        controlled   Social History   Socioeconomic History  . Marital status: Married    Spouse name: Not on file  . Number of children: Not on file  . Years of education: Not on file  . Highest education level: Not on file  Occupational History  . Not on file  Social Needs  . Financial resource strain: Not hard at all  . Food insecurity:    Worry: Never true    Inability: Never true  . Transportation needs:    Medical: No    Non-medical: No  Tobacco Use  . Smoking status: Former Smoker    Packs/day: 1.00    Years: 40.00    Pack years: 40.00    Types: Cigarettes  . Smokeless tobacco: Never Used  Substance and Sexual Activity  . Alcohol use: No  . Drug use: No  . Sexual activity: Yes  Partners: Male    Birth control/protection: Condom    Comment: declined condoms  Lifestyle  . Physical activity:    Days per week: 0 days    Minutes per session: 0 min  . Stress: To some extent  Relationships  . Social connections:    Talks on phone: More than three times a week    Gets together: More than three times a week    Attends religious service: More than 4 times per year    Active member of club or organization: No    Attends meetings of clubs or organizations: Never    Relationship status: Married  Other Topics Concern  . Not on file  Social History Narrative   Diet? Regular      Do you drink/eat things with caffeine? Coffee      Marital status?  Married                                  What year were you married? March 16, 2009      Do you live in a house, apartment, assisted living, condo, trailer, etc.? House      Is it one or more stories? 1 story      How many persons live in your home? 2      Do you have any  pets in your home? (please list) yes-dog      Current or past profession: Administrator      Do you exercise?  no                                    Type & how often? n/a      Do you have a living will? no      Do you have a DNR form?     no                             If not, do you want to discuss one? Yes      Do you have signed POA/HPOA for forms? no       Outpatient Encounter Medications as of 07/11/2018  Medication Sig  . albuterol (PROVENTIL) (2.5 MG/3ML) 0.083% nebulizer solution Take 3 mLs (2.5 mg total) by nebulization every 4 (four) hours as needed for wheezing or shortness of breath.  . bictegravir-emtricitabine-tenofovir AF (BIKTARVY) 50-200-25 MG TABS tablet Take 1 tablet by mouth daily.  . budesonide-formoterol (SYMBICORT) 160-4.5 MCG/ACT inhaler Inhale two puffs into the lungs every morning  . enalapril-hydrochlorothiazide (VASERETIC) 10-25 MG tablet TAKE 1 TABLET BY MOUTH EVERY DAY  . Melatonin 1 MG TABS Take 5 mg by mouth at bedtime.   . metoprolol tartrate (LOPRESSOR) 100 MG tablet TAKE 1 TABLET BY MOUTH EVERY DAY  . montelukast (SINGULAIR) 10 MG tablet Take 1 tablet (10 mg total) by mouth at bedtime.  . phenazopyridine (PYRIDIUM) 100 MG tablet Take 1 tablet (100 mg total) by mouth 3 (three) times daily as needed for pain (urinary).  [name] sulfamethoxazole-trimethoprim (BACTRIM DS) 800-160 MG tablet Take 1 tablet by mouth 2 (two) times daily.  . traZODone (DESYREL) 50 MG tablet Take 0.5 tablets (25 mg total) by mouth at bedtime.  [name] Zoster Vaccine Adjuvanted [facility]) injection Inject 0.5 mLs into the muscle once for 1 dose.  . [DISCONTINUED] Zoster  Vaccine Adjuvanted [facility]) injection Inject 0.5 mLs into the muscle once.   No facility-administered encounter medications on file as of 07/11/2018.     Activities of Daily Living In your present state of health, do you have any difficulty performing the following activities: 07/11/2018  Hearing? N  Vision? N    Difficulty concentrating or making decisions? N  Walking or climbing stairs? N  Dressing or bathing? N  Doing errands, shopping? N  Preparing Food and eating ? N  Using the Toilet? N  In the past six months, have you accidently leaked urine? Y  Do you have problems with loss of bowel control? N  Managing your Medications? N  Managing your Finances? N  Housekeeping or managing your Housekeeping? N  Some recent data might be hidden    Patient Care Team: [name], DO as PCP - General (Internal Medicine)    Assessment:   This is a routine wellness examination for Maria Adkins.  Exercise Activities and Dietary recommendations Current Exercise Habits: The patient does not participate in regular exercise at present, Exercise limited by: None identified  Goals    . Increase physical activity     Starting 09/16/2016, I will attempt to increase my physical activity to 3 times per day.        Fall Risk Fall Risk  07/11/2018 07/11/2018 06/09/2018 01/24/2018 01/11/2018  Falls in the past year? No No No No No   Is the patient's home free of loose throw rugs in walkways, pet beds, electrical cords, etc?   yes      Grab bars in the bathroom? no      Handrails on the stairs?   yes      Adequate lighting?   yes  Depression Screen PHQ 2/9 Scores 07/11/2018 06/09/2018 12/07/2017 09/21/2017  PHQ - 2 Score 2 0 0 0  PHQ- 9 Score 6 - - -     Cognitive Function MMSE - Mini Mental State Exam 07/11/2018 09/16/2016  Orientation to time 5 5  Orientation to Place 5 5  Registration 3 3  Attention/ Calculation 5 5  Recall 2 3  Language- name 2 objects 2 2  Language- repeat 1 1  Language- follow 3 step command 3 3  Language- read & follow direction 1 1  Write a sentence 1 1  Copy design 1 1  Total score 29 30        Immunization History  Administered Date(s) Administered  . Influenza,inj,Quad PF,6+ Mos 06/17/2017, 06/09/2018  . Influenza-Unspecified 06/11/2016  . Pneumococcal Conjugate-13  05/20/2018  . Pneumococcal Polysaccharide-23 02/15/2017  . Tdap 09/21/2017    Qualifies for Shingles Vaccine? Yes, educated and second shot due. Order sent  Screening Tests Health Maintenance  Topic Date Due  . PAP SMEAR  [birth year]  . MAMMOGRAM  05/12/2019  . COLONOSCOPY  11/03/2023  . TETANUS/TDAP  09/22/2027  . INFLUENZA VACCINE  Completed  . DEXA SCAN  Completed  . Hepatitis C Screening  Completed  . PNA vac Low Risk Adult  Completed    Cancer Screenings: Lung: Low Dose CT Chest recommended if Age 51-80 years, 30 pack-year currently smoking OR have quit w/in 15years. Patient does not qualify. Breast:  Up to date on Mammogram? Yes   Up to date of Bone Density/Dexa? Yes Colorectal: up to date  Additional Screenings: Hepatitis C Screening: declined     Plan:    I have personally reviewed and addressed the Medicare Annual Wellness questionnaire and have noted the  following in the patient's chart:  A. Medical and social history B. Use of alcohol, tobacco or illicit drugs  C. Current medications and supplements D. Functional ability and status E.  Nutritional status F.  Physical activity G. Advance directives H. List of other physicians I.  Hospitalizations, surgeries, and ER visits in previous 12 months J.  [place] to include hearing, vision, cognitive, depression L. Referrals and appointments - none  In addition, I have reviewed and discussed with patient certain preventive protocols, quality metrics, and best practice recommendations. A written personalized care plan for preventive services as well as general preventive health recommendations were provided to patient.  See attached scanned questionnaire for additional information.   Signed,   [name], RN Nurse Health Advisor  Patient Concerns: None

## 2018-07-11 NOTE — Patient Instructions (Addendum)
Recommend cognitive behavioral therapy for sleep To start trazodone 25 mg by mouth at bedtime for sleep Bedtime routine every night White noise may be beneficial  Increase water intake Can use pyridium 100 mg (prescription sent but this is OTC) by mouth every 8 hours as needed to help with burning, frequency and urgency- will cause urine to be orange so be aware-- only take if needed  To start bactrim DS 1 tablet twice daily with food (to prevent upset stomach) for 1 week To use probiotic (OTC)  twice daily to help GI upset   To use colace 100 mg by mouth daily for constipation    Insomnia Insomnia is a sleep disorder that makes it difficult to fall asleep or to stay asleep. Insomnia can cause tiredness (fatigue), low energy, difficulty concentrating, mood swings, and poor performance at work or school. There are three different ways to classify insomnia:  Difficulty falling asleep.  Difficulty staying asleep.  Waking up too early in the morning.  Any type of insomnia can be long-term (chronic) or short-term (acute). Both are common. Short-term insomnia usually lasts for three months or less. Chronic insomnia occurs at least three times a week for longer than three months. What are the causes? Insomnia may be caused by another condition, situation, or substance, such as:  Anxiety.  Certain medicines.  Gastroesophageal reflux disease (GERD) or other gastrointestinal conditions.  Asthma or other breathing conditions.  Restless legs syndrome, sleep apnea, or other sleep disorders.  Chronic pain.  Menopause. This may include hot flashes.  Stroke.  Abuse of alcohol, tobacco, or illegal drugs.  Depression.  Caffeine.  Neurological disorders, such as Alzheimer disease.  An overactive thyroid (hyperthyroidism).  The cause of insomnia may not be known. What increases the risk? Risk factors for insomnia include:  Gender. Women are more commonly affected than  men.  Age. Insomnia is more common as you get older.  Stress. This may involve your professional or personal life.  Income. Insomnia is more common in people with lower income.  Lack of exercise.  Irregular work schedule or night shifts.  Traveling between different time zones.  What are the signs or symptoms? If you have insomnia, trouble falling asleep or trouble staying asleep is the main symptom. This may lead to other symptoms, such as:  Feeling fatigued.  Feeling nervous about going to sleep.  Not feeling rested in the morning.  Having trouble concentrating.  Feeling irritable, anxious, or depressed.  How is this treated? Treatment for insomnia depends on the cause. If your insomnia is caused by an underlying condition, treatment will focus on addressing the condition. Treatment may also include:  Medicines to help you sleep.  Counseling or therapy.  Lifestyle adjustments.  Follow these instructions at home:  Take medicines only as directed by your health care provider.  Keep regular sleeping and waking hours. Avoid naps.  Keep a sleep diary to help you and your health care provider figure out what could be causing your insomnia. Include: ? When you sleep. ? When you wake up during the night. ? How well you sleep. ? How rested you feel the next day. ? Any side effects of medicines you are taking. ? What you eat and drink.  Make your bedroom a comfortable place where it is easy to fall asleep: ? Put up shades or special blackout curtains to block light from outside. ? Use a white noise machine to block noise. ? Keep the temperature cool.  Exercise  regularly as directed by your health care provider. Avoid exercising right before bedtime.  Use relaxation techniques to manage stress. Ask your health care provider to suggest some techniques that may work well for you. These may include: ? Breathing exercises. ? Routines to release muscle  tension. ? Visualizing peaceful scenes.  Cut back on alcohol, caffeinated beverages, and cigarettes, especially close to bedtime. These can disrupt your sleep.  Do not overeat or eat spicy foods right before bedtime. This can lead to digestive discomfort that can make it hard for you to sleep.  Limit screen use before bedtime. This includes: ? Watching TV. ? Using your smartphone, tablet, and computer.  Stick to a routine. This can help you fall asleep faster. Try to do a quiet activity, brush your teeth, and go to bed at the same time each night.  Get out of bed if you are still awake after 15 minutes of trying to sleep. Keep the lights down, but try reading or doing a quiet activity. When you feel sleepy, go back to bed.  Make sure that you drive carefully. Avoid driving if you feel very sleepy.  Keep all follow-up appointments as directed by your health care provider. This is important. Contact a health care provider if:  You are tired throughout the day or have trouble in your daily routine due to sleepiness.  You continue to have sleep problems or your sleep problems get worse. Get help right away if:  You have serious thoughts about hurting yourself or someone else. This information is not intended to replace advice given to you by your health care provider. Make sure you discuss any questions you have with your health care provider. Document Released: 08/28/2000 Document Revised: 01/31/2016 Document Reviewed: 06/01/2014 Elsevier Interactive Patient Education  [employer].

## 2018-07-11 NOTE — Progress Notes (Signed)
Careteam: Patient Care Team: [name], DO as PCP - General (Internal Medicine)  Advanced Directive information Does Patient Have a Medical Advance Directive?: No  No Known Allergies  Chief Complaint  Patient presents with  . Acute Visit    Pt is being seen due to lower abdominal pain/pressure and blood in urine. Pt also wants to discuss getting sleep medication Lorrin Mais)     HPI: Patient is a 67 y.o. female seen in the office today due to pain with urination. Reports she had frequency, urgency last night. Never has this issues.  No blood in the toilet but blood on the tissue when she wiped.  Has a pressure and a pain in her lower abdomen.  Last UTI was last year.  Does not drink enough water No diarrhea. Has constipation.  Has been using baby wipes to cleanse herself after you go to the bathroom.  No fevers or chills.   Reports she can not sleep. In the past used Ambien, has tried melatonin. Reports she has a sleep routine, sleeps with the room dark. Only in the bed for sleep.   Review of Systems:  Review of Systems  Constitutional: Negative for chills, fever and malaise/fatigue.  Gastrointestinal: Positive for constipation (once a week).  Genitourinary: Positive for dysuria, frequency, hematuria and urgency. Negative for flank pain.  Psychiatric/Behavioral: Negative for depression. The patient has insomnia.     Past Medical History:  Diagnosis Date  . Asthma   . High blood pressure   . HIV (human immunodeficiency virus infection) ([place])   . Osteoarthritis    Past Surgical History:  Procedure Laterality Date  . Biopsy of Liver    . CATARACT EXTRACTION, BILATERAL    . CHOLECYSTECTOMY    . CYST REMOVAL HAND Left   . LAPAROSCOPIC SALPINGOOPHERECTOMY Right    Social History:   reports that she has quit smoking. Her smoking use included cigarettes. She has a 40.00 pack-year smoking history. She has never used smokeless tobacco. She reports that she does not  drink alcohol or use drugs.  Family History  Problem Relation Age of Onset  . Diabetes Mother        died at age 26  . Hypertension Mother   . Asthma Sister   . Diabetes Sister   . Hypertension Sister   . Arthritis Sister   . Diabetes Sister   . Hypertension Sister   . Asthma Sister   . Arthritis Sister   . Asthma Son        controlled    Medications: Patient's Medications  New Prescriptions   No medications on file  Previous Medications   ALBUTEROL (PROVENTIL) (2.5 MG/3ML) 0.083% NEBULIZER SOLUTION    Take 3 mLs (2.5 mg total) by nebulization every 4 (four) hours as needed for wheezing or shortness of breath.   BICTEGRAVIR-EMTRICITABINE-TENOFOVIR AF (BIKTARVY) 50-200-25 MG TABS TABLET    Take 1 tablet by mouth daily.   BUDESONIDE-FORMOTEROL (SYMBICORT) 160-4.5 MCG/ACT INHALER    Inhale two puffs into the lungs every morning   ENALAPRIL-HYDROCHLOROTHIAZIDE (VASERETIC) 10-25 MG TABLET    TAKE 1 TABLET BY MOUTH EVERY DAY   MELATONIN 1 MG TABS    Take 5 mg by mouth at bedtime.    METOPROLOL TARTRATE (LOPRESSOR) 100 MG TABLET    TAKE 1 TABLET BY MOUTH EVERY DAY   MONTELUKAST (SINGULAIR) 10 MG TABLET    Take 1 tablet (10 mg total) by mouth at bedtime.  Modified Medications   No  medications on file  Discontinued Medications   PREDNISONE (DELTASONE) 20 MG TABLET    Take 2 tablets (40 mg total) by mouth daily.   SULFAMETHOXAZOLE-TRIMETHOPRIM (BACTRIM DS) 800-160 MG TABLET    Take 1 tablet by mouth 2 (two) times daily.     Physical Exam:  Vitals:   07/11/18 1101  BP: 122/76  Pulse: 69  Temp: 97.8 F (36.6 C)  TempSrc: Oral  SpO2: 96%  Weight: 183 lb (83 kg)  Height: 5\' 8"  (1.727 m)   Body mass index is 27.83 kg/m.  Physical Exam  Constitutional: She is oriented to person, place, and time. She appears well-developed and well-nourished.  Cardiovascular: Normal rate, regular rhythm and normal heart sounds.  Pulmonary/Chest: Effort normal and breath sounds normal.    Abdominal: Soft. Bowel sounds are normal. She exhibits no distension. There is no tenderness. There is no guarding.  Neurological: She is alert and oriented to person, place, and time.  Skin: Skin is warm and dry.    Labs reviewed: Basic Metabolic Panel: Recent Labs    08/16/17 0811 09/21/17 0917 06/09/18 1604  NA  --  140 141  K  --  3.9 3.3*  CL  --  101 99  CO2  --  32 35*  GLUCOSE  --  101* 138*  BUN  --  18 20  CREATININE  --  0.96 1.11*  CALCIUM  --  9.7 10.1  TSH 1.43  --   --    Liver Function Tests: Recent Labs    08/16/17 0811 09/21/17 0917 06/09/18 1604  AST 19 16 24   ALT 14 17 23   BILITOT 1.3* 0.6 1.0  PROT 6.9 7.0 7.0   No results for input(s): LIPASE, AMYLASE in the last 8760 hours. No results for input(s): AMMONIA in the last 8760 hours. CBC: Recent Labs    06/09/18 1604  WBC 10.1  NEUTROABS 6,403  HGB 14.1  HCT 41.2  MCV 91.4  PLT 294   Lipid Panel: Recent Labs    08/16/17 0811  CHOL 219*  HDL 45*  LDLCALC 147*  TRIG 145  CHOLHDL 4.9   TSH: Recent Labs    08/16/17 0811  TSH 1.43   A1C: No results found for: HGBA1C   Assessment/Plan 1. Dysuria - POCT urinalysis dipstick- abnormal (blood and positive leukocytes) and will send for culture  -increase fluids - Culture, Urine; Future - sulfamethoxazole-trimethoprim (BACTRIM DS) 800-160 MG tablet; Take 1 tablet by mouth 2 (two) times daily.  Dispense: 14 tablet; Refill: 0 - phenazopyridine (PYRIDIUM) 100 MG tablet; Take 1 tablet (100 mg total) by mouth 3 (three) times daily as needed for pain (urinary).  Dispense: 10 tablet; Refill: 0 - Culture, Urine  2. Insomnia, unspecified type -again discussed bedtime routine -can try white noise -encouraged cognitive behavioral therapy for treatment - traZODone (DESYREL) 50 MG tablet; Take 0.5 tablets (25 mg total) by mouth at bedtime.  Dispense: 30 tablet; Refill: 3  3. Constipation -colace was not effective, encouraged to use miralax  17 gm daily, can use every other day if needed  Next appt: 6 weeks for follow up on insomnia and routine follow up.  Carlos American. [place], [facility] [phone]

## 2018-07-13 ENCOUNTER — Other Ambulatory Visit: Payer: Self-pay | Admitting: Nurse Practitioner

## 2018-07-13 LAB — URINE CULTURE
MICRO NUMBER:: 91293286
SPECIMEN QUALITY:: ADEQUATE

## 2018-07-13 MED ORDER — AMOXICILLIN-POT CLAVULANATE 875-125 MG PO TABS: 1.0000 | ORAL_TABLET | Freq: Two times a day (BID) | ORAL | 0 refills | Status: DC

## 2018-07-13 NOTE — Progress Notes (Signed)
a 

## 2018-07-14 ENCOUNTER — Ambulatory Visit: Payer: Self-pay

## 2018-07-19 MED FILL — SHINGRIX 50 MCG SUS: 50 | 1 days supply | Qty: 1 | Fill #0

## 2018-07-20 ENCOUNTER — Telehealth: Payer: Self-pay | Admitting: *Deleted

## 2018-07-20 NOTE — Telephone Encounter (Signed)
RN received appointment request from patient via [place].  Patient is concerned about the downward trend in her CD4 count, would like to speak with Dr Baxter Flattery at her next available appointment to make sure she is ok.  Patient feels she has been getting sick more often, is worried that her disease will progress to AIDS. RN spoke with patient discussing her other lab results, offered her an appointment 11/12 8:45. Patient accepted. Landis Gandy, RN

## 2018-07-26 ENCOUNTER — Encounter: Payer: Self-pay | Admitting: Internal Medicine

## 2018-07-26 ENCOUNTER — Other Ambulatory Visit: Payer: Self-pay | Admitting: *Deleted

## 2018-07-26 ENCOUNTER — Ambulatory Visit (INDEPENDENT_AMBULATORY_CARE_PROVIDER_SITE_OTHER): Payer: 59 | Admitting: Internal Medicine

## 2018-07-26 VITALS — BP 126/83 | HR 71 | Temp 98.3°F | Ht 67.0 in | Wt 184.0 lb

## 2018-07-26 DIAGNOSIS — I10 Essential (primary) hypertension: Secondary | ICD-10-CM

## 2018-07-26 DIAGNOSIS — B20 Human immunodeficiency virus [HIV] disease: Secondary | ICD-10-CM

## 2018-07-26 MED ORDER — ENALAPRIL-HYDROCHLOROTHIAZIDE 10-25 MG PO TABS: 1.0000 | ORAL_TABLET | Freq: Every day | ORAL | 1 refills | Status: DC

## 2018-07-26 NOTE — Progress Notes (Signed)
[name], female   DOB: 08/15/1951, 67 y.o.   MRN: [id number]  HPI Maria Adkins is a 67yo F with well controlled hiv disease, on biktarvy, however  She comes into clinic due to being Concerned about her health, CD 4 count trending down. Worried about her mortality if she is going to be around for her 23yr old grandson. Numerous stressors, unable to sleep. She is tearful today discussing what is on her mind.  Outpatient Encounter Medications as of 07/26/2018  Medication Sig  . albuterol (PROVENTIL) (2.5 MG/3ML) 0.083% nebulizer solution Take 3 mLs (2.5 mg total) by nebulization every 4 (four) hours as needed for wheezing or shortness of breath.  [name] amoxicillin-clavulanate (AUGMENTIN) 875-125 MG tablet Take 1 tablet by mouth 2 (two) times daily.  . bictegravir-emtricitabine-tenofovir AF (BIKTARVY) 50-200-25 MG TABS tablet Take 1 tablet by mouth daily.  . budesonide-formoterol (SYMBICORT) 160-4.5 MCG/ACT inhaler Inhale two puffs into the lungs every morning  . enalapril-hydrochlorothiazide (VASERETIC) 10-25 MG tablet TAKE 1 TABLET BY MOUTH EVERY DAY  . Melatonin 1 MG TABS Take 5 mg by mouth at bedtime.   . metoprolol tartrate (LOPRESSOR) 100 MG tablet TAKE 1 TABLET BY MOUTH EVERY DAY  . montelukast (SINGULAIR) 10 MG tablet Take 1 tablet (10 mg total) by mouth at bedtime.  . phenazopyridine (PYRIDIUM) 100 MG tablet Take 1 tablet (100 mg total) by mouth 3 (three) times daily as needed for pain (urinary).  [name] sulfamethoxazole-trimethoprim (BACTRIM DS) 800-160 MG tablet Take 1 tablet by mouth 2 (two) times daily.  . traZODone (DESYREL) 50 MG tablet Take 0.5 tablets (25 mg total) by mouth at bedtime.   No facility-administered encounter medications on file as of 07/26/2018.      Patient Active Problem List   Diagnosis Date Noted  . Seasonal allergies 01/13/2018  . Asthma 02/05/2017  . Allergy 12/16/2016  . 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 Essential hypertension 08/19/2016    . Human immunodeficiency virus (HIV) disease ([place]) 08/19/2016     Health Maintenance Due  Topic Date Due  . PAP SMEAR  April 29, 1951     Review of Systems + increasing stressor, insomnia. Physical Exam   BP 126/83   Pulse 71   Temp 98.3 F (36.8 C)   Ht 5\' 7"  (1.702 m)   Wt 184 lb (83.5 kg)   BMI 28.82 kg/m   Did not examine Lab Results  Component Value Date   CD4TCELL 12 (L) 06/09/2018   Lab Results  Component Value Date   CD4TABS 370 (L) 06/09/2018   CD4TABS 470 12/07/2017   CD4TABS 560 09/21/2017   Lab Results  Component Value Date   HIV1RNAQUANT <20 NOT DETECTED 06/09/2018   Lab Results  Component Value Date   HEPBSAB POS (A) 09/24/2016   No results found for: RPR, LABRPR  CBC Lab Results  Component Value Date   WBC 10.1 06/09/2018   RBC 4.51 06/09/2018   HGB 14.1 06/09/2018   HCT 41.2 06/09/2018   PLT 294 06/09/2018   MCV 91.4 06/09/2018   MCH 31.3 06/09/2018   MCHC 34.2 06/09/2018   RDW 12.7 06/09/2018   LYMPHSABS 2,949 06/09/2018   MONOABS 666 02/15/2017   EOSABS 81 06/09/2018    BMET Lab Results  Component Value Date   NA 141 06/09/2018   K 3.3 (L) 06/09/2018   CL 99 06/09/2018   CO2 35 (H) 06/09/2018   GLUCOSE 138 (H) 06/09/2018   BUN 20 06/09/2018   CREATININE 1.11 (  H) 06/09/2018   CALCIUM 10.1 06/09/2018   GFRNONAA 51 (L) 06/09/2018   GFRAA 59 (L) 06/09/2018      Assessment and Plan Reassured that despite her cd 4 count having recent dip, it was likely in the setting of recovering from asthma attack. Her viral load remain  Undetectable.   Discussed with her what other issues that may be troubling her and also affecting her sleep.  She gave verbal ok to speak with her son if he chooses to reach out  Will repeat labs in December  Offered her that she can also speak with our clinic counselor  Spent 30 min with patient, greater than 50% in counseling

## 2018-07-26 NOTE — Patient Instructions (Signed)
Come into the clinic in mid December to repeat your lab work.

## 2018-07-26 NOTE — Telephone Encounter (Signed)
[facility]  

## 2018-08-09 ENCOUNTER — Other Ambulatory Visit: Payer: Self-pay | Admitting: Nurse Practitioner

## 2018-08-22 ENCOUNTER — Encounter: Payer: Self-pay | Admitting: Nurse Practitioner

## 2018-08-22 ENCOUNTER — Ambulatory Visit (INDEPENDENT_AMBULATORY_CARE_PROVIDER_SITE_OTHER): Payer: 59 | Admitting: Nurse Practitioner

## 2018-08-22 VITALS — BP 122/84 | HR 63 | Temp 97.9°F | Ht 67.0 in | Wt 181.6 lb

## 2018-08-22 DIAGNOSIS — R3129 Other microscopic hematuria: Secondary | ICD-10-CM

## 2018-08-22 DIAGNOSIS — I10 Essential (primary) hypertension: Secondary | ICD-10-CM | POA: Diagnosis not present

## 2018-08-22 DIAGNOSIS — J45909 Unspecified asthma, uncomplicated: Secondary | ICD-10-CM

## 2018-08-22 DIAGNOSIS — K5900 Constipation, unspecified: Secondary | ICD-10-CM | POA: Diagnosis not present

## 2018-08-22 DIAGNOSIS — J302 Other seasonal allergic rhinitis: Secondary | ICD-10-CM | POA: Diagnosis not present

## 2018-08-22 DIAGNOSIS — E782 Mixed hyperlipidemia: Secondary | ICD-10-CM

## 2018-08-22 DIAGNOSIS — B20 Human immunodeficiency virus [HIV] disease: Secondary | ICD-10-CM

## 2018-08-22 DIAGNOSIS — G4700 Insomnia, unspecified: Secondary | ICD-10-CM

## 2018-08-22 DIAGNOSIS — M810 Age-related osteoporosis without current pathological fracture: Secondary | ICD-10-CM | POA: Diagnosis not present

## 2018-08-22 LAB — COMPLETE METABOLIC PANEL WITH GFR
AG Ratio: 1.6 (calc) (ref 1.0–2.5)
ALT: 15 U/L (ref 6–29)
AST: 19 U/L (ref 10–35)
Albumin: 4.2 g/dL (ref 3.6–5.1)
Alkaline phosphatase (APISO): 45 U/L (ref 33–130)
BUN: 16 mg/dL (ref 7–25)
CO2: 35 mmol/L — ABNORMAL HIGH (ref 20–32)
Calcium: 9.9 mg/dL (ref 8.6–10.4)
Chloride: 100 mmol/L (ref 98–110)
Creat: 0.92 mg/dL (ref 0.50–0.99)
GFR, Est African American: 74 mL/min/{1.73_m2} (ref >=60)
GFR, Est Non African American: 64 mL/min/{1.73_m2} (ref >=60)
Globulin: 2.6 g/dL (calc) (ref 1.9–3.7)
Glucose, Bld: 93 mg/dL (ref 65–99)
Potassium: 4 mmol/L (ref 3.5–5.3)
Sodium: 141 mmol/L (ref 135–146)
Total Bilirubin: 1.3 mg/dL — ABNORMAL HIGH (ref 0.2–1.2)
Total Protein: 6.8 g/dL (ref 6.1–8.1)

## 2018-08-22 LAB — POCT URINALYSIS DIPSTICK
Appearance: NORMAL
Bilirubin, UA: NEGATIVE
Blood, UA: NEGATIVE
Glucose, UA: NEGATIVE
Ketones, UA: NEGATIVE
Leukocytes, UA: NEGATIVE
Nitrite, UA: NEGATIVE
Protein, UA: NEGATIVE
Spec Grav, UA: 1.015 (ref 1.010–1.025)
Urobilinogen, UA: NEGATIVE E.U./dL — AB
pH, UA: 6.5 (ref 5.0–8.0)

## 2018-08-22 LAB — LIPID PANEL
Cholesterol: 207 mg/dL — ABNORMAL HIGH (ref <200)
HDL: 47 mg/dL — ABNORMAL LOW (ref >50)
LDL Cholesterol (Calc): 135 mg/dL (calc) — ABNORMAL HIGH
Non-HDL Cholesterol (Calc): 160 mg/dL (calc) — ABNORMAL HIGH (ref <130)
Total CHOL/HDL Ratio: 4.4 (calc) (ref <5.0)
Triglycerides: 126 mg/dL (ref <150)

## 2018-08-22 NOTE — Progress Notes (Signed)
Careteam: Patient Care Team: Lauree Chandler, NP as PCP - General (Geriatric Medicine)  Advanced Directive information Does Patient Have a Medical Advance Directive?: Yes, Type of Advance Directive: [place];Living will  No Known Allergies  Chief Complaint  Patient presents with  . Medical Management of Chronic Issues    Pt is being seen for a routine visit. Pt has no concerns today.      HPI: Patient is a 67 y.o. female seen in the office today for routine follow up.   Insomnia- has done well with trazodone, uses 1/2 tablet qhs PRN  Constipation-well controlled; was using miralax 17 gm daily now has not needed in about a week  Asthma- well controlled on Symbicort HFA. Rarely uses prn HFA. She takes singulair. Followed by pulmonary Dr Corrie Dandy, has not seen in a while because this has been well controlled.   HIV- following with Dr Lenon Ahmadi routinely   HTN- enalapril-hctz and lopressor BID   OP- last DEXA 04/2017 OSTEOPENIC, not taking calcium or vit d  Reports she had PAP in 2018, unsure by who.  Review of Systems:  Review of Systems  Constitutional: Negative for chills, fever and weight loss.  HENT: Negative for tinnitus.   Respiratory: Negative for cough, sputum production and shortness of breath.   Cardiovascular: Negative for chest pain, palpitations and leg swelling.  Gastrointestinal: Positive for constipation (controlled ). Negative for abdominal pain, diarrhea and heartburn.  Genitourinary: Negative for dysuria, frequency and urgency.  Musculoskeletal: Negative for back pain, falls, joint pain and myalgias.  Skin: Negative.   Neurological: Negative for dizziness and headaches.  Psychiatric/Behavioral: Negative for depression and memory loss. The patient has insomnia (occasionally).     Past Medical History:  Diagnosis Date  . Asthma   . High blood pressure   . HIV (human immunodeficiency virus infection) ([place])   . Osteoarthritis     Past Surgical History:  Procedure Laterality Date  . Biopsy of Liver    . CATARACT EXTRACTION, BILATERAL    . CHOLECYSTECTOMY    . CYST REMOVAL HAND Left   . LAPAROSCOPIC SALPINGOOPHERECTOMY Right    Social History:   reports that she has quit smoking. Her smoking use included cigarettes. She has a 40.00 pack-year smoking history. She has never used smokeless tobacco. She reports that she does not drink alcohol or use drugs.  Family History  Problem Relation Age of Onset  . Diabetes Mother        died at age 31  . Hypertension Mother   . Asthma Sister   . Diabetes Sister   . Hypertension Sister   . Arthritis Sister   . Diabetes Sister   . Hypertension Sister   . Asthma Sister   . Arthritis Sister   . Asthma Son        controlled    Medications: Patient's Medications  New Prescriptions   No medications on file  Previous Medications   ALBUTEROL (PROVENTIL) (2.5 MG/3ML) 0.083% NEBULIZER SOLUTION    Take 3 mLs (2.5 mg total) by nebulization every 4 (four) hours as needed for wheezing or shortness of breath.   BICTEGRAVIR-EMTRICITABINE-TENOFOVIR AF (BIKTARVY) 50-200-25 MG TABS TABLET    Take 1 tablet by mouth daily.   BUDESONIDE-FORMOTEROL (SYMBICORT) 160-4.5 MCG/ACT INHALER    Inhale two puffs into the lungs every morning   ENALAPRIL-HYDROCHLOROTHIAZIDE (VASERETIC) 10-25 MG TABLET    Take 1 tablet by mouth daily.   MELATONIN 1 MG TABS  Take 5 mg by mouth at bedtime.    METOPROLOL TARTRATE (LOPRESSOR) 100 MG TABLET    TAKE 1 TABLET BY MOUTH EVERY DAY   MONTELUKAST (SINGULAIR) 10 MG TABLET    TAKE 1 TABLET BY MOUTH EVERYDAY AT BEDTIME   TRAZODONE (DESYREL) 50 MG TABLET    Take 0.5 tablets (25 mg total) by mouth at bedtime.  Modified Medications   No medications on file  Discontinued Medications   AMOXICILLIN-CLAVULANATE (AUGMENTIN) 875-125 MG TABLET    Take 1 tablet by mouth 2 (two) times daily.   PHENAZOPYRIDINE (PYRIDIUM) 100 MG TABLET    Take 1 tablet (100 mg total)  by mouth 3 (three) times daily as needed for pain (urinary).   SULFAMETHOXAZOLE-TRIMETHOPRIM (BACTRIM DS) 800-160 MG TABLET    Take 1 tablet by mouth 2 (two) times daily.     Physical Exam:  Vitals:   08/22/18 0831  BP: 122/84  Pulse: 63  Temp: 97.9 F (36.6 C)  TempSrc: Oral  SpO2: 96%  Weight: 181 lb 9.6 oz (82.4 kg)  Height: 5\' 7"  (1.702 m)   Body mass index is 28.44 kg/m.  Physical Exam  Constitutional: She is oriented to person, place, and time. She appears well-developed and well-nourished. No distress.  HENT:  Head: Normocephalic and atraumatic.  Mouth/Throat: Oropharynx is clear and moist. No oropharyngeal exudate.  Eyes: Pupils are equal, round, and reactive to light. Conjunctivae are normal.  Neck: Normal range of motion. Neck supple.  Cardiovascular: Normal rate, regular rhythm and normal heart sounds.  Pulmonary/Chest: Effort normal and breath sounds normal.  Abdominal: Soft. Bowel sounds are normal.  Musculoskeletal: She exhibits no edema or tenderness.  Neurological: She is alert and oriented to person, place, and time.  Skin: Skin is warm and dry. She is not diaphoretic.  Psychiatric: She has a normal mood and affect.    Labs reviewed: Basic Metabolic Panel: Recent Labs    09/21/17 0917 06/09/18 1604  NA 140 141  K 3.9 3.3*  CL 101 99  CO2 32 35*  GLUCOSE 101* 138*  BUN 18 20  CREATININE 0.96 1.11*  CALCIUM 9.7 10.1   Liver Function Tests: Recent Labs    09/21/17 0917 06/09/18 1604  AST 16 24  ALT 17 23  BILITOT 0.6 1.0  PROT 7.0 7.0   No results for input(s): LIPASE, AMYLASE in the last 8760 hours. No results for input(s): AMMONIA in the last 8760 hours. CBC: Recent Labs    06/09/18 1604  WBC 10.1  NEUTROABS 6,403  HGB 14.1  HCT 41.2  MCV 91.4  PLT 294   Lipid Panel: No results for input(s): CHOL, HDL, LDLCALC, TRIG, CHOLHDL, LDLDIRECT in the last 8760 hours. TSH: No results for input(s): TSH in the last 8760  hours. A1C: No results found for: HGBA1C   Assessment/Plan 1. Essential hypertension -controlled on enalapril- hctz with metoprolol, continue dietary modifications  2. Uncomplicated asthma, unspecified asthma severity, unspecified whether persistent -stable on symbicort and singulair, no recent flares  3. Age related osteoposrosis, unspecified pathological fracture presence -osteopenia on recent DEXA in 2018, Recommended to take caltrate with D 600/400 twice a day with weight bearing activity.   4. Human immunodeficiency virus (HIV) disease ([place]) Ongoing follow up with ID, continues on Biktarvy daily  5. Seasonal allergies Controlled at this time  6. Insomnia, unspecified type Stable at this time, uses trazodone PRN  7. Constipation, unspecified constipation type stabilized on miralax 17 gm daily, now using PRN  8.  Mixed hyperlipidemia Encouraged dietary modifications, will follow up lab - Lipid Panel - COMPLETE METABOLIC PANEL WITH GFR  9. Microhematuria With UTI, repeat collected today and now Negative   Next appt: 6 months, sooner if needed  Isaid Salvia K. Stephens, [facility] [phone]

## 2018-08-22 NOTE — Patient Instructions (Signed)
Recommended to take caltrate with D 600/400 twice a day.  Weight bearing activity 30 mins 5 days    DASH Eating Plan DASH stands for "Dietary Approaches to Stop Hypertension." The DASH eating plan is a healthy eating plan that has been shown to reduce high blood pressure (hypertension). It may also reduce your risk for type 2 diabetes, heart disease, and stroke. The DASH eating plan may also help with weight loss. What are tips for following this plan? General guidelines  Avoid eating more than 2,300 mg (milligrams) of salt (sodium) a day. If you have hypertension, you may need to reduce your sodium intake to 1,500 mg a day.  Limit alcohol intake to no more than 1 drink a day for nonpregnant women and 2 drinks a day for men. One drink equals 12 oz of beer, 5 oz of wine, or 1 oz of hard liquor.  Work with your health care provider to maintain a healthy body weight or to lose weight. Ask what an ideal weight is for you.  Get at least 30 minutes of exercise that causes your heart to beat faster (aerobic exercise) most days of the week. Activities may include walking, swimming, or biking.  Work with your health care provider or diet and nutrition specialist (dietitian) to adjust your eating plan to your individual calorie needs. Reading food labels  Check food labels for the amount of sodium per serving. Choose foods with less than 5 percent of the Daily Value of sodium. Generally, foods with less than 300 mg of sodium per serving fit into this eating plan.  To find whole grains, look for the word "whole" as the first word in the ingredient list. Shopping  Buy products labeled as "low-sodium" or "no salt added."  Buy fresh foods. Avoid canned foods and premade or frozen meals. Cooking  Avoid adding salt when cooking. Use salt-free seasonings or herbs instead of table salt or sea salt. Check with your health care provider or pharmacist before using salt substitutes.  Do not fry foods.  Cook foods using healthy methods such as baking, boiling, grilling, and broiling instead.  Cook with heart-healthy oils, such as olive, canola, soybean, or sunflower oil. Meal planning   Eat a balanced diet that includes: ? 5 or more servings of fruits and vegetables each day. At each meal, try to fill half of your plate with fruits and vegetables. ? Up to 6-8 servings of whole grains each day. ? Less than 6 oz of lean meat, poultry, or fish each day. A 3-oz serving of meat is about the same size as a deck of cards. One egg equals 1 oz. ? 2 servings of low-fat dairy each day. ? A serving of nuts, seeds, or beans 5 times each week. ? Heart-healthy fats. Healthy fats called Omega-3 fatty acids are found in foods such as flaxseeds and coldwater fish, like sardines, salmon, and mackerel.  Limit how much you eat of the following: ? Canned or prepackaged foods. ? Food that is high in trans fat, such as fried foods. ? Food that is high in saturated fat, such as fatty meat. ? Sweets, desserts, sugary drinks, and other foods with added sugar. ? Full-fat dairy products.  Do not salt foods before eating.  Try to eat at least 2 vegetarian meals each week.  Eat more home-cooked food and less restaurant, buffet, and fast food.  When eating at a restaurant, ask that your food be prepared with less salt or no salt,  if possible. What foods are recommended? The items listed may not be a complete list. Talk with your dietitian about what dietary choices are best for you. Grains Whole-grain or whole-wheat bread. Whole-grain or whole-wheat pasta. Brown rice. [place]. Bulgur. Whole-grain and low-sodium cereals. Pita bread. Low-fat, low-sodium crackers. Whole-wheat flour tortillas. Vegetables Fresh or frozen vegetables (raw, steamed, roasted, or grilled). Low-sodium or reduced-sodium tomato and vegetable juice. Low-sodium or reduced-sodium tomato sauce and tomato paste. Low-sodium or reduced-sodium  canned vegetables. Fruits All fresh, dried, or frozen fruit. Canned fruit in natural juice (without added sugar). Meat and other protein foods Skinless chicken or [place]. Ground chicken or [place]. Pork with fat trimmed off. Fish and seafood. Egg whites. Dried beans, peas, or lentils. Unsalted nuts, nut butters, and seeds. Unsalted canned beans. Lean cuts of beef with fat trimmed off. Low-sodium, lean deli meat. Dairy Low-fat (1%) or fat-free (skim) milk. Fat-free, low-fat, or reduced-fat cheeses. Nonfat, low-sodium ricotta or cottage cheese. Low-fat or nonfat yogurt. Low-fat, low-sodium cheese. Fats and oils Soft margarine without trans fats. Vegetable oil. Low-fat, reduced-fat, or light mayonnaise and salad dressings (reduced-sodium). Canola, safflower, olive, soybean, and sunflower oils. Avocado. Seasoning and other foods Herbs. Spices. Seasoning mixes without salt. Unsalted popcorn and pretzels. Fat-free sweets. What foods are not recommended? The items listed may not be a complete list. Talk with your dietitian about what dietary choices are best for you. Grains Baked goods made with fat, such as croissants, muffins, or some breads. Dry pasta or rice meal packs. Vegetables Creamed or fried vegetables. Vegetables in a cheese sauce. Regular canned vegetables (not low-sodium or reduced-sodium). Regular canned tomato sauce and paste (not low-sodium or reduced-sodium). Regular tomato and vegetable juice (not low-sodium or reduced-sodium). Angie Fava. Olives. Fruits Canned fruit in a light or heavy syrup. Fried fruit. Fruit in cream or butter sauce. Meat and other protein foods Fatty cuts of meat. Ribs. Fried meat. Berniece Salines. Sausage. Bologna and other processed lunch meats. Salami. Fatback. Hotdogs. Bratwurst. Salted nuts and seeds. Canned beans with added salt. Canned or smoked fish. Whole eggs or egg yolks. Chicken or [place] with skin. Dairy Whole or 2% milk, cream, and half-and-half. Whole or  full-fat cream cheese. Whole-fat or sweetened yogurt. Full-fat cheese. Nondairy creamers. Whipped toppings. Processed cheese and cheese spreads. Fats and oils Butter. Stick margarine. Lard. Shortening. Ghee. Bacon fat. Tropical oils, such as coconut, palm kernel, or palm oil. Seasoning and other foods Salted popcorn and pretzels. Onion salt, garlic salt, seasoned salt, table salt, and sea salt. Worcestershire sauce. Tartar sauce. Barbecue sauce. Teriyaki sauce. Soy sauce, including reduced-sodium. Steak sauce. Canned and packaged gravies. Fish sauce. Oyster sauce. Cocktail sauce. Horseradish that you find on the shelf. Ketchup. Mustard. Meat flavorings and tenderizers. Bouillon cubes. Hot sauce and Tabasco sauce. Premade or packaged marinades. Premade or packaged taco seasonings. Relishes. Regular salad dressings. Where to find more information:  National Heart, Lung, and [place]: https://wilson-eaton.com/  American Heart Association: www.heart.org Summary  The DASH eating plan is a healthy eating plan that has been shown to reduce high blood pressure (hypertension). It may also reduce your risk for type 2 diabetes, heart disease, and stroke.  With the DASH eating plan, you should limit salt (sodium) intake to 2,300 mg a day. If you have hypertension, you may need to reduce your sodium intake to 1,500 mg a day.  When on the DASH eating plan, aim to eat more fresh fruits and vegetables, whole grains, lean proteins, low-fat dairy, and heart-healthy fats.  Work with your health care provider or diet and nutrition specialist (dietitian) to adjust your eating plan to your individual calorie needs. This information is not intended to replace advice given to you by your health care provider. Make sure you discuss any questions you have with your health care provider. Document Released: 08/20/2011 Document Revised: 08/24/2016 Document Reviewed: 08/24/2016 Elsevier Interactive Patient Education  [employer].

## 2018-08-23 ENCOUNTER — Telehealth: Payer: Self-pay

## 2018-08-23 ENCOUNTER — Other Ambulatory Visit: Payer: Self-pay | Admitting: *Deleted

## 2018-08-23 DIAGNOSIS — B20 Human immunodeficiency virus [HIV] disease: Secondary | ICD-10-CM

## 2018-08-23 DIAGNOSIS — Z113 Encounter for screening for infections with a predominantly sexual mode of transmission: Secondary | ICD-10-CM

## 2018-08-23 MED ORDER — PRAVASTATIN SODIUM 20 MG PO TABS: 20.0000 mg | ORAL_TABLET | Freq: Every day | ORAL | 1 refills | Status: DC

## 2018-08-23 NOTE — Telephone Encounter (Signed)
-----   Message from Lauree Chandler, NP sent at 08/23/2018  8:44 AM EST ----- Cholesterol remains above goal. Would recommend low dose statin at this time to get to goal <100. If she is willing to send pravastatin 20 mg by mouth daily to pharmacy and we will need to recheck CMP and lipid (hyperlipidemia) in 3 month with follow up in office

## 2018-08-23 NOTE — Telephone Encounter (Signed)
Medication list has been updated and rx was sent to the pharmacy. Labs were ordered and appointment was scheduled.

## 2018-08-25 ENCOUNTER — Other Ambulatory Visit (HOSPITAL_COMMUNITY)
Admission: RE | Admit: 2018-08-25 | Discharge: 2018-08-25 | Disposition: A | Discharge status: Home / Self Care | Payer: 59 | Source: Ambulatory Visit | Attending: Internal Medicine | Admitting: Internal Medicine

## 2018-08-25 ENCOUNTER — Other Ambulatory Visit: Payer: Self-pay | Admitting: Internal Medicine

## 2018-08-25 ENCOUNTER — Other Ambulatory Visit: Payer: 59

## 2018-08-25 DIAGNOSIS — Z113 Encounter for screening for infections with a predominantly sexual mode of transmission: Secondary | ICD-10-CM

## 2018-08-25 DIAGNOSIS — B20 Human immunodeficiency virus [HIV] disease: Secondary | ICD-10-CM

## 2018-08-26 LAB — URINE CYTOLOGY ANCILLARY ONLY
Chlamydia: NEGATIVE
Neisseria Gonorrhea: NEGATIVE

## 2018-08-26 LAB — T-HELPER CELL (CD4) - (RCID CLINIC ONLY)
CD4 % Helper T Cell: 19 % — ABNORMAL LOW (ref 33–55)
CD4 T Cell Abs: 500 /uL (ref 400–2700)

## 2018-08-30 LAB — COMPLETE METABOLIC PANEL WITH GFR
AG Ratio: 1.4 (calc) (ref 1.0–2.5)
ALT: 16 U/L (ref 6–29)
AST: 19 U/L (ref 10–35)
Albumin: 4.2 g/dL (ref 3.6–5.1)
Alkaline phosphatase (APISO): 46 U/L (ref 33–130)
BUN: 14 mg/dL (ref 7–25)
CO2: 33 mmol/L — ABNORMAL HIGH (ref 20–32)
Calcium: 9.9 mg/dL (ref 8.6–10.4)
Chloride: 102 mmol/L (ref 98–110)
Creat: 0.95 mg/dL (ref 0.50–0.99)
GFR, Est African American: 71 mL/min/{1.73_m2} (ref >=60)
GFR, Est Non African American: 61 mL/min/{1.73_m2} (ref >=60)
Globulin: 2.9 g/dL (calc) (ref 1.9–3.7)
Glucose, Bld: 90 mg/dL (ref 65–99)
Potassium: 4.1 mmol/L (ref 3.5–5.3)
Sodium: 143 mmol/L (ref 135–146)
Total Bilirubin: 1.2 mg/dL (ref 0.2–1.2)
Total Protein: 7.1 g/dL (ref 6.1–8.1)

## 2018-08-30 LAB — HIV-1 RNA QUANT-NO REFLEX-BLD
HIV 1 RNA Quant: <20 copies/mL — AB
HIV-1 RNA Quant, Log: <1.3 Log copies/mL — AB

## 2018-08-30 LAB — CBC WITH DIFFERENTIAL/PLATELET
Absolute Monocytes: 546 cells/uL (ref 200–950)
Basophils Absolute: 78 cells/uL (ref 0–200)
Basophils Relative: 1.3 %
Eosinophils Absolute: 240 cells/uL (ref 15–500)
Eosinophils Relative: 4 %
HCT: 40.9 % (ref 35.0–45.0)
Hemoglobin: 14.2 g/dL (ref 11.7–15.5)
Lymphs Abs: 2730 cells/uL (ref 850–3900)
MCH: 31.7 pg (ref 27.0–33.0)
MCHC: 34.7 g/dL (ref 32.0–36.0)
MCV: 91.3 fL (ref 80.0–100.0)
MPV: 10.2 fL (ref 7.5–12.5)
Monocytes Relative: 9.1 %
Neutro Abs: 2406 cells/uL (ref 1500–7800)
Neutrophils Relative %: 40.1 %
Platelets: 280 10*3/uL (ref 140–400)
RBC: 4.48 10*6/uL (ref 3.80–5.10)
RDW: 13.3 % (ref 11.0–15.0)
Total Lymphocyte: 45.5 %
WBC: 6 10*3/uL (ref 3.8–10.8)

## 2018-08-30 LAB — RPR: RPR Ser Ql: NONREACTIVE

## 2018-09-09 ENCOUNTER — Telehealth: Payer: Self-pay | Admitting: *Deleted

## 2018-09-09 NOTE — Telephone Encounter (Addendum)
My pleasure!! ===View-only below this line===   ----- Message -----    From: Maria Adkins    Sent: 09/13/2018  6:34 PM EST      To: Nurse Lanice Schwab H Subject: RE: Appointment Request  Thank you so much!  Maria Adkins ----- Message ----- From: Nurse Garnett Farm Sent: 09/13/2018 12:46 PM EST To: Maria Adkins Subject: RE: Appointment Request  Great! How about 2:30 on 1/13? Maria Adkins ===View-only below this line===   ----- Message -----    From: Maria Adkins    Sent: 09/13/2018 12:25 PM EST      To: Nurse Lanice Schwab H Subject: RE: Appointment Request  Thank you Nurse Maria Adkins,  I am available on the 13th.  Please advise the time.  Thank you. Maria Adkins ----- Message ----- From: Nurse Garnett Farm Sent: 09/12/2018 10:32 AM EST To: Maria Adkins Subject: RE: Appointment Request Thank you for that information -- totally helped me figure out what to do next. You can speak with Maria Adkins or Maria Adkins (another one - from [facility]) to get this renewed.  They can meet with you the week on January 13 to get all the information and signatures on the application.  Maria Adkins said it would be better to renew this the week it expires. When are you free to come in for that renewal appointment? Maria Adkins (the other one!)   ----- Message -----    From: Maria Adkins    Sent: 09/10/2018  4:50 PM EST      To: Nurse Garnett Farm Subject: RE: Appointment Request  Thank you Nurse Maria Adkins.  I did get approved through your office on Sep 28, 2017; awarded funding from the [facility] [place].  The award was valid for 12 months from my original acceptance date.  I noticed that there has been quite  a few changes of personnel within the Infectious Disease Dept. and unfortunately, I have mislocated the business card of the young lady that helped me last year.  Whatever can be done to locate this information will be much appreciated as I cannot afford this HIV medicine on  my own.  Respectfully submitted,  Maria Adkins, Maria Adkins! I was looking in your chart for more information on the copay assistance program in which you participate.  It looks like you did not qualify for ADAP or SPAP, but were maybe using a copay card to help cover your costs of medication. Is that correct? Have you been told that your copay card is out of funds? Which pharmacy do you use? If you are on [employer], you may need to use [facility].  They have access to copay relief programs as well.  Feel free to give me a call at [phone] if you have questions. Maria Adkins ===View-only below this line===   ----- Message -----    From: Maria Adkins    Sent: 09/09/2018 10:17 AM EST      To: Patient Appointment Schedule Request Message List Subject: RE: Appointment Request  Appointment Request From: Maria Adkins  With Provider: Carlyle Basques, MD [facility] for Infectious Disease]  Preferred Date Range: 09/12/2018 - 09/16/2018  Preferred Times: Any time  Reason for visit: Request an Appointment  Comments: recertification for Co-pay relief program

## 2018-09-12 ENCOUNTER — Telehealth: Payer: Self-pay

## 2018-09-12 ENCOUNTER — Emergency Department (HOSPITAL_BASED_OUTPATIENT_CLINIC_OR_DEPARTMENT_OTHER)
Admission: EM | Admit: 2018-09-12 | Discharge: 2018-09-12 | Disposition: A | Discharge status: Home / Self Care | Payer: 59 | Attending: Emergency Medicine | Admitting: Emergency Medicine

## 2018-09-12 ENCOUNTER — Encounter (HOSPITAL_BASED_OUTPATIENT_CLINIC_OR_DEPARTMENT_OTHER): Payer: Self-pay | Admitting: *Deleted

## 2018-09-12 ENCOUNTER — Other Ambulatory Visit: Payer: Self-pay

## 2018-09-12 DIAGNOSIS — Z87891 Personal history of nicotine dependence: Secondary | ICD-10-CM | POA: Diagnosis not present

## 2018-09-12 DIAGNOSIS — Z79899 Other long term (current) drug therapy: Secondary | ICD-10-CM | POA: Diagnosis not present

## 2018-09-12 DIAGNOSIS — R112 Nausea with vomiting, unspecified: Secondary | ICD-10-CM | POA: Insufficient documentation

## 2018-09-12 DIAGNOSIS — R1013 Epigastric pain: Secondary | ICD-10-CM | POA: Insufficient documentation

## 2018-09-12 DIAGNOSIS — R197 Diarrhea, unspecified: Secondary | ICD-10-CM | POA: Diagnosis not present

## 2018-09-12 DIAGNOSIS — R509 Fever, unspecified: Secondary | ICD-10-CM | POA: Diagnosis present

## 2018-09-12 LAB — URINALYSIS, ROUTINE W REFLEX MICROSCOPIC
Glucose, UA: NEGATIVE mg/dL
Ketones, ur: NEGATIVE mg/dL
Leukocytes, UA: NEGATIVE
Nitrite: NEGATIVE
Protein, ur: NEGATIVE mg/dL
Specific Gravity, Urine: 1.025 (ref 1.005–1.030)
pH: 5.5 (ref 5.0–8.0)

## 2018-09-12 LAB — CBC
HCT: 45.4 % (ref 36.0–46.0)
Hemoglobin: 15.1 g/dL — ABNORMAL HIGH (ref 12.0–15.0)
MCH: 31.1 pg (ref 26.0–34.0)
MCHC: 33.3 g/dL (ref 30.0–36.0)
MCV: 93.6 fL (ref 80.0–100.0)
Platelets: 246 10*3/uL (ref 150–400)
RBC: 4.85 MIL/uL (ref 3.87–5.11)
RDW: 12.7 % (ref 11.5–15.5)
WBC: 12.7 10*3/uL — ABNORMAL HIGH (ref 4.0–10.5)
nRBC: 0 % (ref 0.0–0.2)

## 2018-09-12 LAB — COMPREHENSIVE METABOLIC PANEL
ALT: 22 U/L (ref 0–44)
AST: 24 U/L (ref 15–41)
Albumin: 4.4 g/dL (ref 3.5–5.0)
Alkaline Phosphatase: 45 U/L (ref 38–126)
Anion gap: 7 (ref 5–15)
BUN: 16 mg/dL (ref 8–23)
CO2: 29 mmol/L (ref 22–32)
Calcium: 9.6 mg/dL (ref 8.9–10.3)
Chloride: 102 mmol/L (ref 98–111)
Creatinine, Ser: 0.81 mg/dL (ref 0.44–1.00)
GFR calc Af Amer: >60 mL/min (ref >60)
GFR calc non Af Amer: >60 mL/min (ref >60)
Glucose, Bld: 109 mg/dL — ABNORMAL HIGH (ref 70–99)
Potassium: 4 mmol/L (ref 3.5–5.1)
Sodium: 138 mmol/L (ref 135–145)
Total Bilirubin: 1.7 mg/dL — ABNORMAL HIGH (ref 0.3–1.2)
Total Protein: 7.9 g/dL (ref 6.5–8.1)

## 2018-09-12 LAB — URINALYSIS, MICROSCOPIC (REFLEX)

## 2018-09-12 LAB — LIPASE, BLOOD: Lipase: 19 U/L (ref 11–51)

## 2018-09-12 MED ORDER — LACTATED RINGERS IV BOLUS
1000.0000 mL | Freq: Once | INTRAVENOUS | Status: AC
  Administered 2018-09-12: 1000 mL via INTRAVENOUS

## 2018-09-12 MED ORDER — ONDANSETRON HCL 4 MG/2ML IJ SOLN
4.0000 mg | Freq: Once | INTRAMUSCULAR | Status: AC
  Administered 2018-09-12: 4 mg via INTRAVENOUS
  Filled 2018-09-12: qty 2

## 2018-09-12 MED ORDER — DICYCLOMINE HCL 20 MG PO TABS: 20.0000 mg | ORAL_TABLET | Freq: Three times a day (TID) | ORAL | 0 refills | Status: DC | PRN

## 2018-09-12 MED ORDER — DICYCLOMINE HCL 10 MG PO CAPS
10.0000 mg | ORAL_CAPSULE | Freq: Once | ORAL | Status: AC
  Administered 2018-09-12: 10 mg via ORAL
  Filled 2018-09-12: qty 1

## 2018-09-12 MED ORDER — ONDANSETRON 4 MG PO TBDP: 4.0000 mg | ORAL_TABLET | Freq: Three times a day (TID) | ORAL | 0 refills | Status: DC | PRN

## 2018-09-12 NOTE — Telephone Encounter (Signed)
Called patient back about her symptoms. She called and left message on Triage voicemail stating that she was suffering from diarrhea and could not eat. Patient was contacted but no answer. Left detailed message informing patient if symptoms got worse to go to emergency room. Also scheduled patient for an appointment to see provider here at 3 pm and told patient to call us if she could not keep this appointment.

## 2018-09-12 NOTE — ED Notes (Signed)
Pt tolerating PO challenge, wants to go home

## 2018-09-12 NOTE — ED Notes (Signed)
Pt states she is feeling much better, given ice water to try for PO challenge

## 2018-09-12 NOTE — ED Triage Notes (Signed)
Vomiting, diarrhea, abdominal pain and fever. Symptoms since this am.

## 2018-09-12 NOTE — ED Notes (Signed)
Pt verbalizes understanding of d/c instructions and denies any further needs at this time. 

## 2018-09-12 NOTE — ED Notes (Signed)
No fevers at home, pt has had n/v/d since this morning. Pt vomited once while waiting for a room. Son is concerned about her hydration and the fact that she has not had any of her HIV medications today.

## 2018-09-13 ENCOUNTER — Ambulatory Visit: Payer: Self-pay | Admitting: Adult Health

## 2018-09-13 NOTE — ED Provider Notes (Signed)
[facility] Provider Note   CSN: [id number] Arrival date & time: 09/12/18  1722     History   Chief Complaint Chief Complaint  Patient presents with  . Fever  . Diarrhea    HPI Maria Adkins is a 67 y.o. female.  67 year old female with past medical history including hypertension, asthma, HIV presents with vomiting, diarrhea, and abdominal pain.  This morning the patient began having vomiting associated with severe, intermittent crampy upper abdominal pain and nonbloody diarrhea.  She has not been able to keep anything down.  No associated fevers, URI symptoms, or urinary symptoms.  Several family members have recently been ill with similar symptoms. No Medications prior to arrival.  The history is provided by the patient.  Fever   Associated symptoms include diarrhea.  Diarrhea      Past Medical History:  Diagnosis Date  . Asthma   . High blood pressure   . HIV (human immunodeficiency virus infection) ([place])   . Osteoarthritis     Patient Active Problem List   Diagnosis Date Noted  . Seasonal allergies 01/13/2018  . Asthma 02/05/2017  . Allergy 12/16/2016  . 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 Essential hypertension 08/19/2016  . Human immunodeficiency virus (HIV) disease ([place]) 08/19/2016    Past Surgical History:  Procedure Laterality Date  . Biopsy of Liver    . CATARACT EXTRACTION, BILATERAL    . CHOLECYSTECTOMY    . CYST REMOVAL HAND Left   . LAPAROSCOPIC SALPINGOOPHERECTOMY Right      OB History   No obstetric history on file.      Home Medications    Prior to Admission medications   Medication Sig Start Date End Date Taking? Authorizing Provider  albuterol (PROVENTIL) (2.5 MG/3ML) 0.083% nebulizer solution Take 3 mLs (2.5 mg total) by nebulization every 4 (four) hours as needed for wheezing or shortness of breath. 01/11/18   [name], DO  bictegravir-emtricitabine-tenofovir AF (BIKTARVY) 50-200-25 MG TABS  tablet Take 1 tablet by mouth daily. 06/09/18   Carlyle Basques, MD  budesonide-formoterol [facility]) 160-4.5 MCG/ACT inhaler Inhale two puffs into the lungs every morning 04/14/18   [name], DO  dicyclomine (BENTYL) 20 MG tablet Take 1 tablet (20 mg total) by mouth 3 (three) times daily as needed for spasms. 09/12/18   Maze Corniel, Wenda Overland, MD  enalapril-hydrochlorothiazide (VASERETIC) 10-25 MG tablet Take 1 tablet by mouth daily. 07/26/18   Lauree Chandler, NP  Melatonin 1 MG TABS Take 5 mg by mouth at bedtime.     [provider]  metoprolol tartrate (LOPRESSOR) 100 MG tablet TAKE 1 TABLET BY MOUTH EVERY DAY 04/11/18   [name], DO  montelukast (SINGULAIR) 10 MG tablet TAKE 1 TABLET BY MOUTH EVERYDAY AT BEDTIME 08/09/18   Lauree Chandler, NP  ondansetron (ZOFRAN ODT) 4 MG disintegrating tablet Take 1 tablet (4 mg total) by mouth every 8 (eight) hours as needed for nausea or vomiting. 09/12/18   Kamylah Manzo, Wenda Overland, MD  pravastatin (PRAVACHOL) 20 MG tablet Take 1 tablet (20 mg total) by mouth daily. 08/23/18   Lauree Chandler, NP  traZODone (DESYREL) 50 MG tablet Take 0.5 tablets (25 mg total) by mouth at bedtime. 07/11/18   Lauree Chandler, NP    Family History Family History  Problem Relation Age of Onset  . Diabetes Mother        died at age 42  . Hypertension Mother   . Asthma Sister   .  Diabetes Sister   . Hypertension Sister   . Arthritis Sister   . Diabetes Sister   . Hypertension Sister   . Asthma Sister   . Arthritis Sister   . Asthma Son        controlled    Social History Social History   Tobacco Use  . Smoking status: Former Smoker    Packs/day: 1.00    Years: 40.00    Pack years: 40.00    Types: Cigarettes  . Smokeless tobacco: Never Used  Substance Use Topics  . Alcohol use: No  . Drug use: No     Allergies   Patient has no known allergies.   Review of Systems Review of Systems  Constitutional: Positive for fever.    Gastrointestinal: Positive for diarrhea.   All other systems reviewed and are negative except that which was mentioned in HPI   Physical Exam Updated Vital Signs BP 118/66 (BP Location: Left Arm)   Pulse 75   Temp 98.3 F (36.8 C) (Oral)   Resp 16   Ht 5\' 7"  (1.702 m)   Wt 82.3 kg   SpO2 97%   BMI 28.42 kg/m   Physical Exam Vitals signs and nursing note reviewed.  Constitutional:      General: She is not in acute distress.    Appearance: She is well-developed.     Comments: Uncomfortable  HENT:     Head: Normocephalic and atraumatic.     Nose: Nose normal.     Mouth/Throat:     Mouth: Mucous membranes are moist.     Pharynx: Oropharynx is clear.  Eyes:     Conjunctiva/sclera: Conjunctivae normal.  Neck:     Musculoskeletal: Neck supple.  Cardiovascular:     Rate and Rhythm: Normal rate and regular rhythm.     Heart sounds: Normal heart sounds. No murmur.  Pulmonary:     Effort: Pulmonary effort is normal.     Breath sounds: Normal breath sounds.  Abdominal:     General: Bowel sounds are normal. There is no distension.     Palpations: Abdomen is soft.     Tenderness: There is abdominal tenderness (midepigastric). There is no guarding or rebound.  Skin:    General: Skin is warm and dry.  Neurological:     Mental Status: She is alert and oriented to person, place, and time.     Comments: Fluent speech  Psychiatric:        Judgment: Judgment normal.      ED Treatments / Results  Labs (all labs ordered are listed, but only abnormal results are displayed) Labs Reviewed  COMPREHENSIVE METABOLIC PANEL - Abnormal; Notable for the following components:      Result Value   Glucose, Bld 109 (*)    Total Bilirubin 1.7 (*)    All other components within normal limits  CBC - Abnormal; Notable for the following components:   WBC 12.7 (*)    Hemoglobin 15.1 (*)    All other components within normal limits  URINALYSIS, ROUTINE W REFLEX MICROSCOPIC - Abnormal;  Notable for the following components:   Hgb urine dipstick MODERATE (*)    Bilirubin Urine SMALL (*)    All other components within normal limits  URINALYSIS, MICROSCOPIC (REFLEX) - Abnormal; Notable for the following components:   Bacteria, UA FEW (*)    All other components within normal limits  LIPASE, BLOOD    EKG EKG Interpretation  Date/Time:  Monday September 12 2018 18:06:59  EST Ventricular Rate:  81 PR Interval:  172 QRS Duration: 78 QT Interval:  382 QTC Calculation: 443 R Axis:   -51 Text Interpretation:  Normal sinus rhythm Left anterior fascicular block Septal infarct , age undetermined Abnormal ECG No significant change since last tracing Confirmed by Theotis Burrow [phone]) on 09/12/2018 8:24:47 PM   Radiology No results found.  Procedures Procedures (including critical care time)  Medications Ordered in ED Medications  lactated ringers bolus 1,000 mL (0 mLs Intravenous Stopped 09/12/18 2232)  ondansetron (ZOFRAN) injection 4 mg (4 mg Intravenous Given 09/12/18 2141)  dicyclomine (BENTYL) capsule 10 mg (10 mg Oral Given 09/12/18 2142)     Initial Impression / Assessment and Plan / ED Course  I have reviewed the triage vital signs and the nursing notes.  Pertinent labs & imaging results that were available during my care of the patient were reviewed by me and considered in my medical decision making (see chart for details).     Epigastric tenderness on exam, no lower abdominal tenderness.  Gave Zofran and IV fluid bolus as well as Bentyl.  Lab work including LFTs and lipase are reassuring.  WBC 12.7.  After above medications patient felt much better was able to drink liquids.  Her abdominal pain had resolved therefore I do not feel she needs any imaging at this time.  I have discussed supportive measures, provided Bentyl and Zofran to use at home, and reviewed return precautions.  Patient and family voiced understanding.  Final Clinical Impressions(s) / ED  Diagnoses   Final diagnoses:  Nausea vomiting and diarrhea  Epigastric abdominal pain    ED Discharge Orders         Ordered    ondansetron (ZOFRAN ODT) 4 MG disintegrating tablet  Every 8 hours PRN     09/12/18 2327    dicyclomine (BENTYL) 20 MG tablet  3 times daily PRN     09/12/18 2327           Illene Sweeting, Wenda Overland, MD 09/13/18 [phone]

## 2018-09-26 ENCOUNTER — Ambulatory Visit: Payer: 59

## 2018-10-03 ENCOUNTER — Other Ambulatory Visit: Payer: Self-pay

## 2018-10-03 MED ORDER — METOPROLOL TARTRATE 100 MG PO TABS: 100.0000 mg | ORAL_TABLET | Freq: Every day | ORAL | 1 refills | Status: DC

## 2018-10-07 ENCOUNTER — Ambulatory Visit: Payer: Medicare Other | Admitting: Nurse Practitioner

## 2018-10-12 ENCOUNTER — Encounter: Payer: Self-pay | Admitting: Internal Medicine

## 2018-10-18 ENCOUNTER — Encounter: Payer: Self-pay | Admitting: Internal Medicine

## 2018-10-24 ENCOUNTER — Encounter: Payer: Self-pay | Admitting: Internal Medicine

## 2018-10-24 ENCOUNTER — Ambulatory Visit (INDEPENDENT_AMBULATORY_CARE_PROVIDER_SITE_OTHER): Payer: 59 | Admitting: Internal Medicine

## 2018-10-24 VITALS — BP 145/84 | HR 60 | Temp 98.1°F | Ht 68.0 in | Wt 183.0 lb

## 2018-10-24 DIAGNOSIS — I10 Essential (primary) hypertension: Secondary | ICD-10-CM | POA: Diagnosis not present

## 2018-10-24 DIAGNOSIS — K5909 Other constipation: Secondary | ICD-10-CM

## 2018-10-24 DIAGNOSIS — B20 Human immunodeficiency virus [HIV] disease: Secondary | ICD-10-CM

## 2018-10-24 NOTE — Patient Instructions (Addendum)
fpr constipation, increase miralax in take (either in quantity in your water or frequency,(taking it more than once a day) to see if that helps.  Then you can also   take OTC dolculax (helps stimulate you to have bowel movement) AND colace ( stool softener)

## 2018-10-24 NOTE — Progress Notes (Signed)
RFV: follow up for hiv disease  [name], female   DOB: [birth year], 68 y.o.   MRN: [id number]  HPI Maria Adkins is 68 yo F with hiv disease, CD 4 count of 500/VL<20 on biktarvy. Doing well. Started joining a gym. Lost 7 lb in the last month  ROS- 12 point negative except for constipation  Did not take BP meds yet this morning  Outpatient Encounter Medications as of 10/24/2018  Medication Sig  . albuterol (PROVENTIL) (2.5 MG/3ML) 0.083% nebulizer solution Take 3 mLs (2.5 mg total) by nebulization every 4 (four) hours as needed for wheezing or shortness of breath.  . bictegravir-emtricitabine-tenofovir AF (BIKTARVY) 50-200-25 MG TABS tablet Take 1 tablet by mouth daily.  . budesonide-formoterol (SYMBICORT) 160-4.5 MCG/ACT inhaler Inhale two puffs into the lungs every morning  . enalapril-hydrochlorothiazide (VASERETIC) 10-25 MG tablet Take 1 tablet by mouth daily.  . metoprolol tartrate (LOPRESSOR) 100 MG tablet Take 1 tablet (100 mg total) by mouth daily.  . montelukast (SINGULAIR) 10 MG tablet TAKE 1 TABLET BY MOUTH EVERYDAY AT BEDTIME  . dicyclomine (BENTYL) 20 MG tablet Take 1 tablet (20 mg total) by mouth 3 (three) times daily as needed for spasms.  . Melatonin 1 MG TABS Take 5 mg by mouth at bedtime.   . ondansetron (ZOFRAN ODT) 4 MG disintegrating tablet Take 1 tablet (4 mg total) by mouth every 8 (eight) hours as needed for nausea or vomiting.  . pravastatin (PRAVACHOL) 20 MG tablet Take 1 tablet (20 mg total) by mouth daily. (Patient not taking: Reported on 10/24/2018)  . traZODone (DESYREL) 50 MG tablet Take 0.5 tablets (25 mg total) by mouth at bedtime.   No facility-administered encounter medications on file as of 10/24/2018.      Patient Active Problem List   Diagnosis Date Noted  . Seasonal allergies 01/13/2018  . Asthma 02/05/2017  . Allergy 12/16/2016  . 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 Essential hypertension 08/19/2016  . Human immunodeficiency  virus (HIV) disease ([place]) 08/19/2016     Health Maintenance Due  Topic Date Due  . PAP SMEAR-Modifier  12-15-48    [place]:  No smoking or drinking  Physical Exam   BP (!) 145/84   Pulse 60   Temp 98.1 F (36.7 C) (Oral)   Ht 5\' 8"  (1.727 m)   Wt 183 lb (83 kg)   BMI 27.83 kg/m   Physical Exam  Constitutional:  oriented to person, place, and time. appears well-developed and well-nourished. No distress.  HENT: [place]/AT, PERRLA, no scleral icterus Mouth/Throat: Oropharynx is clear and moist. No oropharyngeal exudate.  Cardiovascular: Normal rate, regular rhythm and normal heart sounds. Exam reveals no gallop and no friction rub.  No murmur heard.  Pulmonary/Chest: Effort normal and breath sounds normal. No respiratory distress.  has no wheezes.  Neck = supple, no nuchal rigidity Lymphadenopathy: no cervical adenopathy. No axillary adenopathy Neurological: alert and oriented to person, place, and time.  Skin: Skin is warm and dry. No rash noted. No erythema.  Psychiatric: a normal mood and affect.  behavior is normal.   Lab Results  Component Value Date   CD4TCELL 19 (L) 08/25/2018   Lab Results  Component Value Date   CD4TABS 500 08/25/2018   CD4TABS 370 (L) 06/09/2018   CD4TABS 470 12/07/2017   Lab Results  Component Value Date   HIV1RNAQUANT <20 DETECTED (A) 08/25/2018   Lab Results  Component Value Date   HEPBSAB POS (A) 09/24/2016   Lab Results  Component Value Date   LABRPR NON-REACTIVE 08/25/2018    CBC Lab Results  Component Value Date   WBC 12.7 (H) 09/12/2018   RBC 4.85 09/12/2018   HGB 15.1 (H) 09/12/2018   HCT 45.4 09/12/2018   PLT 246 09/12/2018   MCV 93.6 09/12/2018   MCH 31.1 09/12/2018   MCHC 33.3 09/12/2018   RDW 12.7 09/12/2018   LYMPHSABS 2,730 08/25/2018   MONOABS 666 02/15/2017   EOSABS 240 08/25/2018    BMET Lab Results  Component Value Date   NA 138 09/12/2018   K 4.0 09/12/2018   CL 102 09/12/2018   CO2 29 09/12/2018    GLUCOSE 109 (H) 09/12/2018   BUN 16 09/12/2018   CREATININE 0.81 09/12/2018   CALCIUM 9.6 09/12/2018   GFRNONAA >60 09/12/2018   GFRAA >60 09/12/2018      Assessment and Plan  hiv = well controlled. Continue on biktarvy  Long term medication management = cr is stable  htn = has been well controlled. Above goal this am since has not taken her meds  Constipation = recommended to increase miralax, to titrate to goal. Can also add colace and bisacodyl

## 2018-11-10 ENCOUNTER — Other Ambulatory Visit: Payer: Self-pay

## 2018-11-10 DIAGNOSIS — E782 Mixed hyperlipidemia: Secondary | ICD-10-CM

## 2018-11-14 ENCOUNTER — Other Ambulatory Visit: Payer: Medicare Other

## 2018-11-16 ENCOUNTER — Ambulatory Visit (INDEPENDENT_AMBULATORY_CARE_PROVIDER_SITE_OTHER): Payer: 59 | Admitting: Nurse Practitioner

## 2018-11-16 ENCOUNTER — Encounter: Payer: Self-pay | Admitting: Nurse Practitioner

## 2018-11-16 VITALS — BP 130/82 | HR 54 | Temp 97.8°F | Ht 67.0 in | Wt 180.0 lb

## 2018-11-16 DIAGNOSIS — J45909 Unspecified asthma, uncomplicated: Secondary | ICD-10-CM

## 2018-11-16 DIAGNOSIS — E782 Mixed hyperlipidemia: Secondary | ICD-10-CM

## 2018-11-16 DIAGNOSIS — B20 Human immunodeficiency virus [HIV] disease: Secondary | ICD-10-CM | POA: Diagnosis not present

## 2018-11-16 DIAGNOSIS — J069 Acute upper respiratory infection, unspecified: Secondary | ICD-10-CM | POA: Diagnosis not present

## 2018-11-16 DIAGNOSIS — I10 Essential (primary) hypertension: Secondary | ICD-10-CM | POA: Diagnosis not present

## 2018-11-16 NOTE — Patient Instructions (Addendum)
Make appt for fasting lab work.   Continue to work on diet and exercise- increase activity slowly after your illness Continue to increase water intake.

## 2018-11-16 NOTE — Progress Notes (Signed)
Careteam: Patient Care Team: Lauree Chandler, NP as PCP - General (Geriatric Medicine)  Advanced Directive information    No Known Allergies  Chief Complaint  Patient presents with  . Medical Management of Chronic Issues    Patient returns to the office after having a cold, dry cough an difficulty breathing. The cough is better. Never had a fever. When she is moving around quite a bit she is needing to use her rescue inhaler much more for her asthma. She has been going to the gym but hasn't been able due to her asthma.     HPI: Patient is a 68 y.o. female seen in the office today due to dry cough and difficulty breathing.  Fells like she got sick from her grandson who had a cold.  States she was more short of breath over the last few days when playing with her grandson.  6 days ago could not exercise due to the shortness of breath.  States she was wheezing but this has resolved. When she is sitting still she is fine but when she has increased activity shortness of breath returns. She was sick for about 5 days before started feeling today. Today has been her best day. Overall feeling much better.    Hyperlipidemia- has made dietary changes and increased exercise. Taking pravastatin daily- missed fasting lab work but would like to be checked today, only ate banana ~5 hours ago.    Review of Systems:  Review of Systems  Constitutional: Negative for chills, fever and malaise/fatigue.  HENT: Negative for congestion, ear pain, sinus pain and sore throat.   Respiratory: Negative for cough, shortness of breath and wheezing.        Shortness of breath with activity which has improved  Cardiovascular: Negative for chest pain and palpitations.    Past Medical History:  Diagnosis Date  . Asthma   . High blood pressure   . HIV (human immunodeficiency virus infection) ([place])   . Osteoarthritis    Past Surgical History:  Procedure Laterality Date  . Biopsy of Liver    .  CATARACT EXTRACTION, BILATERAL    . CHOLECYSTECTOMY    . CYST REMOVAL HAND Left   . LAPAROSCOPIC SALPINGOOPHERECTOMY Right    Social History:   reports that she has quit smoking. Her smoking use included cigarettes. She has a 40.00 pack-year smoking history. She has never used smokeless tobacco. She reports that she does not drink alcohol or use drugs.  Family History  Problem Relation Age of Onset  . Diabetes Mother        died at age 21  . Hypertension Mother   . Asthma Sister   . Diabetes Sister   . Hypertension Sister   . Arthritis Sister   . Diabetes Sister   . Hypertension Sister   . Asthma Sister   . Arthritis Sister   . Asthma Son        controlled    Medications: Patient's Medications  New Prescriptions   No medications on file  Previous Medications   ALBUTEROL (PROVENTIL) (2.5 MG/3ML) 0.083% NEBULIZER SOLUTION    Take 3 mLs (2.5 mg total) by nebulization every 4 (four) hours as needed for wheezing or shortness of breath.   BICTEGRAVIR-EMTRICITABINE-TENOFOVIR AF (BIKTARVY) 50-200-25 MG TABS TABLET    Take 1 tablet by mouth daily.   BUDESONIDE-FORMOTEROL (SYMBICORT) 160-4.5 MCG/ACT INHALER    Inhale two puffs into the lungs every morning   DICYCLOMINE (BENTYL) 20 MG TABLET  Take 1 tablet (20 mg total) by mouth 3 (three) times daily as needed for spasms.   ENALAPRIL-HYDROCHLOROTHIAZIDE (VASERETIC) 10-25 MG TABLET    Take 1 tablet by mouth daily.   MELATONIN 1 MG TABS    Take 5 mg by mouth at bedtime.    METOPROLOL TARTRATE (LOPRESSOR) 100 MG TABLET    Take 1 tablet (100 mg total) by mouth daily.   MONTELUKAST (SINGULAIR) 10 MG TABLET    TAKE 1 TABLET BY MOUTH EVERYDAY AT BEDTIME   ONDANSETRON (ZOFRAN ODT) 4 MG DISINTEGRATING TABLET    Take 1 tablet (4 mg total) by mouth every 8 (eight) hours as needed for nausea or vomiting.   PRAVASTATIN (PRAVACHOL) 20 MG TABLET    Take 1 tablet (20 mg total) by mouth daily.   TRAZODONE (DESYREL) 50 MG TABLET    Take 0.5 tablets (25  mg total) by mouth at bedtime.  Modified Medications   No medications on file  Discontinued Medications   No medications on file     Physical Exam:  Vitals:   11/16/18 1339  BP: (!) 142/78  Pulse: (!) 54  Temp: 97.8 F (36.6 C)  SpO2: 97%  Weight: 180 lb (81.6 kg)  Height: 5\' 7"  (1.702 m)   Body mass index is 28.19 kg/m.  Physical Exam Constitutional:      General: She is not in acute distress.    Appearance: She is well-developed. She is not diaphoretic.  HENT:     Head: Normocephalic and atraumatic.     Mouth/Throat:     Pharynx: No oropharyngeal exudate.  Eyes:     Conjunctiva/sclera: Conjunctivae normal.     Pupils: Pupils are equal, round, and reactive to light.  Neck:     Musculoskeletal: Normal range of motion and neck supple.  Cardiovascular:     Rate and Rhythm: Normal rate and regular rhythm.     Heart sounds: Normal heart sounds.  Pulmonary:     Effort: Pulmonary effort is normal.     Breath sounds: Normal breath sounds.  Abdominal:     General: Bowel sounds are normal.     Palpations: Abdomen is soft.  Musculoskeletal:        General: No tenderness.  Skin:    General: Skin is warm and dry.  Neurological:     Mental Status: She is alert and oriented to person, place, and time.  Psychiatric:        Mood and Affect: Mood normal.     Labs reviewed: Basic Metabolic Panel: Recent Labs    08/22/18 0922 08/25/18 0922 09/12/18 1819  NA 141 143 138  K 4.0 4.1 4.0  CL 100 102 102  CO2 35* 33* 29  GLUCOSE 93 90 109*  BUN 16 14 16   CREATININE 0.92 0.95 0.81  CALCIUM 9.9 9.9 9.6   Liver Function Tests: Recent Labs    08/22/18 0922 08/25/18 0922 09/12/18 1819  AST 19 19 24   ALT 15 16 22   ALKPHOS  --   --  45  BILITOT 1.3* 1.2 1.7*  PROT 6.8 7.1 7.9  ALBUMIN  --   --  4.4   Recent Labs    09/12/18 1819  LIPASE 19   No results for input(s): AMMONIA in the last 8760 hours. CBC: Recent Labs    06/09/18 1604 08/25/18 0922  09/12/18 1819  WBC 10.1 6.0 12.7*  NEUTROABS 6,403 2,406  --   HGB 14.1 14.2 15.1*  HCT 41.2 40.9 45.4  MCV 91.4 91.3 93.6  PLT 294 280 246   Lipid Panel: Recent Labs    08/22/18 0922  CHOL 207*  HDL 47*  LDLCALC 135*  TRIG 126  CHOLHDL 4.4   TSH: No results for input(s): TSH in the last 8760 hours. A1C: No results found for: HGBA1C   Assessment/Plan 1. Mixed hyperlipidemia -has made significant lifestyle changes and expects to see improvement in cholesterol. Continue on Pravachol with exercise and diet.   2. Essential hypertension Improved on recheck. Continue enalapril-hctz and lopressor.   3. Uncomplicated asthma, unspecified asthma severity, unspecified whether persistent - overall asthma as been well controlled, recently with URI however has now resolved. Continues on Symbicort and  Singulair.  4. Viral upper respiratory tract infection Has resolved, encouraged to increase activity slowly as tolerates. Continue to stay hydrated.   5. HIV Continues to follow up with ID  Next appt: as scheduled for follow up.  Carlos American. [place], [facility] [phone]

## 2018-11-17 LAB — COMPREHENSIVE METABOLIC PANEL
AG Ratio: 1.5 (calc) (ref 1.0–2.5)
ALT: 18 U/L (ref 6–29)
AST: 22 U/L (ref 10–35)
Albumin: 4.1 g/dL (ref 3.6–5.1)
Alkaline phosphatase (APISO): 53 U/L (ref 37–153)
BUN: 14 mg/dL (ref 7–25)
CO2: 35 mmol/L — ABNORMAL HIGH (ref 20–32)
Calcium: 9.8 mg/dL (ref 8.6–10.4)
Chloride: 102 mmol/L (ref 98–110)
Creat: 0.87 mg/dL (ref 0.50–0.99)
Globulin: 2.7 g/dL (calc) (ref 1.9–3.7)
Glucose, Bld: 92 mg/dL (ref 65–99)
Potassium: 3.9 mmol/L (ref 3.5–5.3)
Sodium: 141 mmol/L (ref 135–146)
Total Bilirubin: 1 mg/dL (ref 0.2–1.2)
Total Protein: 6.8 g/dL (ref 6.1–8.1)

## 2018-11-17 LAB — LIPID PANEL
Cholesterol: 138 mg/dL (ref <200)
HDL: 38 mg/dL — ABNORMAL LOW (ref >=50)
LDL Cholesterol (Calc): 77 mg/dL (calc)
Non-HDL Cholesterol (Calc): 100 mg/dL (calc) (ref <130)
Total CHOL/HDL Ratio: 3.6 (calc) (ref <5.0)
Triglycerides: 131 mg/dL (ref <150)

## 2018-11-18 ENCOUNTER — Ambulatory Visit: Payer: Self-pay | Admitting: Nurse Practitioner

## 2018-11-18 ENCOUNTER — Ambulatory Visit: Payer: Medicare Other | Admitting: Nurse Practitioner

## 2018-11-28 ENCOUNTER — Other Ambulatory Visit: Payer: 59

## 2018-12-12 ENCOUNTER — Ambulatory Visit: Payer: 59 | Admitting: Internal Medicine

## 2019-01-20 ENCOUNTER — Other Ambulatory Visit: Payer: Self-pay | Admitting: Nurse Practitioner

## 2019-01-20 DIAGNOSIS — I10 Essential (primary) hypertension: Secondary | ICD-10-CM

## 2019-01-31 ENCOUNTER — Other Ambulatory Visit: Payer: Self-pay | Admitting: Nurse Practitioner

## 2019-02-03 IMAGING — US US ABDOMEN LIMITED
1 series · 8 of 8 positions shown · non-contrast
Comparison: CT 01/13/2017.

CLINICAL DATA: Lump right umbilical area.  History of lipoma.

EXAM:
ULTRASOUND ABDOMEN LIMITED

[Series 1: us abdomen limited · 8 acquisitions, 8 frames shown]
[im 1/8]
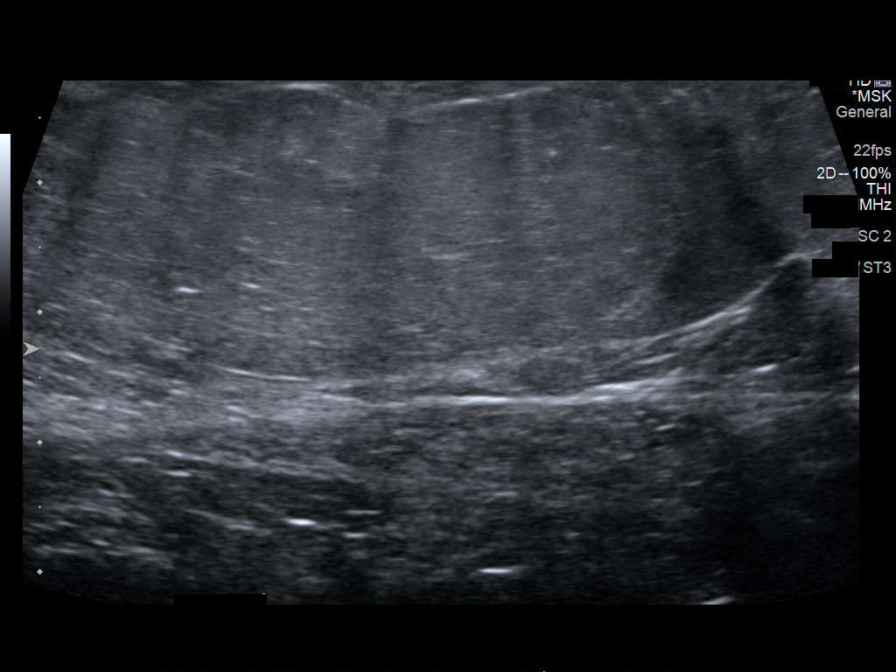
[im 2/8]
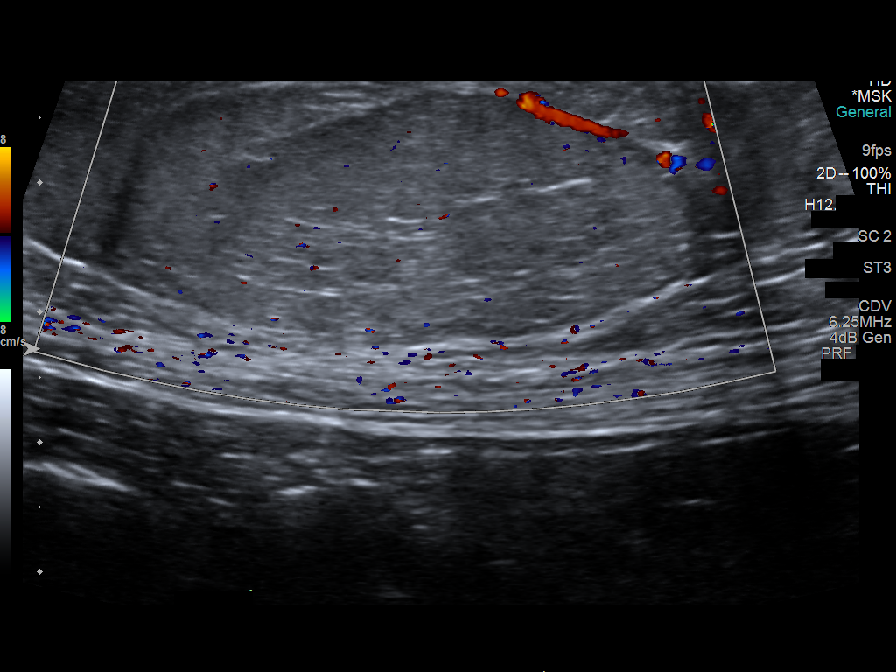
[im 3/8]
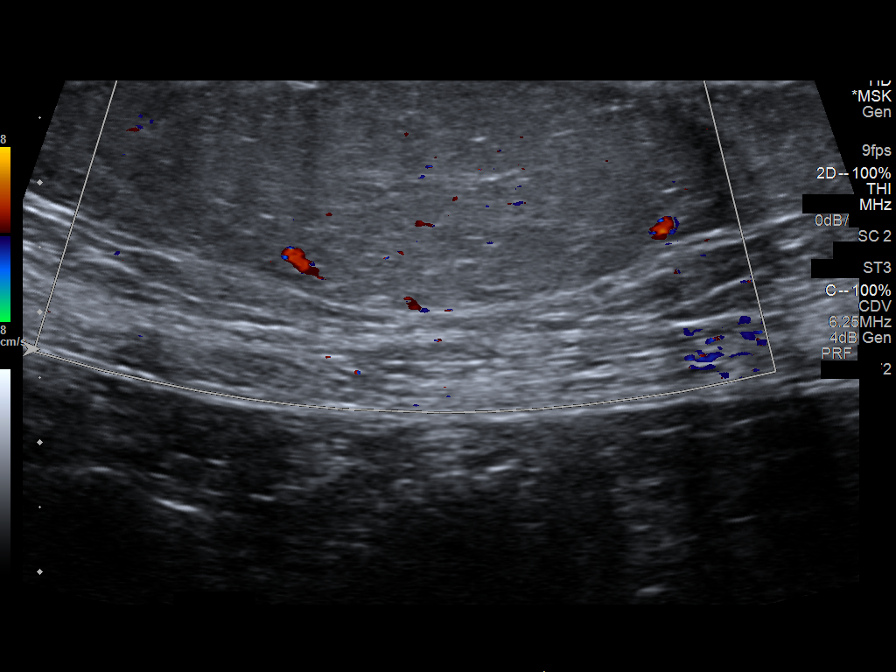
[im 4/8]
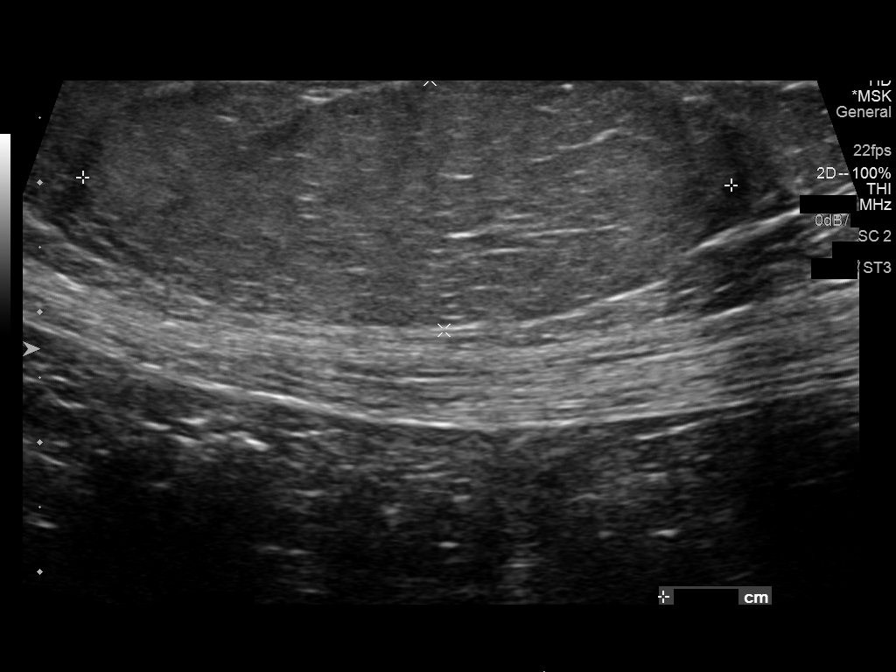
[im 5/8]
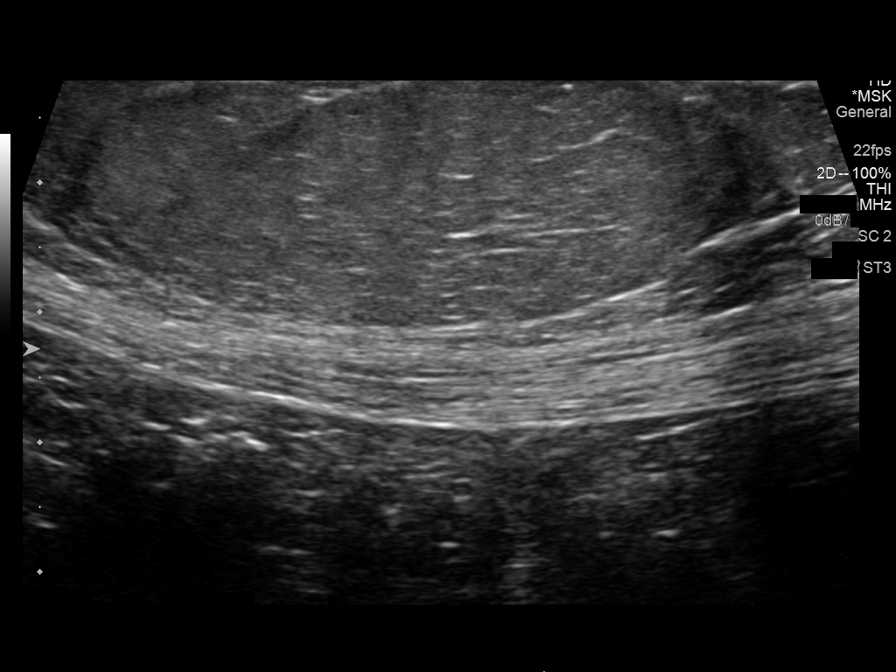
[im 6/8]
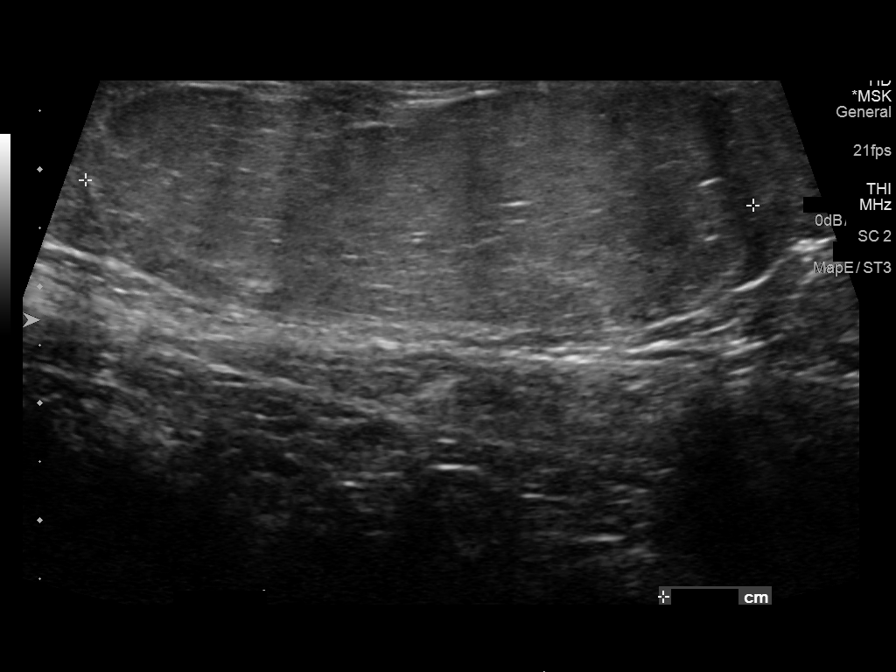
[im 7/8]
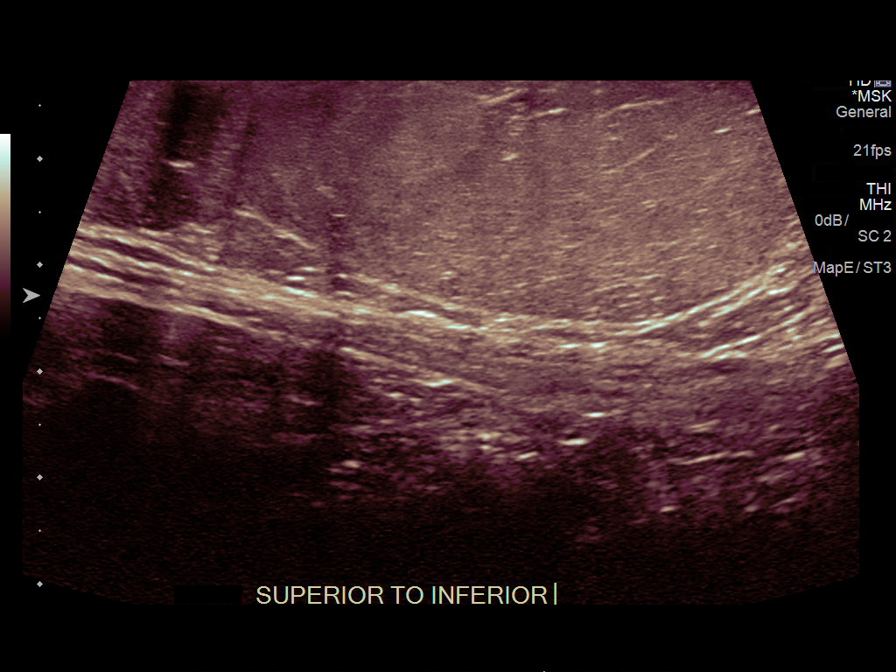
[im 8/8]
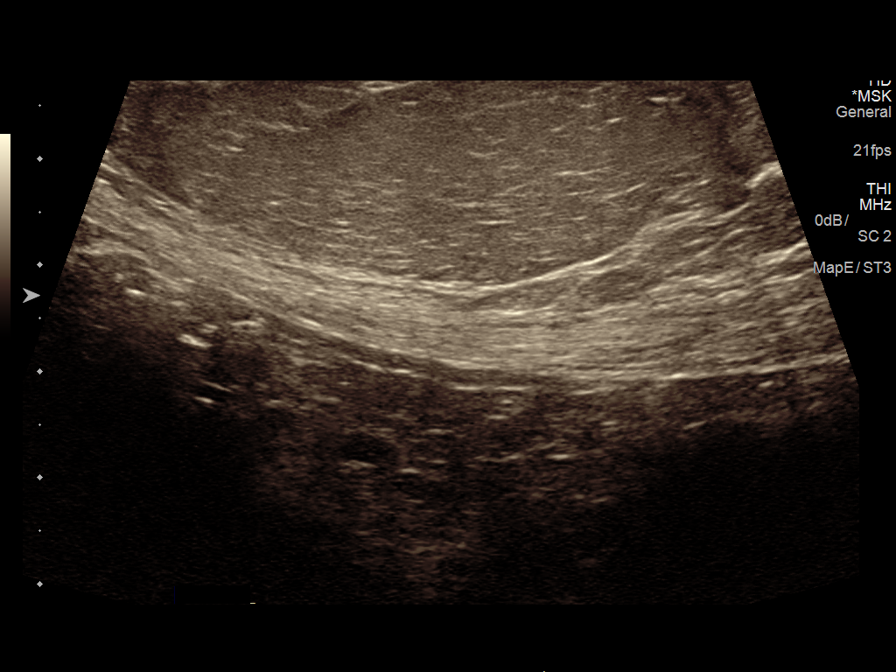

[8 of 8 positions shown; findings below may reference images not displayed]

FINDINGS: A 5.0 x 1.9 x 5.7 cm well-circumscribed slightly echogenic mass is
noted superficially in the region of palpable abnormality. This is
most consistent with a lipoma. Gadolinium-enhanced MRI can be
obtained for confirmation. No evidence of hernia noted.
IMPRESSION: Findings most consistent with a lipoma as described above .

## 2019-02-07 ENCOUNTER — Other Ambulatory Visit: Payer: Self-pay | Admitting: Nurse Practitioner

## 2019-02-14 ENCOUNTER — Other Ambulatory Visit: Payer: Self-pay | Admitting: *Deleted

## 2019-02-14 MED ORDER — PRAVASTATIN SODIUM 20 MG PO TABS: 20.0000 mg | ORAL_TABLET | Freq: Every day | ORAL | 1 refills | Status: DC

## 2019-02-14 NOTE — Telephone Encounter (Signed)
[facility] 

## 2019-02-17 ENCOUNTER — Other Ambulatory Visit: Payer: Self-pay | Admitting: Nurse Practitioner

## 2019-02-17 DIAGNOSIS — G4700 Insomnia, unspecified: Secondary | ICD-10-CM

## 2019-02-21 ENCOUNTER — Other Ambulatory Visit: Payer: Self-pay

## 2019-02-21 ENCOUNTER — Ambulatory Visit (INDEPENDENT_AMBULATORY_CARE_PROVIDER_SITE_OTHER): Payer: 59 | Admitting: Nurse Practitioner

## 2019-02-21 ENCOUNTER — Encounter: Payer: Self-pay | Admitting: Nurse Practitioner

## 2019-02-21 DIAGNOSIS — J302 Other seasonal allergic rhinitis: Secondary | ICD-10-CM

## 2019-02-21 DIAGNOSIS — G4700 Insomnia, unspecified: Secondary | ICD-10-CM

## 2019-02-21 DIAGNOSIS — B20 Human immunodeficiency virus [HIV] disease: Secondary | ICD-10-CM

## 2019-02-21 DIAGNOSIS — J45909 Unspecified asthma, uncomplicated: Secondary | ICD-10-CM | POA: Diagnosis not present

## 2019-02-21 DIAGNOSIS — E782 Mixed hyperlipidemia: Secondary | ICD-10-CM | POA: Diagnosis not present

## 2019-02-21 DIAGNOSIS — I10 Essential (primary) hypertension: Secondary | ICD-10-CM | POA: Diagnosis not present

## 2019-02-21 DIAGNOSIS — E2839 Other primary ovarian failure: Secondary | ICD-10-CM

## 2019-02-21 NOTE — Progress Notes (Signed)
This service is provided via telemedicine  No vital signs collected/recorded due to the encounter was a telemedicine visit.   Location of patient (ex: home, work): Home  Patient consents to a telephone visit: Yes  Location of the provider (ex: office, home):  [facility], Office   Name of any referring provider:  N/A  Names of all persons participating in the telemedicine service and their role in the encounter:  S.Chrae B/CMA, Sherrie Mustache, NP, and Patient   Time spent on call: 9 min with medical assistant     Careteam: Patient Care Team: Lauree Chandler, NP as PCP - General (Geriatric Medicine)  Advanced Directive information Does Patient Have a Medical Advance Directive?: No, Would patient like information on creating a medical advance directive?: No - Patient declined  No Known Allergies  Chief Complaint  Patient presents with  . Medical Management of Chronic Issues    6 month follow-up. Telephone call   . Quality Metric Gaps    Discuss need for Pap     HPI: Patient is a 68 y.o. female for routine follow up. Hyperlipidemia- LDL had improved on last labs, continues on pravastatin.  Has increased her physical activity. Walking 4-5 miles a day and playing table tennis. Eating a lot of fruits and vegetables.   Hypertension- 142/84 this morning (which is high- just took BP MEDs, generally bp does well. does not routinely take blood pressure at home. Takes metoprolol 100 mg BID, enalapril-hctz 10-25 mg daily  Asthma- has been doing well. Has URI in march but got over and has been doing well. Continues on symbicort and singular. Uses albuterol PRN  HIV- continues on BIKTARVY and followed by ID  Insomnia- current NOT an issue, uses trazodone PRN.   Osteopenia- walking daily, taking cal and vit d, due for DEXA 05/2019  Review of Systems:  Review of Systems  Constitutional: Negative for chills, fever and weight loss.  HENT: Negative for tinnitus.    Respiratory: Negative for cough, sputum production and shortness of breath.   Cardiovascular: Negative for chest pain, palpitations and leg swelling.  Gastrointestinal: Negative for abdominal pain, constipation, diarrhea and heartburn.  Genitourinary: Negative for dysuria, frequency and urgency.  Musculoskeletal: Negative for back pain, falls, joint pain and myalgias.  Skin: Negative.   Neurological: Negative for dizziness and headaches.  Psychiatric/Behavioral: Negative for depression and memory loss. The patient does not have insomnia.     Past Medical History:  Diagnosis Date  . Asthma   . High blood pressure   . HIV (human immunodeficiency virus infection) ([place])   . Osteoarthritis    Past Surgical History:  Procedure Laterality Date  . Biopsy of Liver    . CATARACT EXTRACTION, BILATERAL    . CHOLECYSTECTOMY    . CYST REMOVAL HAND Left   . LAPAROSCOPIC SALPINGOOPHERECTOMY Right    Social History:   reports that she quit smoking about 40 years ago. Her smoking use included cigarettes. She has a 40.00 pack-year smoking history. She has never used smokeless tobacco. She reports that she does not drink alcohol or use drugs.  Family History  Problem Relation Age of Onset  . Diabetes Mother        died at age 39  . Hypertension Mother   . Asthma Sister   . Diabetes Sister   . Hypertension Sister   . Arthritis Sister   . Diabetes Sister   . Hypertension Sister   . Asthma Sister   .  Arthritis Sister   . Asthma Son        controlled    Medications: Patient's Medications  New Prescriptions   No medications on file  Previous Medications   ALBUTEROL (PROVENTIL) (2.5 MG/3ML) 0.083% NEBULIZER SOLUTION    Take 3 mLs (2.5 mg total) by nebulization every 4 (four) hours as needed for wheezing or shortness of breath.   BICTEGRAVIR-EMTRICITABINE-TENOFOVIR AF (BIKTARVY) 50-200-25 MG TABS TABLET    Take 1 tablet by mouth daily.   BUDESONIDE-FORMOTEROL (SYMBICORT) 160-4.5 MCG/ACT  INHALER    Inhale two puffs into the lungs every morning   CALCIUM CARBONATE (CALCIUM 600 PO)    Take by mouth daily.   ENALAPRIL-HYDROCHLOROTHIAZIDE (VASERETIC) 10-25 MG TABLET    TAKE 1 TABLET BY MOUTH EVERY DAY   METOPROLOL TARTRATE (LOPRESSOR) 100 MG TABLET    TAKE 1 TABLET BY MOUTH EVERY DAY   MONTELUKAST (SINGULAIR) 10 MG TABLET    TAKE 1 TABLET BY MOUTH EVERYDAY AT BEDTIME   MULTIPLE VITAMINS-MINERALS (MULTIVITAMIN GUMMIES ADULT PO)    Take 2 tablets by mouth daily. Gummy   PRAVASTATIN (PRAVACHOL) 20 MG TABLET    Take 1 tablet (20 mg total) by mouth daily.   TRAZODONE (DESYREL) 50 MG TABLET    TAKE 0.5 TABLETS (25 MG TOTAL) BY MOUTH AT BEDTIME.  Modified Medications   No medications on file  Discontinued Medications   DICYCLOMINE (BENTYL) 20 MG TABLET    Take 1 tablet (20 mg total) by mouth 3 (three) times daily as needed for spasms.   MELATONIN 1 MG TABS    Take 5 mg by mouth at bedtime.    ONDANSETRON (ZOFRAN ODT) 4 MG DISINTEGRATING TABLET    Take 1 tablet (4 mg total) by mouth every 8 (eight) hours as needed for nausea or vomiting.    Physical Exam:  There were no vitals filed for this visit. There is no height or weight on file to calculate BMI. Wt Readings from Last 3 Encounters:  11/16/18 180 lb (81.6 kg)  10/24/18 183 lb (83 kg)  09/12/18 181 lb 7 oz (82.3 kg)    Unable due to tele-visit.   Labs reviewed: Basic Metabolic Panel: Recent Labs    08/25/18 0922 09/12/18 1819 11/16/18 1424  NA 143 138 141  K 4.1 4.0 3.9  CL 102 102 102  CO2 33* 29 35*  GLUCOSE 90 109* 92  BUN 14 16 14   CREATININE 0.95 0.81 0.87  CALCIUM 9.9 9.6 9.8   Liver Function Tests: Recent Labs    08/25/18 0922 09/12/18 1819 11/16/18 1424  AST 19 24 22   ALT 16 22 18   ALKPHOS  --  45  --   BILITOT 1.2 1.7* 1.0  PROT 7.1 7.9 6.8  ALBUMIN  --  4.4  --    Recent Labs    09/12/18 1819  LIPASE 19   No results for input(s): AMMONIA in the last 8760 hours. CBC: Recent Labs     06/09/18 1604 08/25/18 0922 09/12/18 1819  WBC 10.1 6.0 12.7*  NEUTROABS 6,403 2,406  --   HGB 14.1 14.2 15.1*  HCT 41.2 40.9 45.4  MCV 91.4 91.3 93.6  PLT 294 280 246   Lipid Panel: Recent Labs    08/22/18 0922 11/16/18 1424  CHOL 207* 138  HDL 47* 38*  LDLCALC 135* 77  TRIG 126 131  CHOLHDL 4.4 3.6   TSH: No results for input(s): TSH in the last 8760 hours. A1C: No results found  for: HGBA1C   Assessment/Plan 1. Seasonal allergies Stable at this time, without worsening of symptoms on singular.   2. Uncomplicated asthma, unspecified asthma severity, unspecified whether persistent Stable, continues on Symbicort and Singulair.   3. Essential hypertension Pt rechecked blood pressure later in tele-visit with home cuff which read 128/84. Blood pressure at goal on current regimen.   4. Human immunodeficiency virus (HIV) disease ([place]) Stable, continues to follow up with ID  5. Insomnia, unspecified type Doing well at this time. Encouraged to keep exercising and eating well.  Has trazodone that she uses PRN but has not needed   6. Mixed hyperlipidemia LDL improved on labs in march, continues on pravastatin with heart healthy diet.   7. Estrogen deficiency - DG Bone Density; Future  Next appt: 07/13/2019 Carlos American. Harle Battiest  [facility] [phone]    Virtual Visit via Telephone Note  I connected with pt on 02/21/19 at  8:30 AM EDT by telephone and verified that I am speaking with the correct person using two identifiers.  Location: Patient: home Provider: office   I discussed the limitations, risks, security and privacy concerns of performing an evaluation and management service by telephone and the availability of in person appointments. I also discussed with the patient that there may be a patient responsible charge related to this service. The patient expressed understanding and agreed to proceed.   I discussed the  assessment and treatment plan with the patient. The patient was provided an opportunity to ask questions and all were answered. The patient agreed with the plan and demonstrated an understanding of the instructions.   The patient was advised to call back or seek an in-person evaluation if the symptoms worsen or if the condition fails to improve as anticipated.  I provided 15 minutes of non-face-to-face time during this encounter.  Carlos American. Harle Battiest Avs printed and mailed

## 2019-03-12 ENCOUNTER — Other Ambulatory Visit: Payer: Self-pay | Admitting: Nurse Practitioner

## 2019-03-12 DIAGNOSIS — G4700 Insomnia, unspecified: Secondary | ICD-10-CM

## 2019-03-27 ENCOUNTER — Ambulatory Visit: Payer: 59 | Admitting: Internal Medicine

## 2019-04-04 ENCOUNTER — Telehealth: Payer: Self-pay | Admitting: Internal Medicine

## 2019-04-04 NOTE — Telephone Encounter (Signed)
COVID-19 Pre-Screening Questions:04/04/19   Do you currently have a fever (>100 F), chills or unexplained body aches? NO  Are you currently experiencing new cough, shortness of breath, sore throat, runny nose? NO   Have you recently travelled outside the state of [place] in the last 14 days?NO   Have you been in contact with someone that is currently pending confirmation of Covid19 testing or has been confirmed to have the [place] virus?  NO  **If the patient answers NO to ALL questions -  advise the patient to please call the clinic before coming to the office should any symptoms develop.

## 2019-04-05 ENCOUNTER — Other Ambulatory Visit: Payer: Self-pay

## 2019-04-05 ENCOUNTER — Encounter: Payer: Self-pay | Admitting: Internal Medicine

## 2019-04-05 ENCOUNTER — Ambulatory Visit (INDEPENDENT_AMBULATORY_CARE_PROVIDER_SITE_OTHER): Payer: 59 | Admitting: Internal Medicine

## 2019-04-05 VITALS — BP 150/70 | HR 56 | Temp 98.3°F | Wt 178.0 lb

## 2019-04-05 DIAGNOSIS — F341 Dysthymic disorder: Secondary | ICD-10-CM | POA: Diagnosis not present

## 2019-04-05 DIAGNOSIS — B20 Human immunodeficiency virus [HIV] disease: Secondary | ICD-10-CM | POA: Diagnosis not present

## 2019-04-05 DIAGNOSIS — I10 Essential (primary) hypertension: Secondary | ICD-10-CM | POA: Diagnosis not present

## 2019-04-05 NOTE — Progress Notes (Signed)
RFV: follow up for hiv disease  Patient ID: Cassell Smiles, female   DOB: Feb 05, 1951, 68 y.o.   MRN: [id number]  HPI Ondine is a 68yo F with well controlled hiv disease, CD 4 count of 500/VL<20 on biktarvy. Has excellent adherence. Has been keeping to herself but noticing that has worsening her depression. No thoughts of harm to self but just tearful.  Outpatient Encounter Medications as of 04/05/2019  Medication Sig  . albuterol (PROVENTIL) (2.5 MG/3ML) 0.083% nebulizer solution Take 3 mLs (2.5 mg total) by nebulization every 4 (four) hours as needed for wheezing or shortness of breath.  . bictegravir-emtricitabine-tenofovir AF (BIKTARVY) 50-200-25 MG TABS tablet Take 1 tablet by mouth daily.  . budesonide-formoterol (SYMBICORT) 160-4.5 MCG/ACT inhaler Inhale two puffs into the lungs every morning  . Calcium Carbonate (CALCIUM 600 PO) Take by mouth daily.  . enalapril-hydrochlorothiazide (VASERETIC) 10-25 MG tablet TAKE 1 TABLET BY MOUTH EVERY DAY  . metoprolol tartrate (LOPRESSOR) 100 MG tablet TAKE 1 TABLET BY MOUTH EVERY DAY  . montelukast (SINGULAIR) 10 MG tablet TAKE 1 TABLET BY MOUTH EVERYDAY AT BEDTIME  . Multiple Vitamins-Minerals (MULTIVITAMIN GUMMIES ADULT PO) Take 2 tablets by mouth daily. Gummy  . pravastatin (PRAVACHOL) 20 MG tablet Take 1 tablet (20 mg total) by mouth daily.  . traZODone (DESYREL) 50 MG tablet TAKE 0.5 TABLETS (25 MG TOTAL) BY MOUTH AT BEDTIME.   No facility-administered encounter medications on file as of 04/05/2019.      Patient Active Problem List   Diagnosis Date Noted  . Seasonal allergies 01/13/2018  . Asthma 02/05/2017  . Allergy 12/16/2016  . 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 Essential hypertension 08/19/2016  . Human immunodeficiency virus (HIV) disease ([place]) 08/19/2016   Social History   Tobacco Use  . Smoking status: Former Smoker    Packs/day: 1.00    Years: 40.00    Pack years: 40.00    Types: Cigarettes    Quit date: 09/14/1978     Years since quitting: 40.6  . Smokeless tobacco: Never Used  Substance Use Topics  . Alcohol use: No  . Drug use: No    There are no preventive care reminders to display for this patient.   Review of Systems 12 point ros is negative except depressed mood Physical Exam   BP (!) 150/70   Pulse (!) 56   Temp 98.3 F (36.8 C)   Wt 178 lb (80.7 kg)   BMI 27.88 kg/m   Physical Exam  Constitutional:  oriented to person, place, and time. appears well-developed and well-nourished. No distress.  HENT: [place]/AT, PERRLA, no scleral icterus Mouth/Throat: Oropharynx is clear and moist. No oropharyngeal exudate.  Cardiovascular: Normal rate, regular rhythm and normal heart sounds. Exam reveals no gallop and no friction rub.  No murmur heard.  Pulmonary/Chest: Effort normal and breath sounds normal. No respiratory distress.  has no wheezes.  Neck = supple, no nuchal rigidity Abdominal: Soft. Bowel sounds are normal.  exhibits no distension. There is no tenderness.  Lymphadenopathy: no cervical adenopathy. No axillary adenopathy Neurological: alert and oriented to person, place, and time.  Skin: Skin is warm and dry. No rash noted. No erythema.  Psychiatric: a normal mood and affect.  behavior is normal.   Lab Results  Component Value Date   CD4TCELL 19 (L) 08/25/2018   Lab Results  Component Value Date   CD4TABS 500 08/25/2018   CD4TABS 370 (L) 06/09/2018   CD4TABS 470 12/07/2017   Lab Results  Component Value  Date   HIV1RNAQUANT <20 DETECTED (A) 08/25/2018   Lab Results  Component Value Date   HEPBSAB POS (A) 09/24/2016   Lab Results  Component Value Date   LABRPR NON-REACTIVE 08/25/2018    CBC Lab Results  Component Value Date   WBC 12.7 (H) 09/12/2018   RBC 4.85 09/12/2018   HGB 15.1 (H) 09/12/2018   HCT 45.4 09/12/2018   PLT 246 09/12/2018   MCV 93.6 09/12/2018   MCH 31.1 09/12/2018   MCHC 33.3 09/12/2018   RDW 12.7 09/12/2018   LYMPHSABS 2,730 08/25/2018    MONOABS 666 02/15/2017   EOSABS 240 08/25/2018    BMET Lab Results  Component Value Date   NA 141 11/16/2018   K 3.9 11/16/2018   CL 102 11/16/2018   CO2 35 (H) 11/16/2018   GLUCOSE 92 11/16/2018   BUN 14 11/16/2018   CREATININE 0.87 11/16/2018   CALCIUM 9.8 11/16/2018   GFRNONAA >60 09/12/2018   GFRAA >60 09/12/2018      Assessment and Plan  hiv disease = well controlled, will check 6 month labs today. Anticipate to continue current regimne  dysthymia = offered counseling   htn = not at goal today but reports not taking all meds in the day time

## 2019-04-06 LAB — T-HELPER CELL (CD4) - (RCID CLINIC ONLY)
CD4 % Helper T Cell: 22 % — ABNORMAL LOW (ref 33–65)
CD4 T Cell Abs: 597 /uL (ref 400–1790)

## 2019-04-10 ENCOUNTER — Encounter: Payer: Self-pay | Admitting: Internal Medicine

## 2019-04-10 LAB — COMPLETE METABOLIC PANEL WITH GFR
AG Ratio: 1.6 (calc) (ref 1.0–2.5)
ALT: 32 U/L — ABNORMAL HIGH (ref 6–29)
AST: 32 U/L (ref 10–35)
Albumin: 4.1 g/dL (ref 3.6–5.1)
Alkaline phosphatase (APISO): 41 U/L (ref 37–153)
BUN/Creatinine Ratio: 13 (calc) (ref 6–22)
BUN: 13 mg/dL (ref 7–25)
CO2: 34 mmol/L — ABNORMAL HIGH (ref 20–32)
Calcium: 9.8 mg/dL (ref 8.6–10.4)
Chloride: 102 mmol/L (ref 98–110)
Creat: 1 mg/dL — ABNORMAL HIGH (ref 0.50–0.99)
GFR, Est African American: 67 mL/min/{1.73_m2} (ref >=60)
GFR, Est Non African American: 57 mL/min/{1.73_m2} — ABNORMAL LOW (ref >=60)
Globulin: 2.5 g/dL (calc) (ref 1.9–3.7)
Glucose, Bld: 95 mg/dL (ref 65–99)
Potassium: 3.6 mmol/L (ref 3.5–5.3)
Sodium: 141 mmol/L (ref 135–146)
Total Bilirubin: 1.1 mg/dL (ref 0.2–1.2)
Total Protein: 6.6 g/dL (ref 6.1–8.1)

## 2019-04-10 LAB — CBC WITH DIFFERENTIAL/PLATELET
Absolute Monocytes: 667 cells/uL (ref 200–950)
Basophils Absolute: 92 cells/uL (ref 0–200)
Basophils Relative: 1.4 %
Eosinophils Absolute: 231 cells/uL (ref 15–500)
Eosinophils Relative: 3.5 %
HCT: 41.3 % (ref 35.0–45.0)
Hemoglobin: 14 g/dL (ref 11.7–15.5)
Lymphs Abs: 2673 cells/uL (ref 850–3900)
MCH: 31.9 pg (ref 27.0–33.0)
MCHC: 33.9 g/dL (ref 32.0–36.0)
MCV: 94.1 fL (ref 80.0–100.0)
MPV: 9.9 fL (ref 7.5–12.5)
Monocytes Relative: 10.1 %
Neutro Abs: 2937 cells/uL (ref 1500–7800)
Neutrophils Relative %: 44.5 %
Platelets: 257 10*3/uL (ref 140–400)
RBC: 4.39 10*6/uL (ref 3.80–5.10)
RDW: 13.2 % (ref 11.0–15.0)
Total Lymphocyte: 40.5 %
WBC: 6.6 10*3/uL (ref 3.8–10.8)

## 2019-04-10 LAB — HIV-1 RNA QUANT-NO REFLEX-BLD
HIV 1 RNA Quant: <20 copies/mL — AB
HIV-1 RNA Quant, Log: <1.3 Log copies/mL — AB

## 2019-04-25 ENCOUNTER — Other Ambulatory Visit: Payer: Self-pay | Admitting: *Deleted

## 2019-04-25 MED ORDER — BUDESONIDE-FORMOTEROL FUMARATE 160-4.5 MCG/ACT IN AERO: INHALATION_SPRAY | RESPIRATORY_TRACT | 1 refills | Status: DC

## 2019-04-25 NOTE — Telephone Encounter (Signed)
[facility]  

## 2019-05-01 DIAGNOSIS — F329 Major depressive disorder, single episode, unspecified: Secondary | ICD-10-CM | POA: Diagnosis not present

## 2019-05-16 ENCOUNTER — Other Ambulatory Visit: Payer: Self-pay

## 2019-05-16 ENCOUNTER — Ambulatory Visit (INDEPENDENT_AMBULATORY_CARE_PROVIDER_SITE_OTHER): Payer: 59

## 2019-05-16 DIAGNOSIS — Z23 Encounter for immunization: Secondary | ICD-10-CM

## 2019-05-22 ENCOUNTER — Other Ambulatory Visit: Payer: Self-pay | Admitting: Nurse Practitioner

## 2019-05-25 DIAGNOSIS — F329 Major depressive disorder, single episode, unspecified: Secondary | ICD-10-CM | POA: Diagnosis not present

## 2019-06-07 ENCOUNTER — Telehealth: Payer: Self-pay | Admitting: Pharmacy Technician

## 2019-06-07 NOTE — Telephone Encounter (Signed)
[facility] Patient Advocate Encounter   I was successful in securing patient a $7500 grant from [employer] (PAF) to provide copayment coverage for Biktarvy.  This will make the out of pocket cost $0.     I have spoken with the patient.    The billing information is as follows and has been shared with [place].   Patient: Maria Adkins CPR_logo Fund: HIV, AIDS, and Prevention Award Period: - 06/06/2020 Cardholder: [id number] BIN: [id number] PCN: [id number] Group: [id number] For pharmacy inquiries, contact PDMI at [phone]. For patient inquiries, contact PAF at [phone].  Patient knows to call the office with questions or concerns.  Maria Adkins. Nadara Mustard Matthews Patient [facility] Phone: [phone] Fax:  [phone]

## 2019-06-15 DIAGNOSIS — F329 Major depressive disorder, single episode, unspecified: Secondary | ICD-10-CM | POA: Diagnosis not present

## 2019-06-30 ENCOUNTER — Other Ambulatory Visit: Payer: Self-pay | Admitting: Nurse Practitioner

## 2019-07-06 ENCOUNTER — Other Ambulatory Visit: Payer: Self-pay | Admitting: Internal Medicine

## 2019-07-07 DIAGNOSIS — F329 Major depressive disorder, single episode, unspecified: Secondary | ICD-10-CM | POA: Diagnosis not present

## 2019-07-13 ENCOUNTER — Ambulatory Visit: Payer: Self-pay

## 2019-07-13 ENCOUNTER — Ambulatory Visit: Payer: 59 | Admitting: Nurse Practitioner

## 2019-07-13 ENCOUNTER — Encounter: Payer: Medicare Other | Admitting: Nurse Practitioner

## 2019-07-14 ENCOUNTER — Other Ambulatory Visit: Payer: Self-pay

## 2019-07-14 ENCOUNTER — Ambulatory Visit (INDEPENDENT_AMBULATORY_CARE_PROVIDER_SITE_OTHER): Payer: 59 | Admitting: Nurse Practitioner

## 2019-07-14 ENCOUNTER — Encounter: Payer: Self-pay | Admitting: Nurse Practitioner

## 2019-07-14 DIAGNOSIS — B20 Human immunodeficiency virus [HIV] disease: Secondary | ICD-10-CM

## 2019-07-14 DIAGNOSIS — I10 Essential (primary) hypertension: Secondary | ICD-10-CM | POA: Diagnosis not present

## 2019-07-14 DIAGNOSIS — Z1231 Encounter for screening mammogram for malignant neoplasm of breast: Secondary | ICD-10-CM | POA: Diagnosis not present

## 2019-07-14 DIAGNOSIS — G4700 Insomnia, unspecified: Secondary | ICD-10-CM

## 2019-07-14 DIAGNOSIS — J45909 Unspecified asthma, uncomplicated: Secondary | ICD-10-CM | POA: Diagnosis not present

## 2019-07-14 DIAGNOSIS — M8580 Other specified disorders of bone density and structure, unspecified site: Secondary | ICD-10-CM

## 2019-07-14 DIAGNOSIS — J302 Other seasonal allergic rhinitis: Secondary | ICD-10-CM

## 2019-07-14 DIAGNOSIS — E782 Mixed hyperlipidemia: Secondary | ICD-10-CM

## 2019-07-14 DIAGNOSIS — Z0000 Encounter for general adult medical examination without abnormal findings: Secondary | ICD-10-CM

## 2019-07-14 MED ORDER — BLOOD PRESSURE CUFF MISC: 1.0000 | Freq: Every day | 0 refills | Status: AC

## 2019-07-14 NOTE — Progress Notes (Signed)
Subjective:   Maria Adkins is a 68 y.o. female who presents for Medicare Annual (Subsequent) preventive examination.  Review of Systems:   Cardiac Risk Factors include: advanced age (>50men, >61 women);hypertension     Objective:     Vitals: There were no vitals taken for this visit.  There is no height or weight on file to calculate BMI.  Advanced Directives 07/14/2019 07/14/2019 02/21/2019 09/12/2018 08/22/2018 07/11/2018 07/11/2018  Does Patient Have a Medical Advance Directive? No No No Yes Yes No No  Type of Advance Directive - - Public librarian;Living will [place];Living will - -  Does patient want to make changes to medical advance directive? - - - - - - -  Copy of [place] in Chart? - - - - No - copy requested - -  Would patient like information on creating a medical advance directive? No - Patient declined No - Patient declined No - Patient declined - - No - Patient declined -    Tobacco Social History   Tobacco Use  Smoking Status Former Smoker  . Packs/day: 1.00  . Years: 40.00  . Pack years: 40.00  . Types: Cigarettes  . Quit date: 09/14/1978  . Years since quitting: 40.8  Smokeless Tobacco Never Used     Counseling given: Not Answered   Clinical Intake:  Pre-visit preparation completed: Yes  Pain : No/denies pain     BMI - recorded: 27.88 Nutritional Status: BMI 25 -29 Overweight Nutritional Risks: None Diabetes: No  How often do you need to have someone help you when you read instructions, pamphlets, or other written materials from your doctor or pharmacy?: 2 - Rarely What is the last grade level you completed in school?: year of college  Interpreter Needed?: No     Past Medical History:  Diagnosis Date  . Asthma   . High blood pressure   . HIV (human immunodeficiency virus infection) ([place])   . Osteoarthritis    Past Surgical History:  Procedure Laterality Date  . Biopsy of Liver     . CATARACT EXTRACTION, BILATERAL    . CHOLECYSTECTOMY    . CYST REMOVAL HAND Left   . LAPAROSCOPIC SALPINGOOPHERECTOMY Right    Family History  Problem Relation Age of Onset  . Diabetes Mother        died at age 57  . Hypertension Mother   . Asthma Sister   . Diabetes Sister   . Hypertension Sister   . Arthritis Sister   . Diabetes Sister   . Hypertension Sister   . Asthma Sister   . Arthritis Sister   . Asthma Son        controlled   Social History   Socioeconomic History  . Marital status: Married    Spouse name: Not on file  . Number of children: Not on file  . Years of education: Not on file  . Highest education level: Not on file  Occupational History  . Not on file  Social Needs  . Financial resource strain: Not hard at all  . Food insecurity    Worry: Never true    Inability: Never true  . Transportation needs    Medical: No    Non-medical: No  Tobacco Use  . Smoking status: Former Smoker    Packs/day: 1.00    Years: 40.00    Pack years: 40.00    Types: Cigarettes    Quit date: 09/14/1978  Years since quitting: 40.8  . Smokeless tobacco: Never Used  Substance and Sexual Activity  . Alcohol use: No  . Drug use: No  . Sexual activity: Yes    Partners: Male    Birth control/protection: Condom    Comment: declined condoms  Lifestyle  . Physical activity    Days per week: 0 days    Minutes per session: 0 min  . Stress: To some extent  Relationships  . Social connections    Talks on phone: More than three times a week    Gets together: More than three times a week    Attends religious service: More than 4 times per year    Active member of club or organization: No    Attends meetings of clubs or organizations: Never    Relationship status: Married  Other Topics Concern  . Not on file  Social History Narrative   Diet? Regular      Do you drink/eat things with caffeine? Coffee      Marital status?  Married                                   What year were you married? March 16, 2009      Do you live in a house, apartment, assisted living, condo, trailer, etc.? House      Is it one or more stories? 1 story      How many persons live in your home? 2      Do you have any pets in your home? (please list) yes-dog      Current or past profession: Administrator      Do you exercise?  no                                    Type & how often? n/a      Do you have a living will? no      Do you have a DNR form?     no                             If not, do you want to discuss one? Yes      Do you have signed POA/HPOA for forms? no       Outpatient Encounter Medications as of 07/14/2019  Medication Sig  . albuterol (PROVENTIL) (2.5 MG/3ML) 0.083% nebulizer solution Take 3 mLs (2.5 mg total) by nebulization every 4 (four) hours as needed for wheezing or shortness of breath.  [name] albuterol (VENTOLIN HFA) 108 (90 Base) MCG/ACT inhaler INHALE 2 PUFFS BY MOUTH EVERY 4 HOURS AS NEEDED  . BIKTARVY 50-200-25 MG TABS tablet TAKE 1 TABLET BY MOUTH EVERY DAY  . budesonide-formoterol (SYMBICORT) 160-4.5 MCG/ACT inhaler Inhale two puffs into the lungs every morning  . Calcium Carbonate (CALCIUM 600 PO) Take by mouth daily.  . enalapril-hydrochlorothiazide (VASERETIC) 10-25 MG tablet TAKE 1 TABLET BY MOUTH EVERY DAY  . metoprolol tartrate (LOPRESSOR) 100 MG tablet TAKE 1 TABLET BY MOUTH EVERY DAY  . montelukast (SINGULAIR) 10 MG tablet TAKE 1 TABLET BY MOUTH EVERYDAY AT BEDTIME  . Multiple Vitamins-Minerals (MULTIVITAMIN GUMMIES ADULT PO) Take 2 tablets by mouth daily. Gummy  . pravastatin (PRAVACHOL) 20 MG tablet Take 1 tablet (20 mg total) by mouth daily.  [name]  traZODone (DESYREL) 50 MG tablet TAKE 0.5 TABLETS (25 MG TOTAL) BY MOUTH AT BEDTIME.   No facility-administered encounter medications on file as of 07/14/2019.     Activities of Daily Living In your present state of health, do you have any difficulty performing the following activities:  07/14/2019  Hearing? N  Vision? Y  Difficulty concentrating or making decisions? N  Walking or climbing stairs? Y  Comment due to OA in knees  Dressing or bathing? N  Doing errands, shopping? N  Preparing Food and eating ? N  Using the Toilet? N  In the past six months, have you accidently leaked urine? N  Do you have problems with loss of bowel control? N  Managing your Medications? N  Managing your Finances? N  Housekeeping or managing your Housekeeping? N  Some recent data might be hidden    Patient Care Team: Lauree Chandler, NP as PCP - General (Geriatric Medicine)    Assessment:   This is a routine wellness examination for Kameryn.  Exercise Activities and Dietary recommendations Current Exercise Habits: Home exercise routine, Type of exercise: walking, Time (Minutes): 60, Frequency (Times/Week): 4, Weekly Exercise (Minutes/Week): 240, Intensity: Moderate  Goals    . Increase physical activity     Starting 09/16/2016, I will attempt to increase my physical activity to 3 times per day.     . Increase physical activity     To get back to walking- 1.5 hours        Fall Risk Fall Risk  07/14/2019 07/14/2019 02/21/2019 11/16/2018 10/24/2018  Falls in the past year? 0 0 0 0 0  Number falls in past yr: 0 0 0 0 0  Injury with Fall? 0 0 0 - 0   Is the patient's home free of loose throw rugs in walkways, pet beds, electrical cords, etc?   yes      Grab bars in the bathroom? yes      Handrails on the stairs?   no stairs      Adequate lighting?   yes  Timed Get Up and Go performed: na  Depression Screen PHQ 2/9 Scores 07/14/2019 02/21/2019 10/24/2018 07/26/2018  PHQ - 2 Score 0 0 0 0  PHQ- 9 Score - - - -  Exception Documentation Other- indicate reason in comment box - - -  Not completed Patient already under the care of a counslor - - -     Cognitive Function MMSE - Mini Mental State Exam 07/11/2018 09/16/2016  Orientation to time 5 5  Orientation to Place 5 5   Registration 3 3  Attention/ Calculation 5 5  Recall 2 3  Language- name 2 objects 2 2  Language- repeat 1 1  Language- follow 3 step command 3 3  Language- read & follow direction 1 1  Write a sentence 1 1  Copy design 1 1  Total score 29 30     6CIT Screen 07/14/2019  What Year? 0 points  What month? 0 points  What time? 0 points  Count back from 20 0 points  Months in reverse 0 points  Repeat phrase 0 points  Total Score 0    Immunization History  Administered Date(s) Administered  . Fluad Quad(high Dose 65+) 05/16/2019  . Influenza,inj,Quad PF,6+ Mos 06/17/2017, 06/09/2018  . Influenza-Unspecified 06/11/2016  . Pneumococcal Conjugate-13 05/20/2018  . Pneumococcal Polysaccharide-23 02/15/2017  . Tdap 09/21/2017  . Zoster Recombinat (Shingrix) 07/19/2018    Qualifies for Shingles Vaccine?yes,up to date- had  both shingles, 2nd shingles at different pharmacy (had 3 shingrix vaccine because went so long between 1st one)  Screening Tests Health Maintenance  Topic Date Due  . MAMMOGRAM  05/12/2019  . COLONOSCOPY  11/03/2023  . TETANUS/TDAP  09/22/2027  . INFLUENZA VACCINE  Completed  . DEXA SCAN  Completed  . Hepatitis C Screening  Completed  . PNA vac Low Risk Adult  Completed    Cancer Screenings: Lung: Low Dose CT Chest recommended if Age 81-80 years, 30 pack-year currently smoking OR have quit w/in 15years. Patient does not qualify. Breast:  Up to date on Mammogram? Yes   Up to date of Bone Density/Dexa? Yes Colorectal: up to date- due 2025  Additional Screenings: : Hepatitis C Screening: done, negative.      Plan:      I have personally reviewed and noted the following in the patient's chart:   . Medical and social history . Use of alcohol, tobacco or illicit drugs  . Current medications and supplements . Functional ability and status . Nutritional status . Physical activity . Advanced directives . List of other physicians . Hospitalizations,  surgeries, and ER visits in previous 12 months . Vitals . Screenings to include cognitive, depression, and falls . Referrals and appointments  In addition, I have reviewed and discussed with patient certain preventive protocols, quality metrics, and best practice recommendations. A written personalized care plan for preventive services as well as general preventive health recommendations were provided to patient.     Lauree Chandler, NP  07/14/2019

## 2019-07-14 NOTE — Progress Notes (Signed)
   This service is provided via telemedicine  No vital signs collected/recorded due to the encounter was a telemedicine visit.   Location of patient (ex: home, work):  Home  Patient consents to a telephone visit:  Yes  Location of the provider (ex: office, home):  [facility], Office   Name of any referring provider: N/A  Names of all persons participating in the telemedicine service and their role in the encounter:  S.Chrae B/CMA, Jessica Eubanks, NP, and Patient   Time spent on call: 10 min with medical assistant   

## 2019-07-14 NOTE — Patient Instructions (Signed)
[name] , Thank you for taking time to come for your Medicare Wellness Visit. I appreciate your ongoing commitment to your health goals. Please review the following plan we discussed and let me know if I can assist you in the future.   Screening recommendations/referrals: Colonoscopy up to date, due 2025 Mammogram overdue- schedule appt- ([phone] Bone Density- overdue- schedule appt- (336) [phone] Recommended yearly ophthalmology/optometry visit for glaucoma screening and checkup Recommended yearly dental visit for hygiene and checkup  Vaccinations: Influenza vaccine - up to date Pneumococcal vaccine -up to date Tdap vaccine -up to date Shingles vaccine -up to date    Advanced directives: you have declined information   Conditions/risks identified: worsening visit- make sure to make you eye doctor appt.   Next appointment: 1 year   Preventive Care 70 Years and Older, Female Preventive care refers to lifestyle choices and visits with your health care provider that can promote health and wellness. What does preventive care include?  A yearly physical exam. This is also called an annual well check.  Dental exams once or twice a year.  Routine eye exams. Ask your health care provider how often you should have your eyes checked.  Personal lifestyle choices, including:  Daily care of your teeth and gums.  Regular physical activity.  Eating a healthy diet.  Avoiding tobacco and drug use.  Limiting alcohol use.  Practicing safe sex.  Taking low-dose aspirin every day.  Taking vitamin and mineral supplements as recommended by your health care provider. What happens during an annual well check? The services and screenings done by your health care provider during your annual well check will depend on your age, overall health, lifestyle risk factors, and family history of disease. Counseling  Your health care provider may ask you questions about your:  Alcohol use.   Tobacco use.  Drug use.  Emotional well-being.  Home and relationship well-being.  Sexual activity.  Eating habits.  History of falls.  Memory and ability to understand (cognition).  Work and work Statistician.  Reproductive health. Screening  You may have the following tests or measurements:  Height, weight, and BMI.  Blood pressure.  Lipid and cholesterol levels. These may be checked every 5 years, or more frequently if you are over 102 years old.  Skin check.  Lung cancer screening. You may have this screening every year starting at age 84 if you have a 30-pack-year history of smoking and currently smoke or have quit within the past 15 years.  Fecal occult blood test (FOBT) of the stool. You may have this test every year starting at age 70.  Flexible sigmoidoscopy or colonoscopy. You may have a sigmoidoscopy every 5 years or a colonoscopy every 10 years starting at age 80.  Hepatitis C blood test.  Hepatitis B blood test.  Sexually transmitted disease (STD) testing.  Diabetes screening. This is done by checking your blood sugar (glucose) after you have not eaten for a while (fasting). You may have this done every 1-3 years.  Bone density scan. This is done to screen for osteoporosis. You may have this done starting at age 29.  Mammogram. This may be done every 1-2 years. Talk to your health care provider about how often you should have regular mammograms. Talk with your health care provider about your test results, treatment options, and if necessary, the need for more tests. Vaccines  Your health care provider may recommend certain vaccines, such as:  Influenza vaccine. This is recommended every  year.  Tetanus, diphtheria, and acellular pertussis (Tdap, Td) vaccine. You may need a Td booster every 10 years.  Zoster vaccine. You may need this after age 71.  Pneumococcal 13-valent conjugate (PCV13) vaccine. One dose is recommended after age 65.   Pneumococcal polysaccharide (PPSV23) vaccine. One dose is recommended after age 11. Talk to your health care provider about which screenings and vaccines you need and how often you need them. This information is not intended to replace advice given to you by your health care provider. Make sure you discuss any questions you have with your health care provider. Document Released: 09/27/2015 Document Revised: 05/20/2016 Document Reviewed: 07/02/2015 Elsevier Interactive Patient Education  2017 [place] Prevention in the Home Falls can cause injuries. They can happen to people of all ages. There are many things you can do to make your home safe and to help prevent falls. What can I do on the outside of my home?  Regularly fix the edges of walkways and driveways and fix any cracks.  Remove anything that might make you trip as you walk through a door, such as a raised step or threshold.  Trim any bushes or trees on the path to your home.  Use bright outdoor lighting.  Clear any walking paths of anything that might make someone trip, such as rocks or tools.  Regularly check to see if handrails are loose or broken. Make sure that both sides of any steps have handrails.  Any raised decks and porches should have guardrails on the edges.  Have any leaves, snow, or ice cleared regularly.  Use sand or salt on walking paths during winter.  Clean up any spills in your garage right away. This includes oil or grease spills. What can I do in the bathroom?  Use night lights.  Install grab bars by the toilet and in the tub and shower. Do not use towel bars as grab bars.  Use non-skid mats or decals in the tub or shower.  If you need to sit down in the shower, use a plastic, non-slip stool.  Keep the floor dry. Clean up any water that spills on the floor as soon as it happens.  Remove soap buildup in the tub or shower regularly.  Attach bath mats securely with double-sided non-slip  rug tape.  Do not have throw rugs and other things on the floor that can make you trip. What can I do in the bedroom?  Use night lights.  Make sure that you have a light by your bed that is easy to reach.  Do not use any sheets or blankets that are too big for your bed. They should not hang down onto the floor.  Have a firm chair that has side arms. You can use this for support while you get dressed.  Do not have throw rugs and other things on the floor that can make you trip. What can I do in the kitchen?  Clean up any spills right away.  Avoid walking on wet floors.  Keep items that you use a lot in easy-to-reach places.  If you need to reach something above you, use a strong step stool that has a grab bar.  Keep electrical cords out of the way.  Do not use floor polish or wax that makes floors slippery. If you must use wax, use non-skid floor wax.  Do not have throw rugs and other things on the floor that can make you trip. What can  I do with my stairs?  Do not leave any items on the stairs.  Make sure that there are handrails on both sides of the stairs and use them. Fix handrails that are broken or loose. Make sure that handrails are as long as the stairways.  Check any carpeting to make sure that it is firmly attached to the stairs. Fix any carpet that is loose or worn.  Avoid having throw rugs at the top or bottom of the stairs. If you do have throw rugs, attach them to the floor with carpet tape.  Make sure that you have a light switch at the top of the stairs and the bottom of the stairs. If you do not have them, ask someone to add them for you. What else can I do to help prevent falls?  Wear shoes that:  Do not have high heels.  Have rubber bottoms.  Are comfortable and fit you well.  Are closed at the toe. Do not wear sandals.  If you use a stepladder:  Make sure that it is fully opened. Do not climb a closed stepladder.  Make sure that both sides of  the stepladder are locked into place.  Ask someone to hold it for you, if possible.  Clearly mark and make sure that you can see:  Any grab bars or handrails.  First and last steps.  Where the edge of each step is.  Use tools that help you move around (mobility aids) if they are needed. These include:  Canes.  Walkers.  Scooters.  Crutches.  Turn on the lights when you go into a dark area. Replace any light bulbs as soon as they burn out.  Set up your furniture so you have a clear path. Avoid moving your furniture around.  If any of your floors are uneven, fix them.  If there are any pets around you, be aware of where they are.  Review your medicines with your doctor. Some medicines can make you feel dizzy. This can increase your chance of falling. Ask your doctor what other things that you can do to help prevent falls. This information is not intended to replace advice given to you by your health care provider. Make sure you discuss any questions you have with your health care provider. Document Released: 06/27/2009 Document Revised: 02/06/2016 Document Reviewed: 10/05/2014 Elsevier Interactive Patient Education  2017 [employer].

## 2019-07-14 NOTE — Progress Notes (Signed)
This service is provided via telemedicine  No vital signs collected/recorded due to the encounter was a telemedicine visit.   Location of patient (ex: home, work):  Home   Patient consents to a telephone visit:  Yes  Location of the provider (ex: office, home):  [facility], Office   Name of any referring provider: N/A  Names of all persons participating in the telemedicine service and their role in the encounter:  S.Chrae B/CMA, Sherrie Mustache, NP, and Patient   Time spent on call: 10 min with medical assistant       Careteam: Patient Care Team: Lauree Chandler, NP as PCP - General (Geriatric Medicine)  Advanced Directive information Does Patient Have a Medical Advance Directive?: No, Would patient like information on creating a medical advance directive?: No - Patient declined  No Known Allergies  Chief Complaint  Patient presents with  . Medical Management of Chronic Issues    Routine follow-up   . Orders    Order request for B/P cuff      HPI: Patient is a 68 y.o. female via televisit  Has lost a few lbs due to walking and eating right. Son is an NP and helping her to stay healthy.   Asthma/allergies- overall has been doing very well- over the last few days with the increase rain has needed albuterol more recently. Continues on Singulair daily, uses symbicort and albuterol as needed only. Has not used inhalers all summer- uses more when weather changes.   Depression- about a month ago feeling really down, seeing a counselor. Daughter-in-law referred her to a family counselor and she is feeling much better. Continues on trazodone.   Insomnia- plans to move and sleep is off- went to bed last night at 2:30 am and got up this morning at 8:15am. Busy getting ready to move. Does not feel overly tired. Continues on trazodone.  Hyperlipidemia- continues on pravastatin. Eating better.   HIV- continues to follow up with ID, continues on biktarvy  htn- on  enalapril-hctz and metoprolol. Eating better and walking more  Gets bruises on her body after she takes her rescue inhaler-. States she did not hit her arm/leg but had bruise. Has been going on for as long as she remembers. Has been going on since she started her rescue inhaler.  Review of Systems:  Review of Systems  Constitutional: Negative for chills, fever and weight loss.  HENT: Negative for tinnitus.   Respiratory: Negative for cough, sputum production and shortness of breath.   Cardiovascular: Negative for chest pain, palpitations and leg swelling.  Gastrointestinal: Negative for abdominal pain, constipation, diarrhea and heartburn.  Genitourinary: Negative for dysuria, frequency and urgency.  Musculoskeletal: Negative for back pain, falls, joint pain and myalgias.  Skin: Negative.   Neurological: Negative for dizziness and headaches.  Endo/Heme/Allergies: Positive for environmental allergies.  Psychiatric/Behavioral: Positive for depression. Negative for memory loss. The patient has insomnia.        Improving mood with counseling     Past Medical History:  Diagnosis Date  . Asthma   . High blood pressure   . HIV (human immunodeficiency virus infection) ([place])   . Osteoarthritis    Past Surgical History:  Procedure Laterality Date  . Biopsy of Liver    . CATARACT EXTRACTION, BILATERAL    . CHOLECYSTECTOMY    . CYST REMOVAL HAND Left   . LAPAROSCOPIC SALPINGOOPHERECTOMY Right    Social History:   reports that she quit smoking about 40  years ago. Her smoking use included cigarettes. She has a 40.00 pack-year smoking history. She has never used smokeless tobacco. She reports that she does not drink alcohol or use drugs.  Family History  Problem Relation Age of Onset  . Diabetes Mother        died at age 63  . Hypertension Mother   . Asthma Sister   . Diabetes Sister   . Hypertension Sister   . Arthritis Sister   . Diabetes Sister   . Hypertension Sister   . Asthma  Sister   . Arthritis Sister   . Asthma Son        controlled    Medications: Patient's Medications  New Prescriptions   No medications on file  Previous Medications   ALBUTEROL (PROVENTIL) (2.5 MG/3ML) 0.083% NEBULIZER SOLUTION    Take 3 mLs (2.5 mg total) by nebulization every 4 (four) hours as needed for wheezing or shortness of breath.   ALBUTEROL (VENTOLIN HFA) 108 (90 BASE) MCG/ACT INHALER    INHALE 2 PUFFS BY MOUTH EVERY 4 HOURS AS NEEDED   BIKTARVY 50-200-25 MG TABS TABLET    TAKE 1 TABLET BY MOUTH EVERY DAY   BUDESONIDE-FORMOTEROL (SYMBICORT) 160-4.5 MCG/ACT INHALER    Inhale two puffs into the lungs every morning   CALCIUM CARBONATE (CALCIUM 600 PO)    Take by mouth daily.   ENALAPRIL-HYDROCHLOROTHIAZIDE (VASERETIC) 10-25 MG TABLET    TAKE 1 TABLET BY MOUTH EVERY DAY   METOPROLOL TARTRATE (LOPRESSOR) 100 MG TABLET    TAKE 1 TABLET BY MOUTH EVERY DAY   MONTELUKAST (SINGULAIR) 10 MG TABLET    TAKE 1 TABLET BY MOUTH EVERYDAY AT BEDTIME   MULTIPLE VITAMINS-MINERALS (MULTIVITAMIN GUMMIES ADULT PO)    Take 2 tablets by mouth daily. Gummy   PRAVASTATIN (PRAVACHOL) 20 MG TABLET    Take 1 tablet (20 mg total) by mouth daily.   TRAZODONE (DESYREL) 50 MG TABLET    TAKE 0.5 TABLETS (25 MG TOTAL) BY MOUTH AT BEDTIME.  Modified Medications   Modified Medication Previous Medication   BLOOD PRESSURE MONITORING (BLOOD PRESSURE CUFF) MISC Blood Pressure Monitoring (BLOOD PRESSURE CUFF) MISC      1 Device by Does not apply route daily. DX: I10    1 Device by Does not apply route daily. DX: I10  Discontinued Medications   No medications on file    Physical Exam:  There were no vitals filed for this visit. There is no height or weight on file to calculate BMI. Wt Readings from Last 3 Encounters:  04/05/19 178 lb (80.7 kg)  11/16/18 180 lb (81.6 kg)  10/24/18 183 lb (83 kg)     Labs reviewed: Basic Metabolic Panel: Recent Labs    09/12/18 1819 11/16/18 1424 04/05/19 0918  NA 138  141 141  K 4.0 3.9 3.6  CL 102 102 102  CO2 29 35* 34*  GLUCOSE 109* 92 95  BUN _0 CREATININE 0.81 0.87 1.00*  CALCIUM 9.6 9.8 9.8   Liver Function Tests: Recent Labs    09/12/18 1819 11/16/18 1424 04/05/19 0918  AST 24 22 32  ALT 22 18 32*  ALKPHOS 45  --   --   BILITOT 1.7* 1.0 1.1  PROT 7.9 6.8 6.6  ALBUMIN 4.4  --   --    Recent Labs    09/12/18 1819  LIPASE 19   No results for input(s): AMMONIA in the last 8760 hours. CBC: Recent Labs  08/25/18 0922 09/12/18 1819 04/05/19 0918  WBC 6.0 12.7* 6.6  NEUTROABS 2,406  --  2,937  HGB 14.2 15.1* 14.0  HCT 40.9 45.4 41.3  MCV 91.3 93.6 94.1  PLT 280 246 257   Lipid Panel: Recent Labs    08/22/18 0922 11/16/18 1424  CHOL 207* 138  HDL 47* 38*  LDLCALC 135* 77  TRIG 126 131  CHOLHDL 4.4 3.6   TSH: No results for input(s): TSH in the last 8760 hours. A1C: No results found for: HGBA1C   Assessment/Plan 1. Essential hypertension -well controlled on home readings. Continues dietary modification with prescribed medication.  - Blood Pressure Monitoring (BLOOD PRESSURE CUFF) MISC; 1 Device by Does not apply route daily. DX: I10  Dispense: 1 each; Refill: 0 - CBC with Differential/Platelet; Future - CMP with eGFR([facility]); Future  2. Insomnia, unspecified type Having issues with staying up late due to move but overall feeling like she is getting adequate sleep.   3. Osteopenia, unspecified location -continues on supplement, follow up Dexa scan has been ordered. Continue weight bearing activity.   4. Uncomplicated asthma, unspecified asthma severity, unspecified whether persistent -stable. Has not had any   5. Human immunodeficiency virus (HIV) disease ([place]) -continues follow up with ID  6. Seasonal allergies -worse with seasonal changes. Continues on singulair.   7. Mixed hyperlipidemia -stable on last labs, continues on statin with dietary modifications.  - Lipid panel; Future - CMP with  eGFR([facility]); Future  8. Screening mammogram, encounter for - MM DIGITAL SCREENING BILATERAL; Future  Next appt: 6 months with labs prior to appt  Earland Reish K. Harle Battiest  [facility] [phone]    Virtual Visit via Telephone Note  I connected with pt on 07/14/19 at  8:30 AM EDT by telephone and verified that I am speaking with the correct person using two identifiers.  Location: Patient: home Provider: office   I discussed the limitations, risks, security and privacy concerns of performing an evaluation and management service by telephone and the availability of in person appointments. I also discussed with the patient that there may be a patient responsible charge related to this service. The patient expressed understanding and agreed to proceed.   I discussed the assessment and treatment plan with the patient. The patient was provided an opportunity to ask questions and all were answered. The patient agreed with the plan and demonstrated an understanding of the instructions.   The patient was advised to call back or seek an in-person evaluation if the symptoms worsen or if the condition fails to improve as anticipated.  I provided 22 minutes of non-face-to-face time during this encounter.  [employer]os [employer]. Harle Battiest Avs printed and mailed

## 2019-07-15 ENCOUNTER — Other Ambulatory Visit: Payer: Self-pay | Admitting: Nurse Practitioner

## 2019-07-15 DIAGNOSIS — I10 Essential (primary) hypertension: Secondary | ICD-10-CM

## 2019-07-24 ENCOUNTER — Other Ambulatory Visit: Payer: Self-pay | Admitting: Nurse Practitioner

## 2019-07-27 ENCOUNTER — Ambulatory Visit (INDEPENDENT_AMBULATORY_CARE_PROVIDER_SITE_OTHER): Payer: 59 | Admitting: Nurse Practitioner

## 2019-07-27 ENCOUNTER — Other Ambulatory Visit: Payer: Self-pay

## 2019-07-27 ENCOUNTER — Encounter: Payer: Self-pay | Admitting: Nurse Practitioner

## 2019-07-27 DIAGNOSIS — J069 Acute upper respiratory infection, unspecified: Secondary | ICD-10-CM

## 2019-07-27 DIAGNOSIS — Z03818 Encounter for observation for suspected exposure to other biological agents ruled out: Secondary | ICD-10-CM | POA: Diagnosis not present

## 2019-07-27 DIAGNOSIS — I10 Essential (primary) hypertension: Secondary | ICD-10-CM | POA: Diagnosis not present

## 2019-07-27 DIAGNOSIS — Z20822 Contact with and (suspected) exposure to covid-19: Secondary | ICD-10-CM

## 2019-07-27 NOTE — Patient Instructions (Signed)
-   to rest, drink plenty of fluids, make sure she is eating protein protein, fruit and vegetables in diet.  -to use nasal saline to nares as needed and nasal wash daily -humidifier in her room as the air is dry -tylenol 325 mg 1-2 tablets every 6 hours as needed for sore throat/headache. -continue Multivitamin and vit C

## 2019-07-27 NOTE — Progress Notes (Signed)
This service is provided via telemedicine  No vital signs collected/recorded due to the encounter was a telemedicine visit.   Location of patient (ex: home, work):  Home  Patient consents to a telephone visit:  Yes  Location of the provider (ex: office, home): [facility]  Name of any referring provider: N/A  Names of all persons participating in the telemedicine service and their role in the encounter:  S.Chrae B/CMA, Sherrie Mustache, NP, and Patient    Time spent on call:  6 min with medical assistant      Careteam: Patient Care Team: Lauree Chandler, NP as PCP - General (Geriatric Medicine)  Advanced Directive information    No Known Allergies  Chief Complaint  Patient presents with  . Acute Visit    Sore throat and headache x 2 days. Patient used inhaler more than usual.Patient aroung grandchild with a fever (tested negative for Covid-19). Telephone visit      HPI: Patient is a 68 y.o. female seen in the office today for sore throat and headache.  Reports she has not been feeling well for 2 days.  Increase stress due to moving and staying with her son.  Yolanda Bonine- age 71  was sent home from daycare with fever and bad cold (negative for COVID).  No one else at home is sick.  Using rescue inhaler more- having to walk up and steps more (not used to this) not having any increase in shortness of breath. No chest congestion. Having a sore throat and nasal congestion. Has not taken any medication- has been gargling with warm salt water which has been beneficial.  Throat is dry. Dry cough but only occasionally.  No fever.  Blood pressure is up. 140s Eating poorly.   Review of Systems:  Review of Systems  Constitutional: Positive for malaise/fatigue. Negative for chills and fever.  HENT: Positive for congestion and sore throat. Negative for ear discharge, nosebleeds and sinus pain.   Musculoskeletal: Negative for myalgias.  Neurological: Positive for  headaches. Negative for dizziness.   Past Medical History:  Diagnosis Date  . Asthma   . High blood pressure   . HIV (human immunodeficiency virus infection) ([place])   . Osteoarthritis    Past Surgical History:  Procedure Laterality Date  . Biopsy of Liver    . CATARACT EXTRACTION, BILATERAL    . CHOLECYSTECTOMY    . CYST REMOVAL HAND Left   . LAPAROSCOPIC SALPINGOOPHERECTOMY Right    Social History:   reports that she quit smoking about 40 years ago. Her smoking use included cigarettes. She has a 40.00 pack-year smoking history. She has never used smokeless tobacco. She reports that she does not drink alcohol or use drugs.  Family History  Problem Relation Age of Onset  . Diabetes Mother        died at age 63  . Hypertension Mother   . Asthma Sister   . Diabetes Sister   . Hypertension Sister   . Arthritis Sister   . Diabetes Sister   . Hypertension Sister   . Asthma Sister   . Arthritis Sister   . Asthma Son        controlled    Medications: Patient's Medications  New Prescriptions   No medications on file  Previous Medications   ALBUTEROL (PROVENTIL) (2.5 MG/3ML) 0.083% NEBULIZER SOLUTION    Take 3 mLs (2.5 mg total) by nebulization every 4 (four) hours as needed for wheezing or shortness of breath.  ALBUTEROL (VENTOLIN HFA) 108 (90 BASE) MCG/ACT INHALER    INHALE 2 PUFFS BY MOUTH EVERY 4 HOURS AS NEEDED   BIKTARVY 50-200-25 MG TABS TABLET    TAKE 1 TABLET BY MOUTH EVERY DAY   BLOOD PRESSURE MONITORING (BLOOD PRESSURE CUFF) MISC    1 Device by Does not apply route daily. DX: I10   BUDESONIDE-FORMOTEROL (SYMBICORT) 160-4.5 MCG/ACT INHALER    Inhale two puffs into the lungs every morning   CALCIUM CARBONATE (CALCIUM 600 PO)    Take by mouth daily.   ENALAPRIL-HYDROCHLOROTHIAZIDE (VASERETIC) 10-25 MG TABLET    TAKE 1 TABLET BY MOUTH EVERY DAY   METOPROLOL TARTRATE (LOPRESSOR) 100 MG TABLET    TAKE 1 TABLET BY MOUTH EVERY DAY   MONTELUKAST (SINGULAIR) 10 MG TABLET     TAKE 1 TABLET BY MOUTH EVERYDAY AT BEDTIME   MULTIPLE VITAMINS-MINERALS (MULTIVITAMIN GUMMIES ADULT PO)    Take 2 tablets by mouth daily. Gummy   PRAVASTATIN (PRAVACHOL) 20 MG TABLET    Take 1 tablet (20 mg total) by mouth daily.   TRAZODONE (DESYREL) 50 MG TABLET    TAKE 0.5 TABLETS (25 MG TOTAL) BY MOUTH AT BEDTIME.  Modified Medications   No medications on file  Discontinued Medications   No medications on file    Physical Exam:  There were no vitals filed for this visit. There is no height or weight on file to calculate BMI. Wt Readings from Last 3 Encounters:  04/05/19 178 lb (80.7 kg)  11/16/18 180 lb (81.6 kg)  10/24/18 183 lb (83 kg)    Labs reviewed: Basic Metabolic Panel: Recent Labs    09/12/18 1819 11/16/18 1424 04/05/19 0918  NA 138 141 141  K 4.0 3.9 3.6  CL 102 102 102  CO2 29 35* 34*  GLUCOSE 109* 92 95  BUN 16 14 13   CREATININE 0.81 0.87 1.00*  CALCIUM 9.6 9.8 9.8   Liver Function Tests: Recent Labs    09/12/18 1819 11/16/18 1424 04/05/19 0918  AST 24 22 32  ALT 22 18 32*  ALKPHOS 45  --   --   BILITOT 1.7* 1.0 1.1  PROT 7.9 6.8 6.6  ALBUMIN 4.4  --   --    Recent Labs    09/12/18 1819  LIPASE 19   No results for input(s): AMMONIA in the last 8760 hours. CBC: Recent Labs    08/25/18 0922 09/12/18 1819 04/05/19 0918  WBC 6.0 12.7* 6.6  NEUTROABS 2,406  --  2,937  HGB 14.2 15.1* 14.0  HCT 40.9 45.4 41.3  MCV 91.3 93.6 94.1  PLT 280 246 257   Lipid Panel: Recent Labs    08/22/18 0922 11/16/18 1424  CHOL 207* 138  HDL 47* 38*  LDLCALC 135* 77  TRIG 126 131  CHOLHDL 4.4 3.6   TSH: No results for input(s): TSH in the last 8760 hours. A1C: No results found for: HGBA1C   Assessment/Plan 1. Viral upper respiratory tract infection -having symptoms after her grandson came home sick from daycare, he tested negative for COVID however due to respiratory symptoms will have her tested to rule out COVID -in the meantime  encouraged to rest, drink plenty of fluids, make sure she is eating protein protein, fruit and vegetables in diet.  -to use nasal saline to nares as needed and nasal wash daily -humidifier in her room as the air is dry -tylenol 325 mg 1-2 tablets every 6 hours as needed for sore throat/headache.  2.  COVID-19 ruled out - Novel Coronavirus, NAA ([facility])  3. Essential hypertension -encouraged dietary modifications and to decrease sodium as diet has been poor due to moving -continue medication as prescribed.   Return precautions discussed  Carlos American. Harle Battiest  [facility] [phone]    Virtual Visit via Telephone Note  I connected with pt on 07/27/19 at 10:00 AM EST by telephone and verified that I am speaking with the correct person using two identifiers.  Location: Patient: home Provider: [facility]    I discussed the limitations, risks, security and privacy concerns of performing an evaluation and management service by telephone and the availability of in person appointments. I also discussed with the patient that there may be a patient responsible charge related to this service. The patient expressed understanding and agreed to proceed.   I discussed the assessment and treatment plan with the patient. The patient was provided an opportunity to ask questions and all were answered. The patient agreed with the plan and demonstrated an understanding of the instructions.   The patient was advised to call back or seek an in-person evaluation if the symptoms worsen or if the condition fails to improve as anticipated.  I provided 13 minutes of non-face-to-face time during this encounter.  Carlos American. Harle Battiest Avs printed and mailed

## 2019-07-28 DIAGNOSIS — J209 Acute bronchitis, unspecified: Secondary | ICD-10-CM | POA: Diagnosis not present

## 2019-07-28 DIAGNOSIS — Z20828 Contact with and (suspected) exposure to other viral communicable diseases: Secondary | ICD-10-CM | POA: Diagnosis not present

## 2019-07-28 DIAGNOSIS — J45901 Unspecified asthma with (acute) exacerbation: Secondary | ICD-10-CM | POA: Diagnosis not present

## 2019-08-14 ENCOUNTER — Other Ambulatory Visit: Payer: Self-pay | Admitting: Nurse Practitioner

## 2019-08-14 DIAGNOSIS — I10 Essential (primary) hypertension: Secondary | ICD-10-CM

## 2019-08-29 ENCOUNTER — Other Ambulatory Visit: Payer: Self-pay | Admitting: Nurse Practitioner

## 2019-10-09 ENCOUNTER — Encounter: Payer: Self-pay | Admitting: Internal Medicine

## 2019-10-09 ENCOUNTER — Other Ambulatory Visit: Payer: Self-pay

## 2019-10-09 ENCOUNTER — Ambulatory Visit (INDEPENDENT_AMBULATORY_CARE_PROVIDER_SITE_OTHER): Payer: Medicare Other | Admitting: Internal Medicine

## 2019-10-09 VITALS — BP 132/76 | HR 75 | Wt 175.0 lb

## 2019-10-09 DIAGNOSIS — Z21 Asymptomatic human immunodeficiency virus [HIV] infection status: Secondary | ICD-10-CM

## 2019-10-09 DIAGNOSIS — Z79899 Other long term (current) drug therapy: Secondary | ICD-10-CM

## 2019-10-09 DIAGNOSIS — B20 Human immunodeficiency virus [HIV] disease: Secondary | ICD-10-CM

## 2019-10-09 DIAGNOSIS — I10 Essential (primary) hypertension: Secondary | ICD-10-CM

## 2019-10-09 DIAGNOSIS — F329 Major depressive disorder, single episode, unspecified: Secondary | ICD-10-CM | POA: Diagnosis not present

## 2019-10-09 NOTE — Progress Notes (Signed)
RFV: follow up for HIV disease - tele visit, 2 identifier for phone visit, patient at home, provider- at clinic  [name], female   DOB: [birth year], 69 y.o.   MRN: [id number]  HPI 69yo F with well controlled HIV disease, cd 4ct 596/VL <20, went to go get covid testing, living with son and grandson - has a cold (Tested negative). Had headache and cough - tested negative  Slipped on step and missed 3 steps - no broken   Outpatient Encounter Medications as of 10/09/2019  Medication Sig  . albuterol (PROVENTIL) (2.5 MG/3ML) 0.083% nebulizer solution Take 3 mLs (2.5 mg total) by nebulization every 4 (four) hours as needed for wheezing or shortness of breath.  [name] albuterol (VENTOLIN HFA) 108 (90 Base) MCG/ACT inhaler INHALE 2 PUFFS BY MOUTH EVERY 4 HOURS AS NEEDED  . BIKTARVY 50-200-25 MG TABS tablet TAKE 1 TABLET BY MOUTH EVERY DAY  . Blood Pressure Monitoring (BLOOD PRESSURE CUFF) MISC 1 Device by Does not apply route daily. DX: I10  . budesonide-formoterol (SYMBICORT) 160-4.5 MCG/ACT inhaler Inhale two puffs into the lungs every morning  . Calcium Carbonate (CALCIUM 600 PO) Take by mouth daily.  . enalapril-hydrochlorothiazide (VASERETIC) 10-25 MG tablet TAKE 1 TABLET BY MOUTH EVERY DAY  . metoprolol tartrate (LOPRESSOR) 100 MG tablet TAKE 1 TABLET BY MOUTH EVERY DAY  . montelukast (SINGULAIR) 10 MG tablet TAKE 1 TABLET BY MOUTH EVERYDAY AT BEDTIME  . Multiple Vitamins-Minerals (MULTIVITAMIN GUMMIES ADULT PO) Take 2 tablets by mouth daily. Gummy  . pravastatin (PRAVACHOL) 20 MG tablet TAKE 1 TABLET BY MOUTH EVERY DAY  . traZODone (DESYREL) 50 MG tablet TAKE 0.5 TABLETS (25 MG TOTAL) BY MOUTH AT BEDTIME.   No facility-administered encounter medications on file as of 10/09/2019.     Patient Active Problem List   Diagnosis Date Noted  . Seasonal allergies 01/13/2018  . Asthma 02/05/2017  . Allergy 12/16/2016  . 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 Essential hypertension  08/19/2016  . Human immunodeficiency virus (HIV) disease ([place]) 08/19/2016     Health Maintenance Due  Topic Date Due  . MAMMOGRAM  05/12/2019     Review of Systems Review of Systems  Constitutional: Negative for fever, chills, diaphoresis, activity change, appetite change, fatigue and unexpected weight change.  HENT: Negative for congestion, sore throat, rhinorrhea, sneezing, trouble swallowing and sinus pressure.  Eyes: Negative for photophobia and visual disturbance.  Respiratory: Negative for cough, chest tightness, shortness of breath, wheezing and stridor.  Cardiovascular: Negative for chest pain, palpitations and leg swelling.  Gastrointestinal: Negative for nausea, vomiting, abdominal pain, diarrhea, constipation, blood in stool, abdominal distention and anal bleeding.  Genitourinary: Negative for dysuria, hematuria, flank pain and difficulty urinating.  Musculoskeletal: Negative for myalgias, back pain, joint swelling, arthralgias and gait problem.  Skin: Negative for color change, pallor, rash and wound.  Neurological: Negative for dizziness, tremors, weakness and light-headedness.  Hematological: Negative for adenopathy. Does not bruise/bleed easily.  Psychiatric/Behavioral: Negative for behavioral problems, confusion, sleep disturbance, dysphoric mood, decreased concentration and agitation.    Physical Exam   BP 132/76 Comment: patient reported from home machine  Pulse 75   Wt 175 lb (79.4 kg) Comment: patient reported from home scale  BMI 27.41 kg/m   No exam Lab Results  Component Value Date   CD4TCELL 22 (L) 04/05/2019   Lab Results  Component Value Date   CD4TABS 597 04/05/2019   CD4TABS 500 08/25/2018   CD4TABS 370 (L) 06/09/2018  Lab Results  Component Value Date   HIV1RNAQUANT <20 DETECTED (A) 04/05/2019   Lab Results  Component Value Date   HEPBSAB POS (A) 09/24/2016   Lab Results  Component Value Date   LABRPR NON-REACTIVE 08/25/2018     CBC Lab Results  Component Value Date   WBC 6.6 04/05/2019   RBC 4.39 04/05/2019   HGB 14.0 04/05/2019   HCT 41.3 04/05/2019   PLT 257 04/05/2019   MCV 94.1 04/05/2019   MCH 31.9 04/05/2019   MCHC 33.9 04/05/2019   RDW 13.2 04/05/2019   LYMPHSABS 2,673 04/05/2019   MONOABS 666 02/15/2017   EOSABS 231 04/05/2019    BMET Lab Results  Component Value Date   NA 141 04/05/2019   K 3.6 04/05/2019   CL 102 04/05/2019   CO2 34 (H) 04/05/2019   GLUCOSE 95 04/05/2019   BUN 13 04/05/2019   CREATININE 1.00 (H) 04/05/2019   CALCIUM 9.8 04/05/2019   GFRNONAA 57 (L) 04/05/2019   GFRAA 67 04/05/2019      Assessment and Plan  hiv disease = do labs at next, continue biktarvy  htn = has been well controlled on single agent, continue on metoprolol  Long term medication management = cr stable in the past. Plan to continue on biktarvy  Health maintenance = planning to get covid vaccine - currently postpone

## 2019-10-11 ENCOUNTER — Ambulatory Visit: Payer: 59

## 2019-10-20 ENCOUNTER — Ambulatory Visit: Payer: 59 | Attending: Internal Medicine

## 2019-10-20 DIAGNOSIS — Z23 Encounter for immunization: Secondary | ICD-10-CM | POA: Insufficient documentation

## 2019-10-20 NOTE — Progress Notes (Signed)
   Covid-19 Vaccination Clinic  Name:  Maria Adkins    MRN: [id number] DOB: August 30, 1951  10/20/2019  [name] was observed post Covid-19 immunization for 15 minutes without incidence. She was provided with Vaccine Information Sheet and instruction to access the V-Safe system.   [name] was instructed to call 911 with any severe reactions post vaccine: [name] Difficulty breathing  . Swelling of your face and throat  . A fast heartbeat  . A bad rash all over your body  . Dizziness and weakness    Immunizations Administered    Name Date Dose VIS Date Route   Pfizer COVID-19 Vaccine 10/20/2019  1:22 PM 0.3 mL 08/25/2019 Intramuscular   Manufacturer: [place]   Lot: [id number]   [place]: [id number]

## 2019-10-27 ENCOUNTER — Other Ambulatory Visit: Payer: Self-pay | Admitting: Nurse Practitioner

## 2019-11-06 ENCOUNTER — Telehealth: Payer: Self-pay

## 2019-11-06 NOTE — Telephone Encounter (Signed)
Prior authorization faxed to [employer]. Awaiting response.   Makira Holleman Lorita Officer, RN

## 2019-11-14 ENCOUNTER — Ambulatory Visit: Payer: Medicare Other | Attending: Internal Medicine

## 2019-11-14 DIAGNOSIS — Z23 Encounter for immunization: Secondary | ICD-10-CM

## 2019-11-14 NOTE — Progress Notes (Signed)
   Covid-19 Vaccination Clinic  Name:  Trieste Wooters    MRN: [id number] DOB: 08-31-1951  11/14/2019  Ms. Goodspeed was observed post Covid-19 immunization for 15 minutes without incident. She was provided with Vaccine Information Sheet and instruction to access the V-Safe system.   Ms. [name] was instructed to call 911 with any severe reactions post vaccine: [name] Difficulty breathing  . Swelling of face and throat  . A fast heartbeat  . A bad rash all over body  . Dizziness and weakness   Immunizations Administered    Name Date Dose VIS Date Route   Pfizer COVID-19 Vaccine 11/14/2019 12:08 PM 0.3 mL 08/25/2019 Intramuscular   Manufacturer: [place]   Lot: [id number]   [place]: [id number]

## 2019-11-28 ENCOUNTER — Telehealth: Payer: Self-pay | Admitting: *Deleted

## 2019-11-28 NOTE — Telephone Encounter (Signed)
Patient notified and will call insurance company and will call back.

## 2019-11-28 NOTE — Telephone Encounter (Signed)
Can we get her to request a formulary preference from her insurance, otherwise it will just be a guessing game.

## 2019-11-28 NOTE — Telephone Encounter (Signed)
Patient called and stated that she can not afford Symbicort. Stated that it costs her $374/month. Patient is requesting it to be changed to something different. Please Advise.

## 2019-12-13 MED FILL — MONTELUKAST SOD 10 MG TAB: 10 | 90 days supply | Qty: 90 | Fill #0

## 2019-12-18 MED FILL — SYMBICORT 160-4.5 MCG INH: 160-4.5 | 60 days supply | Qty: 10 | Fill #0

## 2019-12-22 ENCOUNTER — Ambulatory Visit (INDEPENDENT_AMBULATORY_CARE_PROVIDER_SITE_OTHER): Payer: 59 | Admitting: Nurse Practitioner

## 2019-12-22 ENCOUNTER — Other Ambulatory Visit: Payer: Self-pay

## 2019-12-22 ENCOUNTER — Encounter: Payer: Self-pay | Admitting: Nurse Practitioner

## 2019-12-22 VITALS — BP 120/82 | HR 68 | Temp 96.6°F | Ht 67.0 in | Wt 172.8 lb

## 2019-12-22 DIAGNOSIS — J4540 Moderate persistent asthma, uncomplicated: Secondary | ICD-10-CM | POA: Diagnosis not present

## 2019-12-22 DIAGNOSIS — J302 Other seasonal allergic rhinitis: Secondary | ICD-10-CM

## 2019-12-22 DIAGNOSIS — H00011 Hordeolum externum right upper eyelid: Secondary | ICD-10-CM | POA: Diagnosis not present

## 2019-12-22 MED ORDER — ALBUTEROL SULFATE (2.5 MG/3ML) 0.083% IN NEBU: 2.5000 mg | INHALATION_SOLUTION | RESPIRATORY_TRACT | 6 refills | Status: AC | PRN

## 2019-12-22 MED FILL — ALBUTEROL 0.083% INHAL SOLN: (2.5 MG/3ML | 8 days supply | Qty: 150 | Fill #0

## 2019-12-22 NOTE — Patient Instructions (Signed)
Get OTC mucinex DM extended release tablet- take twice daily with a FULL glass of water   Continue warm compresses to eye, ophthalmology referral has been placed

## 2019-12-22 NOTE — Progress Notes (Signed)
Careteam: Patient Care Team: Lauree Chandler, NP as PCP - General (Geriatric Medicine)  PLACE OF SERVICE:  [place] Directive information    No Known Allergies  Chief Complaint  Patient presents with  . Acute Visit    Stye on right eye x 1 month. Patient has used warm compresses   . Medication Management    Refill nebulizer medication      HPI: Patient is a 69 y.o. female for stye on right eye.  Reports this has gone on for 1 month.  Has been using warm compresses after her eye became swollen and red. Stye was really large but has improved now.  There was blood and puss along the bottom her of eye a few weeks ago and now area is a lot smaller but not changing.  No pain, redness or drainage. Not getting any larger or smaller.  Will itch occasionally.   Grandson had a cold and now she has it. Overall feeling much better but having some chest tightness and cough. No wheezing. Using nebulizer/inhalers occasionally but now out.  Chest tightness with increase in cough. Yellow thick mucous- using mucinex DM liquid every 4 hours  Review of Systems:  Review of Systems  Constitutional: Negative for chills, fever, malaise/fatigue and weight loss.  Eyes:       Stye to right upper lid that is persistent   Respiratory: Positive for cough and sputum production. Negative for shortness of breath and wheezing.   Cardiovascular: Negative for chest pain, palpitations and leg swelling.  Skin: Positive for itching.    Past Medical History:  Diagnosis Date  . Asthma   . High blood pressure   . HIV (human immunodeficiency virus infection) ([place])   . Osteoarthritis    Past Surgical History:  Procedure Laterality Date  . Biopsy of Liver    . CATARACT EXTRACTION, BILATERAL    . CHOLECYSTECTOMY    . CYST REMOVAL HAND Left   . LAPAROSCOPIC SALPINGOOPHERECTOMY Right    Social History:   reports that she quit smoking about 41 years ago. Her smoking use included  cigarettes. She has a 40.00 pack-year smoking history. She has never used smokeless tobacco. She reports that she does not drink alcohol or use drugs.  Family History  Problem Relation Age of Onset  . Diabetes Mother        died at age 79  . Hypertension Mother   . Asthma Sister   . Diabetes Sister   . Hypertension Sister   . Arthritis Sister   . Diabetes Sister   . Hypertension Sister   . Asthma Sister   . Arthritis Sister   . Asthma Son        controlled    Medications: Patient's Medications  New Prescriptions   No medications on file  Previous Medications   ALBUTEROL (PROVENTIL) (2.5 MG/3ML) 0.083% NEBULIZER SOLUTION    Take 3 mLs (2.5 mg total) by nebulization every 4 (four) hours as needed for wheezing or shortness of breath.   ALBUTEROL (VENTOLIN HFA) 108 (90 BASE) MCG/ACT INHALER    INHALE 2 PUFFS BY MOUTH EVERY 4 HOURS AS NEEDED   BIKTARVY 50-200-25 MG TABS TABLET    TAKE 1 TABLET BY MOUTH EVERY DAY   BLOOD PRESSURE MONITORING (BLOOD PRESSURE CUFF) MISC    1 Device by Does not apply route daily. DX: I10   BUDESONIDE-FORMOTEROL (SYMBICORT) 160-4.5 MCG/ACT INHALER    INHALE TWO PUFFS INTO THE LUNGS EVERY  MORNING   CALCIUM CARBONATE (CALCIUM 600 PO)    Take by mouth daily.   ENALAPRIL-HYDROCHLOROTHIAZIDE (VASERETIC) 10-25 MG TABLET    TAKE 1 TABLET BY MOUTH EVERY DAY   METOPROLOL TARTRATE (LOPRESSOR) 100 MG TABLET    TAKE 1 TABLET BY MOUTH EVERY DAY   MONTELUKAST (SINGULAIR) 10 MG TABLET    TAKE 1 TABLET BY MOUTH EVERYDAY AT BEDTIME   MULTIPLE VITAMINS-MINERALS (MULTIVITAMIN GUMMIES ADULT PO)    Take 2 tablets by mouth daily. Gummy   PRAVASTATIN (PRAVACHOL) 20 MG TABLET    TAKE 1 TABLET BY MOUTH EVERY DAY   TRAZODONE (DESYREL) 50 MG TABLET    TAKE 0.5 TABLETS (25 MG TOTAL) BY MOUTH AT BEDTIME.   VITAMIN D PO    Take 1 capsule by mouth daily.  Modified Medications   No medications on file  Discontinued Medications   No medications on file    Physical Exam:  Vitals:     12/22/19 1009  BP: 120/82  Pulse: 68  Temp: (!) 96.6 F (35.9 C)  TempSrc: Temporal  SpO2: 96%  Weight: 172 lb 12.8 oz (78.4 kg)  Height: 5\' 7"  (1.702 m)   Body mass index is 27.06 kg/m. Wt Readings from Last 3 Encounters:  12/22/19 172 lb 12.8 oz (78.4 kg)  10/09/19 175 lb (79.4 kg)  04/05/19 178 lb (80.7 kg)    Physical Exam Constitutional:      General: She is not in acute distress.    Appearance: She is well-developed. She is not diaphoretic.  HENT:     Head: Normocephalic and atraumatic.  Eyes:     General:        Right eye: Hordeolum present.     Conjunctiva/sclera: Conjunctivae normal.     Pupils: Pupils are equal, round, and reactive to light.     Comments: Small stye noted to right upper lid, no infection, redness noted   Cardiovascular:     Rate and Rhythm: Normal rate and regular rhythm.     Heart sounds: Normal heart sounds.  Pulmonary:     Effort: Pulmonary effort is normal. No respiratory distress.     Breath sounds: Normal breath sounds. No wheezing.  Musculoskeletal:     Cervical back: Normal range of motion and neck supple.  Skin:    General: Skin is warm and dry.  Neurological:     Mental Status: She is alert and oriented to person, place, and time.     Labs reviewed: Basic Metabolic Panel: Recent Labs    04/05/19 0918  NA 141  K 3.6  CL 102  CO2 34*  GLUCOSE 95  BUN 13  CREATININE 1.00*  CALCIUM 9.8   Liver Function Tests: Recent Labs    04/05/19 0918  AST 32  ALT 32*  BILITOT 1.1  PROT 6.6   No results for input(s): LIPASE, AMYLASE in the last 8760 hours. No results for input(s): AMMONIA in the last 8760 hours. CBC: Recent Labs    04/05/19 0918  WBC 6.6  NEUTROABS 2,937  HGB 14.0  HCT 41.3  MCV 94.1  PLT 257   Lipid Panel: No results for input(s): CHOL, HDL, LDLCALC, TRIG, CHOLHDL, LDLDIRECT in the last 8760 hours. TSH: No results for input(s): TSH in the last 8760 hours. A1C: No results found for:  HGBA1C   Assessment/Plan 1. Moderate persistent asthma without complication With recent flare likely viral that appears to be improving at this time.  -refill provided today -to notify if  symptoms fail to improve or worsen - albuterol (PROVENTIL) (2.5 MG/3ML) 0.083% nebulizer solution; Take 3 mLs (2.5 mg total) by nebulization every 4 (four) hours as needed for wheezing or shortness of breath.  Dispense: 150 mL; Refill: 6  2. Seasonal allergies - albuterol (PROVENTIL) (2.5 MG/3ML) 0.083% nebulizer solution; Take 3 mLs (2.5 mg total) by nebulization every 4 (four) hours as needed for wheezing or shortness of breath.  Dispense: 150 mL; Refill: 6  3. Hordeolum externum of right upper eyelid -she has already use warm compresses without benefit and are persist. Will refer to ophthalmology for further recommendations at this time.   - Ambulatory referral to Ophthalmology   Next appt: 01/12/2020 as scheduled  Adira Limburg K. [place], [facility] [phone]

## 2019-12-25 ENCOUNTER — Telehealth: Payer: Self-pay

## 2019-12-25 ENCOUNTER — Other Ambulatory Visit: Payer: Self-pay | Admitting: Internal Medicine

## 2019-12-25 DIAGNOSIS — J209 Acute bronchitis, unspecified: Secondary | ICD-10-CM

## 2019-12-25 DIAGNOSIS — J208 Acute bronchitis due to other specified organisms: Secondary | ICD-10-CM

## 2019-12-25 MED ORDER — DOXYCYCLINE HYCLATE 100 MG PO TABS: 100.0000 mg | ORAL_TABLET | Freq: Two times a day (BID) | ORAL | 0 refills | Status: DC

## 2019-12-25 MED FILL — DOXYCYCLINE HYCLATE 100 MG: 100 | 7 days supply | Qty: 14 | Fill #0

## 2019-12-25 NOTE — Telephone Encounter (Signed)
Patient c/o cold, upper respiratory symptoms or sinus infection  1.) How long have you had your symptoms? Patient was seen last week, symptoms progressed on Friday 12/22/2019. Patient c/o chest congestion and cough that triggered asthma.  2.) Any fever, if yes did you check with a thermometer? No   3.) Any myalgias (muscle aches)? No  4.) Are you coughing up mucus, if yes is it discolored? Productive cough, thick yellow.   5.) What have you tried for symptoms? Taking Mucinex, using inhaler and nebulizer   6.) Have you been around anyone sick? Grandson   Patient states she was told to call if symptoms progressed and Janett Billow would call in an antibiotic. Pharmacy on file confirmed.  I will forward your message to your provider and call with response.

## 2019-12-25 NOTE — Telephone Encounter (Signed)
Doxycycline sent to the pharmacy, to take twice daily with food, would add probiotic as well. To take entire course. Continue MUCINEX DM tablet by mouth twice daily with full glass of water with her inhalers as needed

## 2019-12-25 NOTE — Telephone Encounter (Signed)
Discuss response with patient. Patient verbalized understanding of response and states she is taking the tablet of Mucinex.

## 2019-12-27 ENCOUNTER — Emergency Department (HOSPITAL_BASED_OUTPATIENT_CLINIC_OR_DEPARTMENT_OTHER): Payer: 59

## 2019-12-27 ENCOUNTER — Other Ambulatory Visit: Payer: Self-pay | Admitting: Pharmacist

## 2019-12-27 ENCOUNTER — Encounter (HOSPITAL_BASED_OUTPATIENT_CLINIC_OR_DEPARTMENT_OTHER): Payer: Self-pay

## 2019-12-27 ENCOUNTER — Emergency Department (HOSPITAL_BASED_OUTPATIENT_CLINIC_OR_DEPARTMENT_OTHER)
Admission: EM | Admit: 2019-12-27 | Discharge: 2019-12-28 | Disposition: A | Discharge status: Home / Self Care | Payer: 59 | Attending: Emergency Medicine | Admitting: Emergency Medicine

## 2019-12-27 ENCOUNTER — Other Ambulatory Visit: Payer: Self-pay

## 2019-12-27 DIAGNOSIS — J4541 Moderate persistent asthma with (acute) exacerbation: Secondary | ICD-10-CM | POA: Insufficient documentation

## 2019-12-27 DIAGNOSIS — I10 Essential (primary) hypertension: Secondary | ICD-10-CM | POA: Diagnosis not present

## 2019-12-27 DIAGNOSIS — B20 Human immunodeficiency virus [HIV] disease: Secondary | ICD-10-CM | POA: Diagnosis not present

## 2019-12-27 DIAGNOSIS — Z87891 Personal history of nicotine dependence: Secondary | ICD-10-CM | POA: Insufficient documentation

## 2019-12-27 DIAGNOSIS — R0602 Shortness of breath: Secondary | ICD-10-CM | POA: Diagnosis not present

## 2019-12-27 DIAGNOSIS — Z79899 Other long term (current) drug therapy: Secondary | ICD-10-CM | POA: Insufficient documentation

## 2019-12-27 DIAGNOSIS — R05 Cough: Secondary | ICD-10-CM | POA: Diagnosis not present

## 2019-12-27 LAB — CBC WITH DIFFERENTIAL/PLATELET
Abs Immature Granulocytes: 0.13 10*3/uL — ABNORMAL HIGH (ref 0.00–0.07)
Basophils Absolute: 0.1 10*3/uL (ref 0.0–0.1)
Basophils Relative: 1 %
Eosinophils Absolute: 0.5 10*3/uL (ref 0.0–0.5)
Eosinophils Relative: 5 %
HCT: 43.9 % (ref 36.0–46.0)
Hemoglobin: 14.6 g/dL (ref 12.0–15.0)
Immature Granulocytes: 1 %
Lymphocytes Relative: 37 %
Lymphs Abs: 3.8 10*3/uL (ref 0.7–4.0)
MCH: 30.7 pg (ref 26.0–34.0)
MCHC: 33.3 g/dL (ref 30.0–36.0)
MCV: 92.2 fL (ref 80.0–100.0)
Monocytes Absolute: 1 10*3/uL (ref 0.1–1.0)
Monocytes Relative: 10 %
Neutro Abs: 4.9 10*3/uL (ref 1.7–7.7)
Neutrophils Relative %: 46 %
Platelets: 286 10*3/uL (ref 150–400)
RBC: 4.76 MIL/uL (ref 3.87–5.11)
RDW: 12.5 % (ref 11.5–15.5)
WBC: 10.4 10*3/uL (ref 4.0–10.5)
nRBC: 0 % (ref 0.0–0.2)

## 2019-12-27 LAB — BASIC METABOLIC PANEL
Anion gap: 9 (ref 5–15)
BUN: 15 mg/dL (ref 8–23)
CO2: 30 mmol/L (ref 22–32)
Calcium: 10 mg/dL (ref 8.9–10.3)
Chloride: 99 mmol/L (ref 98–111)
Creatinine, Ser: 0.94 mg/dL (ref 0.44–1.00)
GFR calc Af Amer: >60 mL/min (ref >60)
GFR calc non Af Amer: >60 mL/min (ref >60)
Glucose, Bld: 137 mg/dL — ABNORMAL HIGH (ref 70–99)
Potassium: 3.1 mmol/L — ABNORMAL LOW (ref 3.5–5.1)
Sodium: 138 mmol/L (ref 135–145)

## 2019-12-27 MED ORDER — MAGNESIUM SULFATE 2 GM/50ML IV SOLN
2.0000 g | Freq: Once | INTRAVENOUS | Status: AC
  Administered 2019-12-27: 2 g via INTRAVENOUS
  Filled 2019-12-27: qty 50

## 2019-12-27 MED ORDER — ALBUTEROL SULFATE HFA 108 (90 BASE) MCG/ACT IN AERS
8.0000 | INHALATION_SPRAY | Freq: Once | RESPIRATORY_TRACT | Status: AC
  Administered 2019-12-27: 8 via RESPIRATORY_TRACT
  Filled 2019-12-27: qty 6.7

## 2019-12-27 MED ORDER — BIKTARVY 50-200-25 MG PO TABS: 1.0000 | ORAL_TABLET | Freq: Every day | ORAL | 5 refills | Status: DC

## 2019-12-27 MED ORDER — METHYLPREDNISOLONE SODIUM SUCC 125 MG IJ SOLR
125.0000 mg | Freq: Once | INTRAMUSCULAR | Status: AC
  Administered 2019-12-27: 125 mg via INTRAVENOUS
  Filled 2019-12-27: qty 2

## 2019-12-27 MED ORDER — POTASSIUM CHLORIDE CRYS ER 20 MEQ PO TBCR
40.0000 meq | EXTENDED_RELEASE_TABLET | Freq: Once | ORAL | Status: AC
  Administered 2019-12-27: 40 meq via ORAL
  Filled 2019-12-27: qty 2

## 2019-12-27 NOTE — ED Triage Notes (Addendum)
C/o "a cold" with increasing SOB x 2 days-labored breathing noted-taken to tx area via w/c-last used albuterol ~930pm-congested cough noted

## 2019-12-27 NOTE — Progress Notes (Signed)
Rerouting Biktarvy to [facility] per patient request.

## 2019-12-27 NOTE — ED Provider Notes (Signed)
[facility] DEPT [facility] Provider Note: Georgena Spurling, MD, FACEP  CSN: [id number] MRN: [id number] ARRIVAL: 12/27/19 at [place]: [place] [facility]   HISTORY OF PRESENT ILLNESS  12/27/19 10:57 PM Maria Adkins is a 69 y.o. female with a history of asthma.  She states she has had cold symptoms for a week.  She defines this as cough and shortness of breath.  The shortness of breath worsened over the past 2 to 3 days and is now moderate to severe.  It is worse when lying supine.  She was getting relief from her albuterol inhaler and, more recently, her albuterol nebulizer but this evening after using her nebulizer she did not get adequate relief so she came to the ED.  She denies nasal congestion, sore throat, fever, chills, body ache, loss of taste or smell, nausea, vomiting or diarrhea.  She is HIV positive, well controlled on HAART.  She is contacted her PCP several days ago who placed her on an unspecified antibiotic; review of her records indicates this medication was doxycycline prescribed 09/02/2020.   Past Medical History:  Diagnosis Date  . Asthma   . High blood pressure   . HIV (human immunodeficiency virus infection) ([place])   . Osteoarthritis     Past Surgical History:  Procedure Laterality Date  . Biopsy of Liver    . CATARACT EXTRACTION, BILATERAL    . CHOLECYSTECTOMY    . CYST REMOVAL HAND Left   . LAPAROSCOPIC SALPINGOOPHERECTOMY Right     Family History  Problem Relation Age of Onset  . Diabetes Mother        died at age 38  . Hypertension Mother   . Asthma Sister   . Diabetes Sister   . Hypertension Sister   . Arthritis Sister   . Diabetes Sister   . Hypertension Sister   . Asthma Sister   . Arthritis Sister   . Asthma Son        controlled    Social History   Tobacco Use  . Smoking status: Former Smoker    Packs/day: 1.00    Years: 40.00    Pack years: 40.00    Types: Cigarettes    Quit date: 09/14/1978    Years  since quitting: 41.3  . Smokeless tobacco: Never Used  Substance Use Topics  . Alcohol use: No  . Drug use: No    Prior to Admission medications   Medication Sig Start Date End Date Taking? Authorizing Provider  albuterol (PROVENTIL) (2.5 MG/3ML) 0.083% nebulizer solution Take 3 mLs (2.5 mg total) by nebulization every 4 (four) hours as needed for wheezing or shortness of breath. 12/22/19   Lauree Chandler, NP  albuterol (VENTOLIN HFA) 108 (90 Base) MCG/ACT inhaler INHALE 2 PUFFS BY MOUTH EVERY 4 HOURS AS NEEDED 05/23/19   Lauree Chandler, NP  bictegravir-emtricitabine-tenofovir AF (BIKTARVY) 50-200-25 MG TABS tablet Take 1 tablet by mouth daily. 12/27/19   Kuppelweiser, Cassie L, RPH-CPP  Blood Pressure Monitoring (BLOOD PRESSURE CUFF) MISC 1 Device by Does not apply route daily. DX: I10 07/14/19   Lauree Chandler, NP  budesonide-formoterol (SYMBICORT) 160-4.5 MCG/ACT inhaler INHALE TWO PUFFS INTO THE LUNGS EVERY MORNING 10/27/19   Lauree Chandler, NP  Calcium Carbonate (CALCIUM 600 PO) Take by mouth daily.    [provider]  doxycycline (VIBRA-TABS) 100 MG tablet Take 1 tablet (100 mg total) by mouth 2 (two) times daily. 12/25/19   Sherrie Mustache  K, NP  enalapril-hydrochlorothiazide (VASERETIC) 10-25 MG tablet TAKE 1 TABLET BY MOUTH EVERY DAY 08/14/19   Lauree Chandler, NP  metoprolol tartrate (LOPRESSOR) 100 MG tablet TAKE 1 TABLET BY MOUTH EVERY DAY 07/24/19   Lauree Chandler, NP  montelukast (SINGULAIR) 10 MG tablet TAKE 1 TABLET BY MOUTH EVERYDAY AT BEDTIME 06/30/19   Lauree Chandler, NP  Multiple Vitamins-Minerals (MULTIVITAMIN GUMMIES ADULT PO) Take 2 tablets by mouth daily. Gummy    [provider]  pravastatin (PRAVACHOL) 20 MG tablet TAKE 1 TABLET BY MOUTH EVERY DAY 08/29/19   Lauree Chandler, NP  predniSONE (DELTASONE) 20 MG tablet 3 tabs po day one, then 2 po daily x 4 days 12/28/19   Heylee Tant, MD  traZODone (DESYREL) 50 MG tablet TAKE 0.5  TABLETS (25 MG TOTAL) BY MOUTH AT BEDTIME. 02/17/19   Lauree Chandler, NP  VITAMIN D PO Take 1 capsule by mouth daily.    [provider]    Allergies Patient has no known allergies.   REVIEW OF SYSTEMS  Negative except as noted here or in the History of Present Illness.   PHYSICAL EXAMINATION  Initial Vital Signs Blood pressure (!) 162/89, pulse 90, temperature 97.9 F (36.6 C), temperature source Oral, resp. rate (!) 32, height 5\' 8"  (1.727 m), weight 78.9 kg, SpO2 (!) 88 %.  Examination General: Well-developed, well-nourished female in no acute distress; appearance consistent with age of record HENT: normocephalic; atraumatic Eyes: pupils equal, round and reactive to light; extraocular muscles intact Neck: supple Heart: regular rate and rhythm Lungs: Decreased air movement bilaterally; tachypnea; shallow breaths Abdomen: soft; nondistended; nontender; bowel sounds present Extremities: No deformity; full range of motion; pulses normal Neurologic: Awake, alert and oriented; motor function intact in all extremities and symmetric; no facial droop Skin: Warm and dry Psychiatric: Normal mood and affect   RESULTS  Summary of this visit's results, reviewed and interpreted by myself:   EKG Interpretation  Date/Time:    Ventricular Rate:    PR Interval:    QRS Duration:   QT Interval:    QTC Calculation:   R Axis:     Text Interpretation:        Laboratory Studies: Results for orders placed or performed during the hospital encounter of 12/27/19 (from the past 24 hour(s))  CBC with Differential/Platelet     Status: Abnormal   Collection Time: 12/27/19 10:54 PM  Result Value Ref Range   WBC 10.4 4.0 - 10.5 K/uL   RBC 4.76 3.87 - 5.11 MIL/uL   Hemoglobin 14.6 12.0 - 15.0 g/dL   HCT 43.9 36.0 - 46.0 %   MCV 92.2 80.0 - 100.0 fL   MCH 30.7 26.0 - 34.0 pg   MCHC 33.3 30.0 - 36.0 g/dL   RDW 12.5 11.5 - 15.5 %   Platelets 286 150 - 400 K/uL   nRBC 0.0 0.0 - 0.2  %   Neutrophils Relative % 46 %   Neutro Abs 4.9 1.7 - 7.7 K/uL   Lymphocytes Relative 37 %   Lymphs Abs 3.8 0.7 - 4.0 K/uL   Monocytes Relative 10 %   Monocytes Absolute 1.0 0.1 - 1.0 K/uL   Eosinophils Relative 5 %   Eosinophils Absolute 0.5 0.0 - 0.5 K/uL   Basophils Relative 1 %   Basophils Absolute 0.1 0.0 - 0.1 K/uL   Immature Granulocytes 1 %   Abs Immature Granulocytes 0.13 (H) 0.00 - 0.07 K/uL  Basic metabolic panel  Status: Abnormal   Collection Time: 12/27/19 10:54 PM  Result Value Ref Range   Sodium 138 135 - 145 mmol/L   Potassium 3.1 (L) 3.5 - 5.1 mmol/L   Chloride 99 98 - 111 mmol/L   CO2 30 22 - 32 mmol/L   Glucose, Bld 137 (H) 70 - 99 mg/dL   BUN 15 8 - 23 mg/dL   Creatinine, Ser 0.94 0.44 - 1.00 mg/dL   Calcium 10.0 8.9 - 10.3 mg/dL   GFR calc non Af Amer >60 >60 mL/min   GFR calc Af Amer >60 >60 mL/min   Anion gap 9 5 - 15   Imaging Studies: DG Chest Portable 1 View  Result Date: 12/27/2019 CLINICAL DATA:  Shortness of breath and cough EXAM: PORTABLE CHEST 1 VIEW COMPARISON:  06/05/2018 FINDINGS: The heart size and mediastinal contours are within normal limits. Both lungs are clear. The visualized skeletal structures are unremarkable. Aortic atherosclerosis. IMPRESSION: No active disease. Electronically Signed   By: Donavan Foil M.D.   On: 12/27/2019 23:14    ED COURSE and MDM  Nursing notes, initial and subsequent vitals signs, including pulse oximetry, reviewed and interpreted by myself.  Vitals:   12/27/19 2239 12/27/19 2330 12/28/19 0010 12/28/19 0015  BP: (!) 162/89 134/73    Pulse: 90 87  92  Resp: (!) 32 (!) 28 (!) 26 (!) 26  Temp: 97.9 F (36.6 C)     TempSrc: Oral     SpO2: (!) 88% 96%  94%  Weight: 78.9 kg     Height: 5\' 8"  (1.727 m)      Medications  albuterol (VENTOLIN HFA) 108 (90 Base) MCG/ACT inhaler 8 puff (8 puffs Inhalation Given 12/27/19 2326)  methylPREDNISolone sodium succinate (SOLU-MEDROL) 125 mg/2 mL injection 125 mg  (125 mg Intravenous Given 12/27/19 2326)  magnesium sulfate IVPB 2 g 50 mL ( Intravenous Stopped 12/28/19 0012)  potassium chloride SA (KLOR-CON) CR tablet 40 mEq (40 mEq Oral Given 12/27/19 2337)   11:28 PM Patient has received her full Covid vaccine series so Covid is low on my differential diagnosis.  Apart from cough and shortness of breath she has no Covid defining symptoms.  Eight puffs of albuterol and IV Solu-Medrol and magnesium ordered.  12:36 AM Air movement significantly improved with minimal wheezing.  Patient feels better, work of breathing has improved.  Patient ready to go home.  Will start on a brief steroid taper.   PROCEDURES  Procedures   ED DIAGNOSES     ICD-10-CM   1. Moderate persistent asthma with exacerbation  J45.41        Epimenio Schetter, Jenny Reichmann, MD 12/28/19 [phone]

## 2019-12-27 NOTE — Progress Notes (Signed)
RN gave albuterol treatment °

## 2019-12-27 NOTE — Progress Notes (Signed)
Patient's SPO2 88% in triage.  Placed patient on 2 liter nasal cannula.  Patient's SPO2 increased to 96%.

## 2019-12-27 NOTE — ED Notes (Signed)
Pts [phone] Husband

## 2019-12-28 MED ORDER — PREDNISONE 20 MG PO TABS: ORAL_TABLET | ORAL | 0 refills | Status: DC

## 2019-12-28 MED FILL — BIKTARVY 50-200-25 MG TABS: 50-200-25 | 30 days supply | Qty: 30 | Fill #0

## 2019-12-28 MED FILL — predniSONE 20 MG TABS: 20 | 5 days supply | Qty: 11 | Fill #0

## 2019-12-28 NOTE — ED Notes (Signed)
PT ambulated to BR with assist, very mild sob. Non labored breathing, sat 92-94%.

## 2020-01-02 ENCOUNTER — Other Ambulatory Visit (HOSPITAL_COMMUNITY)
Admission: RE | Admit: 2020-01-02 | Discharge: 2020-01-02 | Disposition: A | Discharge status: Home / Self Care | Payer: 59 | Source: Ambulatory Visit | Attending: Internal Medicine | Admitting: Internal Medicine

## 2020-01-02 ENCOUNTER — Other Ambulatory Visit: Payer: 59

## 2020-01-02 ENCOUNTER — Telehealth: Payer: Self-pay | Admitting: *Deleted

## 2020-01-02 ENCOUNTER — Other Ambulatory Visit: Payer: Self-pay

## 2020-01-02 DIAGNOSIS — Z79899 Other long term (current) drug therapy: Secondary | ICD-10-CM

## 2020-01-02 DIAGNOSIS — Z113 Encounter for screening for infections with a predominantly sexual mode of transmission: Secondary | ICD-10-CM

## 2020-01-02 DIAGNOSIS — B20 Human immunodeficiency virus [HIV] disease: Secondary | ICD-10-CM

## 2020-01-02 NOTE — Telephone Encounter (Signed)
She does have an appointment on 01/12/20, she just wanted to mention these things before her appt.

## 2020-01-02 NOTE — Telephone Encounter (Signed)
Pt wanted to discuss some things before her visit, she would like to referred to pulmonary due to her last asthma attack. Pt would like to also get allergy testing. Pt would also like to discuss her medications and that she has duplicate. After going thru her med list, pt was talking about the albuterol inhaler and nebulizer.

## 2020-01-02 NOTE — Telephone Encounter (Signed)
She will need an office visit to discuss and order these request.

## 2020-01-03 LAB — URINE CYTOLOGY ANCILLARY ONLY
Chlamydia: NEGATIVE
Comment: NEGATIVE
Comment: NORMAL
Neisseria Gonorrhea: NEGATIVE

## 2020-01-03 LAB — T-HELPER CELL (CD4) - (RCID CLINIC ONLY)
CD4 % Helper T Cell: 14 % — ABNORMAL LOW (ref 33–65)
CD4 T Cell Abs: 465 /uL (ref 400–1790)

## 2020-01-04 LAB — COMPREHENSIVE METABOLIC PANEL
AG Ratio: 1.6 (calc) (ref 1.0–2.5)
ALT: 28 U/L (ref 6–29)
AST: 20 U/L (ref 10–35)
Albumin: 3.9 g/dL (ref 3.6–5.1)
Alkaline phosphatase (APISO): 54 U/L (ref 37–153)
BUN/Creatinine Ratio: 20 (calc) (ref 6–22)
BUN: 20 mg/dL (ref 7–25)
CO2: 34 mmol/L — ABNORMAL HIGH (ref 20–32)
Calcium: 10 mg/dL (ref 8.6–10.4)
Chloride: 101 mmol/L (ref 98–110)
Creat: 0.98 mg/dL — ABNORMAL HIGH (ref 0.60–0.93)
Globulin: 2.5 g/dL (calc) (ref 1.9–3.7)
Glucose, Bld: 83 mg/dL (ref 65–99)
Potassium: 3.8 mmol/L (ref 3.5–5.3)
Sodium: 141 mmol/L (ref 135–146)
Total Bilirubin: 0.8 mg/dL (ref 0.2–1.2)
Total Protein: 6.4 g/dL (ref 6.1–8.1)

## 2020-01-04 LAB — CBC WITH DIFFERENTIAL/PLATELET
Absolute Monocytes: 686 cells/uL (ref 200–950)
Basophils Absolute: 79 cells/uL (ref 0–200)
Basophils Relative: 0.6 %
Eosinophils Absolute: 26 cells/uL (ref 15–500)
Eosinophils Relative: 0.2 %
HCT: 41.7 % (ref 35.0–45.0)
Hemoglobin: 13.9 g/dL (ref 11.7–15.5)
Lymphs Abs: 3524 cells/uL (ref 850–3900)
MCH: 30.6 pg (ref 27.0–33.0)
MCHC: 33.3 g/dL (ref 32.0–36.0)
MCV: 91.9 fL (ref 80.0–100.0)
MPV: 9.6 fL (ref 7.5–12.5)
Monocytes Relative: 5.2 %
Neutro Abs: 8884 cells/uL — ABNORMAL HIGH (ref 1500–7800)
Neutrophils Relative %: 67.3 %
Platelets: 301 10*3/uL (ref 140–400)
RBC: 4.54 10*6/uL (ref 3.80–5.10)
RDW: 12.8 % (ref 11.0–15.0)
Total Lymphocyte: 26.7 %
WBC: 13.2 10*3/uL — ABNORMAL HIGH (ref 3.8–10.8)

## 2020-01-04 LAB — LIPID PANEL
Cholesterol: 155 mg/dL (ref <200)
HDL: 44 mg/dL — ABNORMAL LOW (ref >=50)
LDL Cholesterol (Calc): 83 mg/dL (calc)
Non-HDL Cholesterol (Calc): 111 mg/dL (calc) (ref <130)
Total CHOL/HDL Ratio: 3.5 (calc) (ref <5.0)
Triglycerides: 186 mg/dL — ABNORMAL HIGH (ref <150)

## 2020-01-04 LAB — HIV-1 RNA QUANT-NO REFLEX-BLD
HIV 1 RNA Quant: <20 copies/mL
HIV-1 RNA Quant, Log: <1.3 Log copies/mL

## 2020-01-04 LAB — RPR: RPR Ser Ql: NONREACTIVE

## 2020-01-08 ENCOUNTER — Other Ambulatory Visit: Payer: 59

## 2020-01-08 ENCOUNTER — Other Ambulatory Visit: Payer: Self-pay

## 2020-01-08 DIAGNOSIS — E782 Mixed hyperlipidemia: Secondary | ICD-10-CM | POA: Diagnosis not present

## 2020-01-08 DIAGNOSIS — I10 Essential (primary) hypertension: Secondary | ICD-10-CM

## 2020-01-08 LAB — COMPLETE METABOLIC PANEL WITH GFR
AG Ratio: 1.6 (calc) (ref 1.0–2.5)
ALT: 25 U/L (ref 6–29)
AST: 18 U/L (ref 10–35)
Albumin: 3.8 g/dL (ref 3.6–5.1)
Alkaline phosphatase (APISO): 41 U/L (ref 37–153)
BUN/Creatinine Ratio: 14 (calc) (ref 6–22)
BUN: 13 mg/dL (ref 7–25)
CO2: 32 mmol/L (ref 20–32)
Calcium: 9.2 mg/dL (ref 8.6–10.4)
Chloride: 102 mmol/L (ref 98–110)
Creat: 0.95 mg/dL — ABNORMAL HIGH (ref 0.60–0.93)
GFR, Est African American: 70 mL/min/{1.73_m2} (ref >=60)
GFR, Est Non African American: 61 mL/min/{1.73_m2} (ref >=60)
Globulin: 2.4 g/dL (calc) (ref 1.9–3.7)
Glucose, Bld: 99 mg/dL (ref 65–99)
Potassium: 3.6 mmol/L (ref 3.5–5.3)
Sodium: 140 mmol/L (ref 135–146)
Total Bilirubin: 1 mg/dL (ref 0.2–1.2)
Total Protein: 6.2 g/dL (ref 6.1–8.1)

## 2020-01-08 LAB — CBC WITH DIFFERENTIAL/PLATELET
Absolute Monocytes: 653 cells/uL (ref 200–950)
Basophils Absolute: 90 cells/uL (ref 0–200)
Basophils Relative: 1.4 %
Eosinophils Absolute: 294 cells/uL (ref 15–500)
Eosinophils Relative: 4.6 %
HCT: 41.1 % (ref 35.0–45.0)
Hemoglobin: 13.5 g/dL (ref 11.7–15.5)
Lymphs Abs: 3046 cells/uL (ref 850–3900)
MCH: 30.3 pg (ref 27.0–33.0)
MCHC: 32.8 g/dL (ref 32.0–36.0)
MCV: 92.2 fL (ref 80.0–100.0)
MPV: 10 fL (ref 7.5–12.5)
Monocytes Relative: 10.2 %
Neutro Abs: 2317 cells/uL (ref 1500–7800)
Neutrophils Relative %: 36.2 %
Platelets: 251 10*3/uL (ref 140–400)
RBC: 4.46 10*6/uL (ref 3.80–5.10)
RDW: 13.1 % (ref 11.0–15.0)
Total Lymphocyte: 47.6 %
WBC: 6.4 10*3/uL (ref 3.8–10.8)

## 2020-01-08 LAB — LIPID PANEL
Cholesterol: 141 mg/dL (ref <200)
HDL: 44 mg/dL — ABNORMAL LOW (ref >=50)
LDL Cholesterol (Calc): 78 mg/dL (calc)
Non-HDL Cholesterol (Calc): 97 mg/dL (calc) (ref <130)
Total CHOL/HDL Ratio: 3.2 (calc) (ref <5.0)
Triglycerides: 102 mg/dL (ref <150)

## 2020-01-12 ENCOUNTER — Ambulatory Visit (INDEPENDENT_AMBULATORY_CARE_PROVIDER_SITE_OTHER): Payer: 59 | Admitting: Nurse Practitioner

## 2020-01-12 ENCOUNTER — Other Ambulatory Visit: Payer: Self-pay

## 2020-01-12 ENCOUNTER — Encounter: Payer: Self-pay | Admitting: Nurse Practitioner

## 2020-01-12 VITALS — BP 118/72 | HR 74 | Temp 98.7°F | Ht 68.0 in | Wt 172.6 lb

## 2020-01-12 DIAGNOSIS — J4531 Mild persistent asthma with (acute) exacerbation: Secondary | ICD-10-CM | POA: Diagnosis not present

## 2020-01-12 DIAGNOSIS — M8580 Other specified disorders of bone density and structure, unspecified site: Secondary | ICD-10-CM

## 2020-01-12 DIAGNOSIS — G4700 Insomnia, unspecified: Secondary | ICD-10-CM | POA: Diagnosis not present

## 2020-01-12 DIAGNOSIS — J302 Other seasonal allergic rhinitis: Secondary | ICD-10-CM

## 2020-01-12 DIAGNOSIS — I10 Essential (primary) hypertension: Secondary | ICD-10-CM

## 2020-01-12 MED ORDER — BENEFIBER DRINK MIX PO PACK: PACK | ORAL | Status: DC

## 2020-01-12 MED ORDER — PROBIOTIC 250 MG PO CAPS: ORAL_CAPSULE | ORAL | Status: DC

## 2020-01-12 MED ORDER — BUDESONIDE-FORMOTEROL FUMARATE 160-4.5 MCG/ACT IN AERO: 2.0000 | INHALATION_SPRAY | Freq: Two times a day (BID) | RESPIRATORY_TRACT | 1 refills | Status: DC

## 2020-01-12 MED FILL — SYMBICORT 160-4.5 MCG INH: 160-4.5 | 30 days supply | Qty: 10 | Fill #0

## 2020-01-12 NOTE — Patient Instructions (Signed)
pataday over the counter as needed for allergies in the eye  For dry eyes can use artifical tears

## 2020-01-12 NOTE — Progress Notes (Signed)
Careteam: Patient Care Team: Lauree Chandler, NP as PCP - General (Geriatric Medicine)  PLACE OF SERVICE:  [place] Directive information Does Patient Have a Medical Advance Directive?: Yes, Does patient want to make changes to medical advance directive?: No - Patient declined  Allergies  Allergen Reactions  . No Healthtouch Food Allergies     Seasonal Allergies     Chief Complaint  Patient presents with  . Medical Management of Chronic Issues    6 month follow up      HPI: Patient is a 69 y.o. female presenting to office today for 6 month follow up and lab review.   Asthma- Pt is breathing better today, she went to the ER for an asthma exacerbation.She She wants a better regimen for her asthma so she does not have to go the ER as frequently.  She reports about 4 flares in the last year but had to go to the ER once. Pt wants a peak flow meter to see how her breathing is daily.   Hypertension- controlled, Pt wants to come off some of her BP medications. She states that she is healthier now than when she began her medications. Pt does take BP at home regularly.   Stye- Pt eye is better today, did not go to the eye doctor.   Seasonal allergies- Pt is requesting some allergy drops- her eyes are bothering her.     Review of Systems:  Review of Systems  Constitutional: Negative for chills and fever.  HENT: Negative for hearing loss and sinus pain.   Eyes: Positive for redness (allergies/ itchy).  Respiratory: Positive for shortness of breath. Negative for cough.   Cardiovascular: Negative for chest pain and palpitations.  Gastrointestinal: Negative for nausea and vomiting.  Genitourinary: Negative.   Musculoskeletal: Negative.   Skin: Negative for itching and rash.  Neurological: Negative for dizziness, weakness and headaches.  Psychiatric/Behavioral: Negative for depression. The patient is not nervous/anxious.     Past Medical History:  Diagnosis Date   . Asthma   . High blood pressure   . HIV (human immunodeficiency virus infection) ([place])   . Osteoarthritis    Past Surgical History:  Procedure Laterality Date  . Biopsy of Liver    . CATARACT EXTRACTION, BILATERAL    . CHOLECYSTECTOMY    . CYST REMOVAL HAND Left   . LAPAROSCOPIC SALPINGOOPHERECTOMY Right    Social History:   reports that she quit smoking about 41 years ago. Her smoking use included cigarettes. She has a 40.00 pack-year smoking history. She has never used smokeless tobacco. She reports that she does not drink alcohol or use drugs.  Family History  Problem Relation Age of Onset  . Diabetes Mother        died at age 77  . Hypertension Mother   . Asthma Sister   . Diabetes Sister   . Hypertension Sister   . Arthritis Sister   . Diabetes Sister   . Hypertension Sister   . Asthma Sister   . Arthritis Sister   . Asthma Son        controlled    Medications: Patient's Medications  New Prescriptions   No medications on file  Previous Medications   ALBUTEROL (PROVENTIL) (2.5 MG/3ML) 0.083% NEBULIZER SOLUTION    Take 3 mLs (2.5 mg total) by nebulization every 4 (four) hours as needed for wheezing or shortness of breath.   ALBUTEROL (VENTOLIN HFA) 108 (90 BASE) MCG/ACT INHALER  INHALE 2 PUFFS BY MOUTH EVERY 4 HOURS AS NEEDED   BICTEGRAVIR-EMTRICITABINE-TENOFOVIR AF (BIKTARVY) 50-200-25 MG TABS TABLET    Take 1 tablet by mouth daily.   BLOOD PRESSURE MONITORING (BLOOD PRESSURE CUFF) MISC    1 Device by Does not apply route daily. DX: I10   BUDESONIDE-FORMOTEROL (SYMBICORT) 160-4.5 MCG/ACT INHALER    INHALE TWO PUFFS INTO THE LUNGS EVERY MORNING   CALCIUM CARBONATE (CALCIUM 600 PO)    Take by mouth daily.   ENALAPRIL-HYDROCHLOROTHIAZIDE (VASERETIC) 10-25 MG TABLET    TAKE 1 TABLET BY MOUTH EVERY DAY   METOPROLOL TARTRATE (LOPRESSOR) 100 MG TABLET    TAKE 1 TABLET BY MOUTH EVERY DAY   MONTELUKAST (SINGULAIR) 10 MG TABLET    TAKE 1 TABLET BY MOUTH EVERYDAY AT  BEDTIME   MULTIPLE VITAMINS-MINERALS (MULTIVITAMIN GUMMIES ADULT PO)    Take 2 tablets by mouth daily. Gummy   PRAVASTATIN (PRAVACHOL) 20 MG TABLET    TAKE 1 TABLET BY MOUTH EVERY DAY   TRAZODONE (DESYREL) 50 MG TABLET    TAKE 0.5 TABLETS (25 MG TOTAL) BY MOUTH AT BEDTIME.   VITAMIN D PO    Take 1 capsule by mouth daily.  Modified Medications   No medications on file  Discontinued Medications   No medications on file    Physical Exam:  Vitals:   01/12/20 0929  BP: 118/72  Pulse: 74  Temp: 98.7 F (37.1 C)  TempSrc: Temporal  SpO2: 97%  Weight: 172 lb 9.6 oz (78.3 kg)  Height: 5\' 8"  (1.727 m)   Body mass index is 26.24 kg/m. Wt Readings from Last 3 Encounters:  01/12/20 172 lb 9.6 oz (78.3 kg)  12/27/19 174 lb (78.9 kg)  12/22/19 172 lb 12.8 oz (78.4 kg)    Physical Exam Vitals and nursing note reviewed.  Constitutional:      Appearance: Normal appearance. She is normal weight.  HENT:     Nose: Nose normal.  Eyes:     Conjunctiva/sclera: Conjunctivae normal.     Pupils: Pupils are equal, round, and reactive to light.  Cardiovascular:     Rate and Rhythm: Normal rate and regular rhythm.     Pulses: Normal pulses.     Heart sounds: Normal heart sounds, S1 normal and S2 normal.  Pulmonary:     Effort: Pulmonary effort is normal.     Breath sounds: Normal breath sounds.  Abdominal:     General: Bowel sounds are normal.     Palpations: Abdomen is soft.  Musculoskeletal:     Right lower leg: No edema.     Left lower leg: No edema.  Skin:    General: Skin is warm and dry.  Neurological:     General: No focal deficit present.     Mental Status: She is alert and oriented to person, place, and time.  Psychiatric:        Attention and Perception: Attention and perception normal.        Mood and Affect: Mood and affect normal.        Speech: Speech normal.        Behavior: Behavior normal. Behavior is cooperative.        Thought Content: Thought content normal.          Cognition and Memory: Cognition and memory normal.        Judgment: Judgment normal.     Labs reviewed: Basic Metabolic Panel: Recent Labs    12/27/19 2254 01/02/20 1049 01/08/20 [id number]  NA 138 141 140  K 3.1* 3.8 3.6  CL 99 101 102  CO2 30 34* 32  GLUCOSE 137* 83 99  BUN 15 20 13   CREATININE 0.94 0.98* 0.95*  CALCIUM 10.0 10.0 9.2   Liver Function Tests: Recent Labs    04/05/19 0918 01/02/20 1049 01/08/20 0842  AST 32 20 18  ALT 32* 28 25  BILITOT 1.1 0.8 1.0  PROT 6.6 6.4 6.2   No results for input(s): LIPASE, AMYLASE in the last 8760 hours. No results for input(s): AMMONIA in the last 8760 hours. CBC: Recent Labs    12/27/19 2254 01/02/20 1049 01/08/20 0842  WBC 10.4 13.2* 6.4  NEUTROABS 4.9 8,884* 2,317  HGB 14.6 13.9 13.5  HCT 43.9 41.7 41.1  MCV 92.2 91.9 92.2  PLT 286 301 251   Lipid Panel: Recent Labs    01/02/20 1049 01/08/20 0842  CHOL 155 141  HDL 44* 44*  LDLCALC 83 78  TRIG 186* 102  CHOLHDL 3.5 3.2   TSH: No results for input(s): TSH in the last 8760 hours. A1C: No results found for: HGBA1C   Assessment/Plan 1. Essential hypertension -Stable. Pt bp well controlled at this time  -Continue Vaseretic 10-25mg  daily; Lopressor 100 mg daily a tthis time consider dose reduction with lifestyle modifications.   2. Seasonal allergies -Pt having recurrent allergy flares. Dry/Itchy eyes, can use OTC pataday for allergies or artifical tears for dryness but to notify if she has any yellow/green drainage or pain.  continues on Singulair    3. Insomnia, unspecified type -improved, Pt is sleeping well without the Trazodone- removed from medication list.   4. Osteopenia, unspecified location -Stable -Continue Calcium/Vitamin D supplements with weight bearing activity.   5. Mild persistent asthma with acute exacerbation -flare has resolved, overall improved at this time. She previously was taking Symbicort 2 puff daily but now has  increased to twice daily for proper management which has improved symptoms. . -Prescription for peak flow meter for patient to use at home to manage exacerations and when to call office before needed ER. -Continue Symbicort 2 puffs twice daily; Albuterol inhaler 2 puffs every 4-6hrs as needed; Albuterol nebulizer every 4-6hr as needed.   Next appt: 4 months follow up [place]. Jacyln Carmer, [facility] [phone]   I personally was present during the history, physical exam and medical decision-making activities of this service and have verified that the service and findings are accurately documented in the student's note

## 2020-01-12 NOTE — Progress Notes (Deleted)
Careteam: Patient Care Team: Lauree Chandler, NP as PCP - General (Geriatric Medicine)  PLACE OF SERVICE:  [place] Directive information Does Patient Have a Medical Advance Directive?: Yes, Does patient want to make changes to medical advance directive?: No - Patient declined  Allergies  Allergen Reactions  . No Healthtouch Food Allergies     Seasonal Allergies     Chief Complaint  Patient presents with  . Medical Management of Chronic Issues    6 month follow up      HPI: Patient is a 69 y.o. female ***  Review of Systems:  ROS***  Past Medical History:  Diagnosis Date  . Asthma   . High blood pressure   . HIV (human immunodeficiency virus infection) ([place])   . Osteoarthritis    Past Surgical History:  Procedure Laterality Date  . Biopsy of Liver    . CATARACT EXTRACTION, BILATERAL    . CHOLECYSTECTOMY    . CYST REMOVAL HAND Left   . LAPAROSCOPIC SALPINGOOPHERECTOMY Right    Social History:   reports that she quit smoking about 41 years ago. Her smoking use included cigarettes. She has a 40.00 pack-year smoking history. She has never used smokeless tobacco. She reports that she does not drink alcohol or use drugs.  Family History  Problem Relation Age of Onset  . Diabetes Mother        died at age 65  . Hypertension Mother   . Asthma Sister   . Diabetes Sister   . Hypertension Sister   . Arthritis Sister   . Diabetes Sister   . Hypertension Sister   . Asthma Sister   . Arthritis Sister   . Asthma Son        controlled    Medications: Patient's Medications  New Prescriptions   No medications on file  Previous Medications   ALBUTEROL (PROVENTIL) (2.5 MG/3ML) 0.083% NEBULIZER SOLUTION    Take 3 mLs (2.5 mg total) by nebulization every 4 (four) hours as needed for wheezing or shortness of breath.   ALBUTEROL (VENTOLIN HFA) 108 (90 BASE) MCG/ACT INHALER    INHALE 2 PUFFS BY MOUTH EVERY 4 HOURS AS NEEDED   BICTEGRAVIR-EMTRICITABINE-TENOFOVIR AF (BIKTARVY) 50-200-25 MG TABS TABLET    Take 1 tablet by mouth daily.   BLOOD PRESSURE MONITORING (BLOOD PRESSURE CUFF) MISC    1 Device by Does not apply route daily. DX: I10   BUDESONIDE-FORMOTEROL (SYMBICORT) 160-4.5 MCG/ACT INHALER    INHALE TWO PUFFS INTO THE LUNGS EVERY MORNING   CALCIUM CARBONATE (CALCIUM 600 PO)    Take by mouth daily.   ENALAPRIL-HYDROCHLOROTHIAZIDE (VASERETIC) 10-25 MG TABLET    TAKE 1 TABLET BY MOUTH EVERY DAY   METOPROLOL TARTRATE (LOPRESSOR) 100 MG TABLET    TAKE 1 TABLET BY MOUTH EVERY DAY   MONTELUKAST (SINGULAIR) 10 MG TABLET    TAKE 1 TABLET BY MOUTH EVERYDAY AT BEDTIME   MULTIPLE VITAMINS-MINERALS (MULTIVITAMIN GUMMIES ADULT PO)    Take 2 tablets by mouth daily. Gummy   PRAVASTATIN (PRAVACHOL) 20 MG TABLET    TAKE 1 TABLET BY MOUTH EVERY DAY   TRAZODONE (DESYREL) 50 MG TABLET    TAKE 0.5 TABLETS (25 MG TOTAL) BY MOUTH AT BEDTIME.   VITAMIN D PO    Take 1 capsule by mouth daily.  Modified Medications   No medications on file  Discontinued Medications   No medications on file    Physical Exam:  Vitals:   01/12/20  0929  BP: 118/72  Pulse: 74  Temp: 98.7 F (37.1 C)  TempSrc: Temporal  SpO2: 97%  Weight: 172 lb 9.6 oz (78.3 kg)  Height: 5\' 8"  (1.727 m)   Body mass index is 26.24 kg/m. Wt Readings from Last 3 Encounters:  01/12/20 172 lb 9.6 oz (78.3 kg)  12/27/19 174 lb (78.9 kg)  12/22/19 172 lb 12.8 oz (78.4 kg)    Physical Exam***  Labs reviewed: Basic Metabolic Panel: Recent Labs    12/27/19 2254 01/02/20 1049 01/08/20 0842  NA 138 141 140  K 3.1* 3.8 3.6  CL 99 101 102  CO2 30 34* 32  GLUCOSE 137* 83 99  BUN 15 20 13   CREATININE 0.94 0.98* 0.95*  CALCIUM 10.0 10.0 9.2   Liver Function Tests: Recent Labs    04/05/19 0918 01/02/20 1049 01/08/20 0842  AST 32 20 18  ALT 32* 28 25  BILITOT 1.1 0.8 1.0  PROT 6.6 6.4 6.2   No results for input(s): LIPASE, AMYLASE in the last 8760  hours. No results for input(s): AMMONIA in the last 8760 hours. CBC: Recent Labs    12/27/19 2254 01/02/20 1049 01/08/20 0842  WBC 10.4 13.2* 6.4  NEUTROABS 4.9 8,884* 2,317  HGB 14.6 13.9 13.5  HCT 43.9 41.7 41.1  MCV 92.2 91.9 92.2  PLT 286 301 251   Lipid Panel: Recent Labs    01/02/20 1049 01/08/20 0842  CHOL 155 141  HDL 44* 44*  LDLCALC 83 78  TRIG 186* 102  CHOLHDL 3.5 3.2   TSH: No results for input(s): TSH in the last 8760 hours. A1C: No results found for: HGBA1C   Assessment/Plan There are no diagnoses linked to this encounter.  Next appt: *** Nikkole Placzek K. [place], [facility] [phone]

## 2020-01-15 ENCOUNTER — Telehealth: Payer: Self-pay | Admitting: *Deleted

## 2020-01-15 NOTE — Telephone Encounter (Signed)
Received prior authorization for Symbicort from [facility].  Called and spoke with Estill Bamberg [phone] and she stated that pharmacy is billing through [facility] and they need to bill through primary insurance. Medication does not need prior authorization.   Called and spoke with Abbe Amsterdam at pharmacy and he stated that he will take care of it.

## 2020-01-17 ENCOUNTER — Encounter: Payer: Self-pay | Admitting: Internal Medicine

## 2020-01-17 ENCOUNTER — Ambulatory Visit (INDEPENDENT_AMBULATORY_CARE_PROVIDER_SITE_OTHER): Payer: 59 | Admitting: Internal Medicine

## 2020-01-17 ENCOUNTER — Other Ambulatory Visit: Payer: Self-pay

## 2020-01-17 VITALS — BP 133/78 | HR 62 | Temp 98.2°F | Ht 68.0 in | Wt 170.0 lb

## 2020-01-17 DIAGNOSIS — Z0000 Encounter for general adult medical examination without abnormal findings: Secondary | ICD-10-CM

## 2020-01-17 DIAGNOSIS — J4520 Mild intermittent asthma, uncomplicated: Secondary | ICD-10-CM | POA: Diagnosis not present

## 2020-01-17 DIAGNOSIS — B20 Human immunodeficiency virus [HIV] disease: Secondary | ICD-10-CM

## 2020-01-17 NOTE — Progress Notes (Signed)
RFV: follow up for hiv disease  [name], female   DOB: 06-10-1951, 69 y.o.   MRN: [id number]  HPI 30yoF with well controlled HIV disease. CD 4 count of 465/VL< 20, No missing doses of biktarvy. Caught a cold from grandson that lasted 7 days then her asthma that required ED visit needing steroids. Now better back to her regular health. Walking 9,000 steps per day. Doing weights in gym. Eating well. Feeling better. Her son gets his PhD Nursing next month  She is fully immunized She is back going to church, wearing masks.  Outpatient Encounter Medications as of 01/17/2020  Medication Sig  . albuterol (PROVENTIL) (2.5 MG/3ML) 0.083% nebulizer solution Take 3 mLs (2.5 mg total) by nebulization every 4 (four) hours as needed for wheezing or shortness of breath.  [name] albuterol (VENTOLIN HFA) 108 (90 Base) MCG/ACT inhaler INHALE 2 PUFFS BY MOUTH EVERY 4 HOURS AS NEEDED  . bictegravir-emtricitabine-tenofovir AF (BIKTARVY) 50-200-25 MG TABS tablet Take 1 tablet by mouth daily.  . Blood Pressure Monitoring (BLOOD PRESSURE CUFF) MISC 1 Device by Does not apply route daily. DX: I10  . budesonide-formoterol (SYMBICORT) 160-4.5 MCG/ACT inhaler Inhale 2 puffs into the lungs in the morning and at bedtime.  . Calcium Carbonate (CALCIUM 600 PO) Take by mouth daily.  . enalapril-hydrochlorothiazide (VASERETIC) 10-25 MG tablet TAKE 1 TABLET BY MOUTH EVERY DAY  . metoprolol tartrate (LOPRESSOR) 100 MG tablet TAKE 1 TABLET BY MOUTH EVERY DAY  . montelukast (SINGULAIR) 10 MG tablet TAKE 1 TABLET BY MOUTH EVERYDAY AT BEDTIME  . Multiple Vitamins-Minerals (MULTIVITAMIN GUMMIES ADULT PO) Take 2 tablets by mouth daily. Gummy  . Omega-3 Fatty Acids (FISH OIL) 1000 MG CAPS Take by mouth.  Vladimir Faster Glycol-Propyl Glycol (SYSTANE HYDRATION PF OP) Apply to eye.  . pravastatin (PRAVACHOL) 20 MG tablet TAKE 1 TABLET BY MOUTH EVERY DAY  . Saccharomyces boulardii (PROBIOTIC) 250 MG CAPS By mouth daily  .  VITAMIN D PO Take 1 capsule by mouth daily.  . Wheat Dextrin (BENEFIBER DRINK MIX) PACK Daily for constipation   No facility-administered encounter medications on file as of 01/17/2020.     Patient Active Problem List   Diagnosis Date Noted  . Seasonal allergies 01/13/2018  . Asthma 02/05/2017  . Allergy 12/16/2016  . 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 Essential hypertension 08/19/2016  . Human immunodeficiency virus (HIV) disease ([place]) 08/19/2016     Health Maintenance Due  Topic Date Due  . MAMMOGRAM  05/12/2019     Review of Systems  Constitutional: Negative for fever, chills, diaphoresis, activity change, appetite change, fatigue and unexpected weight change.  HENT: Negative for congestion, sore throat, rhinorrhea, sneezing, trouble swallowing and sinus pressure.  Eyes: Negative for photophobia and visual disturbance.  Respiratory: Negative for cough, chest tightness, shortness of breath, wheezing and stridor.  Cardiovascular: Negative for chest pain, palpitations and leg swelling.  Gastrointestinal: Negative for nausea, vomiting, abdominal pain, diarrhea, constipation, blood in stool, abdominal distention and anal bleeding.  Genitourinary: Negative for dysuria, hematuria, flank pain and difficulty urinating.  Musculoskeletal: Negative for myalgias, back pain, joint swelling, arthralgias and gait problem.  Skin: Negative for color change, pallor, rash and wound.  Neurological: Negative for dizziness, tremors, weakness and light-headedness.  Hematological: Negative for adenopathy. Does not bruise/bleed easily.  Psychiatric/Behavioral: Negative for behavioral problems, confusion, sleep disturbance, dysphoric mood, decreased concentration and agitation.    Physical Exam   BP 133/78   Pulse 62   Temp 98.2 F (  36.8 C) (Oral)   Ht 5\' 8"  (1.727 m)   Wt 170 lb (77.1 kg)   SpO2 99%   BMI 25.85 kg/m   Physical Exam  Constitutional:  oriented to person, place, and time.  appears well-developed and well-nourished. No distress.  HENT: [place]/AT, PERRLA, no scleral icterus Mouth/Throat: Oropharynx is clear and moist. No oropharyngeal exudate.  Cardiovascular: Normal rate, regular rhythm and normal heart sounds. Exam reveals no gallop and no friction rub.  No murmur heard.  Pulmonary/Chest: Effort normal and breath sounds normal. No respiratory distress.  has no wheezes.  Neck = supple, no nuchal rigidity Abdominal: Soft. Bowel sounds are normal.  exhibits no distension. There is no tenderness.  Lymphadenopathy: no cervical adenopathy. No axillary adenopathy Neurological: alert and oriented to person, place, and time.  Skin: Skin is warm and dry. No rash noted. No erythema.  Psychiatric: a normal mood and affect.  behavior is normal.   Lab Results  Component Value Date   CD4TCELL 14 (L) 01/02/2020   Lab Results  Component Value Date   CD4TABS 465 01/02/2020   CD4TABS 597 04/05/2019   CD4TABS 500 08/25/2018   Lab Results  Component Value Date   HIV1RNAQUANT <20 NOT DETECTED 01/02/2020   Lab Results  Component Value Date   HEPBSAB POS (A) 09/24/2016   Lab Results  Component Value Date   LABRPR NON-REACTIVE 01/02/2020    CBC Lab Results  Component Value Date   WBC 6.4 01/08/2020   RBC 4.46 01/08/2020   HGB 13.5 01/08/2020   HCT 41.1 01/08/2020   PLT 251 01/08/2020   MCV 92.2 01/08/2020   MCH 30.3 01/08/2020   MCHC 32.8 01/08/2020   RDW 13.1 01/08/2020   LYMPHSABS 3,046 01/08/2020   MONOABS 1.0 12/27/2019   EOSABS 294 01/08/2020    BMET Lab Results  Component Value Date   NA 140 01/08/2020   K 3.6 01/08/2020   CL 102 01/08/2020   CO2 32 01/08/2020   GLUCOSE 99 01/08/2020   BUN 13 01/08/2020   CREATININE 0.95 (H) 01/08/2020   CALCIUM 9.2 01/08/2020   GFRNONAA 61 01/08/2020   GFRAA 70 01/08/2020    Assessment and Plan  HIV disease= continue biktarvy; well controlled  Health maintenance = will do mammogram this year then Q2hr  til 69yo. Fully vaccinated.  Asthma = continue with inhaler regimens. Stable presently.

## 2020-01-17 NOTE — Patient Instructions (Signed)
Eye drops  Can also use - zaditor

## 2020-01-19 ENCOUNTER — Other Ambulatory Visit: Payer: Self-pay | Admitting: Nurse Practitioner

## 2020-01-30 ENCOUNTER — Ambulatory Visit (INDEPENDENT_AMBULATORY_CARE_PROVIDER_SITE_OTHER): Payer: 59 | Admitting: Family

## 2020-01-30 ENCOUNTER — Other Ambulatory Visit: Payer: Self-pay

## 2020-01-30 ENCOUNTER — Encounter: Payer: Self-pay | Admitting: Family

## 2020-01-30 VITALS — BP 150/90 | HR 79 | Temp 97.3°F | Resp 16 | Ht 68.0 in | Wt 176.0 lb

## 2020-01-30 DIAGNOSIS — H6123 Impacted cerumen, bilateral: Secondary | ICD-10-CM | POA: Diagnosis not present

## 2020-01-30 DIAGNOSIS — I10 Essential (primary) hypertension: Secondary | ICD-10-CM

## 2020-01-30 DIAGNOSIS — R0982 Postnasal drip: Secondary | ICD-10-CM | POA: Diagnosis not present

## 2020-01-30 DIAGNOSIS — J4531 Mild persistent asthma with (acute) exacerbation: Secondary | ICD-10-CM

## 2020-01-30 MED ORDER — FLUTICASONE PROPIONATE 50 MCG/ACT NA SUSP: 2.0000 | Freq: Every day | NASAL | 6 refills | Status: DC

## 2020-01-30 MED ORDER — PREDNISONE 10 MG PO TABS: ORAL_TABLET | ORAL | 0 refills | Status: DC

## 2020-01-30 MED ORDER — DEBROX 6.5 % OT SOLN: 5.0000 [drp] | Freq: Two times a day (BID) | OTIC | 0 refills | Status: DC

## 2020-01-30 MED FILL — DEBROX 6.5 % SOLN: 6.5 | 30 days supply | Qty: 15 | Fill #0

## 2020-01-30 MED FILL — predniSONE 10 MG TABS: 10 | 4 days supply | Qty: 10 | Fill #0

## 2020-01-30 MED FILL — FLUTICASONE PROP 50 MCG SPR: 50 | 30 days supply | Qty: 16 | Fill #0

## 2020-01-30 NOTE — Progress Notes (Signed)
Provider: Eleanora Guinyard FNP-C  Lauree Chandler, NP  Patient Care Team: Lauree Chandler, NP as PCP - General (Geriatric Medicine)  Extended Emergency Contact Information Primary Emergency Contact: Nottingham,Rushdan Address: [address]          [place], [place] [address] Johnnette Litter of [place] Phone: [phone] Mobile Phone: [phone] Relation: Son Secondary Emergency Contact: [facility] Address: [address]          [place], [place] [address] Johnnette Litter of [place] Phone: [phone] Relation: Spouse  Code Status: Full code  Goals of care: Advanced Directive information Advanced Directives 01/30/2020  Does Patient Have a Medical Advance Directive? No  Type of Advance Directive -  Does patient want to make changes to medical advance directive? No - Patient declined  Copy of [place] in Chart? -  Would patient like information on creating a medical advance directive? -     Chief Complaint  Patient presents with  . Acute Visit    Problems with asthma.    HPI:  Pt is a 69 y.o. female seen today for an acute visit for evaluation of shortness of breath,wheezing and scratchy throat x 1 day.she states had sinus drip and scratchy throat yesterday which made cough.Her ears hurt yesterday too.she started coughing and wheezing.she used her Albuterol Nebulizer twice.Has used symbicort which relieved some.Her wheezing and shortness of breath worsen when she walks.Her Peak flow is down to 200 usually it's in the 300's.she denies any fever,chills,loss of taste,smell,chest pain or palpitation.No exposure to person sick with COVID-19.No one sick at home with cold symptoms.she has completed her COVID-19 vaccine.  B/p 130's/80's at home .Has not take her B/p medication today.B/p high on arrival but improved after resting few minutes.    Past Medical History:  Diagnosis Date  . Asthma   . High blood pressure   . HIV (human immunodeficiency virus  infection) ([place])   . Osteoarthritis    Past Surgical History:  Procedure Laterality Date  . Biopsy of Liver    . CATARACT EXTRACTION, BILATERAL    . CHOLECYSTECTOMY    . CYST REMOVAL HAND Left   . LAPAROSCOPIC SALPINGOOPHERECTOMY Right     Allergies  Allergen Reactions  . No [employer] Food Allergies     Seasonal Allergies     Outpatient Encounter Medications as of 01/30/2020  Medication Sig  . albuterol (PROVENTIL) (2.5 MG/3ML) 0.083% nebulizer solution Take 3 mLs (2.5 mg total) by nebulization every 4 (four) hours as needed for wheezing or shortness of breath.  [name] albuterol (VENTOLIN HFA) 108 (90 Base) MCG/ACT inhaler INHALE 2 PUFFS BY MOUTH EVERY 4 HOURS AS NEEDED  . bictegravir-emtricitabine-tenofovir AF (BIKTARVY) 50-200-25 MG TABS tablet Take 1 tablet by mouth daily.  . Blood Pressure Monitoring (BLOOD PRESSURE CUFF) MISC 1 Device by Does not apply route daily. DX: I10  . budesonide-formoterol (SYMBICORT) 160-4.5 MCG/ACT inhaler Inhale 2 puffs into the lungs in the morning and at bedtime.  . Calcium Carbonate (CALCIUM 600 PO) Take by mouth daily.  . enalapril-hydrochlorothiazide (VASERETIC) 10-25 MG tablet TAKE 1 TABLET BY MOUTH EVERY DAY  . metoprolol tartrate (LOPRESSOR) 100 MG tablet TAKE 1 TABLET BY MOUTH EVERY DAY  . montelukast (SINGULAIR) 10 MG tablet TAKE 1 TABLET BY MOUTH EVERYDAY AT BEDTIME  . Multiple Vitamins-Minerals (MULTIVITAMIN GUMMIES ADULT PO) Take 2 tablets by mouth daily. Gummy  . Omega-3 Fatty Acids (FISH OIL) 1000 MG CAPS Take by mouth.  Vladimir Faster Glycol-Propyl Glycol (SYSTANE HYDRATION PF  OP) Apply to eye.  . pravastatin (PRAVACHOL) 20 MG tablet TAKE 1 TABLET BY MOUTH EVERY DAY  . Saccharomyces boulardii (PROBIOTIC) 250 MG CAPS By mouth daily  . VITAMIN D PO Take 1 capsule by mouth daily.  . Wheat Dextrin (BENEFIBER DRINK MIX) PACK Daily for constipation  . carbamide peroxide (DEBROX) 6.5 % OTIC solution Place 5 drops into both ears 2 (two) times  daily for 4 days.  . fluticasone (FLONASE) 50 MCG/ACT nasal spray Place 2 sprays into both nostrils daily.  . predniSONE (DELTASONE) 10 MG tablet Take 40 mg ( 4 tablets) by mouth x 1 day then  Take 30 mg (3 tablets ) by mouth x 1 day then  Take 20 Mg ( 2 Tablets ) By mouth x 1 day then  Take 10 mg ( 1 Tablet )  by mouth and stop.   No facility-administered encounter medications on file as of 01/30/2020.    Review of Systems  Constitutional: Negative for appetite change and chills.  HENT: Positive for postnasal drip and sore throat. Negative for congestion, rhinorrhea, sinus pressure, sinus pain, sneezing and trouble swallowing.   Eyes: Negative for discharge, redness, itching and visual disturbance.  Respiratory: Positive for cough and wheezing. Negative for chest tightness.   Cardiovascular: Negative for chest pain, palpitations and leg swelling.  Gastrointestinal: Negative for abdominal distention, abdominal pain, constipation, diarrhea, nausea and vomiting.  Skin: Negative for color change, pallor and rash.  Neurological: Negative for dizziness, speech difficulty, light-headedness and headaches.  Psychiatric/Behavioral:       Didn't sleep well due to cough and wheezing     Immunization History  Administered Date(s) Administered  . Fluad Quad(high Dose 65+) 05/16/2019  . Influenza,inj,Quad PF,6+ Mos 06/17/2017, 06/09/2018  . Influenza-Unspecified 06/11/2016  . PFIZER SARS-COV-2 Vaccination 10/20/2019, 11/14/2019  . Pneumococcal Conjugate-13 05/20/2018  . Pneumococcal Polysaccharide-23 02/15/2017  . Tdap 09/21/2017  . Zoster Recombinat (Shingrix) 07/19/2018   Pertinent  Health Maintenance Due  Topic Date Due  . MAMMOGRAM  05/12/2019  . INFLUENZA VACCINE  04/14/2020  . COLONOSCOPY  11/03/2023  . DEXA SCAN  Completed  . PNA vac Low Risk Adult  Completed   Fall Risk  01/30/2020 01/12/2020 12/22/2019 10/09/2019 07/14/2019  Falls in the past year? 0 0 0 1 0  Number falls in past yr:  0 0 0 0 0  Injury with Fall? 0 0 0 0 0  Follow up - - - Falls evaluation completed -    Vitals:   01/30/20 0909 01/30/20 0944  BP: (!) 150/100 (!) 150/90  Pulse: 79   Resp: 16   Temp: (!) 97.3 F (36.3 C)   SpO2: 97%   Weight: 176 lb (79.8 kg)   Height: 5\' 8"  (1.727 m)    Body mass index is 26.76 kg/m. Physical Exam Vitals reviewed.  Constitutional:      General: She is not in acute distress.    Appearance: She is not ill-appearing.  HENT:     Head: Normocephalic.     Right Ear: There is impacted cerumen.     Left Ear: There is impacted cerumen.     Nose: No nasal tenderness or congestion.     Right Turbinates: Not enlarged, swollen or pale.     Left Turbinates: Swollen. Not pale.     Right Sinus: No maxillary sinus tenderness or frontal sinus tenderness.     Left Sinus: No maxillary sinus tenderness or frontal sinus tenderness.     Mouth/Throat:  Mouth: Mucous membranes are moist.     Pharynx: Oropharynx is clear. No oropharyngeal exudate or posterior oropharyngeal erythema.  Eyes:     General: No scleral icterus.       Right eye: No discharge.        Left eye: No discharge.     Extraocular Movements: Extraocular movements intact.     Conjunctiva/sclera: Conjunctivae normal.     Pupils: Pupils are equal, round, and reactive to light.  Cardiovascular:     Rate and Rhythm: Normal rate and regular rhythm.     Pulses: Normal pulses.     Heart sounds: Normal heart sounds. No murmur. No friction rub. No gallop.   Pulmonary:     Effort: Pulmonary effort is normal. No respiratory distress.     Breath sounds: Normal breath sounds. No wheezing, rhonchi or rales.  Chest:     Chest wall: No tenderness.  Abdominal:     General: Bowel sounds are normal.     Palpations: Abdomen is soft. There is no mass.     Tenderness: There is no abdominal tenderness. There is no right CVA tenderness, left CVA tenderness, guarding or rebound.  Musculoskeletal:        General: No  swelling or tenderness. Normal range of motion.     Left lower leg: No edema.  Skin:    General: Skin is warm.     Coloration: Skin is not pale.     Findings: No erythema or rash.  Neurological:     Mental Status: She is alert and oriented to person, place, and time.     Cranial Nerves: No cranial nerve deficit.     Motor: No weakness.     Gait: Gait normal.  Psychiatric:        Mood and Affect: Mood normal.        Behavior: Behavior normal.        Thought Content: Thought content normal.        Judgment: Judgment normal.     Labs reviewed: Recent Labs    12/27/19 2254 01/02/20 1049 01/08/20 0842  NA 138 141 140  K 3.1* 3.8 3.6  CL 99 101 102  CO2 30 34* 32  GLUCOSE 137* 83 99  BUN 15 20 13   CREATININE 0.94 0.98* 0.95*  CALCIUM 10.0 10.0 9.2   Recent Labs    04/05/19 0918 01/02/20 1049 01/08/20 0842  AST 32 20 18  ALT 32* 28 25  BILITOT 1.1 0.8 1.0  PROT 6.6 6.4 6.2   Recent Labs    12/27/19 2254 01/02/20 1049 01/08/20 0842  WBC 10.4 13.2* 6.4  NEUTROABS 4.9 8,884* 2,317  HGB 14.6 13.9 13.5  HCT 43.9 41.7 41.1  MCV 92.2 91.9 92.2  PLT 286 301 251   Lab Results  Component Value Date   TSH 1.43 08/16/2017   No results found for: HGBA1C Lab Results  Component Value Date   CHOL 141 01/08/2020   HDL 44 (L) 01/08/2020   LDLCALC 78 01/08/2020   TRIG 102 01/08/2020   CHOLHDL 3.2 01/08/2020    Significant Diagnostic Results in last 30 days:  No results found.  Assessment/Plan 1. Mild persistent asthma with acute exacerbation Afebrile. Worsening cough,wheezing and shortness of breath.Has had Post nasal drip that worsen cough.bilateral expiratory wheezing noted on exam.No antibiotics indicated.  - Advised to continue on Albuterol and Symbicort - Discussed tapered prednisone for exacerbation in agreement.Has used Prednisone in the past with relief.  - predniSONE (  DELTASONE) 10 MG tablet; Take 40 mg ( 4 tablets) by mouth x 1 day then  Take 30 mg (3  tablets ) by mouth x 1 day then  Take 20 Mg ( 2 Tablets ) By mouth x 1 day then  Take 10 mg ( 1 Tablet )  by mouth and stop.  Dispense: 10 tablet; Refill: 0  2. PND (post-nasal drip) Nasal drip irritating her throat.swollen turbinates left > right.Advised to use humidify at night.will add Flonase nasal spray as below.  - fluticasone (FLONASE) 50 MCG/ACT nasal spray; Place 2 sprays into both nostrils daily.  Dispense: 16 g; Refill: 6  3. Bilateral impacted cerumen TM not visualized.Recommended lavaged but since not feeling well will have use debrox as below then return for ear lavage.  - carbamide peroxide (DEBROX) 6.5 % OTIC solution; Place 5 drops into both ears 2 (two) times daily for 4 days.  Dispense: 15 mL; Refill: 0  4. Hypertension B/p high on arrival but rechecked after resting reading much better.Asymptomatic.Has not taken her blood pressure medication this morning but will take them after visit.encouraged to check blood pressure at home and notify provider if readings remains > 140/90  - continue on current B/p medication.   Family/ staff Communication: Reviewed plan of care with patient verbalized understanding.   Labs/tests ordered: None   Next Appointment: 6 days for bilateral ear cerumen lavage   Sandrea Hughs, NP

## 2020-01-30 NOTE — Patient Instructions (Signed)
-   Prednisone 10 mg tablet - Take 40 mg ( 4 tablets) by mouth x 1 day then  Take 30 mg (3 tablets ) by mouth x 1 day then  Take 20 Mg ( 2 Tablets ) By mouth x 1 day then  Take 10 mg ( 1 Tablet )  by mouth and stop. - Continue with Albuterol for wheezing  - Debrox 6.5 % otic solution instil 5 drops into each ear twice daily x 4 days then follow up for ear lavage at the office  - Notify provider or go to ED if symptoms worsen or running a fever > 100.5

## 2020-02-02 ENCOUNTER — Emergency Department (HOSPITAL_BASED_OUTPATIENT_CLINIC_OR_DEPARTMENT_OTHER): Payer: 59

## 2020-02-02 ENCOUNTER — Inpatient Hospital Stay (HOSPITAL_BASED_OUTPATIENT_CLINIC_OR_DEPARTMENT_OTHER)
Admission: EM | Admit: 2020-02-02 | Discharge: 2020-02-04 | DRG: 190 | Disposition: A | Discharge status: Home / Self Care | Payer: 59 | Attending: Internal Medicine | Admitting: Internal Medicine

## 2020-02-02 ENCOUNTER — Other Ambulatory Visit: Payer: Self-pay

## 2020-02-02 DIAGNOSIS — Z825 Family history of asthma and other chronic lower respiratory diseases: Secondary | ICD-10-CM

## 2020-02-02 DIAGNOSIS — J9601 Acute respiratory failure with hypoxia: Secondary | ICD-10-CM | POA: Diagnosis present

## 2020-02-02 DIAGNOSIS — J209 Acute bronchitis, unspecified: Secondary | ICD-10-CM | POA: Diagnosis present

## 2020-02-02 DIAGNOSIS — Z21 Asymptomatic human immunodeficiency virus [HIV] infection status: Secondary | ICD-10-CM | POA: Diagnosis present

## 2020-02-02 DIAGNOSIS — J441 Chronic obstructive pulmonary disease with (acute) exacerbation: Principal | ICD-10-CM | POA: Diagnosis present

## 2020-02-02 DIAGNOSIS — Z7952 Long term (current) use of systemic steroids: Secondary | ICD-10-CM | POA: Diagnosis not present

## 2020-02-02 DIAGNOSIS — Z79899 Other long term (current) drug therapy: Secondary | ICD-10-CM

## 2020-02-02 DIAGNOSIS — Z20822 Contact with and (suspected) exposure to covid-19: Secondary | ICD-10-CM | POA: Diagnosis present

## 2020-02-02 DIAGNOSIS — J440 Chronic obstructive pulmonary disease with acute lower respiratory infection: Secondary | ICD-10-CM | POA: Diagnosis present

## 2020-02-02 DIAGNOSIS — Z9841 Cataract extraction status, right eye: Secondary | ICD-10-CM

## 2020-02-02 DIAGNOSIS — Z9842 Cataract extraction status, left eye: Secondary | ICD-10-CM | POA: Diagnosis not present

## 2020-02-02 DIAGNOSIS — Z7951 Long term (current) use of inhaled steroids: Secondary | ICD-10-CM

## 2020-02-02 DIAGNOSIS — I10 Essential (primary) hypertension: Secondary | ICD-10-CM | POA: Diagnosis not present

## 2020-02-02 DIAGNOSIS — J45901 Unspecified asthma with (acute) exacerbation: Secondary | ICD-10-CM | POA: Diagnosis not present

## 2020-02-02 DIAGNOSIS — Z90721 Acquired absence of ovaries, unilateral: Secondary | ICD-10-CM | POA: Diagnosis not present

## 2020-02-02 DIAGNOSIS — Z8249 Family history of ischemic heart disease and other diseases of the circulatory system: Secondary | ICD-10-CM

## 2020-02-02 DIAGNOSIS — R0602 Shortness of breath: Secondary | ICD-10-CM | POA: Diagnosis not present

## 2020-02-02 DIAGNOSIS — M1990 Unspecified osteoarthritis, unspecified site: Secondary | ICD-10-CM | POA: Diagnosis present

## 2020-02-02 DIAGNOSIS — Z9049 Acquired absence of other specified parts of digestive tract: Secondary | ICD-10-CM | POA: Diagnosis not present

## 2020-02-02 DIAGNOSIS — Z8261 Family history of arthritis: Secondary | ICD-10-CM

## 2020-02-02 DIAGNOSIS — J45902 Unspecified asthma with status asthmaticus: Secondary | ICD-10-CM | POA: Diagnosis not present

## 2020-02-02 DIAGNOSIS — Z833 Family history of diabetes mellitus: Secondary | ICD-10-CM

## 2020-02-02 DIAGNOSIS — B20 Human immunodeficiency virus [HIV] disease: Secondary | ICD-10-CM | POA: Diagnosis present

## 2020-02-02 LAB — CBC
HCT: 43.2 % (ref 36.0–46.0)
Hemoglobin: 14.8 g/dL (ref 12.0–15.0)
MCH: 31.6 pg (ref 26.0–34.0)
MCHC: 34.3 g/dL (ref 30.0–36.0)
MCV: 92.1 fL (ref 80.0–100.0)
Platelets: 274 10*3/uL (ref 150–400)
RBC: 4.69 MIL/uL (ref 3.87–5.11)
RDW: 13.4 % (ref 11.5–15.5)
WBC: 8.8 10*3/uL (ref 4.0–10.5)
nRBC: 0 % (ref 0.0–0.2)

## 2020-02-02 LAB — BASIC METABOLIC PANEL
Anion gap: 10 (ref 5–15)
BUN: 19 mg/dL (ref 8–23)
CO2: 29 mmol/L (ref 22–32)
Calcium: 9.5 mg/dL (ref 8.9–10.3)
Chloride: 101 mmol/L (ref 98–111)
Creatinine, Ser: 0.99 mg/dL (ref 0.44–1.00)
GFR calc Af Amer: >60 mL/min (ref >60)
GFR calc non Af Amer: 58 mL/min — ABNORMAL LOW (ref >60)
Glucose, Bld: 105 mg/dL — ABNORMAL HIGH (ref 70–99)
Potassium: 3.7 mmol/L (ref 3.5–5.1)
Sodium: 140 mmol/L (ref 135–145)

## 2020-02-02 MED ORDER — ALBUTEROL (5 MG/ML) CONTINUOUS INHALATION SOLN
10.0000 mg/h | INHALATION_SOLUTION | RESPIRATORY_TRACT | Status: DC
  Administered 2020-02-03: 10 mg/h via RESPIRATORY_TRACT
  Filled 2020-02-02: qty 20

## 2020-02-02 MED ORDER — ALBUTEROL (5 MG/ML) CONTINUOUS INHALATION SOLN
10.0000 mg/h | INHALATION_SOLUTION | RESPIRATORY_TRACT | Status: DC
  Administered 2020-02-02 (×2): 10 mg/h via RESPIRATORY_TRACT
  Filled 2020-02-02: qty 20

## 2020-02-02 MED ORDER — IPRATROPIUM-ALBUTEROL 0.5-2.5 (3) MG/3ML IN SOLN
3.0000 mL | Freq: Once | RESPIRATORY_TRACT | Status: AC
  Administered 2020-02-02: 3 mL via RESPIRATORY_TRACT

## 2020-02-02 MED ORDER — ALBUTEROL SULFATE (2.5 MG/3ML) 0.083% IN NEBU
2.5000 mg | INHALATION_SOLUTION | Freq: Once | RESPIRATORY_TRACT | Status: AC
  Administered 2020-02-02: 2.5 mg via RESPIRATORY_TRACT

## 2020-02-02 MED ORDER — IPRATROPIUM-ALBUTEROL 0.5-2.5 (3) MG/3ML IN SOLN
RESPIRATORY_TRACT | Status: AC
  Filled 2020-02-02: qty 3

## 2020-02-02 MED ORDER — ALBUTEROL (5 MG/ML) CONTINUOUS INHALATION SOLN: 10.0000 mg/h | INHALATION_SOLUTION | RESPIRATORY_TRACT | Status: AC

## 2020-02-02 MED ORDER — ALBUTEROL SULFATE (2.5 MG/3ML) 0.083% IN NEBU
INHALATION_SOLUTION | RESPIRATORY_TRACT | Status: AC
  Filled 2020-02-02: qty 3

## 2020-02-02 MED ORDER — METHYLPREDNISOLONE SODIUM SUCC 125 MG IJ SOLR
125.0000 mg | Freq: Once | INTRAMUSCULAR | Status: AC
  Administered 2020-02-02: 125 mg via INTRAVENOUS
  Filled 2020-02-02: qty 2

## 2020-02-02 MED ORDER — MAGNESIUM SULFATE 2 GM/50ML IV SOLN
2.0000 g | Freq: Once | INTRAVENOUS | Status: AC
  Administered 2020-02-02: 2 g via INTRAVENOUS
  Filled 2020-02-02: qty 50

## 2020-02-02 NOTE — ED Triage Notes (Signed)
Pt c/o SOB x 1 week-seen by PCP 2 days ago-states nebs/inhalers not working-speaking in short phrases-tripoding

## 2020-02-02 NOTE — ED Provider Notes (Signed)
[name] Provider Note   CSN: [id number] Arrival date & time: 02/02/20  2111     History Chief Complaint  Patient presents with  . Shortness of Breath    Maria Adkins is a 69 y.o. female.  HPI Patient presents with shortness of breath.  Has had for the better part of this week.  Wheezing.  Has had a cough with some yellow sputum production.  No fevers.  Has been using nebulizer at home without real relief.  Saw PCP and was given prednisone.  Given 4-day taper with 40, 30, 20, and then 10 mg.  States she has now finished that.  States continues to feel short of breath.  No chest pain.  States the breathing is gotten worse. She has had her Covid vaccinations.  Past Medical History:  Diagnosis Date  . Asthma   . High blood pressure   . HIV (human immunodeficiency virus infection) ([place])   . Osteoarthritis     Patient Active Problem List   Diagnosis Date Noted  . Seasonal allergies 01/13/2018  . Asthma 02/05/2017  . Allergy 12/16/2016  . 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 Essential hypertension 08/19/2016  . Human immunodeficiency virus (HIV) disease ([place]) 08/19/2016    Past Surgical History:  Procedure Laterality Date  . Biopsy of Liver    . CATARACT EXTRACTION, BILATERAL    . CHOLECYSTECTOMY    . CYST REMOVAL HAND Left   . LAPAROSCOPIC SALPINGOOPHERECTOMY Right      OB History   No obstetric history on file.     Family History  Problem Relation Age of Onset  . Diabetes Mother        died at age 38  . Hypertension Mother   . Asthma Sister   . Diabetes Sister   . Hypertension Sister   . Arthritis Sister   . Diabetes Sister   . Hypertension Sister   . Asthma Sister   . Arthritis Sister   . Asthma Son        controlled    Social History   Tobacco Use  . Smoking status: Former Smoker    Packs/day: 1.00    Years: 40.00    Pack years: 40.00    Types: Cigarettes    Quit date: 09/14/1978    Years since quitting:  41.4  . Smokeless tobacco: Never Used  Substance Use Topics  . Alcohol use: No  . Drug use: No    Home Medications Prior to Admission medications   Medication Sig Start Date End Date Taking? Authorizing Provider  albuterol (PROVENTIL) (2.5 MG/3ML) 0.083% nebulizer solution Take 3 mLs (2.5 mg total) by nebulization every 4 (four) hours as needed for wheezing or shortness of breath. 12/22/19   Lauree Chandler, NP  albuterol (VENTOLIN HFA) 108 (90 Base) MCG/ACT inhaler INHALE 2 PUFFS BY MOUTH EVERY 4 HOURS AS NEEDED 05/23/19   Lauree Chandler, NP  bictegravir-emtricitabine-tenofovir AF (BIKTARVY) 50-200-25 MG TABS tablet Take 1 tablet by mouth daily. 12/27/19   Kuppelweiser, Cassie L, RPH-CPP  Blood Pressure Monitoring (BLOOD PRESSURE CUFF) MISC 1 Device by Does not apply route daily. DX: I10 07/14/19   Lauree Chandler, NP  budesonide-formoterol (SYMBICORT) 160-4.5 MCG/ACT inhaler Inhale 2 puffs into the lungs in the morning and at bedtime. 01/12/20   Lauree Chandler, NP  Calcium Carbonate (CALCIUM 600 PO) Take by mouth daily.    [provider]  carbamide peroxide (DEBROX) 6.5 % OTIC solution Place 5  drops into both ears 2 (two) times daily for 4 days. 01/30/20 02/03/20  Ngetich, Dinah C, NP  enalapril-hydrochlorothiazide (VASERETIC) 10-25 MG tablet TAKE 1 TABLET BY MOUTH EVERY DAY 08/14/19   Lauree Chandler, NP  fluticasone (FLONASE) 50 MCG/ACT nasal spray Place 2 sprays into both nostrils daily. 01/30/20   Ngetich, Dinah C, NP  metoprolol tartrate (LOPRESSOR) 100 MG tablet TAKE 1 TABLET BY MOUTH EVERY DAY 01/19/20   Lauree Chandler, NP  montelukast (SINGULAIR) 10 MG tablet TAKE 1 TABLET BY MOUTH EVERYDAY AT BEDTIME 06/30/19   Lauree Chandler, NP  Multiple Vitamins-Minerals (MULTIVITAMIN GUMMIES ADULT PO) Take 2 tablets by mouth daily. Gummy    [provider]  Omega-3 Fatty Acids (FISH OIL) 1000 MG CAPS Take by mouth.    [provider]  Polyethyl  Glycol-Propyl Glycol (SYSTANE HYDRATION PF OP) Apply to eye.    [provider]  pravastatin (PRAVACHOL) 20 MG tablet TAKE 1 TABLET BY MOUTH EVERY DAY 08/29/19   Lauree Chandler, NP  predniSONE (DELTASONE) 10 MG tablet Take 40 mg ( 4 tablets) by mouth x 1 day then  Take 30 mg (3 tablets ) by mouth x 1 day then  Take 20 Mg ( 2 Tablets ) By mouth x 1 day then  Take 10 mg ( 1 Tablet )  by mouth and stop. 01/30/20   Ngetich, Dinah C, NP  Saccharomyces boulardii (PROBIOTIC) 250 MG CAPS By mouth daily 01/12/20   Lauree Chandler, NP  VITAMIN D PO Take 1 capsule by mouth daily.    [provider]  Wheat Dextrin (BENEFIBER DRINK MIX) PACK Daily for constipation 01/12/20   Lauree Chandler, NP    Allergies    Patient has no known allergies.  Review of Systems   Review of Systems  Constitutional: Negative for appetite change.  HENT: Negative for congestion.   Respiratory: Positive for cough, shortness of breath and wheezing.   Cardiovascular: Negative for chest pain.  Gastrointestinal: Negative for abdominal pain.  Genitourinary: Negative for flank pain.  Musculoskeletal: Negative for back pain.  Skin: Negative for rash.  Neurological: Negative for weakness.    Physical Exam Updated Vital Signs BP 137/76 (BP Location: Right Arm) Comment: Simultaneous filing. User may not have seen previous data.  Pulse 76 Comment: Simultaneous filing. User may not have seen previous data.  Temp 98.3 F (36.8 C) (Oral)   Resp 15 Comment: Simultaneous filing. User may not have seen previous data.  SpO2 90%   Physical Exam Cardiovascular:     Rate and Rhythm: Tachycardia present.  Pulmonary:     Breath sounds: Wheezing present.     Comments: Diffuse wheezes and prolonged expirations. Chest:     Chest wall: No tenderness.  Musculoskeletal:     Cervical back: Neck supple.     Left lower leg: No edema.  Skin:    General: Skin is warm.     Capillary Refill: Capillary refill takes  less than 2 seconds.  Neurological:     Mental Status: She is alert and oriented to person, place, and time.     ED Results / Procedures / Treatments   Labs (all labs ordered are listed, but only abnormal results are displayed) Labs Reviewed  BASIC METABOLIC PANEL - Abnormal; Notable for the following components:      Result Value   Glucose, Bld 105 (*)    GFR calc non Af Amer 58 (*)    All other components  within normal limits  SARS CORONAVIRUS 2 BY RT PCR (HOSPITAL ORDER, [facility])  CBC    EKG None  Radiology DG Chest Portable 1 View  Result Date: 02/02/2020 CLINICAL DATA:  Short of breath for 1 week, asthma EXAM: PORTABLE CHEST 1 VIEW COMPARISON:  12/27/2019 FINDINGS: Two frontal views of the chest demonstrate a stable cardiac silhouette. Minimal atherosclerosis of the aortic arch unchanged. No airspace disease, effusion, or pneumothorax. No acute bony abnormalities. IMPRESSION: 1. Stable exam, no acute process. Electronically Signed   By: Randa Ngo M.D.   On: 02/02/2020 22:35    Procedures Procedures (including critical care time)  Medications Ordered in ED Medications  albuterol (PROVENTIL,VENTOLIN) solution continuous neb (10 mg/hr Nebulization New Bag/Given 02/02/20 2247)  albuterol (PROVENTIL,VENTOLIN) solution continuous neb (has no administration in time range)  albuterol (PROVENTIL) (2.5 MG/3ML) 0.083% nebulizer solution 2.5 mg (2.5 mg Nebulization Given 02/02/20 2139)  ipratropium-albuterol (DUONEB) 0.5-2.5 (3) MG/3ML nebulizer solution 3 mL (3 mLs Nebulization Given 02/02/20 2140)  methylPREDNISolone sodium succinate (SOLU-MEDROL) 125 mg/2 mL injection 125 mg (125 mg Intravenous Given 02/02/20 2209)  magnesium sulfate IVPB 2 g 50 mL ( Intravenous Stopped 02/02/20 2229)    ED Course  I have reviewed the triage vital signs and the nursing notes.  Pertinent labs & imaging results that were available during my care of the patient were  reviewed by me and considered in my medical decision making (see chart for details).    MDM Rules/Calculators/A&P                      Patient presents with shortness of breath and wheezes.  Has had for about a week.  Saw PCP and was started on albuterol.  Continued worsening of symptoms.  Patient has a good x-ray here but has had continuous neb and continues to be tight.  Sats will go down to the upper 80s when she is at rest and not on the treatment.  I think with continued hypoxia patient benefit admission to the hospital.  Not in severe respiratory distress and patient asked does not want to be admitted but I feel it is required at this time.  Will discuss with hospitalist.  Patient would rather go to [facility] since her husband works at [employer], however there may not be available beds.  CRITICAL CARE Performed by: Davonna Belling Total critical care time: 30 minutes Critical care time was exclusive of separately billable procedures and treating other patients. Critical care was necessary to treat or prevent imminent or life-threatening deterioration. Critical care was time spent personally by me on the following activities: development of treatment plan with patient and/or surrogate as well as nursing, discussions with consultants, evaluation of patient's response to treatment, examination of patient, obtaining history from patient or surrogate, ordering and performing treatments and interventions, ordering and review of laboratory studies, ordering and review of radiographic studies, pulse oximetry and re-evaluation of patient's condition.  Final Clinical Impression(s) / ED Diagnoses Final diagnoses:  Asthma with status asthmaticus, unspecified asthma severity, unspecified whether persistent    Rx / DC Orders ED Discharge Orders    None       Davonna Belling, MD 02/02/20 2305

## 2020-02-03 ENCOUNTER — Encounter (HOSPITAL_COMMUNITY): Payer: Self-pay | Admitting: Internal Medicine

## 2020-02-03 DIAGNOSIS — I10 Essential (primary) hypertension: Secondary | ICD-10-CM

## 2020-02-03 DIAGNOSIS — J45901 Unspecified asthma with (acute) exacerbation: Secondary | ICD-10-CM | POA: Diagnosis present

## 2020-02-03 LAB — CBC WITH DIFFERENTIAL/PLATELET
Abs Immature Granulocytes: 0.09 K/uL — ABNORMAL HIGH (ref 0.00–0.07)
Basophils Absolute: 0.1 K/uL (ref 0.0–0.1)
Basophils Relative: 1 %
Eosinophils Absolute: 0 K/uL (ref 0.0–0.5)
Eosinophils Relative: 0 %
HCT: 46 % (ref 36.0–46.0)
Hemoglobin: 15.2 g/dL — ABNORMAL HIGH (ref 12.0–15.0)
Immature Granulocytes: 1 %
Lymphocytes Relative: 17 %
Lymphs Abs: 1.7 K/uL (ref 0.7–4.0)
MCH: 31.6 pg (ref 26.0–34.0)
MCHC: 33 g/dL (ref 30.0–36.0)
MCV: 95.6 fL (ref 80.0–100.0)
Monocytes Absolute: 0.2 K/uL (ref 0.1–1.0)
Monocytes Relative: 2 %
Neutro Abs: 8 K/uL — ABNORMAL HIGH (ref 1.7–7.7)
Neutrophils Relative %: 79 %
Platelets: 295 K/uL (ref 150–400)
RBC: 4.81 MIL/uL (ref 3.87–5.11)
RDW: 13.3 % (ref 11.5–15.5)
WBC: 10.1 K/uL (ref 4.0–10.5)
nRBC: 0 % (ref 0.0–0.2)

## 2020-02-03 LAB — COMPREHENSIVE METABOLIC PANEL WITH GFR
ALT: 27 U/L (ref 0–44)
AST: 31 U/L (ref 15–41)
Albumin: 4.2 g/dL (ref 3.5–5.0)
Alkaline Phosphatase: 46 U/L (ref 38–126)
Anion gap: 16 — ABNORMAL HIGH (ref 5–15)
BUN: 17 mg/dL (ref 8–23)
CO2: 24 mmol/L (ref 22–32)
Calcium: 9.4 mg/dL (ref 8.9–10.3)
Chloride: 101 mmol/L (ref 98–111)
Creatinine, Ser: 1.21 mg/dL — ABNORMAL HIGH (ref 0.44–1.00)
GFR calc Af Amer: 52 mL/min — ABNORMAL LOW (ref >60)
GFR calc non Af Amer: 45 mL/min — ABNORMAL LOW (ref >60)
Glucose, Bld: 195 mg/dL — ABNORMAL HIGH (ref 70–99)
Potassium: 2.9 mmol/L — ABNORMAL LOW (ref 3.5–5.1)
Sodium: 141 mmol/L (ref 135–145)
Total Bilirubin: 0.6 mg/dL (ref 0.3–1.2)
Total Protein: 7.5 g/dL (ref 6.5–8.1)

## 2020-02-03 LAB — MAGNESIUM: Magnesium: 2.3 mg/dL (ref 1.7–2.4)

## 2020-02-03 LAB — SARS CORONAVIRUS 2 BY RT PCR (HOSPITAL ORDER, PERFORMED IN ~~LOC~~ HOSPITAL LAB): SARS Coronavirus 2: NEGATIVE

## 2020-02-03 MED ORDER — HYDROCHLOROTHIAZIDE 25 MG PO TABS
25.0000 mg | ORAL_TABLET | Freq: Every day | ORAL | Status: DC
  Administered 2020-02-03 – 2020-02-04 (×2): 25 mg via ORAL
  Filled 2020-02-03 (×2): qty 1

## 2020-02-03 MED ORDER — LEVALBUTEROL HCL 0.63 MG/3ML IN NEBU: 0.6300 mg | INHALATION_SOLUTION | Freq: Four times a day (QID) | RESPIRATORY_TRACT | Status: DC | PRN

## 2020-02-03 MED ORDER — BUDESONIDE 0.25 MG/2ML IN SUSP
0.2500 mg | Freq: Two times a day (BID) | RESPIRATORY_TRACT | Status: DC
  Administered 2020-02-03 – 2020-02-04 (×3): 0.25 mg via RESPIRATORY_TRACT
  Filled 2020-02-03 (×3): qty 2

## 2020-02-03 MED ORDER — LEVALBUTEROL HCL 1.25 MG/0.5ML IN NEBU
1.2500 mg | INHALATION_SOLUTION | Freq: Four times a day (QID) | RESPIRATORY_TRACT | Status: DC
  Administered 2020-02-03 (×2): 1.25 mg via RESPIRATORY_TRACT
  Filled 2020-02-03 (×3): qty 0.5

## 2020-02-03 MED ORDER — LEVALBUTEROL HCL 1.25 MG/0.5ML IN NEBU: 1.2500 mg | INHALATION_SOLUTION | Freq: Four times a day (QID) | RESPIRATORY_TRACT | Status: DC

## 2020-02-03 MED ORDER — METHYLPREDNISOLONE SODIUM SUCC 125 MG IJ SOLR
60.0000 mg | Freq: Two times a day (BID) | INTRAMUSCULAR | Status: DC
  Administered 2020-02-03 – 2020-02-04 (×3): 60 mg via INTRAVENOUS
  Filled 2020-02-03 (×3): qty 2

## 2020-02-03 MED ORDER — ACETAMINOPHEN 325 MG PO TABS
650.0000 mg | ORAL_TABLET | Freq: Four times a day (QID) | ORAL | Status: DC | PRN
  Administered 2020-02-03: 650 mg via ORAL
  Filled 2020-02-03: qty 2

## 2020-02-03 MED ORDER — LEVALBUTEROL HCL 1.25 MG/0.5ML IN NEBU
INHALATION_SOLUTION | RESPIRATORY_TRACT | Status: AC
  Administered 2020-02-03: 1.25 mg via RESPIRATORY_TRACT
  Filled 2020-02-03: qty 0.5

## 2020-02-03 MED ORDER — PRAVASTATIN SODIUM 10 MG PO TABS
20.0000 mg | ORAL_TABLET | Freq: Every day | ORAL | Status: DC
  Administered 2020-02-03 – 2020-02-04 (×2): 20 mg via ORAL
  Filled 2020-02-03 (×2): qty 2

## 2020-02-03 MED ORDER — ENALAPRIL-HYDROCHLOROTHIAZIDE 10-25 MG PO TABS: 1.0000 | ORAL_TABLET | Freq: Every day | ORAL | Status: DC

## 2020-02-03 MED ORDER — METOPROLOL TARTRATE 100 MG PO TABS
100.0000 mg | ORAL_TABLET | Freq: Every day | ORAL | Status: DC
  Administered 2020-02-03 – 2020-02-04 (×2): 100 mg via ORAL
  Filled 2020-02-03 (×2): qty 1

## 2020-02-03 MED ORDER — ALPRAZOLAM 0.25 MG PO TABS
0.2500 mg | ORAL_TABLET | Freq: Once | ORAL | Status: AC
  Administered 2020-02-03: 0.25 mg via ORAL
  Filled 2020-02-03: qty 1

## 2020-02-03 MED ORDER — LEVALBUTEROL HCL 1.25 MG/0.5ML IN NEBU
1.2500 mg | INHALATION_SOLUTION | Freq: Three times a day (TID) | RESPIRATORY_TRACT | Status: DC
  Administered 2020-02-04: 1.25 mg via RESPIRATORY_TRACT
  Filled 2020-02-03 (×2): qty 0.5

## 2020-02-03 MED ORDER — BICTEGRAVIR-EMTRICITAB-TENOFOV 50-200-25 MG PO TABS
1.0000 | ORAL_TABLET | Freq: Every day | ORAL | Status: DC
  Administered 2020-02-03 – 2020-02-04 (×2): 1 via ORAL
  Filled 2020-02-03 (×2): qty 1

## 2020-02-03 MED ORDER — POTASSIUM CHLORIDE CRYS ER 20 MEQ PO TBCR
40.0000 meq | EXTENDED_RELEASE_TABLET | ORAL | Status: AC
  Administered 2020-02-03 (×2): 40 meq via ORAL
  Filled 2020-02-03 (×2): qty 2

## 2020-02-03 MED ORDER — ENOXAPARIN SODIUM 40 MG/0.4ML ~~LOC~~ SOLN
40.0000 mg | Freq: Every day | SUBCUTANEOUS | Status: DC
  Administered 2020-02-03: 40 mg via SUBCUTANEOUS
  Filled 2020-02-03: qty 0.4

## 2020-02-03 MED ORDER — ONDANSETRON HCL 4 MG PO TABS: 4.0000 mg | ORAL_TABLET | Freq: Four times a day (QID) | ORAL | Status: DC | PRN

## 2020-02-03 MED ORDER — ONDANSETRON HCL 4 MG/2ML IJ SOLN: 4.0000 mg | Freq: Four times a day (QID) | INTRAMUSCULAR | Status: DC | PRN

## 2020-02-03 MED ORDER — ENALAPRIL MALEATE 5 MG PO TABS
10.0000 mg | ORAL_TABLET | Freq: Every day | ORAL | Status: DC
  Administered 2020-02-03 – 2020-02-04 (×2): 10 mg via ORAL
  Filled 2020-02-03 (×2): qty 2

## 2020-02-03 MED ORDER — BUDESONIDE 0.25 MG/2ML IN SUSP: 0.2500 mg | Freq: Two times a day (BID) | RESPIRATORY_TRACT | Status: DC

## 2020-02-03 NOTE — Progress Notes (Signed)
PROGRESS NOTE  Maria Adkins [id number] DOB: 01/28/1951 DOA: 02/02/2020 PCP: Lauree Chandler, NP  Brief History   The patient is a 69 yr old woman who presented to [facility] on [facility]/02/2020 with increasing shortness of breath. She had had increasing wheezing and shortness of breath over the previous week. She denies fever, chills, productive cough, or chest pain. She saw no improvement despite steroid taper. Symptoms were not exertional.  Past medical history is significant for Asthma, HIV (most recent CD 4 is more than 400) and hypertension.   In the ED the patient was found to be hypoxic. She was admitted after she did not improved with steroids and multiple nebulizer treatments.  Consultants  . None  Procedures  . None  Antibiotics   Anti-infectives (From admission, onward)   Start     Dose/Rate Route Frequency Ordered Stop   02/03/20 1000  bictegravir-emtricitabine-tenofovir AF (BIKTARVY) 50-200-25 MG per tablet 1 tablet     1 tablet Oral Daily 02/03/20 0455      .  Subjective  The patient is sitting up in bed. She appears dyspneic at rest. She states that she is feeling better than last night.  Objective   Vitals:  Vitals:   02/03/20 0756 02/03/20 0808  BP: 118/79   Pulse: 91   Resp: 18   Temp: 97.8 F (36.6 C)   SpO2: 99% 98%   Exam:  Constitutional:  . The patient is awake, alert, and oriented x 3. No acute distress. Respiratory:  . No increased work of breathing. . No rales . No tactile fremitus . Positive for wheezes and rhonchi. . Diminished breath sounds bilateral Cardiovascular:  . Regular rate and rhythm . No murmurs, ectopy, or gallups. . No lateral PMI. No thrills. Abdomen:  . Abdomen is soft, non-tender, non-distended . No hernias, masses, or organomegaly . Normoactive bowel sounds.  Musculoskeletal:  . No cyanosis, clubbing, or edema Skin:  . No rashes, lesions, ulcers . palpation of skin: no induration or nodules Neurologic:  . CN 2-12  intact . Sensation all 4 extremities intact Psychiatric:  . Mental status o Mood, affect appropriate o Orientation to person, place, time  . judgment and insight appear intact   I have personally reviewed the following:   Today's Data  . Vitals, CMP, CBC  Imaging  . CXR  Scheduled Meds: . bictegravir-emtricitabine-tenofovir AF  1 tablet Oral Daily  . budesonide (PULMICORT) nebulizer solution  0.25 mg Nebulization BID  . enalapril  10 mg Oral Daily   And  . hydrochlorothiazide  25 mg Oral Daily  . enoxaparin (LOVENOX) injection  40 mg Subcutaneous Daily  . levalbuterol  1.25 mg Nebulization Q6H  . methylPREDNISolone (SOLU-MEDROL) injection  60 mg Intravenous Q12H  . metoprolol tartrate  100 mg Oral Daily  . pravastatin  20 mg Oral Daily   Continuous Infusions:  Principal Problem:   Acute exacerbation of asthma with allergic rhinitis Active Problems:   Essential hypertension   Human immunodeficiency virus (HIV) disease ([facility])   Asthma exacerbation   LOS: 1 day   A & P  Acute respiratory failure with hypoxia secondary to severe asthma exacerbation: The patient is receiving IV Solu-Medrol 60 mg IV every 12 and Xopenex since patient. Xopenex was required due to severe and persistent tachycardia and headache due to albuterol. She will also receive pulmocort. The patient continues to be short of breath and rest and very dyspneic with 2-3 word conversation. Continue to monitor carefully.  Hypertension uncontrolled  presently on enalapril and hydrochlorothiazide.  HIV last CD4 count done in April 2021 was more than 400.  Continue antiretroviral. No signs of any pneumonia.  I have seen and examined this patient myself. I have spent 35 minutes in her evaluation and care.  DVT prophylaxis: Lovenox. Code Status: Full code. Family Communication: Discussed with patient. Disposition Plan:  The patient is from home. I anticipate discharge to home. Barriers to discharge include the  patient's continue hypoxia and increased work of breathing. Dymir Neeson, DO [facility] Direct contact: see [email or web address]  7PM-7AM contact night coverage as above 02/03/2020, 3:01 PM  LOS: 1 day

## 2020-02-03 NOTE — ED Notes (Signed)
Patient placed on 2L [employer] post  Treatment. Patient oxygen saturations 92-94%. Patient tolerating well.

## 2020-02-03 NOTE — ED Notes (Signed)
Report attempted x 1

## 2020-02-03 NOTE — H&P (Signed)
History and Physical    Maria Adkins [id number] DOB: [birth year] DOA: 02/02/2020  PCP: Lauree Chandler, NP  Patient coming from: Home.  Chief Complaint: Shortness of breath.  HPI: Maria Adkins is a 69 y.o. female with history of asthma HIV hypertension has been experiencing increasing wheezing and shortness of breath over the last 1 week.  Denies any chest pain productive cough fever or chills.  Had gone to her PCP last week and was prescribed prednisone taper.despite taking which patient was still wheezing and short of breath.  Patient did not notice any swelling of the legs or any exertional symptoms.  ED Course: In the ER chest x-ray was unremarkable patient was not febrile and was able to complete sentences.  Covid test was negative.  Despite multiple doses of nebulizer patient was still short of breath and admitted for further management.  Labs were unremarkable.  On my exam patient is still appearing short of breath with decreased air exchange.  Review of Systems: As per HPI, rest all negative.   Past Medical History:  Diagnosis Date  . Asthma   . High blood pressure   . HIV (human immunodeficiency virus infection) ([place])   . Osteoarthritis     Past Surgical History:  Procedure Laterality Date  . Biopsy of Liver    . CATARACT EXTRACTION, BILATERAL    . CHOLECYSTECTOMY    . CYST REMOVAL HAND Left   . LAPAROSCOPIC SALPINGOOPHERECTOMY Right      reports that she quit smoking about 41 years ago. Her smoking use included cigarettes. She has a 40.00 pack-year smoking history. She has never used smokeless tobacco. She reports that she does not drink alcohol or use drugs.  No Known Allergies  Family History  Problem Relation Age of Onset  . Diabetes Mother        died at age 33  . Hypertension Mother   . Asthma Sister   . Diabetes Sister   . Hypertension Sister   . Arthritis Sister   . Diabetes Sister   . Hypertension Sister   . Asthma Sister   . Arthritis  Sister   . Asthma Son        controlled    Prior to Admission medications   Medication Sig Start Date End Date Taking? Authorizing Provider  albuterol (PROVENTIL) (2.5 MG/3ML) 0.083% nebulizer solution Take 3 mLs (2.5 mg total) by nebulization every 4 (four) hours as needed for wheezing or shortness of breath. 12/22/19   Lauree Chandler, NP  albuterol (VENTOLIN HFA) 108 (90 Base) MCG/ACT inhaler INHALE 2 PUFFS BY MOUTH EVERY 4 HOURS AS NEEDED 05/23/19   Lauree Chandler, NP  bictegravir-emtricitabine-tenofovir AF (BIKTARVY) 50-200-25 MG TABS tablet Take 1 tablet by mouth daily. 12/27/19   Kuppelweiser, Cassie L, RPH-CPP  Blood Pressure Monitoring (BLOOD PRESSURE CUFF) MISC 1 Device by Does not apply route daily. DX: I10 07/14/19   Lauree Chandler, NP  budesonide-formoterol (SYMBICORT) 160-4.5 MCG/ACT inhaler Inhale 2 puffs into the lungs in the morning and at bedtime. 01/12/20   Lauree Chandler, NP  Calcium Carbonate (CALCIUM 600 PO) Take by mouth daily.    [provider]  carbamide peroxide (DEBROX) 6.5 % OTIC solution Place 5 drops into both ears 2 (two) times daily for 4 days. 01/30/20 02/03/20  Ngetich, Dinah C, NP  enalapril-hydrochlorothiazide (VASERETIC) 10-25 MG tablet TAKE 1 TABLET BY MOUTH EVERY DAY 08/14/19   Lauree Chandler, NP  fluticasone (FLONASE) 50 MCG/ACT nasal spray  Place 2 sprays into both nostrils daily. 01/30/20   Ngetich, Dinah C, NP  metoprolol tartrate (LOPRESSOR) 100 MG tablet TAKE 1 TABLET BY MOUTH EVERY DAY 01/19/20   Lauree Chandler, NP  montelukast (SINGULAIR) 10 MG tablet TAKE 1 TABLET BY MOUTH EVERYDAY AT BEDTIME 06/30/19   Lauree Chandler, NP  Multiple Vitamins-Minerals (MULTIVITAMIN GUMMIES ADULT PO) Take 2 tablets by mouth daily. Gummy    [provider]  Omega-3 Fatty Acids (FISH OIL) 1000 MG CAPS Take by mouth.    [provider]  Polyethyl Glycol-Propyl Glycol (SYSTANE HYDRATION PF OP) Apply to eye.    [provider]  pravastatin (PRAVACHOL) 20 MG tablet TAKE 1 TABLET BY MOUTH EVERY DAY 08/29/19   Lauree Chandler, NP  predniSONE (DELTASONE) 10 MG tablet Take 40 mg ( 4 tablets) by mouth x 1 day then  Take 30 mg (3 tablets ) by mouth x 1 day then  Take 20 Mg ( 2 Tablets ) By mouth x 1 day then  Take 10 mg ( 1 Tablet )  by mouth and stop. 01/30/20   Ngetich, Dinah C, NP  Saccharomyces boulardii (PROBIOTIC) 250 MG CAPS By mouth daily 01/12/20   Lauree Chandler, NP  VITAMIN D PO Take 1 capsule by mouth daily.    [provider]  Wheat Dextrin (BENEFIBER DRINK MIX) PACK Daily for constipation 01/12/20   Lauree Chandler, NP    Physical Exam: Constitutional: Moderately built and nourished. Vitals:   02/03/20 0130 02/03/20 0200 02/03/20 0309 02/03/20 0346  BP: (!) 154/88 136/78 (!) 159/94   Pulse: (!) 121 (!) 111 (!) 118   Resp: (!) 28 (!) 22 20 18   Temp:  98.4 F (36.9 C) 98.4 F (36.9 C)   TempSrc:  Oral Oral   SpO2: 92% 94% 99% 98%  Weight:   77.9 kg   Height:   5\' 8"  (1.727 m)    Eyes: Anicteric no pallor. ENMT: No discharge from the ears eyes nose or mouth. Neck: No mass felt.  No neck rigidity. Respiratory: Bilateral tight air entry.  No crepitations. Cardiovascular: S1-S2 heard. Abdomen: Soft nontender bowel sounds present. Musculoskeletal: No edema. Skin: No rash. Neurologic: Alert awake oriented to time place and person.  Moves all extremities. Psychiatric: Appears normal.   Labs on Admission: I have personally reviewed following labs and imaging studies  CBC: Recent Labs  Lab 02/02/20 2204  WBC 8.8  HGB 14.8  HCT 43.2  MCV 92.1  PLT 123456   Basic Metabolic Panel: Recent Labs  Lab 02/02/20 2204  NA 140  K 3.7  CL 101  CO2 29  GLUCOSE 105*  BUN 19  CREATININE 0.99  CALCIUM 9.5   GFR: Estimated Creatinine Clearance: 58 mL/min (by C-G formula based on SCr of 0.99 mg/dL). Liver Function Tests: No results for input(s): AST, ALT, ALKPHOS,  BILITOT, PROT, ALBUMIN in the last 168 hours. No results for input(s): LIPASE, AMYLASE in the last 168 hours. No results for input(s): AMMONIA in the last 168 hours. Coagulation Profile: No results for input(s): INR, PROTIME in the last 168 hours. Cardiac Enzymes: No results for input(s): CKTOTAL, CKMB, CKMBINDEX, TROPONINI in the last 168 hours. BNP (last 3 results) No results for input(s): PROBNP in the last 8760 hours. HbA1C: No results for input(s): HGBA1C in the last 72 hours. CBG: No results for input(s): GLUCAP in the last 168 hours. Lipid Profile: No results for input(s): CHOL, HDL, LDLCALC, TRIG, CHOLHDL,  LDLDIRECT in the last 72 hours. Thyroid Function Tests: No results for input(s): TSH, T4TOTAL, FREET4, T3FREE, THYROIDAB in the last 72 hours. Anemia Panel: No results for input(s): VITAMINB12, FOLATE, FERRITIN, TIBC, IRON, RETICCTPCT in the last 72 hours. Urine analysis:    Component Value Date/Time   COLORURINE YELLOW 09/12/2018 2233   APPEARANCEUR CLEAR 09/12/2018 2233   LABSPEC 1.025 09/12/2018 2233   PHURINE 5.5 09/12/2018 2233   GLUCOSEU NEGATIVE 09/12/2018 2233   HGBUR MODERATE (A) 09/12/2018 2233   BILIRUBINUR SMALL (A) 09/12/2018 2233   BILIRUBINUR neg 08/22/2018 [place] 09/12/2018 2233   PROTEINUR NEGATIVE 09/12/2018 2233   UROBILINOGEN negative (A) 08/22/2018 0907   NITRITE NEGATIVE 09/12/2018 2233   LEUKOCYTESUR NEGATIVE 09/12/2018 2233   Sepsis Labs: @LABRCNTIP (procalcitonin:4,lacticidven:4) ) Recent Results (from the past 240 hour(s))  SARS Coronavirus 2 by RT PCR (hospital order, performed in [facility] hospital lab) Nasopharyngeal Nasopharyngeal Swab     Status: None   Collection Time: 02/02/20 11:23 PM   Specimen: Nasopharyngeal Swab  Result Value Ref Range Status   SARS Coronavirus 2 NEGATIVE NEGATIVE Final    Comment: (NOTE) SARS-CoV-2 target nucleic acids are NOT DETECTED. The SARS-CoV-2 RNA is generally detectable in  upper and lower respiratory specimens during the acute phase of infection. The lowest concentration of SARS-CoV-2 viral copies this assay can detect is 250 copies / mL. A negative result does not preclude SARS-CoV-2 infection and should not be used as the sole basis for treatment or other patient management decisions.  A negative result may occur with improper specimen collection / handling, submission of specimen other than nasopharyngeal swab, presence of viral mutation(s) within the areas targeted by this assay, and inadequate number of viral copies (<250 copies / mL). A negative result must be combined with clinical observations, patient history, and epidemiological information. Fact Sheet for Patients:   StrictlyIdeas.no Fact Sheet for Healthcare Providers: BankingDealers.co.za This test is not yet approved or cleared  by the [place] FDA and has been authorized for detection and/or diagnosis of SARS-CoV-2 by FDA under an Emergency Use Authorization (EUA).  This EUA will remain in effect (meaning this test can be used) for the duration of the COVID-19 declaration under Section 564(b)(1) of the Act, 21 U.S.C. section 360bbb-3(b)(1), unless the authorization is terminated or revoked sooner. Performed at [facility], [address] [place], [place] [address]      Radiological Exams on Admission: DG Chest Portable 1 View  Result Date: 02/02/2020 CLINICAL DATA:  Short of breath for 1 week, asthma EXAM: PORTABLE CHEST 1 VIEW COMPARISON:  12/27/2019 FINDINGS: Two frontal views of the chest demonstrate a stable cardiac silhouette. Minimal atherosclerosis of the aortic arch unchanged. No airspace disease, effusion, or pneumothorax. No acute bony abnormalities. IMPRESSION: 1. Stable exam, no acute process. Electronically Signed   By: Randa Ngo M.D.   On: 02/02/2020 22:35      Assessment/Plan Principal Problem:    Acute exacerbation of asthma with allergic rhinitis Active Problems:   Essential hypertension   Human immunodeficiency virus (HIV) disease (HCC)   Asthma exacerbation    1. Acute respiratory failure with hypoxia secondary to severe asthma exacerbation for which I have placed patient on Solu-Medrol 60 mg IV every 12 and placed on Xopenex since patient was getting very tachycardic and headache on albuterol.  Pulmicort has been ordered.  Closely monitor. 2. Hypertension uncontrolled presently on enalapril and hydrochlorothiazide. 3. HIV last CD4 count done in  April 2021 was more than 400.  Continue antiretroviral.  No signs of any pneumonia.  Given that patient has persistent shortness of breath despite multiple doses of nebulizer and steroids patient is needing close monitoring and inpatient status.   DVT prophylaxis: Lovenox. Code Status: Full code. Family Communication: Discussed with patient. Disposition Plan: Home. Consults called: None. Admission status: Inpatient.   Rise Patience MD [facility] Pager [phone].  If 7PM-7AM, please contact night-coverage [email or web address] Password [facility]  02/03/2020, 4:56 AM

## 2020-02-03 NOTE — Progress Notes (Signed)
Results for Maria Adkins, Maria Adkins (MRN [id number]) as of 02/03/2020 09:24  Ref. Range 02/02/2020 22:04 02/03/2020 05:11  Potassium Latest Ref Range: 3.5 - 5.1 mmol/L 3.7 2.9 (L)    A. Swayze DO paged above results

## 2020-02-04 LAB — MAGNESIUM: Magnesium: 2.4 mg/dL (ref 1.7–2.4)

## 2020-02-04 LAB — BASIC METABOLIC PANEL
Anion gap: 7 (ref 5–15)
BUN: 15 mg/dL (ref 8–23)
CO2: 29 mmol/L (ref 22–32)
Calcium: 9.5 mg/dL (ref 8.9–10.3)
Chloride: 102 mmol/L (ref 98–111)
Creatinine, Ser: 0.91 mg/dL (ref 0.44–1.00)
GFR calc Af Amer: >60 mL/min (ref >60)
GFR calc non Af Amer: >60 mL/min (ref >60)
Glucose, Bld: 148 mg/dL — ABNORMAL HIGH (ref 70–99)
Potassium: 4.6 mmol/L (ref 3.5–5.1)
Sodium: 138 mmol/L (ref 135–145)

## 2020-02-04 MED ORDER — PREDNISONE 10 MG PO TABS
ORAL_TABLET | ORAL | 0 refills | Status: DC
Start: 2020-02-04 — End: 2020-02-04

## 2020-02-04 MED ORDER — PREDNISONE 10 MG PO TABS: ORAL_TABLET | ORAL | 0 refills | Status: AC

## 2020-02-04 NOTE — Progress Notes (Signed)
SATURATION QUALIFICATIONS: (This note is used to comply with regulatory documentation for home oxygen)  Patient Saturations on Room Air at Rest = 95%  Patient Saturations on Room Air while Ambulating = 95%   

## 2020-02-04 NOTE — Discharge Summary (Addendum)
Physician Discharge Summary  Maria Adkins [id number] DOB: 07-Sep-1951 DOA: 02/02/2020  PCP: Lauree Chandler, NP  Admit date: 02/02/2020 Discharge date: 02/04/2020  Recommendations for Outpatient Follow-up:  Discharge to home. Avoid respiratory irritantants. Follow up with PCP in 7-10 days.   Discharge Diagnoses: Principal diagnosis is #1 Acute bronchitis COPD exacerbation Severe asthma exacerbation Acute hypoxic respiratory failure Hypertension HIV  Discharge Condition: Fair  Disposition: Home  Diet recommendation: Heart healthy  Filed Weights   02/03/20 0309  Weight: 77.9 kg   History of present illness:  Maria Adkins is a 69 y.o. female with history of asthma HIV hypertension has been experiencing increasing wheezing and shortness of breath over the last 1 week.  Denies any chest pain productive cough fever or chills.  Had gone to her PCP last week and was prescribed prednisone taper.despite taking which patient was still wheezing and short of breath.  Patient did not notice any swelling of the legs or any exertional symptoms.   ED Course: In the ER chest x-ray was unremarkable patient was not febrile and was able to complete sentences.  Covid test was negative.  Despite multiple doses of nebulizer patient was still short of breath and admitted for further management.  Labs were unremarkable.  On my exam patient is still appearing short of breath with decreased air exchange.  Hospital Course:  The patient was admitted to a telemetry bed. She was given nebulizer treatments and IV steroids as well as supplementary O2. Her respiratory status has returned to baseline. She was able to maintain saturations in the 90's with ambulation on room air.  Today's assessment: S: The patient is sitting up in bed. No new complaints. She states that she is ready to go home. O: Vitals:  Vitals:   02/04/20 0724 02/04/20 0751  BP: 134/79   Pulse: 82   Resp: 17   Temp: 97.9 F (36.6  C)   SpO2: 96% 95%   Exam:  Constitutional:  The patient is awake, alert, and oriented x 3. No acute distress. Respiratory:  No increased work of breathing. No wheezes, rales, or rhonchi No tactile fremitus Cardiovascular:  Regular rate and rhythm No murmurs, ectopy, or gallups. No lateral PMI. No thrills. Abdomen:  Abdomen is soft, non-tender, non-distended No hernias, masses, or organomegaly Normoactive bowel sounds.  Musculoskeletal:  No cyanosis, clubbing, or edema Skin:  No rashes, lesions, ulcers palpation of skin: no induration or nodules Neurologic:  CN 2-12 intact Sensation all 4 extremities intact Psychiatric:  Mental status Mood, affect appropriate Orientation to person, place, time  judgment and insight appear intact  Discharge Instructions  Discharge Instructions     Activity as tolerated - No restrictions   Complete by: As directed    Call MD for:  persistant nausea and vomiting   Complete by: As directed    Call MD for:  severe uncontrolled pain   Complete by: As directed    Diet - low sodium heart healthy   Complete by: As directed    Discharge instructions   Complete by: As directed    Discharge to home. Avoid respiratory irritantants. Follow up with PCP in 7-10 days.   Increase activity slowly   Complete by: As directed       Allergies as of 02/04/2020   No Known Allergies      Medication List     STOP taking these medications    Benefiber Drink Mix Pack   Debrox 6.5 % OTIC solution Generic drug:  carbamide peroxide       TAKE these medications    albuterol 108 (90 Base) MCG/ACT inhaler Commonly known as: VENTOLIN HFA INHALE 2 PUFFS BY MOUTH EVERY 4 HOURS AS NEEDED What changed:  when to take this reasons to take this   albuterol (2.5 MG/3ML) 0.083% nebulizer solution Commonly known as: PROVENTIL Take 3 mLs (2.5 mg total) by nebulization every 4 (four) hours as needed for wheezing or shortness of breath. What  changed: Another medication with the same name was changed. Make sure you understand how and when to take each.   Biktarvy 50-200-25 MG Tabs tablet Generic drug: bictegravir-emtricitabine-tenofovir AF Take 1 tablet by mouth daily.   Blood Pressure Cuff Misc 1 Device by Does not apply route daily. DX: I10   budesonide-formoterol 160-4.5 MCG/ACT inhaler Commonly known as: Symbicort Inhale 2 puffs into the lungs in the morning and at bedtime.   CALCIUM 600 PO Take 1 tablet by mouth daily.   enalapril-hydrochlorothiazide 10-25 MG tablet Commonly known as: VASERETIC TAKE 1 TABLET BY MOUTH EVERY DAY   Fish Oil 1000 MG Caps Take by mouth.   fluticasone 50 MCG/ACT nasal spray Commonly known as: FLONASE Place 2 sprays into both nostrils daily.   metoprolol tartrate 100 MG tablet Commonly known as: LOPRESSOR TAKE 1 TABLET BY MOUTH EVERY DAY   montelukast 10 MG tablet Commonly known as: SINGULAIR TAKE 1 TABLET BY MOUTH EVERYDAY AT BEDTIME What changed: See the new instructions.   MULTIVITAMIN GUMMIES ADULT PO Take 2 tablets by mouth daily. Gummy   pravastatin 20 MG tablet Commonly known as: PRAVACHOL TAKE 1 TABLET BY MOUTH EVERY DAY   predniSONE 10 MG tablet Commonly known as: DELTASONE Take 6 tablets (60 mg total) by mouth daily for 3 days, THEN 5 tablets (50 mg total) daily for 3 days, THEN 4 tablets (40 mg total) daily for 3 days, THEN 3 tablets (30 mg total) daily for 3 days, THEN 2 tablets (20 mg total) daily for 3 days, THEN 1 tablet (10 mg total) daily for 3 days. Start taking on: Feb 04, 2020 What changed: See the new instructions.   Probiotic 250 MG Caps By mouth daily What changed:  how much to take how to take this when to take this additional instructions   SYSTANE HYDRATION PF OP Place 1 drop into both eyes daily as needed (For dry eyes).   VITAMIN D PO Take 1 capsule by mouth daily.       No Known Allergies  The results of significant diagnostics  from this hospitalization (including imaging, microbiology, ancillary and laboratory) are listed below for reference.    Significant Diagnostic Studies: DG Chest Portable 1 View  Result Date: 02/02/2020 CLINICAL DATA:  Short of breath for 1 week, asthma EXAM: PORTABLE CHEST 1 VIEW COMPARISON:  12/27/2019 FINDINGS: Two frontal views of the chest demonstrate a stable cardiac silhouette. Minimal atherosclerosis of the aortic arch unchanged. No airspace disease, effusion, or pneumothorax. No acute bony abnormalities. IMPRESSION: 1. Stable exam, no acute process. Electronically Signed   By: Randa Ngo M.D.   On: 02/02/2020 22:35    Microbiology: Recent Results (from the past 240 hour(s))  SARS Coronavirus 2 by RT PCR (hospital order, performed in [facility] hospital lab) Nasopharyngeal Nasopharyngeal Swab     Status: None   Collection Time: 02/02/20 11:23 PM   Specimen: Nasopharyngeal Swab  Result Value Ref Range Status   SARS Coronavirus 2 NEGATIVE NEGATIVE Final    Comment: (NOTE)  SARS-CoV-2 target nucleic acids are NOT DETECTED. The SARS-CoV-2 RNA is generally detectable in upper and lower respiratory specimens during the acute phase of infection. The lowest concentration of SARS-CoV-2 viral copies this assay can detect is 250 copies / mL. A negative result does not preclude SARS-CoV-2 infection and should not be used as the sole basis for treatment or other patient management decisions.  A negative result may occur with improper specimen collection / handling, submission of specimen other than nasopharyngeal swab, presence of viral mutation(s) within the areas targeted by this assay, and inadequate number of viral copies (<250 copies / mL). A negative result must be combined with clinical observations, patient history, and epidemiological information. Fact Sheet for Patients:   StrictlyIdeas.no Fact Sheet for Healthcare  Providers: BankingDealers.co.za This test is not yet approved or cleared  by the [place] FDA and has been authorized for detection and/or diagnosis of SARS-CoV-2 by FDA under an Emergency Use Authorization (EUA).  This EUA will remain in effect (meaning this test can be used) for the duration of the COVID-19 declaration under Section 564(b)(1) of the Act, 21 U.S.C. section 360bbb-3(b)(1), unless the authorization is terminated or revoked sooner. Performed at [facility], [address] [place], [place] [address]      Labs: Basic Metabolic Panel: Recent Labs  Lab 02/02/20 2204 02/03/20 0511 02/04/20 0720  NA 140 141 138  K 3.7 2.9* 4.6  CL 101 101 102  CO2 29 24 29   GLUCOSE 105* 195* 148*  BUN 19 17 15   CREATININE 0.99 1.21* 0.91  CALCIUM 9.5 9.4 9.5  MG  --  2.3 2.4   Liver Function Tests: Recent Labs  Lab 02/03/20 0511  AST 31  ALT 27  ALKPHOS 46  BILITOT 0.6  PROT 7.5  ALBUMIN 4.2   No results for input(s): LIPASE, AMYLASE in the last 168 hours. No results for input(s): AMMONIA in the last 168 hours. CBC: Recent Labs  Lab 02/02/20 2204 02/03/20 0511  WBC 8.8 10.1  NEUTROABS  --  8.0*  HGB 14.8 15.2*  HCT 43.2 46.0  MCV 92.1 95.6  PLT 274 295   Cardiac Enzymes: No results for input(s): CKTOTAL, CKMB, CKMBINDEX, TROPONINI in the last 168 hours. BNP: BNP (last 3 results) No results for input(s): BNP in the last 8760 hours.  ProBNP (last 3 results) No results for input(s): PROBNP in the last 8760 hours.  CBG: No results for input(s): GLUCAP in the last 168 hours.  Principal Problem:   Acute exacerbation of asthma with allergic rhinitis Active Problems:   Essential hypertension   Human immunodeficiency virus (HIV) disease ([place])   Asthma exacerbation   Time coordinating discharge: 38 minutes  Signed:        Styles Fambro, DO [facility]  02/04/2020, 4:39 PM

## 2020-02-05 ENCOUNTER — Telehealth: Payer: Self-pay | Admitting: *Deleted

## 2020-02-05 ENCOUNTER — Encounter: Payer: Self-pay | Admitting: Nurse Practitioner

## 2020-02-05 ENCOUNTER — Ambulatory Visit: Payer: 59 | Admitting: Family

## 2020-02-05 ENCOUNTER — Other Ambulatory Visit: Payer: Self-pay

## 2020-02-05 ENCOUNTER — Telehealth: Payer: Self-pay

## 2020-02-05 ENCOUNTER — Ambulatory Visit (INDEPENDENT_AMBULATORY_CARE_PROVIDER_SITE_OTHER): Payer: 59 | Admitting: Nurse Practitioner

## 2020-02-05 VITALS — BP 142/90 | HR 78 | Temp 97.7°F | Ht 68.0 in | Wt 172.0 lb

## 2020-02-05 DIAGNOSIS — H6123 Impacted cerumen, bilateral: Secondary | ICD-10-CM | POA: Diagnosis not present

## 2020-02-05 DIAGNOSIS — J4531 Mild persistent asthma with (acute) exacerbation: Secondary | ICD-10-CM

## 2020-02-05 MED ORDER — LEVALBUTEROL HCL 0.63 MG/3ML IN NEBU: 0.6300 mg | INHALATION_SOLUTION | Freq: Four times a day (QID) | RESPIRATORY_TRACT | 12 refills | Status: DC | PRN

## 2020-02-05 MED FILL — predniSONE 10 MG TABS: 10 | 18 days supply | Qty: 63 | Fill #0

## 2020-02-05 NOTE — Patient Instructions (Signed)
To use nebulizer routinely every 6 hours (while awake) for the next 5 days   mucinex DM by mouth twice daily with full glass of water for 1 week.

## 2020-02-05 NOTE — Telephone Encounter (Signed)
Refill request from pharmacy for 90 day supply for Symbicort. Spoke with patient and she does not want a 90 day supply of medication. She stated that she can't afford it.

## 2020-02-05 NOTE — Telephone Encounter (Signed)
Transition Care Management Follow-up Telephone Call  Date of discharge and from where: 02/04/2020 [place]  How have you been since you were released from the hospital? Still have cough  Any questions or concerns? No  Did have Covid testing done in the Hospital and it came back Negative.   Items Reviewed:  Did the pt receive and understand the discharge instructions provided? Yes   Medications obtained and verified? Yes  wants to discuss Neb Medication with Janett Billow today at appointment.   Any new allergies since your discharge? No   Dietary orders reviewed? Yes  Do you have support at home? Yes  Son  Other (ie: DME, Home Health, etc) No Home Health Ordered.   Functional Questionnaire: (I = Independent and D = Dependent) ADL's: I  Bathing/Dressing- I   Meal Prep- I  Eating- I  Maintaining continence- I  Transferring/Ambulation- I  Managing Meds- I   Follow up appointments reviewed:    PCP Hospital f/u appt confirmed? Yes  Scheduled to see Janett Billow on 02/05/2020 @ 1:30.  [facility] f/u appt confirmed? No    Are transportation arrangements needed? No  Son is bringing her to appointment.   If their condition worsens, is the pt aware to call  their PCP or go to the ED? Yes  Was the patient provided with contact information for the PCP's office or ED? Yes  Was the pt encouraged to call back with questions or concerns? Yes

## 2020-02-05 NOTE — Progress Notes (Signed)
Careteam: Patient Care Team: Lauree Chandler, NP as PCP - General (Geriatric Medicine)  PLACE OF SERVICE:  [place] Directive information    No Known Allergies  Chief Complaint  Patient presents with  . [facility] follow-up from 02/02/2020-02/04/2020 for asthma. Patient would like to discuss asthma regimen. Here with son Rushdan.   . Ear Problem    Ear fullness in right ear.   . Medication Management    Discuss changing nebulizer medication to levabuterol vs albuterol      HPI: Patient is a 69 y.o. female for hospital follow up. She went to the hospital on 02/02/20 due to increase shortness of breath and wheezing. She was admited due to decrease air movement on exam with increase work of breathing. Treated with IV steroids and supplemental o2 with improvement. Here today for follow up. She continues on prednisone taper. Now with ongoing wheezing and cough.   Asthma-using Symbicort twice daily and Singulair daily. Will use albuterol PRN but feels like her chest gets tighter after she takes it and does not like that sensation.  In the past spring has been bad for her allergies and asthma. Worse this year.  Keeping filters changed in home, construction being done downstairs.  High pollen outside effecting her.    Review of Systems:  Review of Systems  Constitutional: Negative for chills, fever and weight loss.  HENT: Negative for tinnitus.   Respiratory: Positive for cough and shortness of breath. Negative for sputum production.   Cardiovascular: Negative for chest pain, palpitations and leg swelling.  Gastrointestinal: Negative for abdominal pain, constipation, diarrhea and heartburn.  Genitourinary: Negative for dysuria, frequency and urgency.  Musculoskeletal: Negative for back pain, falls, joint pain and myalgias.  Skin: Negative.   Neurological: Negative for dizziness and headaches.  Psychiatric/Behavioral: Negative for depression and  memory loss. The patient does not have insomnia.     Past Medical History:  Diagnosis Date  . Asthma   . High blood pressure   . HIV (human immunodeficiency virus infection) ([place])   . Osteoarthritis    Past Surgical History:  Procedure Laterality Date  . Biopsy of Liver    . CATARACT EXTRACTION, BILATERAL    . CHOLECYSTECTOMY    . CYST REMOVAL HAND Left   . LAPAROSCOPIC SALPINGOOPHERECTOMY Right    Social History:   reports that she quit smoking about 41 years ago. Her smoking use included cigarettes. She has a 40.00 pack-year smoking history. She has never used smokeless tobacco. She reports that she does not drink alcohol or use drugs.  Family History  Problem Relation Age of Onset  . Diabetes Mother        died at age 75  . Hypertension Mother   . Asthma Sister   . Diabetes Sister   . Hypertension Sister   . Arthritis Sister   . Diabetes Sister   . Hypertension Sister   . Asthma Sister   . Arthritis Sister   . Asthma Son        controlled    Medications: Patient's Medications  New Prescriptions   No medications on file  Previous Medications   ALBUTEROL (PROVENTIL) (2.5 MG/3ML) 0.083% NEBULIZER SOLUTION    Take 3 mLs (2.5 mg total) by nebulization every 4 (four) hours as needed for wheezing or shortness of breath.   ALBUTEROL (VENTOLIN HFA) 108 (90 BASE) MCG/ACT INHALER    INHALE 2 PUFFS BY MOUTH EVERY 4  HOURS AS NEEDED   BICTEGRAVIR-EMTRICITABINE-TENOFOVIR AF (BIKTARVY) 50-200-25 MG TABS TABLET    Take 1 tablet by mouth daily.   BLOOD PRESSURE MONITORING (BLOOD PRESSURE CUFF) MISC    1 Device by Does not apply route daily. DX: I10   BUDESONIDE-FORMOTEROL (SYMBICORT) 160-4.5 MCG/ACT INHALER    Inhale 2 puffs into the lungs in the morning and at bedtime.   CALCIUM CARBONATE (CALCIUM 600 PO)    Take 1 tablet by mouth daily.    ENALAPRIL-HYDROCHLOROTHIAZIDE (VASERETIC) 10-25 MG TABLET    TAKE 1 TABLET BY MOUTH EVERY DAY   FLUTICASONE (FLONASE) 50 MCG/ACT NASAL SPRAY     Place 2 sprays into both nostrils daily.   METOPROLOL TARTRATE (LOPRESSOR) 100 MG TABLET    TAKE 1 TABLET BY MOUTH EVERY DAY   MONTELUKAST (SINGULAIR) 10 MG TABLET    TAKE 1 TABLET BY MOUTH EVERYDAY AT BEDTIME   MULTIPLE VITAMINS-MINERALS (MULTIVITAMIN GUMMIES ADULT PO)    Take 2 tablets by mouth daily. Gummy   OMEGA-3 FATTY ACIDS (FISH OIL) 1000 MG CAPS    Take by mouth.   POLYETHYL GLYCOL-PROPYL GLYCOL (SYSTANE HYDRATION PF OP)    Place 1 drop into both eyes daily as needed (For dry eyes).    PRAVASTATIN (PRAVACHOL) 20 MG TABLET    TAKE 1 TABLET BY MOUTH EVERY DAY   PREDNISONE (DELTASONE) 10 MG TABLET    Take 6 tablets (60 mg total) by mouth daily for 3 days, THEN 5 tablets (50 mg total) daily for 3 days, THEN 4 tablets (40 mg total) daily for 3 days, THEN 3 tablets (30 mg total) daily for 3 days, THEN 2 tablets (20 mg total) daily for 3 days, THEN 1 tablet (10 mg total) daily for 3 days.   SACCHAROMYCES BOULARDII (PROBIOTIC) 250 MG CAPS    By mouth daily   VITAMIN D PO    Take 1 capsule by mouth daily.  Modified Medications   No medications on file  Discontinued Medications   No medications on file    Physical Exam:  Vitals:   02/05/20 1339  BP: (!) 142/90  Pulse: 78  Temp: 97.7 F (36.5 C)  TempSrc: Temporal  SpO2: 96%  Weight: 172 lb (78 kg)  Height: 5\' 8"  (1.727 m)   Body mass index is 26.15 kg/m. Wt Readings from Last 3 Encounters:  02/05/20 172 lb (78 kg)  02/03/20 171 lb 11.2 oz (77.9 kg)  01/30/20 176 lb (79.8 kg)    Physical Exam Constitutional:      Appearance: Normal appearance.  HENT:     Head: Normocephalic and atraumatic.     Right Ear: External ear normal. There is impacted cerumen.     Left Ear: External ear normal. There is impacted cerumen.  Cardiovascular:     Rate and Rhythm: Normal rate.  Pulmonary:     Effort: Pulmonary effort is normal.     Breath sounds: Wheezing present.  Abdominal:     General: Abdomen is flat.     Palpations: Abdomen  is soft.  Musculoskeletal:     Right lower leg: No edema.     Left lower leg: No edema.  Skin:    General: Skin is warm and dry.  Neurological:     Mental Status: She is alert and oriented to person, place, and time.  Psychiatric:        Mood and Affect: Mood normal.        Behavior: Behavior normal.  Labs reviewed: Basic Metabolic Panel: Recent Labs    02/02/20 2204 02/03/20 0511 02/04/20 0720  NA 140 141 138  K 3.7 2.9* 4.6  CL 101 101 102  CO2 29 24 29   GLUCOSE 105* 195* 148*  BUN 19 17 15   CREATININE 0.99 1.21* 0.91  CALCIUM 9.5 9.4 9.5  MG  --  2.3 2.4   Liver Function Tests: Recent Labs    01/02/20 1049 01/08/20 0842 02/03/20 0511  AST 20 18 31   ALT 28 25 27   ALKPHOS  --   --  46  BILITOT 0.8 1.0 0.6  PROT 6.4 6.2 7.5  ALBUMIN  --   --  4.2   No results for input(s): LIPASE, AMYLASE in the last 8760 hours. No results for input(s): AMMONIA in the last 8760 hours. CBC: Recent Labs    01/02/20 1049 01/02/20 1049 01/08/20 0842 02/02/20 2204 02/03/20 0511  WBC 13.2*   < > 6.4 8.8 10.1  NEUTROABS 8,884*  --  2,317  --  8.0*  HGB 13.9   < > 13.5 14.8 15.2*  HCT 41.7   < > 41.1 43.2 46.0  MCV 91.9   < > 92.2 92.1 95.6  PLT 301   < > 251 274 295   < > = values in this interval not displayed.   Lipid Panel: Recent Labs    01/02/20 1049 01/08/20 0842  CHOL 155 141  HDL 44* 44*  LDLCALC 83 78  TRIG 186* 102  CHOLHDL 3.5 3.2   TSH: No results for input(s): TSH in the last 8760 hours. A1C: No results found for: HGBA1C   Assessment/Plan 1. Mild persistent asthma with acute exacerbation -continues to have wheezing, but shortness of breath and wheezing have improved overall. She continues on prednisone taper. Encouraged to use albuterol nebulizer scheduled TID while awake. She request xopenex due to palpitations associated with albuterol. However unsure if she will be able to get due to cost. If she is able to get xopenex to use this instead of  albuterol.  - levalbuterol (XOPENEX) 0.63 MG/3ML nebulizer solution; Take 3 mLs (0.63 mg total) by nebulization every 6 (six) hours as needed for wheezing or shortness of breath.  Dispense: 3 mL; Refill: 12 - Ambulatory referral to Pulmonology due to recurrent exacerbations, may also benefit from seeing allergist as well.  -to start mucinex DM by mouth with full glass of water twice daily for 1 week.   2. Bilateral impacted cerumen Has used debox, removed bilateral impaction without difficulty with lavage.   Next appt: 05/27/20 as scheduled, sooner if needed  Janett Billow K. [place], [facility] [phone]

## 2020-02-06 ENCOUNTER — Other Ambulatory Visit: Payer: Self-pay | Admitting: *Deleted

## 2020-02-06 ENCOUNTER — Encounter: Payer: Self-pay | Admitting: *Deleted

## 2020-02-06 NOTE — Patient Outreach (Signed)
[facility]) Care Management  02/06/2020  Maria Adkins September 29, 1950 [id number]   Transition of care call/case closure   Referral received:02/05/20 Initial outreach:02/06/20 Insurance: [place] [employer]    Subjective: Initial successful telephone call to patient's preferred number in order to complete transition of care assessment; 2 HIPAA identifiers verified. Explained purpose of call and completed transition of care assessment.  Tashica states that she is feeling so much better. She reports her breathing is so much better , no coughing a much only a little wheeze at times. She discussed visit with PCP and new prescription of xopenex but , she was advised her it may not be covered by insurance, and that is what  she was told at  [place], she continues to use proventil nebulizer as instructed, but doesn't like the way she fills afterwards a little jittery. She reports waiting on call for referral appointment to pulmonary. She reports tolerating diet , denies bowel or bladder problems.  Spouse is  assisting with her  recovery.  She discussed long history of Asthma, since in her 20's,worsen symptoms in spring due to allergies, discussed  the [place] chronic disease management programs and she is in agreement .  She is unsure if her spouse has the hospital indemnity, provided contact number for Teena Dunk [phone] She  uses a [facility] outpatient pharmacy at [place] She denies educational needs related to staying safe during the COVID 19 pandemic, she continues to wear her mask and has completed Covid 19 vaccines. .    Objective:  Maria Adkins  was hospitalized at [facility] from 5/21-5/23/21 for Acute exacerbation of Asthma with allergic rhinitis  Comorbidities include: Asthma,season allergies, HIV She was discharged to home on 02/04/20 without the need for home health services or DME.   Assessment:  Patient voices good understanding of all  discharge instructions.  See transition of care flowsheet for assessment details.   Plan:  Reviewed hospital discharge diagnosis of Acute exacerbation of Asthma   and discharge treatment plan using hospital discharge instructions, assessing medication adherence, reviewing problems requiring provider notification, and discussing the importance of follow up withprimary care provider and/or specialists as directed.  Reviewed [place] healthy lifestyle program information to receive discounted premium for  2022   Step 1: Get  your annual physical  Step 2: Complete your health assessment  Step 3:Identify your current health status and complete the corresponding action step between January 1, and May 15, 2020.    Using [place] website, after patient in agreement , enrolled to  participate in [place]'s Active Health Management chronic disease management program.    No ongoing care management needs identified so will close case to [place] Management services and route successful outreach letter with [place] Management pamphlet and 24 Hour Nurse Line Magnet to [place] Management clinical pool to be mailed to patient's home address.    Joylene Draft, RN, BSN  [place] Management Coordinator  [phone]- Mobile [phone]- Toll Free Main Office

## 2020-02-08 ENCOUNTER — Ambulatory Visit
Admission: RE | Admit: 2020-02-08 | Discharge: 2020-02-08 | Disposition: A | Discharge status: Home / Self Care | Payer: 59 | Source: Ambulatory Visit | Attending: Internal Medicine | Admitting: Internal Medicine

## 2020-02-08 ENCOUNTER — Other Ambulatory Visit: Payer: Self-pay

## 2020-02-08 DIAGNOSIS — Z0000 Encounter for general adult medical examination without abnormal findings: Secondary | ICD-10-CM

## 2020-02-08 DIAGNOSIS — Z1231 Encounter for screening mammogram for malignant neoplasm of breast: Secondary | ICD-10-CM | POA: Diagnosis not present

## 2020-02-09 ENCOUNTER — Other Ambulatory Visit: Payer: Self-pay | Admitting: Internal Medicine

## 2020-02-09 ENCOUNTER — Other Ambulatory Visit: Payer: Self-pay | Admitting: Nurse Practitioner

## 2020-02-09 DIAGNOSIS — I10 Essential (primary) hypertension: Secondary | ICD-10-CM

## 2020-02-09 DIAGNOSIS — R928 Other abnormal and inconclusive findings on diagnostic imaging of breast: Secondary | ICD-10-CM

## 2020-02-21 ENCOUNTER — Other Ambulatory Visit: Payer: Self-pay

## 2020-02-21 ENCOUNTER — Ambulatory Visit: Admission: RE | Admit: 2020-02-21 | Payer: 59 | Source: Ambulatory Visit

## 2020-02-21 ENCOUNTER — Ambulatory Visit
Admission: RE | Admit: 2020-02-21 | Discharge: 2020-02-21 | Disposition: A | Discharge status: Home / Self Care | Payer: 59 | Source: Ambulatory Visit | Attending: Internal Medicine | Admitting: Internal Medicine

## 2020-02-21 DIAGNOSIS — R928 Other abnormal and inconclusive findings on diagnostic imaging of breast: Secondary | ICD-10-CM | POA: Diagnosis not present

## 2020-02-26 ENCOUNTER — Emergency Department (HOSPITAL_COMMUNITY)
Admission: EM | Admit: 2020-02-26 | Discharge: 2020-02-26 | Disposition: A | Discharge status: Home / Self Care | Payer: 59 | Attending: Emergency Medicine | Admitting: Emergency Medicine

## 2020-02-26 ENCOUNTER — Emergency Department (HOSPITAL_COMMUNITY): Payer: 59

## 2020-02-26 ENCOUNTER — Encounter (HOSPITAL_COMMUNITY): Payer: Self-pay | Admitting: Emergency Medicine

## 2020-02-26 ENCOUNTER — Encounter: Payer: Self-pay | Admitting: Family

## 2020-02-26 ENCOUNTER — Other Ambulatory Visit: Payer: Self-pay

## 2020-02-26 ENCOUNTER — Ambulatory Visit (INDEPENDENT_AMBULATORY_CARE_PROVIDER_SITE_OTHER): Payer: 59 | Admitting: Family

## 2020-02-26 VITALS — BP 150/98 | HR 72 | Temp 97.3°F | Resp 16 | Ht 68.0 in | Wt 173.6 lb

## 2020-02-26 DIAGNOSIS — Z79899 Other long term (current) drug therapy: Secondary | ICD-10-CM | POA: Diagnosis not present

## 2020-02-26 DIAGNOSIS — B20 Human immunodeficiency virus [HIV] disease: Secondary | ICD-10-CM | POA: Insufficient documentation

## 2020-02-26 DIAGNOSIS — J4531 Mild persistent asthma with (acute) exacerbation: Secondary | ICD-10-CM | POA: Diagnosis not present

## 2020-02-26 DIAGNOSIS — R0602 Shortness of breath: Secondary | ICD-10-CM | POA: Diagnosis not present

## 2020-02-26 DIAGNOSIS — Z87891 Personal history of nicotine dependence: Secondary | ICD-10-CM | POA: Diagnosis not present

## 2020-02-26 DIAGNOSIS — J4521 Mild intermittent asthma with (acute) exacerbation: Secondary | ICD-10-CM | POA: Insufficient documentation

## 2020-02-26 MED ORDER — IPRATROPIUM-ALBUTEROL 0.5-2.5 (3) MG/3ML IN SOLN
3.0000 mL | Freq: Once | RESPIRATORY_TRACT | Status: AC
  Administered 2020-02-26: 3 mL via RESPIRATORY_TRACT

## 2020-02-26 MED ORDER — METHYLPREDNISOLONE SODIUM SUCC 125 MG IJ SOLR
125.0000 mg | Freq: Once | INTRAMUSCULAR | Status: AC
  Administered 2020-02-26: 125 mg via INTRAVENOUS
  Filled 2020-02-26: qty 2

## 2020-02-26 MED ORDER — MAGNESIUM SULFATE 2 GM/50ML IV SOLN
2.0000 g | Freq: Once | INTRAVENOUS | Status: AC
  Administered 2020-02-26: 2 g via INTRAVENOUS
  Filled 2020-02-26: qty 50

## 2020-02-26 MED ORDER — IPRATROPIUM-ALBUTEROL 0.5-2.5 (3) MG/3ML IN SOLN
3.0000 mL | Freq: Once | RESPIRATORY_TRACT | Status: AC
  Administered 2020-02-26: 3 mL via RESPIRATORY_TRACT
  Filled 2020-02-26: qty 6

## 2020-02-26 MED ORDER — IPRATROPIUM-ALBUTEROL 0.5-2.5 (3) MG/3ML IN SOLN: 3.0000 mL | Freq: Once | RESPIRATORY_TRACT | Status: AC

## 2020-02-26 MED ORDER — IPRATROPIUM-ALBUTEROL 0.5-2.5 (3) MG/3ML IN SOLN
RESPIRATORY_TRACT | Status: AC
  Administered 2020-02-26: 3 mL via RESPIRATORY_TRACT
  Filled 2020-02-26: qty 3

## 2020-02-26 MED ORDER — PREDNISONE 10 MG PO TABS
20.0000 mg | ORAL_TABLET | Freq: Every day | ORAL | 0 refills | Status: DC
Start: 2020-02-26 — End: 2020-05-31

## 2020-02-26 NOTE — ED Triage Notes (Signed)
Pt c/o increasing shortness of breath. Pt recently hospitalized for asthma exacerbation, has been taking steroids. Home inhalers have not been helping.

## 2020-02-26 NOTE — Progress Notes (Addendum)
Provider: Elika Godar FNP-C  Lauree Chandler, NP  Patient Care Team: Lauree Chandler, NP as PCP - General (Geriatric Medicine)  Extended Emergency Contact Information Primary Emergency Contact: [facility] Address: [address]          [place], [place] [address] Johnnette Litter of [place] Phone: [phone] Relation: Spouse Secondary Emergency Contact: Ealy,Rushdan Address: [place], [place] [address] Johnnette Litter of [place] Phone: [phone] Mobile Phone: [phone] Relation: Son  Code Status: Full Code  Goals of care: Advanced Directive information Advanced Directives 02/26/2020  Does Patient Have a Medical Advance Directive? No  Type of Advance Directive -  Does patient want to make changes to medical advance directive? No - Patient declined  Copy of [place] in Chart? -  Would patient like information on creating a medical advance directive? -     Chief Complaint  Patient presents with  . Acute Visit    Follow up on asthma.    HPI:  Pt is a 69 y.o. female seen today for an acute visit for asthma.she is here with son.she states has had shortness of breath which is worst when she moves around for the past 3 days.shortness of breath interferes with her sleep.she has used her Albuterol inhaler,Albuterol Nebulizer,symbicort  and Singulair.son states patient's spirometer has declined over the past three days despite use of nebulizer from [phone] max to 260 then 220 today.she has required Nebulizer every 3-4 hrs but after about 10 minutes trouble breathing recurs.son states patient has been sitting outside without her mask thinks pollen might have worsen her symptoms.Her [place] child has also been sick with cold symptoms. She has use son's albuterol inhaler on her way to visit today but still short of breath. She denies any fever,chills,loss of taste or smell.Has completed her COVID-19 vaccine.  States was referred  to Pulmonary specialist but was told the earliest appointment was July 1st,2021. She states recently completed Tapered Prednisone 60 mg tablet on 02/23/2020.Son states as soon as she tapered her prednisone symptoms worsen.   Past Medical History:  Diagnosis Date  . Asthma   . High blood pressure   . HIV (human immunodeficiency virus infection) ([place])   . Osteoarthritis    Past Surgical History:  Procedure Laterality Date  . Biopsy of Liver    . CATARACT EXTRACTION, BILATERAL    . CHOLECYSTECTOMY    . CYST REMOVAL HAND Left   . LAPAROSCOPIC SALPINGOOPHERECTOMY Right     No Known Allergies  Outpatient Encounter Medications as of 02/26/2020  Medication Sig  . albuterol (PROVENTIL) (2.5 MG/3ML) 0.083% nebulizer solution Take 3 mLs (2.5 mg total) by nebulization every 4 (four) hours as needed for wheezing or shortness of breath.  [name] albuterol (VENTOLIN HFA) 108 (90 Base) MCG/ACT inhaler INHALE 2 PUFFS BY MOUTH EVERY 4 HOURS AS NEEDED  . bictegravir-emtricitabine-tenofovir AF (BIKTARVY) 50-200-25 MG TABS tablet Take 1 tablet by mouth daily.  . Blood Pressure Monitoring (BLOOD PRESSURE CUFF) MISC 1 Device by Does not apply route daily. DX: I10  . budesonide-formoterol (SYMBICORT) 160-4.5 MCG/ACT inhaler Inhale 2 puffs into the lungs in the morning and at bedtime.  . Calcium Carbonate (CALCIUM 600 PO) Take 1 tablet by mouth daily.   . enalapril-hydrochlorothiazide (VASERETIC) 10-25 MG tablet TAKE 1 TABLET BY MOUTH EVERY DAY  . fluticasone (FLONASE) 50 MCG/ACT nasal spray Place 2 sprays into both nostrils daily.  [name] levalbuterol (XOPENEX) 0.63 MG/3ML  nebulizer solution Take 3 mLs (0.63 mg total) by nebulization every 6 (six) hours as needed for wheezing or shortness of breath.  . metoprolol tartrate (LOPRESSOR) 100 MG tablet TAKE 1 TABLET BY MOUTH EVERY DAY  . montelukast (SINGULAIR) 10 MG tablet TAKE 1 TABLET BY MOUTH EVERYDAY AT BEDTIME  . Multiple Vitamins-Minerals (MULTIVITAMIN GUMMIES ADULT  PO) Take 2 tablets by mouth daily. Gummy  . Omega-3 Fatty Acids (FISH OIL) 1000 MG CAPS Take by mouth.  Vladimir Faster Glycol-Propyl Glycol (SYSTANE HYDRATION PF OP) Place 1 drop into both eyes daily as needed (For dry eyes).   . pravastatin (PRAVACHOL) 20 MG tablet TAKE 1 TABLET BY MOUTH EVERY DAY  . Saccharomyces boulardii (PROBIOTIC) 250 MG CAPS By mouth daily  . VITAMIN D PO Take 1 capsule by mouth daily.   No facility-administered encounter medications on file as of 02/26/2020.    Review of Systems  Constitutional: Negative for appetite change, chills, fatigue and fever.  HENT: Negative for congestion, rhinorrhea, sinus pressure, sinus pain, sneezing and sore throat.   Respiratory: Positive for shortness of breath and wheezing. Negative for cough and chest tightness.   Cardiovascular: Negative for chest pain, palpitations and leg swelling.       Chest tightness on mid chest   Skin: Negative for color change, pallor and rash.  Neurological: Negative for dizziness, light-headedness and headaches.    Immunization History  Administered Date(s) Administered  . Fluad Quad(high Dose 65+) 05/16/2019  . Influenza,inj,Quad PF,6+ Mos 06/17/2017, 06/09/2018  . Influenza-Unspecified 06/11/2016  . PFIZER SARS-COV-2 Vaccination 10/20/2019, 11/14/2019  . Pneumococcal Conjugate-13 05/20/2018  . Pneumococcal Polysaccharide-23 02/15/2017  . Tdap 09/21/2017  . Zoster Recombinat (Shingrix) 07/19/2018   Pertinent  Health Maintenance Due  Topic Date Due  . INFLUENZA VACCINE  04/14/2020  . MAMMOGRAM  02/07/2022  . COLONOSCOPY  11/03/2023  . DEXA SCAN  Completed  . PNA vac Low Risk Adult  Completed   Fall Risk  02/26/2020 01/30/2020 01/12/2020 12/22/2019 10/09/2019  Falls in the past year? 0 0 0 0 1  Number falls in past yr: 0 0 0 0 0  Injury with Fall? 0 0 0 0 0  Follow up - - - - Falls evaluation completed    Vitals:   02/26/20 1456  BP: (!) 150/98  Pulse: 72  Resp: 16  Temp: (!) 97.3 F (36.3  C)  SpO2: 93%  Weight: 173 lb 9.6 oz (78.7 kg)  Height: 5\' 8"  (1.727 m)   Body mass index is 26.4 kg/m. Physical Exam Vitals reviewed.  Constitutional:      General: She is not in acute distress.    Appearance: She is overweight. She is not ill-appearing.  HENT:     Mouth/Throat:     Mouth: Mucous membranes are moist.     Pharynx: Oropharynx is clear. No oropharyngeal exudate or posterior oropharyngeal erythema.  Eyes:     General: No scleral icterus.       Right eye: No discharge.        Left eye: No discharge.     Conjunctiva/sclera: Conjunctivae normal.     Pupils: Pupils are equal, round, and reactive to light.  Cardiovascular:     Rate and Rhythm: Normal rate and regular rhythm.     Pulses: Normal pulses.     Heart sounds: Normal heart sounds. No murmur heard.  No friction rub. No gallop.   Pulmonary:     Effort: Tachypnea and accessory muscle usage present. No respiratory  distress.     Breath sounds: Wheezing present. No rales.  Chest:     Chest wall: No tenderness.  Abdominal:     General: Bowel sounds are normal. There is no distension.     Palpations: Abdomen is soft. There is no mass.     Tenderness: There is no abdominal tenderness. There is no right CVA tenderness, left CVA tenderness, guarding or rebound.  Musculoskeletal:        General: No swelling or tenderness. Normal range of motion.     Right lower leg: No edema.     Left lower leg: No edema.  Skin:    General: Skin is warm.     Coloration: Skin is not pale.     Findings: No bruising, erythema or rash.  Neurological:     Mental Status: She is alert and oriented to person, place, and time.     Cranial Nerves: No cranial nerve deficit.     Sensory: No sensory deficit.     Motor: No weakness.     Coordination: Coordination normal.     Gait: Gait normal.  Psychiatric:        Mood and Affect: Mood normal.        Behavior: Behavior normal.        Thought Content: Thought content normal.         Judgment: Judgment normal.    Labs reviewed: Recent Labs    02/02/20 2204 02/03/20 0511 02/04/20 0720  NA 140 141 138  K 3.7 2.9* 4.6  CL 101 101 102  CO2 29 24 29   GLUCOSE 105* 195* 148*  BUN 19 17 15   CREATININE 0.99 1.21* 0.91  CALCIUM 9.5 9.4 9.5  MG  --  2.3 2.4   Recent Labs    01/02/20 1049 01/08/20 0842 02/03/20 0511  AST 20 18 31   ALT 28 25 27   ALKPHOS  --   --  46  BILITOT 0.8 1.0 0.6  PROT 6.4 6.2 7.5  ALBUMIN  --   --  4.2   Recent Labs    01/02/20 1049 01/02/20 1049 01/08/20 0842 02/02/20 2204 02/03/20 0511  WBC 13.2*   < > 6.4 8.8 10.1  NEUTROABS 8,884*  --  2,317  --  8.0*  HGB 13.9   < > 13.5 14.8 15.2*  HCT 41.7   < > 41.1 43.2 46.0  MCV 91.9   < > 92.2 92.1 95.6  PLT 301   < > 251 274 295   < > = values in this interval not displayed.   Lab Results  Component Value Date   TSH 1.43 08/16/2017   No results found for: HGBA1C Lab Results  Component Value Date   CHOL 141 01/08/2020   HDL 44 (L) 01/08/2020   LDLCALC 78 01/08/2020   TRIG 102 01/08/2020   CHOLHDL 3.2 01/08/2020    Significant Diagnostic Results in last 30 days:  DG Chest Portable 1 View  Result Date: 02/02/2020 CLINICAL DATA:  Short of breath for 1 week, asthma EXAM: PORTABLE CHEST 1 VIEW COMPARISON:  12/27/2019 FINDINGS: Two frontal views of the chest demonstrate a stable cardiac silhouette. Minimal atherosclerosis of the aortic arch unchanged. No airspace disease, effusion, or pneumothorax. No acute bony abnormalities. IMPRESSION: 1. Stable exam, no acute process. Electronically Signed   By: Randa Ngo M.D.   On: 02/02/2020 22:35   MM Digital Screening  Result Date: 02/08/2020 CLINICAL DATA:  Screening. EXAM: DIGITAL SCREENING BILATERAL MAMMOGRAM WITH  CAD COMPARISON:  Previous exam(s). ACR Breast Density Category b: There are scattered areas of fibroglandular density. FINDINGS: In the right breast, a possible mass warrants further evaluation. In the left breast, no  findings suspicious for malignancy. Images were processed with CAD. IMPRESSION: Further evaluation is suggested for possible mass in the right breast. RECOMMENDATION: Diagnostic mammogram and possibly ultrasound of the right breast. (Code:[id number]-[id number]) The patient will be contacted regarding the findings, and additional imaging will be scheduled. BI-RADS CATEGORY  0: Incomplete. Need additional imaging evaluation and/or prior mammograms for comparison. Electronically Signed   By: Evangeline Dakin M.D.   On: 02/08/2020 10:50   MM DIAG BREAST TOMO UNI RIGHT  Result Date: 02/21/2020 CLINICAL DATA:  Possible right breast mass on a recent 2D screening mammogram. EXAM: DIGITAL DIAGNOSTIC UNILATERAL RIGHT MAMMOGRAM WITH CAD AND TOMO COMPARISON:  Previous exam(s). ACR Breast Density Category b: There are scattered areas of fibroglandular density. FINDINGS: 3D tomographic and 2D generated images of the right breast were obtained with a marker placed on a small epidermal inclusion cyst in the medial aspect of the breast which the patient reports has been present for many years. These demonstrate that the suspected mass seen on the screening mammogram in the craniocaudal projection corresponds to the small epidermal inclusion cyst. Suspected mass seen in the superior aspect of the breast in the oblique projection of the screening mammogram is shown to represented overlapping normal vessels and fibroglandular tissue. Mammographic images were processed with CAD. IMPRESSION: Small epidermal inclusion cyst in the skin in the medial right breast. No evidence of malignancy. RECOMMENDATION: Bilateral screening mammogram in 1 year. I have discussed the findings and recommendations with the patient. If applicable, a reminder letter will be sent to the patient regarding the next appointment. BI-RADS CATEGORY  2: Benign. Electronically Signed   By: Claudie Revering M.D.   On: 02/21/2020 09:05    Assessment/Plan   Mild persistent asthma  with acute exacerbation Afebrile.Tachypnea with use of accessory muscle noted.Bilateral lung wheezes.Sits by leaning forward easy breathing.current inhalers ineffective.Status post 14 days of tapered prednisone 60 mg tablet.given her worsening symptoms recommended evaluation at the ED.Son states prefers to drive patient to ED verse going by EMS.Has his Albuterol inhaler that patient used on the way to office today.Patient left with son in stable condition.   Family/ staff Communication: Reviewed plan of care with patient and son.  Labs/tests ordered: None   Next Appointment: Has upcoming appointment with Nancy Nordmann, NP

## 2020-02-26 NOTE — Patient Instructions (Addendum)
Please go to the ED for further evaluation of Asthma with acute exacerbation

## 2020-02-27 ENCOUNTER — Other Ambulatory Visit: Payer: Self-pay | Admitting: Nurse Practitioner

## 2020-02-27 MED FILL — predniSONE 10 MG TABS: 10 | 20 days supply | Qty: 40 | Fill #0

## 2020-02-27 MED FILL — ALBUTEROL SULFATE HFA 108 (: 108 (90 BAS | 17 days supply | Qty: 18 | Fill #0

## 2020-02-29 MED FILL — BIKTARVY 50-200-25 MG TABS: 50-200-25 | 30 days supply | Qty: 30 | Fill #2

## 2020-03-03 NOTE — ED Provider Notes (Signed)
[facility] EMERGENCY DEPARTMENT Provider Note   CSN: [id number] Arrival date & time: 02/26/20  1556     History Chief Complaint  Patient presents with  . Shortness of Breath    Maria Adkins is a 69 y.o. female.  The history is provided by the patient.  Shortness of Breath Severity:  Mild Onset quality:  Gradual Duration:  2 days Timing:  Constant Progression:  Worsening Chronicity:  Recurrent Context: pollens   Context: not activity, not animal exposure and not known allergens   Relieved by:  Inhaler Worsened by:  Nothing Associated symptoms: no abdominal pain, no chest pain, no fever and no headaches        Past Medical History:  Diagnosis Date  . Asthma   . High blood pressure   . HIV (human immunodeficiency virus infection) ([place])   . Osteoarthritis     Patient Active Problem List   Diagnosis Date Noted  . Asthma exacerbation 02/03/2020  . Acute exacerbation of asthma with allergic rhinitis 02/02/2020  . Seasonal allergies 01/13/2018  . Asthma 02/05/2017  . Allergy 12/16/2016  . 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 Essential hypertension 08/19/2016  . Human immunodeficiency virus (HIV) disease ([place]) 08/19/2016    Past Surgical History:  Procedure Laterality Date  . Biopsy of Liver    . CATARACT EXTRACTION, BILATERAL    . CHOLECYSTECTOMY    . CYST REMOVAL HAND Left   . LAPAROSCOPIC SALPINGOOPHERECTOMY Right      OB History   No obstetric history on file.     Family History  Problem Relation Age of Onset  . Diabetes Mother        died at age 32  . Hypertension Mother   . Asthma Sister   . Diabetes Sister   . Hypertension Sister   . Arthritis Sister   . Diabetes Sister   . Hypertension Sister   . Asthma Sister   . Arthritis Sister   . Asthma Son        controlled    Social History   Tobacco Use  . Smoking status: Former Smoker    Packs/day: 1.00    Years: 40.00    Pack years: 40.00    Types: Cigarettes     Quit date: 09/14/1978    Years since quitting: 41.4  . Smokeless tobacco: Never Used  Vaping Use  . Vaping Use: Never used  Substance Use Topics  . Alcohol use: No  . Drug use: No    Home Medications Prior to Admission medications   Medication Sig Start Date End Date Taking? Authorizing Provider  albuterol (PROVENTIL) (2.5 MG/3ML) 0.083% nebulizer solution Take 3 mLs (2.5 mg total) by nebulization every 4 (four) hours as needed for wheezing or shortness of breath. 12/22/19   Lauree Chandler, NP  albuterol (VENTOLIN HFA) 108 (90 Base) MCG/ACT inhaler INHALE 2 PUFFS BY MOUTH EVERY 4 HOURS AS NEEDED 05/23/19   Lauree Chandler, NP  bictegravir-emtricitabine-tenofovir AF (BIKTARVY) 50-200-25 MG TABS tablet Take 1 tablet by mouth daily. 12/27/19   Kuppelweiser, Cassie L, RPH-CPP  Blood Pressure Monitoring (BLOOD PRESSURE CUFF) MISC 1 Device by Does not apply route daily. DX: I10 07/14/19   Lauree Chandler, NP  budesonide-formoterol (SYMBICORT) 160-4.5 MCG/ACT inhaler Inhale 2 puffs into the lungs in the morning and at bedtime. 01/12/20   Lauree Chandler, NP  Calcium Carbonate (CALCIUM 600 PO) Take 1 tablet by mouth daily.     [provider]  enalapril-hydrochlorothiazide (VASERETIC) 10-25 MG tablet TAKE 1 TABLET BY MOUTH EVERY DAY 02/09/20   Lauree Chandler, NP  fluticasone (FLONASE) 50 MCG/ACT nasal spray Place 2 sprays into both nostrils daily. 01/30/20   Ngetich, Nelda Bucks, NP  levalbuterol (XOPENEX) 0.63 MG/3ML nebulizer solution Take 3 mLs (0.63 mg total) by nebulization every 6 (six) hours as needed for wheezing or shortness of breath. 02/05/20   Lauree Chandler, NP  metoprolol tartrate (LOPRESSOR) 100 MG tablet TAKE 1 TABLET BY MOUTH EVERY DAY 01/19/20   Lauree Chandler, NP  montelukast (SINGULAIR) 10 MG tablet TAKE 1 TABLET BY MOUTH EVERYDAY AT BEDTIME 06/30/19   Lauree Chandler, NP  Multiple Vitamins-Minerals (MULTIVITAMIN GUMMIES ADULT PO) Take 2 tablets by mouth  daily. Gummy    [provider]  Omega-3 Fatty Acids (FISH OIL) 1000 MG CAPS Take by mouth.    [provider]  Polyethyl Glycol-Propyl Glycol (SYSTANE HYDRATION PF OP) Place 1 drop into both eyes daily as needed (For dry eyes).     [provider]  pravastatin (PRAVACHOL) 20 MG tablet TAKE 1 TABLET BY MOUTH EVERY DAY 02/27/20   Lauree Chandler, NP  predniSONE (DELTASONE) 10 MG tablet Take 2 tablets (20 mg total) by mouth daily with breakfast. 02/26/20   Estefany Goebel, Corene Cornea, MD  Saccharomyces boulardii (PROBIOTIC) 250 MG CAPS By mouth daily 01/12/20   Lauree Chandler, NP  VITAMIN D PO Take 1 capsule by mouth daily.    [provider]    Allergies    Patient has no known allergies.  Review of Systems   Review of Systems  Constitutional: Negative for fever.  Respiratory: Positive for shortness of breath.   Cardiovascular: Negative for chest pain.  Gastrointestinal: Negative for abdominal pain.  Neurological: Negative for headaches.  All other systems reviewed and are negative.   Physical Exam Updated Vital Signs BP 137/73   Pulse 83   Temp 98.1 F (36.7 C) (Oral)   Resp (!) 21   Ht 5\' 8"  (1.727 m)   Wt 78.5 kg   SpO2 96%   BMI 26.30 kg/m   Physical Exam Vitals and nursing note reviewed.  Constitutional:      Appearance: She is well-developed.  HENT:     Head: Normocephalic and atraumatic.     Mouth/Throat:     Mouth: Mucous membranes are dry.     Pharynx: Oropharynx is clear.  Eyes:     Pupils: Pupils are equal, round, and reactive to light.  Cardiovascular:     Rate and Rhythm: Normal rate and regular rhythm.     Pulses: Normal pulses.     Heart sounds: Normal heart sounds.  Pulmonary:     Effort: Pulmonary effort is normal. No respiratory distress.     Breath sounds: No stridor. Decreased breath sounds and wheezing present.  Abdominal:     General: There is no distension.  Musculoskeletal:        General: No swelling. Normal  range of motion.     Cervical back: Normal range of motion.  Skin:    General: Skin is warm.  Neurological:     Mental Status: She is alert.     ED Results / Procedures / Treatments   Labs (all labs ordered are listed, but only abnormal results are displayed) Labs Reviewed - No data to display  EKG EKG Interpretation  Date/Time:  Monday February 26 2020 16:09:29 EDT Ventricular Rate:  76 PR Interval:  224 QRS  Duration: 76 QT Interval:  378 QTC Calculation: 425 R Axis:   38 Text Interpretation: Sinus rhythm with 1st degree A-V block Septal infarct , age undetermined Abnormal ECG Confirmed by Merrily Pew [phone]) on 02/26/2020 7:56:28 PM   Radiology No results found.  Procedures Procedures (including critical care time)  Medications Ordered in ED Medications  ipratropium-albuterol (DUONEB) 0.5-2.5 (3) MG/3ML nebulizer solution 3 mL (3 mLs Nebulization Given 02/26/20 2014)  magnesium sulfate IVPB 2 g 50 mL (0 g Intravenous Stopped 02/26/20 2156)  methylPREDNISolone sodium succinate (SOLU-MEDROL) 125 mg/2 mL injection 125 mg (125 mg Intravenous Given 02/26/20 2022)  ipratropium-albuterol (DUONEB) 0.5-2.5 (3) MG/3ML nebulizer solution 3 mL (3 mLs Nebulization Given 02/26/20 2133)  ipratropium-albuterol (DUONEB) 0.5-2.5 (3) MG/3ML nebulizer solution 3 mL (3 mLs Nebulization Given 02/26/20 2156)    ED Course  I have reviewed the triage vital signs and the nursing notes.  Pertinent labs & imaging results that were available during my care of the patient were reviewed by me and considered in my medical decision making (see chart for details).    MDM Rules/Calculators/A&P                          Likely asthma exacerbation without resp distress. Improved with meds as above. No fevers to suggest infection. No hypoxia. No cp to suggest acs or PE. Overall patient significantly improved and stable for discharge.  Final Clinical Impression(s) / ED Diagnoses Final diagnoses:  Mild  intermittent asthma with exacerbation    Rx / DC Orders ED Discharge Orders         Ordered    predniSONE (DELTASONE) 10 MG tablet  Daily with breakfast     Discontinue  Reprint     02/26/20 2249           Mychael Smock, Corene Cornea, MD 03/03/20 2109

## 2020-03-12 ENCOUNTER — Other Ambulatory Visit: Payer: Self-pay

## 2020-03-12 ENCOUNTER — Other Ambulatory Visit: Payer: Self-pay | Admitting: Nurse Practitioner

## 2020-03-12 MED ORDER — PRAVASTATIN SODIUM 20 MG PO TABS: 20.0000 mg | ORAL_TABLET | Freq: Every day | ORAL | 1 refills | Status: DC

## 2020-03-12 MED FILL — PRAVASTATIN NA 20 MG TAB: 20 | 90 days supply | Qty: 90 | Fill #0

## 2020-03-12 NOTE — Telephone Encounter (Signed)
Refill on Pravastatin to be sent to [facility] in [place]

## 2020-03-14 ENCOUNTER — Ambulatory Visit (INDEPENDENT_AMBULATORY_CARE_PROVIDER_SITE_OTHER): Payer: 59 | Admitting: Critical Care Medicine

## 2020-03-14 ENCOUNTER — Other Ambulatory Visit: Payer: Self-pay

## 2020-03-14 ENCOUNTER — Encounter: Payer: Self-pay | Admitting: Critical Care Medicine

## 2020-03-14 ENCOUNTER — Other Ambulatory Visit: Payer: Self-pay | Admitting: Critical Care Medicine

## 2020-03-14 VITALS — BP 142/90 | HR 67 | Ht 69.0 in | Wt 176.4 lb

## 2020-03-14 DIAGNOSIS — J4551 Severe persistent asthma with (acute) exacerbation: Secondary | ICD-10-CM

## 2020-03-14 MED ORDER — PREDNISONE 5 MG PO TABS
5.0000 mg | ORAL_TABLET | Freq: Every day | ORAL | 1 refills | Status: DC
Start: 2020-03-14 — End: 2020-05-31

## 2020-03-14 MED ORDER — CETIRIZINE HCL 10 MG PO TABS: 10.0000 mg | ORAL_TABLET | Freq: Every day | ORAL | 11 refills | Status: DC

## 2020-03-14 MED ORDER — MONTELUKAST SODIUM 10 MG PO TABS
10.0000 mg | ORAL_TABLET | Freq: Every day | ORAL | 11 refills | Status: DC
Start: 2020-03-14 — End: 2020-03-14

## 2020-03-14 MED ORDER — SPIRIVA RESPIMAT 1.25 MCG/ACT IN AERS
2.0000 | INHALATION_SPRAY | Freq: Every day | RESPIRATORY_TRACT | 11 refills | Status: DC
Start: 2020-03-14 — End: 2020-05-31

## 2020-03-14 MED ORDER — FLUTICASONE-SALMETEROL 500-50 MCG/DOSE IN AEPB
1.0000 | INHALATION_SPRAY | Freq: Two times a day (BID) | RESPIRATORY_TRACT | 11 refills | Status: DC
Start: 2020-03-14 — End: 2020-07-30

## 2020-03-14 MED FILL — predniSONE 5 MG TABS: 5 | 40 days supply | Qty: 40 | Fill #0

## 2020-03-14 MED FILL — ADVAIR 500/50 DISKUS: 500-50 | 30 days supply | Qty: 60 | Fill #0

## 2020-03-14 MED FILL — MONTELUKAST SOD 10 MG TAB: 10 | 30 days supply | Qty: 30 | Fill #0

## 2020-03-14 MED FILL — SPIRIVA RESPIMAT 1.25 MCG I: 1.25 | 30 days supply | Qty: 4 | Fill #0

## 2020-03-14 MED FILL — CETIRIZINE HCL 10 MG TABS: 10 | 100 days supply | Qty: 100 | Fill #0

## 2020-03-14 NOTE — Patient Instructions (Addendum)
Thank you for visiting Dr. Carlis Abbott at [facility]. We recommend the following: Orders Placed This Encounter  Procedures  . Pulmonary Function Test   Orders Placed This Encounter  Procedures  . Pulmonary Function Test    Standing Status:   Future    Standing Expiration Date:   03/14/2021    Order Specific Question:   Where should this test be performed?    Answer:   [facility]    Order Specific Question:   Full PFT: includes the following: basic spirometry, spirometry pre & post bronchodilator, diffusion capacity (DLCO), lung volumes    Answer:   Full PFT    Meds ordered this encounter  Medications  . cetirizine (ZYRTEC) 10 MG tablet    Sig: Take 1 tablet (10 mg total) by mouth daily.    Dispense:  30 tablet    Refill:  11  . Fluticasone-Salmeterol (ADVAIR DISKUS) 500-50 MCG/DOSE AEPB    Sig: Inhale 1 puff into the lungs 2 (two) times daily.    Dispense:  60 each    Refill:  11  . Tiotropium Bromide Monohydrate (SPIRIVA RESPIMAT) 1.25 MCG/ACT AERS    Sig: Inhale 2 puffs into the lungs daily.    Dispense:  4 g    Refill:  11  . montelukast (SINGULAIR) 10 MG tablet    Sig: Take 1 tablet (10 mg total) by mouth at bedtime.    Dispense:  30 tablet    Refill:  11  . predniSONE (DELTASONE) 5 MG tablet    Sig: Take 1 tablet (5 mg total) by mouth daily with breakfast.    Dispense:  40 tablet    Refill:  1     -Start Advair 2 times daily. Rinse your mouth mouth after every use.  -When you start Advair, stop Symbicort -Keep taking singulair daily -start taking Zyrtec (cetirizine) daily -Keep taking flonase daily as tolerated.  -Start taking Spiriva inhaler once daily.  Steroid taper: Prednisone 15 mg for 5 days, then prednisone 10mg  for 5 days, then Prednisone 7.5 mg for 5 days, then Prednisone 5mg  for 5 days, then stop. (if you are getting worse during your taper down, resume the previous dose that had your symptoms controlled)  The new medicine we are going to  plan on starting is called Dupixent.     Return in about 3 months (around 06/14/2020). after PFTs    Please do your part to reduce the spread of COVID-19.

## 2020-03-14 NOTE — Progress Notes (Signed)
Synopsis: Referred in June 2021 for asthma by Lauree Chandler, NP.  Subjective:   [name] GENDER: female DOB: 04-11-1951, MRN: [id number]  Chief Complaint  Patient presents with  . Consult    Patient has shortness of breath and wheezing all the time and is worse with exertion. Dry cough that started today. Hasn't used rescue inhaler recently. Hot weather/humidity makes it worse    Maria Adkins is a 69 year old woman with a history of HIV who presents for evaluation of severe persistent asthma.  She has had multiple ED visits recently due to asthma exacerbations.  Her main symptoms are shortness of breath, wheezing, chest tightness, nocturnal symptoms.  She has had a dry cough as well.  She was diagnosed with asthma around age 65, and it was always well controlled when she was in [place], although at times this was on daily prednisone as high as 60 mg/day.  Since moving to [place] in 2017 she has developed worse sinus allergy symptoms and had progressively worsening asthma control.  She has postnasal drip with frequent throat clearing.  Her current maintenance medications are Singulair daily, Symbicort 2 puffs twice daily, albuterol nebulizers and rescue inhaler.  She gets significant side effects from using her albuterol inhaler.  She takes Flonase as often as she can, but sometimes gets nosebleeds from it.  She is not currently on antihistamine, but previously was taking Zyrtec.  She is currently on prednisone 20 mg daily since her last ED visit on 6/14.  Since being on prednisone she has used her albuterol very infrequently.  She has three more days of prednisone ordered.  Her breathing is worse with hot and humid weather and around strong smells like perfumes and smoke.  Several family members have asthma, including her sister, multiple nieces and nephews, and her cousin.  She has had seasonal allergies her entire life.  She is a former smoker who quit 40 years ago after  1 pack/day x 14 years.  She is very compliant with her medications.      Past Medical History:  Diagnosis Date  . Asthma   . High blood pressure   . HIV (human immunodeficiency virus infection) ([place])   . Osteoarthritis      Family History  Problem Relation Age of Onset  . Diabetes Mother        died at age 7  . Hypertension Mother   . Asthma Sister   . Diabetes Sister   . Hypertension Sister   . Arthritis Sister   . Diabetes Sister   . Hypertension Sister   . Asthma Sister   . Arthritis Sister   . Asthma Son        controlled     Past Surgical History:  Procedure Laterality Date  . Biopsy of Liver    . CATARACT EXTRACTION, BILATERAL    . CHOLECYSTECTOMY    . CYST REMOVAL HAND Left   . LAPAROSCOPIC SALPINGOOPHERECTOMY Right     Social History   Socioeconomic History  . Marital status: Married    Spouse name: Not on file  . Number of children: Not on file  . Years of education: Not on file  . Highest education level: Not on file  Occupational History  . Not on file  Tobacco Use  . Smoking status: Former Smoker    Packs/day: 1.00    Years: 14.00    Pack years: 14.00    Types: Cigarettes  Quit date: 09/14/1978    Years since quitting: 41.5  . Smokeless tobacco: Never Used  Vaping Use  . Vaping Use: Never used  Substance and Sexual Activity  . Alcohol use: No  . Drug use: No  . Sexual activity: Yes    Partners: Male    Birth control/protection: Condom    Comment: declined condoms  Other Topics Concern  . Not on file  Social History Narrative   Diet? Regular      Do you drink/eat things with caffeine? Coffee      Marital status?  Married                                  What year were you married? March 16, 2009      Do you live in a house, apartment, assisted living, condo, trailer, etc.? House      Is it one or more stories? 1 story      How many persons live in your home? 2      Do you have any pets in your home? (please list) yes-dog       Current or past profession: Administrator      Do you exercise?  no                                    Type & how often? n/a      Do you have a living will? no      Do you have a DNR form?     no                             If not, do you want to discuss one? Yes      Do you have signed POA/HPOA for forms? no      Social Determinants of Health   Financial Resource Strain:   . Difficulty of Paying Living Expenses:   Food Insecurity:   . Worried About Charity fundraiser in the Last Year:   . Arboriculturist in the Last Year:   Transportation Needs:   . Film/video editor (Medical):   [name] Lack of Transportation (Non-Medical):   Physical Activity:   . Days of Exercise per Week:   . Minutes of Exercise per Session:   Stress:   . Feeling of Stress :   Social Connections:   . Frequency of Communication with Friends and Family:   . Frequency of Social Gatherings with Friends and Family:   . Attends Religious Services:   . Active Member of Clubs or Organizations:   . Attends Archivist Meetings:   [name] Marital Status:   Intimate Partner Violence:   . Fear of Current or Ex-Partner:   . Emotionally Abused:   [name] Physically Abused:   . Sexually Abused:      No Known Allergies   Immunization History  Administered Date(s) Administered  . Fluad Quad(high Dose 65+) 05/16/2019  . Influenza,inj,Quad PF,6+ Mos 06/17/2017, 06/09/2018  . Influenza-Unspecified 06/11/2016  . PFIZER SARS-COV-2 Vaccination 10/20/2019, 11/14/2019  . Pneumococcal Conjugate-13 05/20/2018  . Pneumococcal Polysaccharide-23 02/15/2017  . Tdap 09/21/2017  . Zoster Recombinat (Shingrix) 07/19/2018    Outpatient Medications Prior to Visit  Medication Sig Dispense Refill  . albuterol (PROVENTIL) (2.5 MG/3ML) 0.083%  nebulizer solution Take 3 mLs (2.5 mg total) by nebulization every 4 (four) hours as needed for wheezing or shortness of breath. 150 mL 6  . albuterol (VENTOLIN HFA) 108 (90 Base) MCG/ACT  inhaler INHALE 2 PUFFS BY MOUTH EVERY 4 HOURS AS NEEDED 18 g 2  . bictegravir-emtricitabine-tenofovir AF (BIKTARVY) 50-200-25 MG TABS tablet Take 1 tablet by mouth daily. 30 tablet 5  . Blood Pressure Monitoring (BLOOD PRESSURE CUFF) MISC 1 Device by Does not apply route daily. DX: I10 1 each 0  . budesonide-formoterol (SYMBICORT) 160-4.5 MCG/ACT inhaler Inhale 2 puffs into the lungs in the morning and at bedtime. 30.6 Inhaler 1  . Calcium Carbonate (CALCIUM 600 PO) Take 1 tablet by mouth daily.     . enalapril-hydrochlorothiazide (VASERETIC) 10-25 MG tablet TAKE 1 TABLET BY MOUTH EVERY DAY 90 tablet 1  . fluticasone (FLONASE) 50 MCG/ACT nasal spray Place 2 sprays into both nostrils daily. 16 g 6  . metoprolol tartrate (LOPRESSOR) 100 MG tablet TAKE 1 TABLET BY MOUTH EVERY DAY 90 tablet 1  . Multiple Vitamins-Minerals (MULTIVITAMIN GUMMIES ADULT PO) Take 2 tablets by mouth daily. Gummy    . Omega-3 Fatty Acids (FISH OIL) 1000 MG CAPS Take by mouth.    Vladimir Faster Glycol-Propyl Glycol (SYSTANE HYDRATION PF OP) Place 1 drop into both eyes daily as needed (For dry eyes).     . pravastatin (PRAVACHOL) 20 MG tablet Take 1 tablet (20 mg total) by mouth daily. 90 tablet 1  . predniSONE (DELTASONE) 10 MG tablet Take 2 tablets (20 mg total) by mouth daily with breakfast. 40 tablet 0  . Saccharomyces boulardii (PROBIOTIC) 250 MG CAPS By mouth daily 60 capsule   . VITAMIN D PO Take 1 capsule by mouth daily.    [name] levalbuterol (XOPENEX) 0.63 MG/3ML nebulizer solution Take 3 mLs (0.63 mg total) by nebulization every 6 (six) hours as needed for wheezing or shortness of breath. 3 mL 12  . montelukast (SINGULAIR) 10 MG tablet TAKE 1 TABLET BY MOUTH EVERYDAY AT BEDTIME 90 tablet 1   No facility-administered medications prior to visit.    Review of Systems  Constitutional: Negative for chills and fever.  HENT: Positive for congestion and nosebleeds.   Respiratory: Positive for cough, shortness of breath and  wheezing. Negative for hemoptysis.   Cardiovascular: Negative for chest pain and leg swelling.  Gastrointestinal: Negative for heartburn, nausea and vomiting.  Skin: Negative for rash.  Endo/Heme/Allergies: Positive for environmental allergies.     Objective:   Vitals:   03/14/20 1553  BP: (!) 142/90  Pulse: 67  SpO2: 95%  Weight: 176 lb 6.4 oz (80 kg)  Height: 5\' 9"  (1.753 m)   95% on  RA BMI Readings from Last 3 Encounters:  03/14/20 26.05 kg/m  02/26/20 26.30 kg/m  02/26/20 26.40 kg/m   Wt Readings from Last 3 Encounters:  03/14/20 176 lb 6.4 oz (80 kg)  02/26/20 173 lb (78.5 kg)  02/26/20 173 lb 9.6 oz (78.7 kg)    Physical Exam Vitals reviewed.  Constitutional:      General: She is not in acute distress.    Appearance: She is not ill-appearing.  HENT:     Head: Normocephalic and atraumatic.  Eyes:     General: No scleral icterus. Cardiovascular:     Rate and Rhythm: Normal rate and regular rhythm.     Heart sounds: No murmur heard.   Pulmonary:     Comments: Breathing comfortably on room air, no conversational  dyspnea.  No coughing with deep inhalation.  Clear to auscultation bilaterally. Abdominal:     General: There is no distension.     Palpations: Abdomen is soft.     Tenderness: There is no abdominal tenderness.  Musculoskeletal:        General: No swelling or deformity.     Cervical back: Neck supple.  Lymphadenopathy:     Cervical: No cervical adenopathy.  Skin:    General: Skin is warm and dry.     Findings: No rash.  Neurological:     General: No focal deficit present.     Mental Status: She is alert.     Coordination: Coordination normal.  Psychiatric:        Behavior: Behavior normal.        Thought Content: Thought content normal.      CBC    Component Value Date/Time   WBC 10.1 02/03/2020 0511   RBC 4.81 02/03/2020 0511   HGB 15.2 (H) 02/03/2020 0511   HCT 46.0 02/03/2020 0511   PLT 295 02/03/2020 0511   MCV 95.6  02/03/2020 0511   MCH 31.6 02/03/2020 0511   MCHC 33.0 02/03/2020 0511   RDW 13.3 02/03/2020 0511   LYMPHSABS 1.7 02/03/2020 0511   MONOABS 0.2 02/03/2020 0511   EOSABS 0.0 02/03/2020 0511   BASOSABS 0.1 02/03/2020 0511   Eos 500 on 12/27/2019 eos 294 on 01/08/2020  CHEMISTRY No results for input(s): NA, K, CL, CO2, GLUCOSE, BUN, CREATININE, CALCIUM, MG, PHOS in the last 168 hours. CrCl cannot be calculated (Patient's most recent lab result is older than the maximum 21 days allowed.).   Chest Imaging- films reviewed: CXR, two view 02/26/2020-increased lung markings bilaterally, mildly increased retrosternal airspace.  Pulmonary Functions Testing Results: No flowsheet data found.      Assessment & Plan:     ICD-10-CM   1. Severe persistent asthma with acute exacerbation  J45.51 Pulmonary Function Test    Severe persistent allergic asthma -Increase LABA-ICS to high-dose Advair two times daily.  Reviewed inhaler to technique.  Rinse her mouth after every use. -Adding Spiriva once daily. -Adding Dupixent.  Paperwork completed today. -Continue Singulair daily. -Taper off steroids slowly. Steroid taper: Prednisone 15 mg for 5 days, then prednisone 10mg  for 5 days, then Prednisone 7.5 mg for 5 days, then Prednisone 5mg  for 5 days, then stop. (if you are getting worse during your taper down, resume the previous dose that had your symptoms controlled) -Continue allergic rhinosinusitis management. -We will avoid checking IgE today as she is on steroids.   Allergic rhinosinusitis -Continue Flonase as tolerated -Restart cetirizine once daily -Continue Singulair daily  RTC in 3 months.    Current Outpatient Medications:  .  albuterol (PROVENTIL) (2.5 MG/3ML) 0.083% nebulizer solution, Take 3 mLs (2.5 mg total) by nebulization every 4 (four) hours as needed for wheezing or shortness of breath., Disp: 150 mL, Rfl: 6 .  albuterol (VENTOLIN HFA) 108 (90 Base) MCG/ACT inhaler,  INHALE 2 PUFFS BY MOUTH EVERY 4 HOURS AS NEEDED, Disp: 18 g, Rfl: 2 .  bictegravir-emtricitabine-tenofovir AF (BIKTARVY) 50-200-25 MG TABS tablet, Take 1 tablet by mouth daily., Disp: 30 tablet, Rfl: 5 .  Blood Pressure Monitoring (BLOOD PRESSURE CUFF) MISC, 1 Device by Does not apply route daily. DX: I10, Disp: 1 each, Rfl: 0 .  budesonide-formoterol (SYMBICORT) 160-4.5 MCG/ACT inhaler, Inhale 2 puffs into the lungs in the morning and at bedtime., Disp: 30.6 Inhaler, Rfl: 1 .  Calcium Carbonate (CALCIUM  600 PO), Take 1 tablet by mouth daily. , Disp: , Rfl:  .  enalapril-hydrochlorothiazide (VASERETIC) 10-25 MG tablet, TAKE 1 TABLET BY MOUTH EVERY DAY, Disp: 90 tablet, Rfl: 1 .  fluticasone (FLONASE) 50 MCG/ACT nasal spray, Place 2 sprays into both nostrils daily., Disp: 16 g, Rfl: 6 .  metoprolol tartrate (LOPRESSOR) 100 MG tablet, TAKE 1 TABLET BY MOUTH EVERY DAY, Disp: 90 tablet, Rfl: 1 .  Multiple Vitamins-Minerals (MULTIVITAMIN GUMMIES ADULT PO), Take 2 tablets by mouth daily. Gummy, Disp: , Rfl:  .  Omega-3 Fatty Acids (FISH OIL) 1000 MG CAPS, Take by mouth., Disp: , Rfl:  .  Polyethyl Glycol-Propyl Glycol (SYSTANE HYDRATION PF OP), Place 1 drop into both eyes daily as needed (For dry eyes). , Disp: , Rfl:  .  pravastatin (PRAVACHOL) 20 MG tablet, Take 1 tablet (20 mg total) by mouth daily., Disp: 90 tablet, Rfl: 1 .  predniSONE (DELTASONE) 10 MG tablet, Take 2 tablets (20 mg total) by mouth daily with breakfast., Disp: 40 tablet, Rfl: 0 .  Saccharomyces boulardii (PROBIOTIC) 250 MG CAPS, By mouth daily, Disp: 60 capsule, Rfl:  .  VITAMIN D PO, Take 1 capsule by mouth daily., Disp: , Rfl:  .  cetirizine (ZYRTEC) 10 MG tablet, Take 1 tablet (10 mg total) by mouth daily., Disp: 30 tablet, Rfl: 11 .  Fluticasone-Salmeterol (ADVAIR DISKUS) 500-50 MCG/DOSE AEPB, Inhale 1 puff into the lungs 2 (two) times daily., Disp: 60 each, Rfl: 11 .  montelukast (SINGULAIR) 10 MG tablet, Take 1 tablet (10 mg  total) by mouth at bedtime., Disp: 30 tablet, Rfl: 11 .  predniSONE (DELTASONE) 5 MG tablet, Take 1 tablet (5 mg total) by mouth daily with breakfast., Disp: 40 tablet, Rfl: 1 .  Tiotropium Bromide Monohydrate (SPIRIVA RESPIMAT) 1.25 MCG/ACT AERS, Inhale 2 puffs into the lungs daily., Disp: 4 g, Rfl: [place] Red[place] Cloe, DO [facility] 03/14/2020 4:25 PM

## 2020-03-26 ENCOUNTER — Telehealth: Payer: Self-pay | Admitting: Pharmacy Technician

## 2020-03-26 NOTE — Telephone Encounter (Signed)
Received New start paperwork for DUPIXENT. Will update as we work through the benefits process. 

## 2020-03-26 NOTE — Telephone Encounter (Signed)
Submitted a Prior Authorization request to [facility] for [place] via Cover My Meds. Will update once we receive a response.   (Key: [id number])

## 2020-03-27 NOTE — Telephone Encounter (Signed)
Patient will receive medication from [place] Patient Assistance.

## 2020-03-27 NOTE — Telephone Encounter (Signed)
Noted, thank you

## 2020-03-27 NOTE — Telephone Encounter (Signed)
Received a fax from [place] regarding an Approval for [employer] patient assistance from 03/26/20 to 09/13/20.   Phone number: [phone]

## 2020-03-27 NOTE — Telephone Encounter (Signed)
Is there a pharmacy restriction with the pt's insurance?

## 2020-04-01 ENCOUNTER — Telehealth: Payer: Self-pay

## 2020-04-01 NOTE — Telephone Encounter (Signed)
Received request from [employer] that PA is needed in order to bill primary insurance.   Form completed and faxed records to [phone]. Eugenia Mcalpine

## 2020-04-04 MED FILL — BIKTARVY 50-200-25 MG TABS: 50-200-25 | 30 days supply | Qty: 30 | Fill #3

## 2020-04-08 MED FILL — SPIRIVA RESPIMAT 1.25 MCG I: 1.25 | 30 days supply | Qty: 4 | Fill #1

## 2020-04-22 ENCOUNTER — Ambulatory Visit (INDEPENDENT_AMBULATORY_CARE_PROVIDER_SITE_OTHER): Payer: 59 | Admitting: Critical Care Medicine

## 2020-04-22 ENCOUNTER — Other Ambulatory Visit: Payer: Self-pay | Admitting: *Deleted

## 2020-04-22 ENCOUNTER — Other Ambulatory Visit: Payer: Self-pay

## 2020-04-22 ENCOUNTER — Telehealth: Payer: Self-pay

## 2020-04-22 DIAGNOSIS — J4551 Severe persistent asthma with (acute) exacerbation: Secondary | ICD-10-CM

## 2020-04-22 LAB — PULMONARY FUNCTION TEST
DL/VA % pred: 106 %
DL/VA: 4.29 ml/min/mmHg/L
DLCO cor % pred: 88 %
DLCO cor: 19.68 ml/min/mmHg
DLCO unc % pred: 88 %
DLCO unc: 19.68 ml/min/mmHg
FEF 25-75 Post: 1.38 L/sec
FEF 25-75 Pre: 0.76 L/sec
FEF2575-%Change-Post: 80 %
FEF2575-%Pred-Post: 70 %
FEF2575-%Pred-Pre: 39 %
FEV1-%Change-Post: 19 %
FEV1-%Pred-Post: 83 %
FEV1-%Pred-Pre: 69 %
FEV1-Post: 1.81 L
FEV1-Pre: 1.52 L
FEV1FVC-%Change-Post: 16 %
FEV1FVC-%Pred-Pre: 79 %
FEV6-%Change-Post: 3 %
FEV6-%Pred-Post: 94 %
FEV6-%Pred-Pre: 91 %
FEV6-Post: 2.55 L
FEV6-Pre: 2.46 L
FEV6FVC-%Change-Post: 0 %
FEV6FVC-%Pred-Post: 103 %
FEV6FVC-%Pred-Pre: 102 %
FVC-%Change-Post: 2 %
FVC-%Pred-Post: 91 %
FVC-%Pred-Pre: 88 %
FVC-Post: 2.55 L
FVC-Pre: 2.48 L
Post FEV1/FVC ratio: 71 %
Post FEV6/FVC ratio: 100 %
Pre FEV1/FVC ratio: 61 %
Pre FEV6/FVC Ratio: 99 %
RV % pred: 93 %
RV: 2.25 L
TLC % pred: 84 %
TLC: 4.78 L

## 2020-04-22 MED ORDER — EPINEPHRINE 0.3 MG/0.3ML IJ SOAJ
0.3000 mg | Freq: Once | INTRAMUSCULAR | 0 refills | Status: AC
Start: 2020-04-22 — End: 2020-04-22

## 2020-04-22 MED FILL — EPINEPHRINE 0.3 MG AUTO-INJ: 0.3 | 2 days supply | Qty: 2 | Fill #0

## 2020-04-22 MED FILL — MONTELUKAST SOD 10 MG TAB: 10 | 30 days supply | Qty: 30 | Fill #1

## 2020-04-22 NOTE — Progress Notes (Signed)
Full PFT performed today. °

## 2020-04-22 NOTE — Telephone Encounter (Signed)
LMTCB x1 for pt.  

## 2020-04-23 MED FILL — ADVAIR 500/50 DISKUS: 500-50 | 30 days supply | Qty: 60 | Fill #1

## 2020-04-24 NOTE — Telephone Encounter (Signed)
LMTCB x2 for pt 

## 2020-04-26 NOTE — Telephone Encounter (Signed)
LMTCB x3 for pt. I have attempted to contact pt several times with no success or call back from pt. Per protocol, message will be closed.

## 2020-04-29 NOTE — Telephone Encounter (Signed)
Patient returned call.  Patient has epi pen and has received  Dupixent delivery at home.  Patient aware to bring Dupixent and Epipen to injection appointment. Patient scheduled for 1st Dupixent injection 05/14/20, at 1430.  Nothing further at this time.

## 2020-04-30 MED FILL — METOPROLOL TARTRATE 100 MG: 100 | 90 days supply | Qty: 90 | Fill #0

## 2020-05-02 MED FILL — BIKTARVY 50-200-25 MG TABS: 50-200-25 | 30 days supply | Qty: 30 | Fill #4

## 2020-05-06 ENCOUNTER — Telehealth: Payer: Self-pay

## 2020-05-06 NOTE — Telephone Encounter (Signed)
Patient called office today regarding covid booster. Informed patient based on her last labs and CDC guidelines booster is not needed as of right now. Advised she contact PCP as well. [place]

## 2020-05-07 ENCOUNTER — Other Ambulatory Visit: Payer: Self-pay | Admitting: Nurse Practitioner

## 2020-05-07 MED FILL — SPIRIVA RESPIMAT 1.25 MCG I: 1.25 | 30 days supply | Qty: 4 | Fill #2

## 2020-05-08 ENCOUNTER — Telehealth: Payer: Self-pay | Admitting: Critical Care Medicine

## 2020-05-09 NOTE — Telephone Encounter (Signed)
Called and spoke with Patient.  Patient Dupxient injection changed to 05/17/20. Patient is aware of 2 hours office protocol, and to bring Epi pen. Nothing further at this time.

## 2020-05-14 ENCOUNTER — Ambulatory Visit: Payer: 59

## 2020-05-17 ENCOUNTER — Ambulatory Visit (INDEPENDENT_AMBULATORY_CARE_PROVIDER_SITE_OTHER): Payer: 59

## 2020-05-17 ENCOUNTER — Other Ambulatory Visit: Payer: Self-pay

## 2020-05-17 DIAGNOSIS — J4551 Severe persistent asthma with (acute) exacerbation: Secondary | ICD-10-CM | POA: Diagnosis not present

## 2020-05-17 MED ORDER — DUPILUMAB 300 MG/2ML ~~LOC~~ SOSY
600.0000 mg | PREFILLED_SYRINGE | Freq: Once | SUBCUTANEOUS | Status: AC
Start: 2020-05-17 — End: 2020-05-17
  Administered 2020-05-17: 600 mg via SUBCUTANEOUS

## 2020-05-17 NOTE — Progress Notes (Signed)
Patient presented to the office today for first-time Dupixent injection.  Primary Pulmonologist: Ernest Mallick DO Medication name: Dupixent Strength: 600mg  loading dose Site(s): right arm and left arm  Epi pen/Auvi-Q visible during appointment: Yes  Time of injection: 1345  Patient evaluated every 15-20 minutes per protocol x2 hours.  1st check: 1400 Evaluation:Patient sitting quietly.  Patient denies any problems. No redness or swelling at right or left injection sites.  2nd check: 1415  Evaluation: Patient sitting quietly.  Patient denies any problems.   3rd check: 1430  Evaluation: Patient denies any side or adverse effects.  No redness or swelling at right or left  injection sites.  4th check: 1445   Evaluation: Patient sitting quietly.  Patient denies any problems.  5th check: 1500  Evaluation: Patient sitting quietly. Denies any problems or discomfort. No redness or swelling at injection sites.  6th check: 1515  Evaluation: Patient sitting quietly.  Patient denies any problems.  7th check: 1530  Evaluation:  Patient sitting quietly.  Patient denies any side or adverse effects. No redness or swelling at right or left injection  sites.   Patient instructed on Epipen use, and signs of side, or adverse effects. Patient instructed on when to seek emergency assistance.

## 2020-05-21 MED FILL — MONTELUKAST SOD 10 MG TAB: 10 | 30 days supply | Qty: 30 | Fill #2

## 2020-05-22 ENCOUNTER — Encounter: Payer: Self-pay | Admitting: Critical Care Medicine

## 2020-05-22 ENCOUNTER — Other Ambulatory Visit: Payer: Self-pay

## 2020-05-22 ENCOUNTER — Ambulatory Visit (INDEPENDENT_AMBULATORY_CARE_PROVIDER_SITE_OTHER): Payer: 59 | Admitting: Critical Care Medicine

## 2020-05-22 VITALS — BP 132/78 | HR 72 | Temp 98.2°F | Ht 69.0 in | Wt 178.6 lb

## 2020-05-22 DIAGNOSIS — J309 Allergic rhinitis, unspecified: Secondary | ICD-10-CM | POA: Diagnosis not present

## 2020-05-22 DIAGNOSIS — J4550 Severe persistent asthma, uncomplicated: Secondary | ICD-10-CM

## 2020-05-22 NOTE — Patient Instructions (Addendum)
Thank you for visiting Dr. Carlis Abbott at [facility]. We recommend the following: Orders Placed This Encounter  Procedures  . Spirometry with graph   Orders Placed This Encounter  Procedures  . Spirometry with graph    Standing Status:   Future    Standing Expiration Date:   05/22/2021    Scheduling Instructions:     In Dec 2021    Order Specific Question:   Where should this test be performed?    Answer:   [facility]    Order Specific Question:   Basic spirometry    Answer:   Yes    Order Specific Question:   Spirometry pre & post bronchodilator    Answer:   Yes    Keep all medications the same except you can stop Spiriva. If your symptoms worsen again, please restart this once daily.    Return in about 3 months (around 08/21/2020). with Dr. Shearon Stalls (30 min visit). Follow up visit after repeat spirometry.   Please do your part to reduce the spread of COVID-19.

## 2020-05-22 NOTE — Progress Notes (Signed)
Synopsis: Referred in June 2021 for asthma by Lauree Chandler, NP.  Subjective:   [name], MRN: [id number]  Chief Complaint  Patient presents with  . Follow-up    Denies any problems. Pt. is doing well.    Maria Adkins is a 70 year old woman with a history of severe persistent asthma and HIV who presents for follow-up.  Since her last visit her symptoms have been significantly improved.  She was increased to high-dose Advair, started Spiriva, Singulair, and Dupixent.  First Dupixent injection on 05/17/2020.  She has not needed her rescue inhaler since her last visit.  No ED visits, prednisone, antibiotics.  She no longer has nighttime symptoms or dyspnea on exertion.  She is off prednisone without recurrence of symptoms.  Overall she is feeling well.    OV 03/14/20: Maria Adkins is a 69 year old woman with a history of HIV who presents for evaluation of severe persistent asthma.  She has had multiple ED visits recently due to asthma exacerbations.  Her main symptoms are shortness of breath, wheezing, chest tightness, nocturnal symptoms.  She has had a dry cough as well.  She was diagnosed with asthma around age 89, and it was always well controlled when she was in [place], although at times this was on daily prednisone as high as 60 mg/day.  Since moving to [place] in 2017 she has developed worse sinus allergy symptoms and had progressively worsening asthma control.  She has postnasal drip with frequent throat clearing.  Her current maintenance medications are Singulair daily, Symbicort 2 puffs twice daily, albuterol nebulizers and rescue inhaler.  She gets significant side effects from using her albuterol inhaler.  She takes Flonase as often as she can, but sometimes gets nosebleeds from it.  She is not currently on antihistamine, but previously was taking Zyrtec.  She is currently on prednisone 20 mg daily since her last ED visit on 6/14.   Since being on prednisone she has used her albuterol very infrequently.  She has three more days of prednisone ordered.  Her breathing is worse with hot and humid weather and around strong smells like perfumes and smoke.  Several family members have asthma, including her sister, multiple nieces and nephews, and her cousin.  She has had seasonal allergies her entire life.  She is a former smoker who quit 40 years ago after 1 pack/day x 14 years.  She is very compliant with her medications.      Past Medical History:  Diagnosis Date  . Asthma   . High blood pressure   . HIV (human immunodeficiency virus infection) ([place])   . Osteoarthritis      Family History  Problem Relation Age of Onset  . Diabetes Mother        died at age 65  . Hypertension Mother   . Asthma Sister   . Diabetes Sister   . Hypertension Sister   . Arthritis Sister   . Diabetes Sister   . Hypertension Sister   . Asthma Sister   . Arthritis Sister   . Asthma Son        controlled     Past Surgical History:  Procedure Laterality Date  . Biopsy of Liver    . CATARACT EXTRACTION, BILATERAL    . CHOLECYSTECTOMY    . CYST REMOVAL HAND Left   . LAPAROSCOPIC SALPINGOOPHERECTOMY Right     Social History   Socioeconomic History  . Marital status:  Married    Spouse name: Not on file  . Number of children: Not on file  . Years of education: Not on file  . Highest education level: Not on file  Occupational History  . Not on file  Tobacco Use  . Smoking status: Former Smoker    Packs/day: 1.00    Years: 14.00    Pack years: 14.00    Types: Cigarettes    Quit date: 09/14/1978    Years since quitting: 41.7  . Smokeless tobacco: Never Used  Vaping Use  . Vaping Use: Never used  Substance and Sexual Activity  . Alcohol use: No  . Drug use: No  . Sexual activity: Yes    Partners: Male    Birth control/protection: Condom    Comment: declined condoms  Other Topics Concern  . Not on file  Social History  Narrative   Diet? Regular      Do you drink/eat things with caffeine? Coffee      Marital status?  Married                                  What year were you married? March 16, 2009      Do you live in a house, apartment, assisted living, condo, trailer, etc.? House      Is it one or more stories? 1 story      How many persons live in your home? 2      Do you have any pets in your home? (please list) yes-dog      Current or past profession: Administrator      Do you exercise?  no                                    Type & how often? n/a      Do you have a living will? no      Do you have a DNR form?     no                             If not, do you want to discuss one? Yes      Do you have signed POA/HPOA for forms? no      Social Determinants of Health   Financial Resource Strain:   . Difficulty of Paying Living Expenses: Not on file  Food Insecurity:   . Worried About Charity fundraiser in the Last Year: Not on file  . Ran Out of Food in the Last Year: Not on file  Transportation Needs:   . Lack of Transportation (Medical): Not on file  . Lack of Transportation (Non-Medical): Not on file  Physical Activity:   . Days of Exercise per Week: Not on file  . Minutes of Exercise per Session: Not on file  Stress:   . Feeling of Stress : Not on file  Social Connections:   . Frequency of Communication with Friends and Family: Not on file  . Frequency of Social Gatherings with Friends and Family: Not on file  . Attends Religious Services: Not on file  . Active Member of Clubs or Organizations: Not on file  . Attends Archivist Meetings: Not on file  . Marital Status: Not on file  Intimate Partner Violence:   .  Fear of Current or Ex-Partner: Not on file  . Emotionally Abused: Not on file  . Physically Abused: Not on file  . Sexually Abused: Not on file     No Known Allergies   Immunization History  Administered Date(s) Administered  . Fluad Quad(high Dose  65+) 05/16/2019  . Influenza,inj,Quad PF,6+ Mos 06/17/2017, 06/09/2018  . Influenza-Unspecified 06/11/2016  . PFIZER SARS-COV-2 Vaccination 10/20/2019, 11/14/2019  . Pneumococcal Conjugate-13 05/20/2018  . Pneumococcal Polysaccharide-23 02/15/2017  . Tdap 09/21/2017  . Zoster Recombinat (Shingrix) 07/19/2018    Outpatient Medications Prior to Visit  Medication Sig Dispense Refill  . albuterol (PROVENTIL) (2.5 MG/3ML) 0.083% nebulizer solution Take 3 mLs (2.5 mg total) by nebulization every 4 (four) hours as needed for wheezing or shortness of breath. 150 mL 6  . albuterol (VENTOLIN HFA) 108 (90 Base) MCG/ACT inhaler INHALE 2 PUFFS BY MOUTH INTO THE LUNGS EVERY 4 HOURS AS NEEDED 18 g 5  . bictegravir-emtricitabine-tenofovir AF (BIKTARVY) 50-200-25 MG TABS tablet Take 1 tablet by mouth daily. 30 tablet 5  . Blood Pressure Monitoring (BLOOD PRESSURE CUFF) MISC 1 Device by Does not apply route daily. DX: I10 1 each 0  . Calcium Carbonate (CALCIUM 600 PO) Take 1 tablet by mouth daily.     . cetirizine (ZYRTEC) 10 MG tablet Take 1 tablet (10 mg total) by mouth daily. 30 tablet 11  . enalapril-hydrochlorothiazide (VASERETIC) 10-25 MG tablet TAKE 1 TABLET BY MOUTH EVERY DAY 90 tablet 1  . fluticasone (FLONASE) 50 MCG/ACT nasal spray Place 2 sprays into both nostrils daily. 16 g 6  . Fluticasone-Salmeterol (ADVAIR DISKUS) 500-50 MCG/DOSE AEPB Inhale 1 puff into the lungs 2 (two) times daily. 60 each 11  . metoprolol tartrate (LOPRESSOR) 100 MG tablet TAKE 1 TABLET BY MOUTH EVERY DAY 90 tablet 1  . montelukast (SINGULAIR) 10 MG tablet Take 1 tablet (10 mg total) by mouth at bedtime. 30 tablet 11  . Multiple Vitamins-Minerals (MULTIVITAMIN GUMMIES ADULT PO) Take 2 tablets by mouth daily. Gummy    . Omega-3 Fatty Acids (FISH OIL) 1000 MG CAPS Take by mouth.    Vladimir Faster Glycol-Propyl Glycol (SYSTANE HYDRATION PF OP) Place 1 drop into both eyes daily as needed (For dry eyes).     . pravastatin  (PRAVACHOL) 20 MG tablet Take 1 tablet (20 mg total) by mouth daily. 90 tablet 1  . Saccharomyces boulardii (PROBIOTIC) 250 MG CAPS By mouth daily 60 capsule   . Tiotropium Bromide Monohydrate (SPIRIVA RESPIMAT) 1.25 MCG/ACT AERS Inhale 2 puffs into the lungs daily. 4 g 11  . VITAMIN D PO Take 1 capsule by mouth daily.    . budesonide-formoterol (SYMBICORT) 160-4.5 MCG/ACT inhaler Inhale 2 puffs into the lungs in the morning and at bedtime. 30.6 Inhaler 1  . predniSONE (DELTASONE) 10 MG tablet Take 2 tablets (20 mg total) by mouth daily with breakfast. 40 tablet 0  . predniSONE (DELTASONE) 5 MG tablet Take 1 tablet (5 mg total) by mouth daily with breakfast. 40 tablet 1   No facility-administered medications prior to visit.    Review of Systems  Constitutional: Negative for chills and fever.  HENT: Positive for congestion and nosebleeds.   Respiratory: Positive for cough, shortness of breath and wheezing. Negative for hemoptysis.   Cardiovascular: Negative for chest pain and leg swelling.  Gastrointestinal: Negative for heartburn, nausea and vomiting.  Skin: Negative for rash.  Endo/Heme/Allergies: Positive for environmental allergies.     Objective:   Vitals:   05/22/20 1340  BP: 132/78  Pulse: 72  Temp: 98.2 F (36.8 C)  TempSrc: Oral  SpO2: 98%  Weight: 178 lb 9.6 oz (81 kg)  Height: 5\' 9"  (1.753 m)   98% on  RA BMI Readings from Last 3 Encounters:  05/22/20 26.37 kg/m  03/14/20 26.05 kg/m  02/26/20 26.30 kg/m   Wt Readings from Last 3 Encounters:  05/22/20 178 lb 9.6 oz (81 kg)  03/14/20 176 lb 6.4 oz (80 kg)  02/26/20 173 lb (78.5 kg)    Physical Exam Vitals reviewed.  Constitutional:      General: She is not in acute distress.    Appearance: Normal appearance. She is not ill-appearing.  HENT:     Head: Normocephalic and atraumatic.  Eyes:     General: No scleral icterus. Cardiovascular:     Rate and Rhythm: Normal rate and regular rhythm.     Heart  sounds: No murmur heard.   Pulmonary:     Comments: Breathing comfortably on room air, no conversational dyspnea.  Clear to auscultation bilaterally.  No observed coughing. Abdominal:     General: There is no distension.     Palpations: Abdomen is soft.     Tenderness: There is no abdominal tenderness.  Musculoskeletal:        General: No swelling or deformity.     Cervical back: Neck supple.  Lymphadenopathy:     Cervical: No cervical adenopathy.  Skin:    General: Skin is warm and dry.     Findings: No rash.  Neurological:     General: No focal deficit present.     Mental Status: She is alert.     Motor: No weakness.     Coordination: Coordination normal.  Psychiatric:        Mood and Affect: Mood normal.        Behavior: Behavior normal.      CBC    Component Value Date/Time   WBC 10.1 02/03/2020 0511   RBC 4.81 02/03/2020 0511   HGB 15.2 (H) 02/03/2020 0511   HCT 46.0 02/03/2020 0511   PLT 295 02/03/2020 0511   MCV 95.6 02/03/2020 0511   MCH 31.6 02/03/2020 0511   MCHC 33.0 02/03/2020 0511   RDW 13.3 02/03/2020 0511   LYMPHSABS 1.7 02/03/2020 0511   MONOABS 0.2 02/03/2020 0511   EOSABS 0.0 02/03/2020 0511   BASOSABS 0.1 02/03/2020 0511   Eos 500 on 12/27/2019 eos 294 on 01/08/2020  CHEMISTRY No results for input(s): NA, K, CL, CO2, GLUCOSE, BUN, CREATININE, CALCIUM, MG, PHOS in the last 168 hours. CrCl cannot be calculated (Patient's most recent lab result is older than the maximum 21 days allowed.).   Chest Imaging- films reviewed: CXR, two view 02/26/2020-increased lung markings bilaterally, mildly increased retrosternal airspace.  Pulmonary Functions Testing Results: PFT Results Latest Ref Rng & Units 04/22/2020  FVC-Pre L 2.48  FVC-Predicted Pre % 88  FVC-Post L 2.55  FVC-Predicted Post % 91  Pre FEV1/FVC % % 61  Post FEV1/FCV % % 71  FEV1-Pre L 1.52  FEV1-Predicted Pre % 69  FEV1-Post L 1.81  DLCO uncorrected ml/min/mmHg 19.68  DLCO UNC% % 88   DLCO corrected ml/min/mmHg 19.68  DLCO COR %Predicted % 88  DLVA Predicted % 106  TLC L 4.78  TLC % Predicted % 84  RV % Predicted % 93   2021- reversible obstruction, significant BD reversibility, no restriction, normal DLCO.      Assessment & Plan:     ICD-10-CM  1. Severe persistent allergic asthma  J45.50 Spirometry with graph  2. Allergic rhinitis, unspecified seasonality, unspecified trigger  J30.9     Severe persistent allergic asthma-- controlled -Continue high-dose Advair two times daily.  Rinse her mouth after every use. -Okay to discontinue Spiriva once daily.  If her symptoms worsen, she should resume this medication once daily. -Continue Dupixent injections every 2 weeks.   -Continue Singulair daily. -Continue albuterol every 4 hours as needed --Continue allergic rhinosinusitis management--Zyrtec, Flonase   Allergic rhinosinusitis -Continue continue daily Flonase, cetirizine, and Singulair.  RTC in 3 months with Dr. Shearon Stalls.    Current Outpatient Medications:  .  albuterol (PROVENTIL) (2.5 MG/3ML) 0.083% nebulizer solution, Take 3 mLs (2.5 mg total) by nebulization every 4 (four) hours as needed for wheezing or shortness of breath., Disp: 150 mL, Rfl: 6 .  albuterol (VENTOLIN HFA) 108 (90 Base) MCG/ACT inhaler, INHALE 2 PUFFS BY MOUTH INTO THE LUNGS EVERY 4 HOURS AS NEEDED, Disp: 18 g, Rfl: 5 .  bictegravir-emtricitabine-tenofovir AF (BIKTARVY) 50-200-25 MG TABS tablet, Take 1 tablet by mouth daily., Disp: 30 tablet, Rfl: 5 .  Blood Pressure Monitoring (BLOOD PRESSURE CUFF) MISC, 1 Device by Does not apply route daily. DX: I10, Disp: 1 each, Rfl: 0 .  Calcium Carbonate (CALCIUM 600 PO), Take 1 tablet by mouth daily. , Disp: , Rfl:  .  cetirizine (ZYRTEC) 10 MG tablet, Take 1 tablet (10 mg total) by mouth daily., Disp: 30 tablet, Rfl: 11 .  enalapril-hydrochlorothiazide (VASERETIC) 10-25 MG tablet, TAKE 1 TABLET BY MOUTH EVERY DAY, Disp: 90 tablet, Rfl: 1 .   fluticasone (FLONASE) 50 MCG/ACT nasal spray, Place 2 sprays into both nostrils daily., Disp: 16 g, Rfl: 6 .  Fluticasone-Salmeterol (ADVAIR DISKUS) 500-50 MCG/DOSE AEPB, Inhale 1 puff into the lungs 2 (two) times daily., Disp: 60 each, Rfl: 11 .  metoprolol tartrate (LOPRESSOR) 100 MG tablet, TAKE 1 TABLET BY MOUTH EVERY DAY, Disp: 90 tablet, Rfl: 1 .  montelukast (SINGULAIR) 10 MG tablet, Take 1 tablet (10 mg total) by mouth at bedtime., Disp: 30 tablet, Rfl: 11 .  Multiple Vitamins-Minerals (MULTIVITAMIN GUMMIES ADULT PO), Take 2 tablets by mouth daily. Gummy, Disp: , Rfl:  .  Omega-3 Fatty Acids (FISH OIL) 1000 MG CAPS, Take by mouth., Disp: , Rfl:  .  Polyethyl Glycol-Propyl Glycol (SYSTANE HYDRATION PF OP), Place 1 drop into both eyes daily as needed (For dry eyes). , Disp: , Rfl:  .  pravastatin (PRAVACHOL) 20 MG tablet, Take 1 tablet (20 mg total) by mouth daily., Disp: 90 tablet, Rfl: 1 .  Saccharomyces boulardii (PROBIOTIC) 250 MG CAPS, By mouth daily, Disp: 60 capsule, Rfl:  .  Tiotropium Bromide Monohydrate (SPIRIVA RESPIMAT) 1.25 MCG/ACT AERS, Inhale 2 puffs into the lungs daily., Disp: 4 g, Rfl: 11 .  VITAMIN D PO, Take 1 capsule by mouth daily., Disp: , Rfl:  .  budesonide-formoterol (SYMBICORT) 160-4.5 MCG/ACT inhaler, Inhale 2 puffs into the lungs in the morning and at bedtime., Disp: 30.6 Inhaler, Rfl: 1 .  predniSONE (DELTASONE) 10 MG tablet, Take 2 tablets (20 mg total) by mouth daily with breakfast., Disp: 40 tablet, Rfl: 0 .  predniSONE (DELTASONE) 5 MG tablet, Take 1 tablet (5 mg total) by mouth daily with breakfast., Disp: 40 tablet, Rfl: 1     Julian Hy, DO [facility] 05/22/2020 1:58 PM

## 2020-05-27 ENCOUNTER — Ambulatory Visit: Payer: 59 | Admitting: Nurse Practitioner

## 2020-05-31 ENCOUNTER — Other Ambulatory Visit: Payer: Self-pay

## 2020-05-31 ENCOUNTER — Encounter: Payer: Self-pay | Admitting: Nurse Practitioner

## 2020-05-31 ENCOUNTER — Ambulatory Visit (INDEPENDENT_AMBULATORY_CARE_PROVIDER_SITE_OTHER): Payer: 59 | Admitting: Nurse Practitioner

## 2020-05-31 VITALS — BP 124/80 | HR 63 | Temp 97.1°F | Ht 69.0 in | Wt 175.0 lb

## 2020-05-31 DIAGNOSIS — J4550 Severe persistent asthma, uncomplicated: Secondary | ICD-10-CM

## 2020-05-31 DIAGNOSIS — B20 Human immunodeficiency virus [HIV] disease: Secondary | ICD-10-CM

## 2020-05-31 DIAGNOSIS — E2839 Other primary ovarian failure: Secondary | ICD-10-CM

## 2020-05-31 DIAGNOSIS — M25562 Pain in left knee: Secondary | ICD-10-CM | POA: Diagnosis not present

## 2020-05-31 DIAGNOSIS — M8580 Other specified disorders of bone density and structure, unspecified site: Secondary | ICD-10-CM | POA: Diagnosis not present

## 2020-05-31 DIAGNOSIS — Z23 Encounter for immunization: Secondary | ICD-10-CM

## 2020-05-31 DIAGNOSIS — M25512 Pain in left shoulder: Secondary | ICD-10-CM | POA: Diagnosis not present

## 2020-05-31 DIAGNOSIS — M25511 Pain in right shoulder: Secondary | ICD-10-CM | POA: Diagnosis not present

## 2020-05-31 DIAGNOSIS — I10 Essential (primary) hypertension: Secondary | ICD-10-CM

## 2020-05-31 NOTE — Patient Instructions (Addendum)
You are OVERDUE for your bone density- please call [phone] to set up appt.   Aleve (naproxen) 1 tablet twice daily for 7 days- take with food To use tylenol 500 mg 1-2 tablets every 8 hours as needed for breakthrough pain  Ice shoulder shoulder 20-30 mins 3 times daily  Apply muscle rub AFTER ice

## 2020-05-31 NOTE — Progress Notes (Signed)
Careteam: Patient Care Team: Lauree Chandler, NP as PCP - General (Geriatric Medicine)  PLACE OF SERVICE:  [place] Directive information Does Patient Have a Medical Advance Directive?: No, Would patient like information on creating a medical advance directive?: Yes (MAU/Ambulatory/Procedural Areas - Information given)  No Known Allergies  Chief Complaint  Patient presents with  . Medical Management of Chronic Issues    4 month follow-up   . Shoulder Pain    Bilateral shoulder pain   . Knee Problem    Bilateral knee pain, left knee is worse   . Immunizations    Flu vaccine today      HPI: Patient is a 69 y.o. female for routine follow up  Pt followed by pulmonary due to persistent asthma. She was on high dose advair, spiriva, Singulair and dupixent injection. Since doing so well the spiriva was stopped and she is still doing well.   HIV- following with ID next month, plans to do labs with them.   htn- controlled.   Reports left shoulder pain for about a month and now right side is hurting. Left side is the worse, can not elevate arm without pain at times.  Depending on how she is sitting or laying the pain exacerbates. Takes ibuprofen at night.  Plans to get a muscle rub.  Pain at night is 10+  Reports Left knee also hurting- can hear/feel. knee cracking  Doing exercise, line dance, chair yoga, tai chi   Review of Systems:  Review of Systems  Constitutional: Negative for chills, fever and weight loss.  HENT: Negative for hearing loss.   Respiratory: Negative for cough, sputum production and shortness of breath.   Cardiovascular: Negative for chest pain, palpitations and leg swelling.  Gastrointestinal: Negative for abdominal pain, constipation, diarrhea and heartburn.  Genitourinary: Negative for dysuria, frequency and urgency.  Musculoskeletal: Positive for joint pain and myalgias. Negative for back pain and falls.  Skin: Negative.     Neurological: Negative for dizziness and headaches.  Psychiatric/Behavioral: Negative for depression and memory loss. The patient does not have insomnia.     Past Medical History:  Diagnosis Date  . Asthma   . High blood pressure   . HIV (human immunodeficiency virus infection) ([place])   . Osteoarthritis    Past Surgical History:  Procedure Laterality Date  . Biopsy of Liver    . CATARACT EXTRACTION, BILATERAL    . CHOLECYSTECTOMY    . CYST REMOVAL HAND Left   . LAPAROSCOPIC SALPINGOOPHERECTOMY Right    Social History:   reports that she quit smoking about 41 years ago. Her smoking use included cigarettes. She has a 14.00 pack-year smoking history. She has never used smokeless tobacco. She reports that she does not drink alcohol and does not use drugs.  Family History  Problem Relation Age of Onset  . Diabetes Mother        died at age 40  . Hypertension Mother   . Asthma Sister   . Diabetes Sister   . Hypertension Sister   . Arthritis Sister   . Diabetes Sister   . Hypertension Sister   . Asthma Sister   . Arthritis Sister   . Asthma Son        controlled    Medications: Patient's Medications  New Prescriptions   No medications on file  Previous Medications   ALBUTEROL (PROVENTIL) (2.5 MG/3ML) 0.083% NEBULIZER SOLUTION    Take 3 mLs (2.5 mg total) by  nebulization every 4 (four) hours as needed for wheezing or shortness of breath.   ALBUTEROL (VENTOLIN HFA) 108 (90 BASE) MCG/ACT INHALER    INHALE 2 PUFFS BY MOUTH INTO THE LUNGS EVERY 4 HOURS AS NEEDED   BICTEGRAVIR-EMTRICITABINE-TENOFOVIR AF (BIKTARVY) 50-200-25 MG TABS TABLET    Take 1 tablet by mouth daily.   BLOOD PRESSURE MONITORING (BLOOD PRESSURE CUFF) MISC    1 Device by Does not apply route daily. DX: I10   CALCIUM CARBONATE (CALCIUM 600 PO)    Take 1 tablet by mouth daily.    CETIRIZINE (ZYRTEC) 10 MG TABLET    Take 1 tablet (10 mg total) by mouth daily.   ENALAPRIL-HYDROCHLOROTHIAZIDE (VASERETIC) 10-25 MG  TABLET    TAKE 1 TABLET BY MOUTH EVERY DAY   FLUTICASONE (FLONASE) 50 MCG/ACT NASAL SPRAY    Place 2 sprays into both nostrils daily.   FLUTICASONE-SALMETEROL (ADVAIR DISKUS) 500-50 MCG/DOSE AEPB    Inhale 1 puff into the lungs 2 (two) times daily.   IBUPROFEN (ADVIL) 200 MG TABLET    Take 400 mg by mouth at bedtime. For shoulder and knee pain   METOPROLOL TARTRATE (LOPRESSOR) 100 MG TABLET    TAKE 1 TABLET BY MOUTH EVERY DAY   MONTELUKAST (SINGULAIR) 10 MG TABLET    Take 1 tablet (10 mg total) by mouth at bedtime.   MULTIPLE VITAMINS-MINERALS (MULTIVITAMIN GUMMIES ADULT PO)    Take 2 tablets by mouth daily. Gummy   OMEGA-3 FATTY ACIDS (FISH OIL) 1000 MG CAPS    Take by mouth.   PRAVASTATIN (PRAVACHOL) 20 MG TABLET    Take 1 tablet (20 mg total) by mouth daily.   SACCHAROMYCES BOULARDII (PROBIOTIC) 250 MG CAPS    By mouth daily   VITAMIN D PO    Take 1 capsule by mouth daily.  Modified Medications   No medications on file  Discontinued Medications   BUDESONIDE-FORMOTEROL (SYMBICORT) 160-4.5 MCG/ACT INHALER    Inhale 2 puffs into the lungs in the morning and at bedtime.   POLYETHYL GLYCOL-PROPYL GLYCOL (SYSTANE HYDRATION PF OP)    Place 1 drop into both eyes daily as needed (For dry eyes).    PREDNISONE (DELTASONE) 10 MG TABLET    Take 2 tablets (20 mg total) by mouth daily with breakfast.   PREDNISONE (DELTASONE) 5 MG TABLET    Take 1 tablet (5 mg total) by mouth daily with breakfast.   TIOTROPIUM BROMIDE MONOHYDRATE (SPIRIVA RESPIMAT) 1.25 MCG/ACT AERS    Inhale 2 puffs into the lungs daily.    Physical Exam:  Vitals:   05/31/20 0848  BP: 124/80  Pulse: 63  Temp: (!) 97.1 F (36.2 C)  TempSrc: Temporal  SpO2: 97%  Weight: 175 lb (79.4 kg)  Height: _0  (1.753 m)   Body mass index is 25.84 kg/m. Wt Readings from Last 3 Encounters:  05/31/20 175 lb (79.4 kg)  05/22/20 178 lb 9.6 oz (81 kg)  03/14/20 176 lb 6.4 oz (80 kg)    Physical Exam Constitutional:      General: She  is not in acute distress.    Appearance: She is well-developed. She is not diaphoretic.  HENT:     Head: Normocephalic and atraumatic.     Mouth/Throat:     Pharynx: No oropharyngeal exudate.  Eyes:     Conjunctiva/sclera: Conjunctivae normal.     Pupils: Pupils are equal, round, and reactive to light.  Cardiovascular:     Rate and Rhythm: Normal rate and regular  rhythm.     Heart sounds: Normal heart sounds.  Pulmonary:     Effort: Pulmonary effort is normal.     Breath sounds: Normal breath sounds.  Abdominal:     General: Bowel sounds are normal.     Palpations: Abdomen is soft.  Musculoskeletal:     Left shoulder: Tenderness present.     Cervical back: Normal range of motion and neck supple.     Right knee: Crepitus present. No swelling or effusion.     Left knee: Crepitus present. No swelling or effusion.     Right lower leg: No edema.     Left lower leg: No edema.  Skin:    General: Skin is warm and dry.  Neurological:     Mental Status: She is alert and oriented to person, place, and time.  Psychiatric:        Mood and Affect: Mood normal.        Behavior: Behavior normal.     Labs reviewed: Basic Metabolic Panel: Recent Labs    02/02/20 2204 02/03/20 0511 02/04/20 0720  NA 140 141 138  K 3.7 2.9* 4.6  CL 101 101 102  CO2 _0 GLUCOSE 105* 195* 148*  BUN _1 CREATININE 0.99 1.21* 0.91  CALCIUM 9.5 9.4 9.5  MG  --  2.3 2.4   Liver Function Tests: Recent Labs    01/02/20 1049 01/08/20 0842 02/03/20 0511  AST _2 ALT _3 ALKPHOS  --   --  46  BILITOT 0.8 1.0 0.6  PROT 6.4 6.2 7.5  ALBUMIN  --   --  4.2   No results for input(s): LIPASE, AMYLASE in the last 8760 hours. No results for input(s): AMMONIA in the last 8760 hours. CBC: Recent Labs    01/02/20 1049 01/02/20 1049 01/08/20 0842 02/02/20 2204 02/03/20 0511  WBC 13.2*   < > 6.4 8.8 10.1  NEUTROABS 8,884*  --  2,317  --  8.0*  HGB 13.9   < > 13.5 14.8 15.2*    HCT 41.7   < > 41.1 43.2 46.0  MCV 91.9   < > 92.2 92.1 95.6  PLT 301   < > 251 274 295   < > = values in this interval not displayed.   Lipid Panel: Recent Labs    01/02/20 1049 01/08/20 0842  CHOL 155 141  HDL 44* 44*  LDLCALC 83 78  TRIG 186* 102  CHOLHDL 3.5 3.2   TSH: No results for input(s): TSH in the last 8760 hours. A1C: No results found for: HGBA1C   Assessment/Plan 1. Need for influenza vaccination - Flu Vaccine QUAD High Dose(Fluad)  2. Acute pain of both shoulders Progressive worsening of pain to left shoulder and now with worsening pain to right shoulder -encouraged ice TID -can use muscle rub after ice -to stop ibuprofen and start aleve 1 tablet BID (with food) for 1 week  - Ambulatory referral to Physical Therapy - Ambulatory referral to Sports Medicine  3. Acute pain of left knee -progressive pain noted to left knee. Encouraged to continue with exercise but not to work through pain. Will get PT referral to help with this.  - Ambulatory referral to Physical Therapy - Ambulatory referral to Sports Medicine  4. Severe persistent asthma, unspecified whether complicated Improved on current regimen  5. Essential hypertension Stable on enalapril-hctz with dietary modifications  6. Human immunodeficiency virus (HIV) disease ([place])  Continues on biktarvy and follow up with ID  7. Osteopenia, unspecified location Continue cal and vit d with weight bearing exercises.  - DG Bone Density; Future  8. Estrogen deficiency - DG Bone Density; Future  Next appt: 4 months Apollos Tenbrink K. [place], [facility] [phone]

## 2020-06-03 ENCOUNTER — Other Ambulatory Visit: Payer: Self-pay

## 2020-06-03 ENCOUNTER — Ambulatory Visit (INDEPENDENT_AMBULATORY_CARE_PROVIDER_SITE_OTHER): Payer: 59

## 2020-06-03 DIAGNOSIS — J4550 Severe persistent asthma, uncomplicated: Secondary | ICD-10-CM

## 2020-06-03 MED ORDER — DUPILUMAB 300 MG/2ML ~~LOC~~ SOSY
300.0000 mg | PREFILLED_SYRINGE | Freq: Once | SUBCUTANEOUS | Status: AC
  Administered 2020-06-03: 300 mg via SUBCUTANEOUS

## 2020-06-03 MED FILL — BIKTARVY 50-200-25 MG TABS: 50-200-25 | 30 days supply | Qty: 30 | Fill #5

## 2020-06-03 NOTE — Progress Notes (Signed)
Have you been hospitalized within the last 10 days?  No Do you have a fever?  No Do you have a cough?  No Do you have a headache or sore throat? No Do you have your Epi Pen visible and is it within date?  Yes 

## 2020-06-07 MED FILL — ADVAIR 500/50 DISKUS: 500-50 | 30 days supply | Qty: 60 | Fill #2

## 2020-06-10 ENCOUNTER — Telehealth: Payer: Self-pay | Admitting: *Deleted

## 2020-06-10 NOTE — Telephone Encounter (Signed)
Patient received a letter in the mail saying "the request for biktarvy to be backdated to 09/16/2018 has been denied because it is more than 71 days old. please contact your doctor for more info." Patient has had no issue with Biktarvy refills, just got a new refill last week.  No rejection from the pharmacy in triage.  RN spoke with Betty/Donna in pharmacy - they do not see any current or outstanding billing issues.  They advised the patient that it should be ok, and to contact us if there is any further issue. Landis Gandy, RN

## 2020-06-17 ENCOUNTER — Other Ambulatory Visit: Payer: Self-pay

## 2020-06-17 ENCOUNTER — Ambulatory Visit (INDEPENDENT_AMBULATORY_CARE_PROVIDER_SITE_OTHER): Payer: 59

## 2020-06-17 DIAGNOSIS — J4550 Severe persistent asthma, uncomplicated: Secondary | ICD-10-CM | POA: Diagnosis not present

## 2020-06-17 MED ORDER — DUPILUMAB 300 MG/2ML ~~LOC~~ SOSY
300.0000 mg | PREFILLED_SYRINGE | Freq: Once | SUBCUTANEOUS | Status: AC
  Administered 2020-06-17: 300 mg via SUBCUTANEOUS

## 2020-06-17 NOTE — Progress Notes (Signed)
Have you been hospitalized within the last 10 days?  No Do you have a fever?  No Do you have a cough?  No Do you have a headache or sore throat? No Do you have your Epi Pen visible and is it within date?  Yes 

## 2020-06-20 MED FILL — MONTELUKAST SOD 10 MG TAB: 10 | 30 days supply | Qty: 30 | Fill #3

## 2020-06-20 MED FILL — PRAVASTATIN NA 20 MG TAB: 20 | 90 days supply | Qty: 90 | Fill #1

## 2020-06-24 ENCOUNTER — Telehealth: Payer: Self-pay | Admitting: Critical Care Medicine

## 2020-06-24 NOTE — Telephone Encounter (Signed)
Dupixent Order: 300mg  #2 prefilled syringe Ordered Date: 06/24/20 Expected date of arrival: 06/27/20- delivery to Patient  Ordered by: Earvin Blazier,LPN [facility]ty [facility]: [facility]

## 2020-06-26 ENCOUNTER — Other Ambulatory Visit: Payer: Self-pay | Admitting: Pharmacist

## 2020-06-26 DIAGNOSIS — B20 Human immunodeficiency virus [HIV] disease: Secondary | ICD-10-CM

## 2020-07-02 ENCOUNTER — Ambulatory Visit (INDEPENDENT_AMBULATORY_CARE_PROVIDER_SITE_OTHER): Payer: 59 | Admitting: Family

## 2020-07-02 ENCOUNTER — Other Ambulatory Visit: Payer: Self-pay

## 2020-07-02 ENCOUNTER — Encounter: Payer: Self-pay | Admitting: Family

## 2020-07-02 VITALS — BP 122/86 | HR 68 | Temp 97.5°F | Ht 69.0 in | Wt 178.0 lb

## 2020-07-02 DIAGNOSIS — H6122 Impacted cerumen, left ear: Secondary | ICD-10-CM | POA: Diagnosis not present

## 2020-07-02 MED FILL — BIKTARVY 50-200-25 MG TABS: 50-200-25 | 30 days supply | Qty: 30 | Fill #0

## 2020-07-02 NOTE — Progress Notes (Signed)
Provider: Damya Comley FNP-C  Lauree Chandler, NP  Patient Care Team: Lauree Chandler, NP as PCP - General (Geriatric Medicine)  Extended Emergency Contact Information Primary Emergency Contact: [facility] Address: [address]          [place], [place] [address] Johnnette Litter of [place] Phone: [phone] Relation: Spouse Secondary Emergency Contact: Amstutz,Rushdan Address: [place], [place] [address] Johnnette Litter of [place] Phone: [phone] Mobile Phone: [phone] Relation: Son  Code Status:  Full Code  Goals of care: Advanced Directive information Advanced Directives 07/02/2020  Does Patient Have a Medical Advance Directive? No  Type of Advance Directive -  Does patient want to make changes to medical advance directive? -  Copy of [place] in Chart? -  Would patient like information on creating a medical advance directive? Yes (MAU/Ambulatory/Procedural Areas - Information given)     Chief Complaint  Patient presents with  . Acute Visit    Wax removal     HPI:  Pt is a 69 y.o. female seen today for an acute visit for cerumen removal.she states has been using Debrox ear drops for the past 4 days now here for ear lavage.she denies any fever,chills,ringing in the ear or drainage.she states has had to ask family members often what they are saying.due to hearing on the left ear " feels like something hard in it".    Past Medical History:  Diagnosis Date  . Asthma   . High blood pressure   . HIV (human immunodeficiency virus infection) ([place])   . Osteoarthritis    Past Surgical History:  Procedure Laterality Date  . Biopsy of Liver    . CATARACT EXTRACTION, BILATERAL    . CHOLECYSTECTOMY    . CYST REMOVAL HAND Left   . LAPAROSCOPIC SALPINGOOPHERECTOMY Right     No Known Allergies  Outpatient Encounter Medications as of 07/02/2020  Medication Sig  . albuterol (PROVENTIL) (2.5 MG/3ML) 0.083%  nebulizer solution Take 3 mLs (2.5 mg total) by nebulization every 4 (four) hours as needed for wheezing or shortness of breath.  [name] albuterol (VENTOLIN HFA) 108 (90 Base) MCG/ACT inhaler INHALE 2 PUFFS BY MOUTH INTO THE LUNGS EVERY 4 HOURS AS NEEDED  . BIKTARVY 50-200-25 MG TABS tablet TAKE 1 TABLET BY MOUTH DAILY.  [name] Blood Pressure Monitoring (BLOOD PRESSURE CUFF) MISC 1 Device by Does not apply route daily. DX: I10  . Calcium Carbonate (CALCIUM 600 PO) Take 1 tablet by mouth daily.   . cetirizine (ZYRTEC) 10 MG tablet Take 1 tablet (10 mg total) by mouth daily.  . Dupilumab (DUPIXENT [place]) Inject 1 Dose into the skin every 14 (fourteen) days.  . enalapril-hydrochlorothiazide (VASERETIC) 10-25 MG tablet TAKE 1 TABLET BY MOUTH EVERY DAY  . fluticasone (FLONASE) 50 MCG/ACT nasal spray Place 2 sprays into both nostrils daily.  . Fluticasone-Salmeterol (ADVAIR DISKUS) 500-50 MCG/DOSE AEPB Inhale 1 puff into the lungs 2 (two) times daily.  [name] ibuprofen (ADVIL) 200 MG tablet Take 400 mg by mouth at bedtime. For shoulder and knee pain  . metoprolol tartrate (LOPRESSOR) 100 MG tablet TAKE 1 TABLET BY MOUTH EVERY DAY  . montelukast (SINGULAIR) 10 MG tablet Take 1 tablet (10 mg total) by mouth at bedtime.  . Multiple Vitamins-Minerals (MULTIVITAMIN GUMMIES ADULT PO) Take 2 tablets by mouth daily. Gummy  . Omega-3 Fatty Acids (FISH OIL) 1000 MG CAPS Take by mouth.  . pravastatin (PRAVACHOL) 20 MG  tablet Take 1 tablet (20 mg total) by mouth daily.  . Saccharomyces boulardii (PROBIOTIC) 250 MG CAPS By mouth daily  . vitamin B-12 (CYANOCOBALAMIN) 500 MCG tablet Take 500 mcg by mouth daily.  [name] VITAMIN D PO Take 1 capsule by mouth daily.   No facility-administered encounter medications on file as of 07/02/2020.    Review of Systems  Constitutional: Negative for appetite change, chills, fatigue and fever.  HENT: Negative for congestion, postnasal drip, rhinorrhea, sinus pressure, sinus pain, sneezing, sore  throat and tinnitus.   Eyes: Negative for discharge, redness and itching.  Respiratory: Negative for cough, chest tightness, shortness of breath and wheezing.   Cardiovascular: Negative for chest pain, palpitations and leg swelling.  Skin: Negative for color change, pallor and rash.  Neurological: Negative for dizziness, speech difficulty, light-headedness and headaches.    Immunization History  Administered Date(s) Administered  . Fluad Quad(high Dose 65+) 05/16/2019, 05/31/2020  . Influenza,inj,Quad PF,6+ Mos 06/17/2017, 06/09/2018  . Influenza-Unspecified 06/11/2016  . PFIZER SARS-COV-2 Vaccination 10/20/2019, 11/14/2019  . Pneumococcal Conjugate-13 05/20/2018  . Pneumococcal Polysaccharide-23 02/15/2017  . Tdap 09/21/2017  . Zoster Recombinat (Shingrix) 07/19/2018   Pertinent  Health Maintenance Due  Topic Date Due  . MAMMOGRAM  02/07/2022  . COLONOSCOPY  11/03/2023  . INFLUENZA VACCINE  Completed  . DEXA SCAN  Completed  . PNA vac Low Risk Adult  Completed   Fall Risk  07/02/2020 02/26/2020 01/30/2020 01/12/2020 12/22/2019  Falls in the past year? 0 0 0 0 0  Number falls in past yr: 0 0 0 0 0  Injury with Fall? 0 0 0 0 0  Follow up - - - - -   Functional Status Survey:    Vitals:   07/02/20 0831  BP: 122/86  Pulse: 68  Temp: (!) 97.5 F (36.4 C)  TempSrc: Temporal  SpO2: 97%  Weight: 178 lb (80.7 kg)  Height: 5\' 9"  (1.753 m)   Body mass index is 26.29 kg/m. Physical Exam Vitals reviewed.  Constitutional:      General: She is not in acute distress.    Appearance: She is not ill-appearing.  HENT:     Head: Normocephalic.     Left Ear: There is impacted cerumen.     Ears:     Comments: Left ear lavaged with warm water and hydrogen peroxide moderate amounts of cerumen removed with alligator forceps.patient tolerated procedure well.small amounts removed from the right ear.Patient tolerated Procedure well. TM clear bilaterally.No bleeding or signs of  infection.verbalized improvement hearing.  Eyes:     General: No scleral icterus.       Right eye: No discharge.        Left eye: No discharge.     Conjunctiva/sclera: Conjunctivae normal.     Pupils: Pupils are equal, round, and reactive to light.  Cardiovascular:     Rate and Rhythm: Normal rate and regular rhythm.  Pulmonary:     Effort: Pulmonary effort is normal. No respiratory distress.     Breath sounds: Normal breath sounds. No wheezing, rhonchi or rales.  Chest:     Chest wall: No tenderness.  Skin:    General: Skin is warm and dry.     Coloration: Skin is not pale.     Findings: No bruising or erythema.  Neurological:     Mental Status: She is alert and oriented to person, place, and time.     Cranial Nerves: No cranial nerve deficit.     Sensory: No  sensory deficit.     Motor: No weakness.     Gait: Gait normal.  Psychiatric:        Mood and Affect: Mood normal.        Behavior: Behavior normal.        Thought Content: Thought content normal.        Judgment: Judgment normal.    Labs reviewed: Recent Labs    02/02/20 2204 02/03/20 0511 02/04/20 0720  NA 140 141 138  K 3.7 2.9* 4.6  CL 101 101 102  CO2 29 24 29   GLUCOSE 105* 195* 148*  BUN 19 17 15   CREATININE 0.99 1.21* 0.91  CALCIUM 9.5 9.4 9.5  MG  --  2.3 2.4   Recent Labs    01/02/20 1049 01/08/20 0842 02/03/20 0511  AST 20 18 31   ALT 28 25 27   ALKPHOS  --   --  46  BILITOT 0.8 1.0 0.6  PROT 6.4 6.2 7.5  ALBUMIN  --   --  4.2   Recent Labs    01/02/20 1049 01/02/20 1049 01/08/20 0842 02/02/20 2204 02/03/20 0511  WBC 13.2*   < > 6.4 8.8 10.1  NEUTROABS 8,884*  --  2,317  --  8.0*  HGB 13.9   < > 13.5 14.8 15.2*  HCT 41.7   < > 41.1 43.2 46.0  MCV 91.9   < > 92.2 92.1 95.6  PLT 301   < > 251 274 295   < > = values in this interval not displayed.   Lab Results  Component Value Date   TSH 1.43 08/16/2017   No results found for: HGBA1C Lab Results  Component Value Date   CHOL  141 01/08/2020   HDL 44 (L) 01/08/2020   LDLCALC 78 01/08/2020   TRIG 102 01/08/2020   CHOLHDL 3.2 01/08/2020    Significant Diagnostic Results in last 30 days:  No results found.  Assessment/Plan  Impacted cerumen of left ear Afebrile. Left ear lavaged with warm water and hydrogen peroxide moderate amounts of cerumen removed with alligator forceps.patient tolerated procedure well.small amounts removed from the right ear.Patient tolerated Procedure well.TM clear bilaterally.   - advised to notify provider for any signs of infection or pain in the ear.  Family/ staff Communication: Reviewed plan of care with patient  Labs/tests ordered: None   Next Appointment: Has upcoming appointment 07/30/2020 with PCP.   Sandrea Hughs, NP

## 2020-07-02 NOTE — Addendum Note (Signed)
Addended by: Tanna Savoy on: 07/02/2020 10:41 AM   Modules accepted: Orders

## 2020-07-04 ENCOUNTER — Other Ambulatory Visit: Payer: Self-pay

## 2020-07-04 ENCOUNTER — Ambulatory Visit (INDEPENDENT_AMBULATORY_CARE_PROVIDER_SITE_OTHER): Payer: 59

## 2020-07-04 DIAGNOSIS — J4550 Severe persistent asthma, uncomplicated: Secondary | ICD-10-CM

## 2020-07-04 MED ORDER — DUPILUMAB 300 MG/2ML ~~LOC~~ SOSY
300.0000 mg | PREFILLED_SYRINGE | Freq: Once | SUBCUTANEOUS | Status: AC
  Administered 2020-07-04: 300 mg via SUBCUTANEOUS

## 2020-07-04 NOTE — Progress Notes (Signed)
Have you been hospitalized within the last 10 days?  No Do you have a fever?  No Do you have a cough?  No Do you have a headache or sore throat? No Do you have your Epi Pen visible and is it within date?  Yes 

## 2020-07-12 ENCOUNTER — Other Ambulatory Visit: Payer: Self-pay

## 2020-07-12 DIAGNOSIS — Z79899 Other long term (current) drug therapy: Secondary | ICD-10-CM

## 2020-07-12 DIAGNOSIS — B20 Human immunodeficiency virus [HIV] disease: Secondary | ICD-10-CM

## 2020-07-12 DIAGNOSIS — Z113 Encounter for screening for infections with a predominantly sexual mode of transmission: Secondary | ICD-10-CM

## 2020-07-15 ENCOUNTER — Encounter: Payer: 59 | Admitting: Nurse Practitioner

## 2020-07-15 ENCOUNTER — Encounter: Payer: Self-pay | Admitting: Physical Therapy

## 2020-07-15 ENCOUNTER — Other Ambulatory Visit: Payer: Self-pay

## 2020-07-15 ENCOUNTER — Ambulatory Visit: Payer: 59 | Attending: Nurse Practitioner | Admitting: Physical Therapy

## 2020-07-15 DIAGNOSIS — M25512 Pain in left shoulder: Secondary | ICD-10-CM | POA: Diagnosis not present

## 2020-07-15 DIAGNOSIS — R262 Difficulty in walking, not elsewhere classified: Secondary | ICD-10-CM | POA: Diagnosis not present

## 2020-07-15 DIAGNOSIS — M25611 Stiffness of right shoulder, not elsewhere classified: Secondary | ICD-10-CM | POA: Insufficient documentation

## 2020-07-15 DIAGNOSIS — G8929 Other chronic pain: Secondary | ICD-10-CM | POA: Diagnosis not present

## 2020-07-15 DIAGNOSIS — M25511 Pain in right shoulder: Secondary | ICD-10-CM | POA: Diagnosis not present

## 2020-07-15 DIAGNOSIS — M25562 Pain in left knee: Secondary | ICD-10-CM | POA: Insufficient documentation

## 2020-07-15 DIAGNOSIS — M25561 Pain in right knee: Secondary | ICD-10-CM | POA: Insufficient documentation

## 2020-07-15 NOTE — Patient Instructions (Signed)
Access Code: [facility] URL: [email or web address] Date: 07/15/2020 Prepared by: Lum Babe  Exercises Standing Bilateral Low Shoulder Row with Anchored Resistance - 2 x daily - 7 x weekly - 1 sets - 10 reps - 3 hold Shoulder Extension with Resistance - 2 x daily - 7 x weekly - 1 sets - 10 reps - 3 hold Seated Shoulder External Rotation - 2 x daily - 7 x weekly - 1 sets - 10 reps - 3 hold Supine Short Arc Quad - 2 x daily - 7 x weekly - 1 sets - 10 reps - 3 hold

## 2020-07-15 NOTE — Therapy (Signed)
[address] [place], [place], [address] Phone: [phone]   Fax:  [phone]  Physical Therapy Evaluation  Patient Details  Name: Maria Adkins MRN: [id number] Date of Birth: 04-13-1951 Referring Provider (PT): Dewaine Oats   Encounter Date: 07/15/2020   PT End of Session - 07/15/20 0919    Visit Number 1    Date for PT Re-Evaluation 09/14/20    Authorization Type UMR/Medicare    PT Start Time 0844    PT Stop Time 0930    PT Time Calculation (min) 46 min    Activity Tolerance Patient tolerated treatment well    Behavior During Therapy [facility] for tasks assessed/performed           Past Medical History:  Diagnosis Date  . Asthma   . High blood pressure   . HIV (human immunodeficiency virus infection) ([place])   . Osteoarthritis     Past Surgical History:  Procedure Laterality Date  . Biopsy of Liver    . CATARACT EXTRACTION, BILATERAL    . CHOLECYSTECTOMY    . CYST REMOVAL HAND Left   . LAPAROSCOPIC SALPINGOOPHERECTOMY Right     There were no vitals filed for this visit.    Subjective Assessment - 07/15/20 0849    Subjective Patient reports that she has had shoulder and knee pain for a few years, reports may have injured shoulder with weights, reports that the MD feels it is arthritic in nature.  No past knee or shoulder surgeries.    Patient Stated Goals have less pain, move better    Currently in Pain? Yes    Pain Score 1     Pain Location Shoulder   bilateral knees   Pain Orientation Right;Left    Pain Descriptors / Indicators Aching    Pain Type Chronic pain    Pain Radiating Towards denies    Pain Onset More than a month ago    Pain Frequency Intermittent    Aggravating Factors  pain up to 10/10, if she has not moved the arms, squatting    Pain Relieving Factors rest, not moving, pain can be 0/10    Effect of Pain on Daily Activities difficulty lying on that side, difficulty with reaching up                [facility] PT Assessment - 07/15/20 0001      Assessment   Medical Diagnosis bilateral shoulder and knee pain    Referring Provider (PT) Dewaine Oats    Onset Date/Surgical Date 07/01/20    Prior Therapy no      Precautions   Precautions None      Balance Screen   Has the patient fallen in the past 6 months No    Has the patient had a decrease in activity level because of a fear of falling?  No    Is the patient reluctant to leave their home because of a fear of falling?  No      Home Environment   Additional Comments does housework      Prior Function   Level of Independence Independent    Vocation Retired    Leisure treadmill 1 hour couple of time a week, some weights in chair 3x/week      ROM / Strength   AROM / PROM / Strength AROM;Strength      AROM   AROM Assessment Site Shoulder;Knee    Right/Left Shoulder Right;Left    Right Shoulder Flexion  130 Degrees    Right Shoulder ABduction 100 Degrees    Right Shoulder Internal Rotation 30 Degrees    Right Shoulder External Rotation 50 Degrees    Left Shoulder Flexion 140 Degrees    Left Shoulder ABduction 100 Degrees    Left Shoulder Internal Rotation 50 Degrees    Left Shoulder External Rotation 80 Degrees    Right/Left Knee Right;Left    Right Knee Extension 0    Right Knee Flexion 110    Left Knee Extension 0    Left Knee Flexion 110      Strength   Overall Strength Comments left knee 4-/5 with pain, right knee 4/5 with mild pain, shoulders 4-/5 with pain right > left      Palpation   Palpation comment crepitus in bilateral knees left > right, some popping in the shoulders      Ambulation/Gait   Gait Comments has a difficult time standing after sitting and then really limps to start with                       Objective measurements completed on examination: See above findings.                 PT Short Term Goals - 07/15/20 0350      PT SHORT TERM GOAL #1   Title  independent with initial HEP    Time 2    Period Weeks    Status New             PT Long Term Goals - 07/15/20 0938      PT LONG TERM GOAL #1   Title understand safe HEP and gym activities    Time 8    Period Weeks    Status New      PT LONG TERM GOAL #2   Title increase right shoulder AROM to 150 degrees flexion    Time 8    Period Weeks    Status New      PT LONG TERM GOAL #3   Title increase AROM right shoulder to 65 degrees IR    Time 8    Period Weeks    Status New      PT LONG TERM GOAL #4   Title get up from sitting without difficulty    Time 8    Period Weeks    Status New                  Plan - 07/15/20 0920    Clinical Impression Statement Patient reports bilateral shoulder pain R>L, and bilateral knee pain L>R.  For a number of years, she is active with walking on a treadmill, feels like it is mostly DJD/arthritis, she has limited shoulder ROM, and difficulty with knee flexion, some lateral tracking issues for the patellae.  She has difficulty standing after sitting and difficulty with shoiulder motions after sitting    Stability/Clinical Decision Making Stable/Uncomplicated    Clinical Decision Making Low    Rehab Potential Good    PT Frequency 2x / week    PT Duration 8 weeks    PT Treatment/Interventions ADLs/Self Care Home Management;Cryotherapy;Electrical Stimulation;Iontophoresis 4mg /ml Dexamethasone;Moist Heat;Ultrasound;Gait training;Neuromuscular re-education;Balance training;Therapeutic exercise;Therapeutic activities;Functional mobility training;Stair training;Patient/family education    PT Next Visit Plan slowly start scapular strengthening, VMO    Consulted and Agree with Plan of Care Patient           Patient will benefit from skilled  therapeutic intervention in order to improve the following deficits and impairments:  Abnormal gait, Decreased activity tolerance, Decreased balance, Decreased mobility, Decreased strength, Impaired  flexibility, Pain, Difficulty walking, Decreased range of motion  Visit Diagnosis: Chronic right shoulder pain - Plan: PT plan of care cert/re-cert  Chronic left shoulder pain - Plan: PT plan of care cert/re-cert  Stiffness of right shoulder, not elsewhere classified - Plan: PT plan of care cert/re-cert  Chronic pain of left knee - Plan: PT plan of care cert/re-cert  Chronic pain of right knee - Plan: PT plan of care cert/re-cert  Difficulty in walking, not elsewhere classified - Plan: PT plan of care cert/re-cert     Problem List Patient Active Problem List   Diagnosis Date Noted  . Asthma exacerbation 02/03/2020  . Acute exacerbation of asthma with allergic rhinitis 02/02/2020  . Seasonal allergies 01/13/2018  . Asthma 02/05/2017  . Allergy 12/16/2016  . 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 Essential hypertension 08/19/2016  . Human immunodeficiency virus (HIV) disease ([place]) 08/19/2016    Sumner Boast., PT 07/15/2020, 9:28 AM  [address]. [place], [place], [address] Phone: [phone]   Fax:  [phone]  Name: Quiana Cobaugh MRN: [id number] Date of Birth: 1951/02/13

## 2020-07-17 ENCOUNTER — Other Ambulatory Visit (HOSPITAL_COMMUNITY)
Admission: RE | Admit: 2020-07-17 | Discharge: 2020-07-17 | Disposition: A | Discharge status: Home / Self Care | Payer: 59 | Source: Ambulatory Visit | Attending: Internal Medicine | Admitting: Internal Medicine

## 2020-07-17 ENCOUNTER — Other Ambulatory Visit: Payer: Self-pay

## 2020-07-17 ENCOUNTER — Other Ambulatory Visit: Payer: 59

## 2020-07-17 DIAGNOSIS — B20 Human immunodeficiency virus [HIV] disease: Secondary | ICD-10-CM | POA: Diagnosis not present

## 2020-07-17 DIAGNOSIS — Z113 Encounter for screening for infections with a predominantly sexual mode of transmission: Secondary | ICD-10-CM

## 2020-07-18 ENCOUNTER — Telehealth: Payer: Self-pay | Admitting: Critical Care Medicine

## 2020-07-18 ENCOUNTER — Ambulatory Visit (INDEPENDENT_AMBULATORY_CARE_PROVIDER_SITE_OTHER): Payer: 59

## 2020-07-18 DIAGNOSIS — J4550 Severe persistent asthma, uncomplicated: Secondary | ICD-10-CM | POA: Diagnosis not present

## 2020-07-18 LAB — URINE CYTOLOGY ANCILLARY ONLY
Chlamydia: NEGATIVE
Comment: NEGATIVE
Comment: NORMAL
Neisseria Gonorrhea: NEGATIVE

## 2020-07-18 LAB — T-HELPER CELL (CD4) - (RCID CLINIC ONLY)
CD4 % Helper T Cell: 23 % — ABNORMAL LOW (ref 33–65)
CD4 T Cell Abs: 526 /uL (ref 400–1790)

## 2020-07-18 MED ORDER — DUPIXENT 300 MG/2ML ~~LOC~~ SOAJ
300.0000 mg | SUBCUTANEOUS | 11 refills | Status: DC
Start: 2020-07-18 — End: 2020-08-14

## 2020-07-18 MED ORDER — DUPILUMAB 300 MG/2ML ~~LOC~~ SOSY
300.0000 mg | PREFILLED_SYRINGE | Freq: Once | SUBCUTANEOUS | Status: AC
  Administered 2020-07-18: 300 mg via SUBCUTANEOUS

## 2020-07-18 NOTE — Telephone Encounter (Signed)
Patient dropped of [place] my way for pharmacy team to review and fax for her.  Envelope placed in front desk pharmacy box.

## 2020-07-18 NOTE — Telephone Encounter (Signed)
Received and faxed to [facility] for PAP re-enrollment.

## 2020-07-18 NOTE — Progress Notes (Signed)
Have you been hospitalized within the last 10 days?  No Do you have a fever?  No Do you have a cough?  No Do you have a headache or sore throat? No Do you have your Epi Pen visible and is it within date?  Yes 

## 2020-07-20 LAB — CBC WITH DIFFERENTIAL/PLATELET
Absolute Monocytes: 596 cells/uL (ref 200–950)
Basophils Absolute: 83 cells/uL (ref 0–200)
Basophils Relative: 1.4 %
Eosinophils Absolute: 218 cells/uL (ref 15–500)
Eosinophils Relative: 3.7 %
HCT: 40.4 % (ref 35.0–45.0)
Hemoglobin: 13.6 g/dL (ref 11.7–15.5)
Lymphs Abs: 2331 cells/uL (ref 850–3900)
MCH: 30.7 pg (ref 27.0–33.0)
MCHC: 33.7 g/dL (ref 32.0–36.0)
MCV: 91.2 fL (ref 80.0–100.0)
MPV: 9.9 fL (ref 7.5–12.5)
Monocytes Relative: 10.1 %
Neutro Abs: 2673 cells/uL (ref 1500–7800)
Neutrophils Relative %: 45.3 %
Platelets: 251 10*3/uL (ref 140–400)
RBC: 4.43 10*6/uL (ref 3.80–5.10)
RDW: 12.8 % (ref 11.0–15.0)
Total Lymphocyte: 39.5 %
WBC: 5.9 10*3/uL (ref 3.8–10.8)

## 2020-07-20 LAB — COMPLETE METABOLIC PANEL WITH GFR
AG Ratio: 1.7 (calc) (ref 1.0–2.5)
ALT: 19 U/L (ref 6–29)
AST: 24 U/L (ref 10–35)
Albumin: 4 g/dL (ref 3.6–5.1)
Alkaline phosphatase (APISO): 48 U/L (ref 37–153)
BUN/Creatinine Ratio: 16 (calc) (ref 6–22)
BUN: 15 mg/dL (ref 7–25)
CO2: 33 mmol/L — ABNORMAL HIGH (ref 20–32)
Calcium: 9.9 mg/dL (ref 8.6–10.4)
Chloride: 103 mmol/L (ref 98–110)
Creat: 0.96 mg/dL — ABNORMAL HIGH (ref 0.60–0.93)
GFR, Est African American: 69 mL/min/{1.73_m2} (ref >=60)
GFR, Est Non African American: 60 mL/min/{1.73_m2} (ref >=60)
Globulin: 2.3 g/dL (calc) (ref 1.9–3.7)
Glucose, Bld: 93 mg/dL (ref 65–99)
Potassium: 4.3 mmol/L (ref 3.5–5.3)
Sodium: 141 mmol/L (ref 135–146)
Total Bilirubin: 0.8 mg/dL (ref 0.2–1.2)
Total Protein: 6.3 g/dL (ref 6.1–8.1)

## 2020-07-20 LAB — HIV-1 RNA QUANT-NO REFLEX-BLD
HIV 1 RNA Quant: <20 Copies/mL
HIV-1 RNA Quant, Log: <1.3 Log cps/mL

## 2020-07-20 LAB — RPR: RPR Ser Ql: NONREACTIVE

## 2020-07-22 ENCOUNTER — Telehealth: Payer: Self-pay | Admitting: Critical Care Medicine

## 2020-07-22 NOTE — Telephone Encounter (Signed)
Dupixent Order: 300mg  #2 prefilled syringe Ordered Date: 07/22/2020 Expected date of arrival: 07/23/20 Ordered by: [place]: Eveline Keto

## 2020-07-23 ENCOUNTER — Other Ambulatory Visit: Payer: Self-pay | Admitting: Internal Medicine

## 2020-07-23 DIAGNOSIS — B20 Human immunodeficiency virus [HIV] disease: Secondary | ICD-10-CM

## 2020-07-25 ENCOUNTER — Encounter: Payer: Self-pay | Admitting: Physical Therapy

## 2020-07-25 ENCOUNTER — Other Ambulatory Visit: Payer: Self-pay | Admitting: Nurse Practitioner

## 2020-07-25 ENCOUNTER — Other Ambulatory Visit: Payer: Self-pay

## 2020-07-25 ENCOUNTER — Ambulatory Visit: Payer: 59 | Admitting: Physical Therapy

## 2020-07-25 DIAGNOSIS — M25511 Pain in right shoulder: Secondary | ICD-10-CM | POA: Diagnosis not present

## 2020-07-25 DIAGNOSIS — M25611 Stiffness of right shoulder, not elsewhere classified: Secondary | ICD-10-CM | POA: Diagnosis not present

## 2020-07-25 DIAGNOSIS — M25562 Pain in left knee: Secondary | ICD-10-CM

## 2020-07-25 DIAGNOSIS — R262 Difficulty in walking, not elsewhere classified: Secondary | ICD-10-CM | POA: Diagnosis not present

## 2020-07-25 DIAGNOSIS — M25512 Pain in left shoulder: Secondary | ICD-10-CM

## 2020-07-25 DIAGNOSIS — M25561 Pain in right knee: Secondary | ICD-10-CM | POA: Diagnosis not present

## 2020-07-25 DIAGNOSIS — G8929 Other chronic pain: Secondary | ICD-10-CM

## 2020-07-25 MED FILL — METOPROLOL TARTRATE 100 MG: 100 | 90 days supply | Qty: 90 | Fill #0

## 2020-07-25 NOTE — Therapy (Signed)
[address]. [place], [place], [address] Phone: [phone]   Fax:  [phone]  Physical Therapy Treatment  Patient Details  Name: Maria Adkins MRN: [id number] Date of Birth: 1951/03/15 Referring Provider (PT): Dewaine Oats   Encounter Date: 07/25/2020   PT End of Session - 07/25/20 0922    Visit Number 2    Date for PT Re-Evaluation 09/14/20    Authorization Type UMR/Medicare    PT Start Time 0840    PT Stop Time 0927    PT Time Calculation (min) 47 min    Activity Tolerance Patient tolerated treatment well    Behavior During Therapy [facility] for tasks assessed/performed           Past Medical History:  Diagnosis Date  . Asthma   . High blood pressure   . HIV (human immunodeficiency virus infection) ([place])   . Osteoarthritis     Past Surgical History:  Procedure Laterality Date  . Biopsy of Liver    . CATARACT EXTRACTION, BILATERAL    . CHOLECYSTECTOMY    . CYST REMOVAL HAND Left   . LAPAROSCOPIC SALPINGOOPHERECTOMY Right     There were no vitals filed for this visit.   Subjective Assessment - 07/25/20 0839    Subjective Patietn reports that she is trying to use the band to exercise at home, reports feeling okay today    Currently in Pain? No/denies    Pain Score 0-No pain                             [facility] Adult PT Treatment/Exercise - 07/25/20 0001      Exercises   Exercises Shoulder      Shoulder Exercises: Supine   Other Supine Exercises feet on ball K2C, small trunk rotation, small bridges and isometric abs      Shoulder Exercises: Seated   External Rotation Both;Strengthening;20 reps;Theraband    Theraband Level (Shoulder External Rotation) Level 2 (Red)    Other Seated Exercises 3# LAQ 2x10, green tband HS curls    Other Seated Exercises bent over row 2#, bent over extension 1#      Shoulder Exercises: Standing   Internal Rotation Both;10 reps    Internal Rotation  Limitations with wand behind back    Extension AAROM;15 reps    Extension Limitations with wand behind back      Shoulder Exercises: ROM/Strengthening   UBE (Upper Arm Bike) level 1 x 4 minutes    Nustep level 5 x 2 minutes, stopped due to left knee pain a 5/10    Lat Pull --    Lat Pull Limitations 10# 2x10    Cybex Row --    Cybex Row Limitations 5# 2x10    Wall Wash felxion and circles both arms    "W" Arms 2x5 really having to ease in    Other ROM/Strengthening Exercises 15# triceps, 5# biceps 2x10      Shoulder Exercises: Stretch   Corner Stretch 3 reps;10 seconds    Other Shoulder Stretches calf stretch                    PT Short Term Goals - 07/25/20 0925      PT SHORT TERM GOAL #1   Title independent with initial HEP    Status Partially Met             PT Long Term Goals -  07/15/20 0924      PT LONG TERM GOAL #1   Title understand safe HEP and gym activities    Time 8    Period Weeks    Status New      PT LONG TERM GOAL #2   Title increase right shoulder AROM to 150 degrees flexion    Time 8    Period Weeks    Status New      PT LONG TERM GOAL #3   Title increase AROM right shoulder to 65 degrees IR    Time 8    Period Weeks    Status New      PT LONG TERM GOAL #4   Title get up from sitting without difficulty    Time 8    Period Weeks    Status New                 Plan - 07/25/20 0263    Clinical Impression Statement Patient with many c/o pain throughout the session, needed education on motions and trying to be flexible and strong to suppor the arthritic joints.  She needed cues for many of the exercises to not go to extreme motions    PT Next Visit Plan slowly start scapular strengthening, VMO    Consulted and Agree with Plan of Care Patient           Patient will benefit from skilled therapeutic intervention in order to improve the following deficits and impairments:  Abnormal gait, Decreased activity tolerance,  Decreased balance, Decreased mobility, Decreased strength, Impaired flexibility, Pain, Difficulty walking, Decreased range of motion  Visit Diagnosis: Chronic right shoulder pain  Chronic left shoulder pain  Stiffness of right shoulder, not elsewhere classified  Chronic pain of left knee  Chronic pain of right knee  Difficulty in walking, not elsewhere classified     Problem List Patient Active Problem List   Diagnosis Date Noted  . Asthma exacerbation 02/03/2020  . Acute exacerbation of asthma with allergic rhinitis 02/02/2020  . Seasonal allergies 01/13/2018  . Asthma 02/05/2017  . Allergy 12/16/2016  . 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 Essential hypertension 08/19/2016  . Human immunodeficiency virus (HIV) disease ([place]) 08/19/2016    Sumner Boast., PT 07/25/2020, 9:26 AM  [address]. [place], [place], [address] Phone: [phone]   Fax:  [phone]  Name: Maria Adkins MRN: [id number] Date of Birth: 11-Sep-1951

## 2020-07-26 MED FILL — MONTELUKAST SOD 10 MG TAB: 10 | 30 days supply | Qty: 30 | Fill #4

## 2020-07-26 MED FILL — ENALAPRIL-HCTZ 10-25 MG TAB: 10-25 | 90 days supply | Qty: 90 | Fill #0

## 2020-07-26 NOTE — Telephone Encounter (Signed)
Dupixent Shipment Received: 300mg  #2 prefilled pen Medication arrival date: 07/26/20 Lot #: [id number] Exp date: 05/14/2022 Received by: Elliot Dally

## 2020-07-29 ENCOUNTER — Other Ambulatory Visit: Payer: Self-pay

## 2020-07-29 ENCOUNTER — Ambulatory Visit: Payer: 59 | Admitting: Physical Therapy

## 2020-07-29 ENCOUNTER — Encounter: Payer: Self-pay | Admitting: Physical Therapy

## 2020-07-29 DIAGNOSIS — R262 Difficulty in walking, not elsewhere classified: Secondary | ICD-10-CM | POA: Diagnosis not present

## 2020-07-29 DIAGNOSIS — M25511 Pain in right shoulder: Secondary | ICD-10-CM | POA: Diagnosis not present

## 2020-07-29 DIAGNOSIS — G8929 Other chronic pain: Secondary | ICD-10-CM | POA: Diagnosis not present

## 2020-07-29 DIAGNOSIS — M25562 Pain in left knee: Secondary | ICD-10-CM

## 2020-07-29 DIAGNOSIS — M25512 Pain in left shoulder: Secondary | ICD-10-CM | POA: Diagnosis not present

## 2020-07-29 DIAGNOSIS — M25561 Pain in right knee: Secondary | ICD-10-CM

## 2020-07-29 DIAGNOSIS — M25611 Stiffness of right shoulder, not elsewhere classified: Secondary | ICD-10-CM

## 2020-07-29 NOTE — Therapy (Addendum)
[address]. [place], [place], [address] Phone: [phone]   Fax:  [phone]  Physical Therapy Treatment  Patient Details  Name: Maria Adkins MRN: [id number] Date of Birth: 1950/11/22 Referring Provider (PT): Dewaine Oats   Encounter Date: 07/29/2020   PT End of Session - 07/29/20 1053    Visit Number 3    Date for PT Re-Evaluation 09/14/20    Authorization Type UMR/Medicare    PT Start Time 0840    PT Stop Time 0925    PT Time Calculation (min) 45 min    Activity Tolerance Patient tolerated treatment well    Behavior During Therapy [facility] for tasks assessed/performed           Past Medical History:  Diagnosis Date  . Asthma   . High blood pressure   . HIV (human immunodeficiency virus infection) ([place])   . Osteoarthritis     Past Surgical History:  Procedure Laterality Date  . Biopsy of Liver    . CATARACT EXTRACTION, BILATERAL    . CHOLECYSTECTOMY    . CYST REMOVAL HAND Left   . LAPAROSCOPIC SALPINGOOPHERECTOMY Right     There were no vitals filed for this visit.   Subjective Assessment - 07/29/20 0927    Subjective Reports feeling a little better    Currently in Pain? No/denies                             [facility] Adult PT Treatment/Exercise - 07/29/20 0001      Shoulder Exercises: Standing   External Rotation Both;20 reps;Theraband    Theraband Level (Shoulder External Rotation) Level 2 (Red)    Internal Rotation Both;20 reps;Theraband    Theraband Level (Shoulder Internal Rotation) Level 2 (Red)    Extension AAROM;15 reps    Extension Limitations with wand behind back    Other Standing Exercises resisted gait all directions, fatigued and needed to sit with side stepping      Shoulder Exercises: ROM/Strengthening   UBE (Upper Arm Bike) level 1 x 4 minutes    Nustep level 3 x 5 minutes, stopped due to left knee pain a 5/10    Lat Pull Limitations 10# 2x10    Cybex Row  Limitations 5# 2x10    "W" Arms 2x5 really having to ease in    Other ROM/Strengthening Exercises 5# knee extension 2x10, 20# knee flexion 2x10, 20# leg press 2x10    Other ROM/Strengthening Exercises 15# triceps, 5# biceps 2x10      Shoulder Exercises: Stretch   Corner Stretch 3 reps;10 seconds                    PT Short Term Goals - 07/29/20 1056      PT SHORT TERM GOAL #1   Title independent with initial HEP    Status Achieved             PT Long Term Goals - 07/15/20 7858      PT LONG TERM GOAL #1   Title understand safe HEP and gym activities    Time 8    Period Weeks    Status New      PT LONG TERM GOAL #2   Title increase right shoulder AROM to 150 degrees flexion    Time 8    Period Weeks    Status New      PT LONG TERM GOAL #3  Title increase AROM right shoulder to 65 degrees IR    Time 8    Period Weeks    Status New      PT LONG TERM GOAL #4   Title get up from sitting without difficulty    Time 8    Period Weeks    Status New                 Plan - 07/29/20 1054    Clinical Impression Statement Patient reports that she is feeling a little stronger, reports some pain and soreness in the right shoulder and left knee after the exercises, she does need cues verbal and tactile to perform correctly with smaller ROM to limit pain and popping for both the shoulders and th eknee exercicses    PT Next Visit Plan continue to progress as tolerated    Consulted and Agree with Plan of Care Patient           Patient will benefit from skilled therapeutic intervention in order to improve the following deficits and impairments:  Abnormal gait, Decreased activity tolerance, Decreased balance, Decreased mobility, Decreased strength, Impaired flexibility, Pain, Difficulty walking, Decreased range of motion  Visit Diagnosis: Chronic right shoulder pain  Chronic left shoulder pain  Stiffness of right shoulder, not elsewhere  classified  Chronic pain of left knee  Chronic pain of right knee  Difficulty in walking, not elsewhere classified     Problem List Patient Active Problem List   Diagnosis Date Noted  . Asthma exacerbation 02/03/2020  . Acute exacerbation of asthma with allergic rhinitis 02/02/2020  . Seasonal allergies 01/13/2018  . Asthma 02/05/2017  . Allergy 12/16/2016  . 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 Essential hypertension 08/19/2016  . Human immunodeficiency virus (HIV) disease ([place]) 08/19/2016   PHYSICAL THERAPY DISCHARGE SUMMARY   Plan: Patient agrees to discharge.  Patient goals were not met. Patient is being discharged due to not returning since the last visit.  ?????      Sumner Boast., PT 07/29/2020, 10:57 AM  [place]. [place], [place], [address] Phone: [phone]   Fax:  [phone]  Name: Maria Adkins MRN: [id number] Date of Birth: [birth year]

## 2020-07-30 ENCOUNTER — Telehealth: Payer: Self-pay

## 2020-07-30 ENCOUNTER — Encounter: Payer: Self-pay | Admitting: Nurse Practitioner

## 2020-07-30 ENCOUNTER — Ambulatory Visit (INDEPENDENT_AMBULATORY_CARE_PROVIDER_SITE_OTHER): Payer: 59 | Admitting: Nurse Practitioner

## 2020-07-30 DIAGNOSIS — Z0000 Encounter for general adult medical examination without abnormal findings: Secondary | ICD-10-CM

## 2020-07-30 NOTE — Progress Notes (Signed)
Subjective:   Charron Coultas is a 69 y.o. female who presents for Medicare Annual (Subsequent) preventive examination.  Review of Systems     Cardiac Risk Factors include: hypertension;advanced age (>42men, >21 women)     Objective:    There were no vitals filed for this visit. There is no height or weight on file to calculate BMI.  Advanced Directives 07/30/2020 07/02/2020 05/31/2020 02/26/2020 02/02/2020 01/30/2020 01/12/2020  Does Patient Have a Medical Advance Directive? Yes No No No No No Yes  Type of Advance Directive [place]  Does patient want to make changes to medical advance directive? No - Patient declined - - No - Patient declined - No - Patient declined No - Patient declined  Copy of [place] in Chart? No - copy requested - - - - - -  Would patient like information on creating a medical advance directive? - Yes (MAU/Ambulatory/Procedural Areas - Information given) Yes (MAU/Ambulatory/Procedural Areas - Information given) - No - Guardian declined - -    Current Medications (verified) Outpatient Encounter Medications as of 07/30/2020  Medication Sig  . albuterol (PROVENTIL) (2.5 MG/3ML) 0.083% nebulizer solution Take 3 mLs (2.5 mg total) by nebulization every 4 (four) hours as needed for wheezing or shortness of breath.  [name] albuterol (VENTOLIN HFA) 108 (90 Base) MCG/ACT inhaler INHALE 2 PUFFS BY MOUTH INTO THE LUNGS EVERY 4 HOURS AS NEEDED  . BIKTARVY 50-200-25 MG TABS tablet TAKE 1 TABLET BY MOUTH DAILY.  [name] Blood Pressure Monitoring (BLOOD PRESSURE CUFF) MISC 1 Device by Does not apply route daily. DX: I10  . Calcium Carbonate (CALCIUM 600 PO) Take 1 tablet by mouth daily.   . cetirizine (ZYRTEC) 10 MG tablet Take 1 tablet (10 mg total) by mouth daily.  . Dupilumab (DUPIXENT) 300 MG/2ML SOPN Inject 300 mg into the skin every 14 (fourteen) days.  . enalapril-hydrochlorothiazide (VASERETIC) 10-25 MG tablet TAKE 1 TABLET BY  MOUTH EVERY DAY  . fluticasone (FLONASE) 50 MCG/ACT nasal spray Place 2 sprays into both nostrils daily.  . Fluticasone-Salmeterol (ADVAIR DISKUS) 500-50 MCG/DOSE AEPB Inhale 1 puff into the lungs 2 (two) times daily.  [name] ibuprofen (ADVIL) 200 MG tablet Take 400 mg by mouth at bedtime. For shoulder and knee pain  . metoprolol tartrate (LOPRESSOR) 100 MG tablet TAKE 1 TABLET BY MOUTH ONCE DAILY  . montelukast (SINGULAIR) 10 MG tablet Take 1 tablet (10 mg total) by mouth at bedtime.  . Multiple Vitamins-Minerals (MULTIVITAMIN GUMMIES ADULT PO) Take 2 tablets by mouth daily. Gummy  . Omega-3 Fatty Acids (FISH OIL) 1000 MG CAPS Take by mouth.  . pravastatin (PRAVACHOL) 20 MG tablet Take 1 tablet (20 mg total) by mouth daily.  . Saccharomyces boulardii (PROBIOTIC) 250 MG CAPS By mouth daily  . vitamin B-12 (CYANOCOBALAMIN) 500 MCG tablet Take 500 mcg by mouth daily.  [name] VITAMIN D PO Take 1 capsule by mouth daily.  . [DISCONTINUED] Fluticasone-Salmeterol (ADVAIR DISKUS) 500-50 MCG/DOSE AEPB Inhale 1 puff into the lungs 2 (two) times daily.   No facility-administered encounter medications on file as of 07/30/2020.    Allergies (verified) Patient has no known allergies.   History: Past Medical History:  Diagnosis Date  . Asthma   . High blood pressure   . HIV (human immunodeficiency virus infection) ([place])   . Osteoarthritis    Past Surgical History:  Procedure Laterality Date  . Biopsy of Liver    . CATARACT EXTRACTION,  BILATERAL    . CHOLECYSTECTOMY    . CYST REMOVAL HAND Left   . LAPAROSCOPIC SALPINGOOPHERECTOMY Right    Family History  Problem Relation Age of Onset  . Diabetes Mother        died at age 62  . Hypertension Mother   . Asthma Sister   . Diabetes Sister   . Hypertension Sister   . Arthritis Sister   . Diabetes Sister   . Hypertension Sister   . Asthma Sister   . Arthritis Sister   . Asthma Son        controlled   Social History   Socioeconomic History  .  Marital status: Married    Spouse name: Not on file  . Number of children: Not on file  . Years of education: Not on file  . Highest education level: Not on file  Occupational History  . Not on file  Tobacco Use  . Smoking status: Former Smoker    Packs/day: 1.00    Years: 14.00    Pack years: 14.00    Types: Cigarettes    Quit date: 09/14/1978    Years since quitting: 41.9  . Smokeless tobacco: Never Used  Vaping Use  . Vaping Use: Never used  Substance and Sexual Activity  . Alcohol use: No  . Drug use: No  . Sexual activity: Yes    Partners: Male    Birth control/protection: Condom    Comment: declined condoms  Other Topics Concern  . Not on file  Social History Narrative   Diet? Regular      Do you drink/eat things with caffeine? Coffee      Marital status?  Married                                  What year were you married? March 16, 2009      Do you live in a house, apartment, assisted living, condo, trailer, etc.? House      Is it one or more stories? 1 story      How many persons live in your home? 2      Do you have any pets in your home? (please list) yes-dog      Current or past profession: Administrator      Do you exercise?  no                                    Type & how often? n/a      Do you have a living will? no      Do you have a DNR form?     no                             If not, do you want to discuss one? Yes      Do you have signed POA/HPOA for forms? no      Social Determinants of Health   Financial Resource Strain:   . Difficulty of Paying Living Expenses: Not on file  Food Insecurity:   . Worried About Charity fundraiser in the Last Year: Not on file  . Ran Out of Food in the Last Year: Not on file  Transportation Needs:   . Lack of Transportation (Medical): Not on file  .  Lack of Transportation (Non-Medical): Not on file  Physical Activity:   . Days of Exercise per Week: Not on file  . Minutes of Exercise per Session: Not on  file  Stress:   . Feeling of Stress : Not on file  Social Connections:   . Frequency of Communication with Friends and Family: Not on file  . Frequency of Social Gatherings with Friends and Family: Not on file  . Attends Religious Services: Not on file  . Active Member of Clubs or Organizations: Not on file  . Attends Archivist Meetings: Not on file  . Marital Status: Not on file    Tobacco Counseling Counseling given: Not Answered   Clinical Intake:  Pre-visit preparation completed: Yes  Pain : No/denies pain     BMI - recorded: 26 Nutritional Status: BMI 25 -29 Overweight Diabetes: No  How often do you need to have someone help you when you read instructions, pamphlets, or other written materials from your doctor or pharmacy?: 1 - Never  Diabetic?no         Activities of Daily Living In your present state of health, do you have any difficulty performing the following activities: 07/30/2020 02/03/2020  Hearing? N N  Vision? N N  Difficulty concentrating or making decisions? N N  Walking or climbing stairs? N N  Dressing or bathing? N N  Doing errands, shopping? N N  Preparing Food and eating ? N -  Using the Toilet? N -  In the past six months, have you accidently leaked urine? N -  Do you have problems with loss of bowel control? N -  Managing your Medications? N -  Managing your Finances? N -  Housekeeping or managing your Housekeeping? N -  Some recent data might be hidden    Patient Care Team: Lauree Chandler, NP as PCP - General (Geriatric Medicine)  Indicate any recent Medical Services you may have received from other than [facility] providers in the past year (date may be approximate).     Assessment:   This is a routine wellness examination for [place].  Hearing/Vision screen  Hearing Screening   125Hz  250Hz  500Hz  1000Hz  2000Hz  3000Hz  4000Hz  6000Hz  8000Hz   Right ear:           Left ear:           Comments: Patient states she has no  hearing problems.  Vision Screening Comments: Patient wears reading glasses. Patient has not had recent eye exam, but plans on making an appointment.  Dietary issues and exercise activities discussed: Current Exercise Habits: Home exercise routine, Type of exercise: walking;stretching;strength training/weights;calisthenics, Time (Minutes): 45, Frequency (Times/Week): 5, Weekly Exercise (Minutes/Week): 225  Goals    . Increase physical activity     Starting 09/16/2016, I will attempt to increase my physical activity to 3 times per day.     . Increase physical activity     To get back to walking- 1.5 hours       Depression Screen PHQ 2/9 Scores 07/30/2020 07/02/2020 01/12/2020 10/09/2019 07/14/2019 02/21/2019 10/24/2018  PHQ - 2 Score 0 0 0 1 0 0 0  PHQ- 9 Score - - - - - - -  Exception Documentation - - - - Other- indicate reason in comment box - -  Not completed - - - - Patient already under the care of a counslor - -    Fall Risk Fall Risk  07/30/2020 07/02/2020 02/26/2020 01/30/2020 01/12/2020  Falls in the past  year? 0 0 0 0 0  Number falls in past yr: 0 0 0 0 0  Injury with Fall? 0 0 0 0 0  Follow up - - - - -    Any stairs in or around the home? Yes  If so, are there any without handrails? No  Home free of loose throw rugs in walkways, pet beds, electrical cords, etc? Yes  Adequate lighting in your home to reduce risk of falls? Yes   ASSISTIVE DEVICES UTILIZED TO PREVENT FALLS:  Life alert? No  Use of a cane, walker or w/c? No  Grab bars in the bathroom? Yes  Shower chair or bench in shower? Yes  Elevated toilet seat or a handicapped toilet? Yes   TIMED UP AND GO:  Was the test performed? No .   Cognitive Function: MMSE - Mini Mental State Exam 07/11/2018 09/16/2016  Orientation to time 5 5  Orientation to Place 5 5  Registration 3 3  Attention/ Calculation 5 5  Recall 2 3  Language- name 2 objects 2 2  Language- repeat 1 1  Language- follow 3 step command 3 3    Language- read & follow direction 1 1  Write a sentence 1 1  Copy design 1 1  Total score 29 30     6CIT Screen 07/30/2020 07/14/2019  What Year? 0 points 0 points  What month? 0 points 0 points  What time? 0 points 0 points  Count back from 20 0 points 0 points  Months in reverse 0 points 0 points  Repeat phrase 0 points 0 points  Total Score 0 0    Immunizations Immunization History  Administered Date(s) Administered  . Fluad Quad(high Dose 65+) 05/16/2019, 05/31/2020  . Influenza,inj,Quad PF,6+ Mos 06/17/2017, 06/09/2018  . Influenza-Unspecified 06/11/2016  . PFIZER SARS-COV-2 Vaccination 10/20/2019, 11/14/2019  . Pneumococcal Conjugate-13 05/20/2018  . Pneumococcal Polysaccharide-23 02/15/2017  . Tdap 09/21/2017  . Zoster Recombinat (Shingrix) 07/19/2018    TDAP status: Up to date Flu Vaccine status: Up to date Pneumococcal vaccine status: Up to date Covid-19 vaccine status: Completed vaccines  Qualifies for Shingles Vaccine? Yes   Zostavax completed No   Shingrix Completed?: Yes  Screening Tests Health Maintenance  Topic Date Due  . MAMMOGRAM  02/07/2022  . COLONOSCOPY  11/03/2023  . TETANUS/TDAP  09/22/2027  . INFLUENZA VACCINE  Completed  . DEXA SCAN  Completed  . COVID-19 Vaccine  Completed  . Hepatitis C Screening  Completed  . PNA vac Low Risk Adult  Completed    Health Maintenance  There are no preventive care reminders to display for this patient.  Colorectal cancer screening: Completed 2015. Repeat every 10 years Mammogram status: Completed 01/19/2020. Repeat every year Bone Density status: Ordered scheduled for 2022. Pt provided with contact info and advised to call to schedule appt.  Lung Cancer Screening: (Low Dose CT Chest recommended if Age 61-80 years, 30 pack-year currently smoking OR have quit w/in 15years.) does not qualify.   Lung Cancer Screening Referral: na  Additional Screening:  Hepatitis C Screening: does qualify; Completed  09/24/2016   Vision Screening: Recommended annual ophthalmology exams for early detection of glaucoma and other disorders of the eye. Is the patient up to date with their annual eye exam?  Yes  Who is the provider or what is the name of the office in which the patient attends annual eye exams? Unsure of name If pt is not established with a provider, would they like to  be referred to a provider to establish care? No .   Dental Screening: Recommended annual dental exams for proper oral hygiene  Community Resource Referral / Chronic Care Management: CRR required this visit?  No   CCM required this visit?  No      Plan:     I have personally reviewed and noted the following in the patient's chart:   . Medical and social history . Use of alcohol, tobacco or illicit drugs  . Current medications and supplements . Functional ability and status . Nutritional status . Physical activity . Advanced directives . List of other physicians . Hospitalizations, surgeries, and ER visits in previous 12 months . Vitals . Screenings to include cognitive, depression, and falls . Referrals and appointments  In addition, I have reviewed and discussed with patient certain preventive protocols, quality metrics, and best practice recommendations. A written personalized care plan for preventive services as well as general preventive health recommendations were provided to patient.     Lauree Chandler, NP   07/30/2020   Virtual Visit via Telephone Note  I connected with@ on 07/30/20 at  9:00 AM EST by telephone and verified that I am speaking with the correct person using two identifiers.  Location: Patient: home Provider: [facility]   I discussed the limitations, risks, security and privacy concerns of performing an evaluation and management service by telephone and the availability of in person appointments. I also discussed with the patient that there may be a patient responsible charge related to  this service. The patient expressed understanding and agreed to proceed.   I discussed the assessment and treatment plan with the patient. The patient was provided an opportunity to ask questions and all were answered. The patient agreed with the plan and demonstrated an understanding of the instructions.   The patient was advised to call back or seek an in-person evaluation if the symptoms worsen or if the condition fails to improve as anticipated.  I provided 16 minutes of non-face-to-face time during this encounter.  [employer]. Harle Battiest Avs printed and mailed

## 2020-07-30 NOTE — Patient Instructions (Addendum)
[name] , Thank you for taking time to come for your Medicare Wellness Visit. I appreciate your ongoing commitment to your health goals. Please review the following plan we discussed and let me know if I can assist you in the future.   Screening recommendations/referrals: Colonoscopy up to date Mammogram up to date Bone Density scheduled.  Recommended yearly ophthalmology/optometry visit for glaucoma screening and checkup Recommended yearly dental visit for hygiene and checkup  Vaccinations: Influenza vaccine up to date Pneumococcal vaccine up to date Tdap vaccine up to date Shingles vaccine up to date    Advanced directives: to complete and bring to office.   Conditions/risks identified: advanced age.  Next appointment: 1 year for AWV    Preventive Care 64 Years and Older, Female Preventive care refers to lifestyle choices and visits with your health care provider that can promote health and wellness. What does preventive care include?  A yearly physical exam. This is also called an annual well check.  Dental exams once or twice a year.  Routine eye exams. Ask your health care provider how often you should have your eyes checked.  Personal lifestyle choices, including:  Daily care of your teeth and gums.  Regular physical activity.  Eating a healthy diet.  Avoiding tobacco and drug use.  Limiting alcohol use.  Practicing safe sex.  Taking low-dose aspirin every day.  Taking vitamin and mineral supplements as recommended by your health care provider. What happens during an annual well check? The services and screenings done by your health care provider during your annual well check will depend on your age, overall health, lifestyle risk factors, and family history of disease. Counseling  Your health care provider may ask you questions about your:  Alcohol use.  Tobacco use.  Drug use.  Emotional well-being.  Home and relationship well-being.  Sexual  activity.  Eating habits.  History of falls.  Memory and ability to understand (cognition).  Work and work Statistician.  Reproductive health. Screening  You may have the following tests or measurements:  Height, weight, and BMI.  Blood pressure.  Lipid and cholesterol levels. These may be checked every 5 years, or more frequently if you are over 86 years old.  Skin check.  Lung cancer screening. You may have this screening every year starting at age 58 if you have a 30-pack-year history of smoking and currently smoke or have quit within the past 15 years.  Fecal occult blood test (FOBT) of the stool. You may have this test every year starting at age 38.  Flexible sigmoidoscopy or colonoscopy. You may have a sigmoidoscopy every 5 years or a colonoscopy every 10 years starting at age 72.  Hepatitis C blood test.  Hepatitis B blood test.  Sexually transmitted disease (STD) testing.  Diabetes screening. This is done by checking your blood sugar (glucose) after you have not eaten for a while (fasting). You may have this done every 1-3 years.  Bone density scan. This is done to screen for osteoporosis. You may have this done starting at age 36.  Mammogram. This may be done every 1-2 years. Talk to your health care provider about how often you should have regular mammograms. Talk with your health care provider about your test results, treatment options, and if necessary, the need for more tests. Vaccines  Your health care provider may recommend certain vaccines, such as:  Influenza vaccine. This is recommended every year.  Tetanus, diphtheria, and acellular pertussis (Tdap, Td) vaccine. You may  need a Td booster every 10 years.  Zoster vaccine. You may need this after age 92.  Pneumococcal 13-valent conjugate (PCV13) vaccine. One dose is recommended after age 63.  Pneumococcal polysaccharide (PPSV23) vaccine. One dose is recommended after age 48. Talk to your health care  provider about which screenings and vaccines you need and how often you need them. This information is not intended to replace advice given to you by your health care provider. Make sure you discuss any questions you have with your health care provider. Document Released: 09/27/2015 Document Revised: 05/20/2016 Document Reviewed: 07/02/2015 Elsevier Interactive Patient Education  2017 [place] Prevention in the Home Falls can cause injuries. They can happen to people of all ages. There are many things you can do to make your home safe and to help prevent falls. What can I do on the outside of my home?  Regularly fix the edges of walkways and driveways and fix any cracks.  Remove anything that might make you trip as you walk through a door, such as a raised step or threshold.  Trim any bushes or trees on the path to your home.  Use bright outdoor lighting.  Clear any walking paths of anything that might make someone trip, such as rocks or tools.  Regularly check to see if handrails are loose or broken. Make sure that both sides of any steps have handrails.  Any raised decks and porches should have guardrails on the edges.  Have any leaves, snow, or ice cleared regularly.  Use sand or salt on walking paths during winter.  Clean up any spills in your garage right away. This includes oil or grease spills. What can I do in the bathroom?  Use night lights.  Install grab bars by the toilet and in the tub and shower. Do not use towel bars as grab bars.  Use non-skid mats or decals in the tub or shower.  If you need to sit down in the shower, use a plastic, non-slip stool.  Keep the floor dry. Clean up any water that spills on the floor as soon as it happens.  Remove soap buildup in the tub or shower regularly.  Attach bath mats securely with double-sided non-slip rug tape.  Do not have throw rugs and other things on the floor that can make you trip. What can I do in  the bedroom?  Use night lights.  Make sure that you have a light by your bed that is easy to reach.  Do not use any sheets or blankets that are too big for your bed. They should not hang down onto the floor.  Have a firm chair that has side arms. You can use this for support while you get dressed.  Do not have throw rugs and other things on the floor that can make you trip. What can I do in the kitchen?  Clean up any spills right away.  Avoid walking on wet floors.  Keep items that you use a lot in easy-to-reach places.  If you need to reach something above you, use a strong step stool that has a grab bar.  Keep electrical cords out of the way.  Do not use floor polish or wax that makes floors slippery. If you must use wax, use non-skid floor wax.  Do not have throw rugs and other things on the floor that can make you trip. What can I do with my stairs?  Do not leave any items on  the stairs.  Make sure that there are handrails on both sides of the stairs and use them. Fix handrails that are broken or loose. Make sure that handrails are as long as the stairways.  Check any carpeting to make sure that it is firmly attached to the stairs. Fix any carpet that is loose or worn.  Avoid having throw rugs at the top or bottom of the stairs. If you do have throw rugs, attach them to the floor with carpet tape.  Make sure that you have a light switch at the top of the stairs and the bottom of the stairs. If you do not have them, ask someone to add them for you. What else can I do to help prevent falls?  Wear shoes that:  Do not have high heels.  Have rubber bottoms.  Are comfortable and fit you well.  Are closed at the toe. Do not wear sandals.  If you use a stepladder:  Make sure that it is fully opened. Do not climb a closed stepladder.  Make sure that both sides of the stepladder are locked into place.  Ask someone to hold it for you, if possible.  Clearly mark and  make sure that you can see:  Any grab bars or handrails.  First and last steps.  Where the edge of each step is.  Use tools that help you move around (mobility aids) if they are needed. These include:  Canes.  Walkers.  Scooters.  Crutches.  Turn on the lights when you go into a dark area. Replace any light bulbs as soon as they burn out.  Set up your furniture so you have a clear path. Avoid moving your furniture around.  If any of your floors are uneven, fix them.  If there are any pets around you, be aware of where they are.  Review your medicines with your doctor. Some medicines can make you feel dizzy. This can increase your chance of falling. Ask your doctor what other things that you can do to help prevent falls. This information is not intended to replace advice given to you by your health care provider. Make sure you discuss any questions you have with your health care provider. Document Released: 06/27/2009 Document Revised: 02/06/2016 Document Reviewed: 10/05/2014 Elsevier Interactive Patient Education  2017 [employer].

## 2020-07-30 NOTE — Progress Notes (Signed)
This service is provided via telemedicine  No vital signs collected/recorded due to the encounter was a telemedicine visit.   Location of patient (ex: home, work):  Home  Patient consents to a telephone visit:  Yes, see encounter dated 07/30/2020  Location of the provider (ex: office, home):  [place]  Name of any referring provider:  N/A  Names of all persons participating in the telemedicine service and their role in the encounter:  Sherrie Mustache, Nurse Practitioner, Carroll Kinds, CMA, and patient.   Time spent on call:  8 minutes with medical assistant

## 2020-07-30 NOTE — Telephone Encounter (Signed)
[name] are scheduled for a virtual visit with your provider today.    Just as we do with appointments in the office, we must obtain your consent to participate.  Your consent will be active for this visit and any virtual visit you may have with one of our providers in the next 365 days.    If you have a MyChart account, I can also send a copy of this consent to you electronically.  All virtual visits are billed to your insurance company just like a traditional visit in the office.  As this is a virtual visit, video technology does not allow for your provider to perform a traditional examination.  This may limit your provider's ability to fully assess your condition.  If your provider identifies any concerns that need to be evaluated in person or the need to arrange testing such as labs, EKG, etc, we will make arrangements to do so.    Although advances in technology are sophisticated, we cannot ensure that it will always work on either your end or our end.  If the connection with a video visit is poor, we may have to switch to a telephone visit.  With either a video or telephone visit, we are not always able to ensure that we have a secure connection.   I need to obtain your verbal consent now.   Are you willing to proceed with your visit today?   Maria Adkins has provided verbal consent on 07/30/2020 for a virtual visit (video or telephone).   Carroll Kinds, CMA 07/30/2020  9:11 AM

## 2020-07-31 ENCOUNTER — Ambulatory Visit (INDEPENDENT_AMBULATORY_CARE_PROVIDER_SITE_OTHER): Payer: 59 | Admitting: Internal Medicine

## 2020-07-31 ENCOUNTER — Encounter: Payer: Self-pay | Admitting: Internal Medicine

## 2020-07-31 ENCOUNTER — Other Ambulatory Visit: Payer: Self-pay

## 2020-07-31 ENCOUNTER — Ambulatory Visit: Payer: 59 | Admitting: Physical Therapy

## 2020-07-31 VITALS — BP 130/76 | HR 69 | Temp 97.9°F | Wt 184.0 lb

## 2020-07-31 DIAGNOSIS — B20 Human immunodeficiency virus [HIV] disease: Secondary | ICD-10-CM

## 2020-07-31 DIAGNOSIS — Z0000 Encounter for general adult medical examination without abnormal findings: Secondary | ICD-10-CM | POA: Diagnosis not present

## 2020-07-31 DIAGNOSIS — I10 Essential (primary) hypertension: Secondary | ICD-10-CM

## 2020-07-31 DIAGNOSIS — Z79899 Other long term (current) drug therapy: Secondary | ICD-10-CM

## 2020-07-31 NOTE — Progress Notes (Signed)
[name], female   DOB: 02-12-1951, 69 y.o.   MRN: [id number]  HPI [place] is a 69 yo F with well controlled hiv disease,  "I feel great" She reports having moderna booster (but pfizer primary series) and also has had flu vaccine  Outpatient Encounter Medications as of 07/31/2020  Medication Sig  . albuterol (PROVENTIL) (2.5 MG/3ML) 0.083% nebulizer solution Take 3 mLs (2.5 mg total) by nebulization every 4 (four) hours as needed for wheezing or shortness of breath.  [name] albuterol (VENTOLIN HFA) 108 (90 Base) MCG/ACT inhaler INHALE 2 PUFFS BY MOUTH INTO THE LUNGS EVERY 4 HOURS AS NEEDED  . BIKTARVY 50-200-25 MG TABS tablet TAKE 1 TABLET BY MOUTH DAILY.  [name] Blood Pressure Monitoring (BLOOD PRESSURE CUFF) MISC 1 Device by Does not apply route daily. DX: I10  . Calcium Carbonate (CALCIUM 600 PO) Take 1 tablet by mouth daily.   . cetirizine (ZYRTEC) 10 MG tablet Take 1 tablet (10 mg total) by mouth daily.  . Dupilumab (DUPIXENT) 300 MG/2ML SOPN Inject 300 mg into the skin every 14 (fourteen) days.  . enalapril-hydrochlorothiazide (VASERETIC) 10-25 MG tablet TAKE 1 TABLET BY MOUTH EVERY DAY  . fluticasone (FLONASE) 50 MCG/ACT nasal spray Place 2 sprays into both nostrils daily.  . Fluticasone-Salmeterol (ADVAIR DISKUS) 500-50 MCG/DOSE AEPB Inhale 1 puff into the lungs 2 (two) times daily.  [name] ibuprofen (ADVIL) 200 MG tablet Take 400 mg by mouth at bedtime. For shoulder and knee pain  . metoprolol tartrate (LOPRESSOR) 100 MG tablet TAKE 1 TABLET BY MOUTH ONCE DAILY  . montelukast (SINGULAIR) 10 MG tablet Take 1 tablet (10 mg total) by mouth at bedtime.  . Multiple Vitamins-Minerals (MULTIVITAMIN GUMMIES ADULT PO) Take 2 tablets by mouth daily. Gummy  . Omega-3 Fatty Acids (FISH OIL) 1000 MG CAPS Take by mouth.  . pravastatin (PRAVACHOL) 20 MG tablet Take 1 tablet (20 mg total) by mouth daily.  . Saccharomyces boulardii (PROBIOTIC) 250 MG CAPS By mouth daily  . vitamin B-12  (CYANOCOBALAMIN) 500 MCG tablet Take 500 mcg by mouth daily.  [name] VITAMIN D PO Take 1 capsule by mouth daily.   No facility-administered encounter medications on file as of 07/31/2020.     Patient Active Problem List   Diagnosis Date Noted  . Asthma exacerbation 02/03/2020  . Acute exacerbation of asthma with allergic rhinitis 02/02/2020  . Seasonal allergies 01/13/2018  . Asthma 02/05/2017  . Allergy 12/16/2016  . 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 Essential hypertension 08/19/2016  . Human immunodeficiency virus (HIV) disease ([place]) 08/19/2016     There are no preventive care reminders to display for this patient.   Review of Systems Noticing some achiness to knees and shoulder- especially when cold weather. No swelling of joints. No weight loss. Physical Exam   BP 130/76   Pulse 69   Temp 97.9 F (36.6 C) (Oral)   Wt 184 lb (83.5 kg)   BMI 27.17 kg/m   Physical Exam  Constitutional:  oriented to person, place, and time. appears well-developed and well-nourished. No distress.  HENT: [place]/AT, PERRLA, no scleral icterus Mouth/Throat: Oropharynx is clear and moist. No oropharyngeal exudate.  Cardiovascular: Normal rate, regular rhythm and normal heart sounds. Exam reveals no gallop and no friction rub.  No murmur heard.  Pulmonary/Chest: Effort normal and breath sounds normal. No respiratory distress.  has no wheezes.  Neck = supple, no nuchal rigidity Abdominal: Soft. Bowel sounds are normal.  exhibits no distension. There is  no tenderness.  Lymphadenopathy: no cervical adenopathy. No axillary adenopathy Neurological: alert and oriented to person, place, and time.  Skin: Skin is warm and dry. No rash noted. No erythema.  Psychiatric: a normal mood and affect.  behavior is normal.   Lab Results  Component Value Date   CD4TCELL 23 (L) 07/17/2020   Lab Results  Component Value Date   CD4TABS 526 07/17/2020   CD4TABS 465 01/02/2020   CD4TABS 597 04/05/2019   Lab  Results  Component Value Date   HIV1RNAQUANT <20 07/17/2020   Lab Results  Component Value Date   HEPBSAB POS (A) 09/24/2016   Lab Results  Component Value Date   LABRPR NON-REACTIVE 07/17/2020    CBC Lab Results  Component Value Date   WBC 5.9 07/17/2020   RBC 4.43 07/17/2020   HGB 13.6 07/17/2020   HCT 40.4 07/17/2020   PLT 251 07/17/2020   MCV 91.2 07/17/2020   MCH 30.7 07/17/2020   MCHC 33.7 07/17/2020   RDW 12.8 07/17/2020   LYMPHSABS 2,331 07/17/2020   MONOABS 0.2 02/03/2020   EOSABS 218 07/17/2020    BMET Lab Results  Component Value Date   NA 141 07/17/2020   K 4.3 07/17/2020   CL 103 07/17/2020   CO2 33 (H) 07/17/2020   GLUCOSE 93 07/17/2020   BUN 15 07/17/2020   CREATININE 0.96 (H) 07/17/2020   CALCIUM 9.9 07/17/2020   GFRNONAA 60 07/17/2020   GFRAA 69 07/17/2020      Assessment and Plan  hiv disease= well controlled for hiv disease  Long term medication = cr is stable  Hypertension = blood pressure is well controlled. No change in regimen  Health maintenance = has bone density scan scheduled in jan 2022. Dental appt next appt. Next year she is due for colonoscopy (found benign polyp at her last colonoscopy roughly 5 yrs ago) vitamin b12, fish oil. MVI spaced from biktarvy

## 2020-08-01 ENCOUNTER — Other Ambulatory Visit: Payer: Self-pay | Admitting: Internal Medicine

## 2020-08-01 ENCOUNTER — Ambulatory Visit: Payer: 59

## 2020-08-01 ENCOUNTER — Ambulatory Visit (INDEPENDENT_AMBULATORY_CARE_PROVIDER_SITE_OTHER): Payer: 59 | Admitting: Internal Medicine

## 2020-08-01 ENCOUNTER — Encounter: Payer: Self-pay | Admitting: Internal Medicine

## 2020-08-01 VITALS — BP 112/62 | HR 72 | Temp 97.4°F | Ht 67.75 in | Wt 184.2 lb

## 2020-08-01 DIAGNOSIS — J4550 Severe persistent asthma, uncomplicated: Secondary | ICD-10-CM | POA: Diagnosis not present

## 2020-08-01 DIAGNOSIS — J301 Allergic rhinitis due to pollen: Secondary | ICD-10-CM

## 2020-08-01 DIAGNOSIS — D721 Eosinophilia, unspecified: Secondary | ICD-10-CM

## 2020-08-01 MED ORDER — DUPILUMAB 300 MG/2ML ~~LOC~~ SOSY
300.0000 mg | PREFILLED_SYRINGE | Freq: Once | SUBCUTANEOUS | Status: AC
  Administered 2020-08-01: 300 mg via SUBCUTANEOUS

## 2020-08-01 MED ORDER — FLUTICASONE-SALMETEROL 500-50 MCG/DOSE IN AEPB: 1.0000 | INHALATION_SPRAY | Freq: Two times a day (BID) | RESPIRATORY_TRACT | 11 refills | Status: DC

## 2020-08-01 MED FILL — BIKTARVY 50-200-25 MG TABS: 50-200-25 | 30 days supply | Qty: 30 | Fill #0

## 2020-08-01 MED FILL — ADVAIR 500/50 DISKUS: 500-50 | 30 days supply | Qty: 60 | Fill #0

## 2020-08-01 NOTE — Progress Notes (Signed)
Have you been hospitalized within the last 10 days?  No Do you have a fever?  No Do you have a cough?  No Do you have a headache or sore throat? No Do you have your Epi Pen visible and is it within date?  Yes   Discussed self administration of Dupixent.  Patient was instructed and shown injection with demo pen. Dupixent injection resources discussed. Patient stated she would start self administering Dupixent at next office injection appointment.

## 2020-08-01 NOTE — Progress Notes (Signed)
Maria Adkins    [id number]    Oct 31, 1950  [facility], Carlos American, NP Date of Appointment: 08/01/2020 Established Patient Visit  Chief complaint:   Chief Complaint  Patient presents with  . Follow-up    no current symptoms, advair for asthma use, no recent flare ups in last 3 months also on dupixent     HPI: Previously followed with Dr. Carlis Abbott. 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 On Dupilumab and advair.   Interval Updates: Here for follow up today. No exaerbations since last visit in September.  Dupilumab started 05/17/2020 for severe steroid dependent asthma. Much improved symptoms. No albuterol use. Hasn't been on prednisone since July 2021.   Current Regimen: high dose advair, singulair, dupilumab Asthma Triggers: stress, perfume, pollen, smoke, burnt foods.  Exacerbations in the last year: at least 3, has been steroid dependent until starting dupixent.  History of hospitalization or intubation: yes many times - last time in 2020. Required intubation during a hospital stay more than ten years ago. Previously very poorly controlled asthma Hives: none Allergy Testing: as a child.  GERD: none Allergic Rhinitis: yes on flonase, cetirizine ACT:  Asthma Control Test ACT Total Score  08/01/2020 25  05/22/2020 24  03/14/2020 9    I have reviewed the patient's family social and past medical history and updated as appropriate.   Past Medical History:  Diagnosis Date  . Asthma   . High blood pressure   . HIV (human immunodeficiency virus infection) ([place])   . Osteoarthritis     Past Surgical History:  Procedure Laterality Date  . Biopsy of Liver    . CATARACT EXTRACTION, BILATERAL    . CHOLECYSTECTOMY    . CYST REMOVAL HAND Left   . LAPAROSCOPIC SALPINGOOPHERECTOMY Right     Family History  Problem Relation Age of Onset  . Diabetes Mother        died at age 68  .  Hypertension Mother   . Asthma Sister   . Diabetes Sister   . Hypertension Sister   . Arthritis Sister   . Diabetes Sister   . Hypertension Sister   . Asthma Sister   . Arthritis Sister   . Asthma Son        controlled    Social History   Occupational History  . Not on file  Tobacco Use  . Smoking status: Former Smoker    Packs/day: 1.00    Years: 14.00    Pack years: 14.00    Types: Cigarettes    Quit date: 09/14/1978    Years since quitting: 41.9  . Smokeless tobacco: Never Used  Vaping Use  . Vaping Use: Never used  Substance and Sexual Activity  . Alcohol use: No  . Drug use: No  . Sexual activity: Yes    Partners: Male    Birth control/protection: Condom    Comment: declined condoms     Physical Exam: Blood pressure 112/62, pulse 72, temperature (!) 97.4 F (36.3 C), temperature source Temporal, height 5' 7.75" (1.721 m), weight 184 lb 3.2 oz (83.6 kg), SpO2 99 %.  Gen:      No acute distress ENT:  no nasal polyps, mucus membranes moist Lungs:    No increased respiratory effort, symmetric chest wall excursion, clear to auscultation bilaterally, no wheezes or crackles CV:         Regular rate and rhythm;  no murmurs, rubs, or gallops.  No pedal edema   Data Reviewed: Imaging: I have personally reviewed the chest xray June 2021 - mild hyperinflation.   PFTs:  PFT Results Latest Ref Rng & Units 04/22/2020  FVC-Pre L 2.48  FVC-Predicted Pre % 88  FVC-Post L 2.55  FVC-Predicted Post % 91  Pre FEV1/FVC % % 61  Post FEV1/FCV % % 71  FEV1-Pre L 1.52  FEV1-Predicted Pre % 69  FEV1-Post L 1.81  DLCO uncorrected ml/min/mmHg 19.68  DLCO UNC% % 88  DLCO corrected ml/min/mmHg 19.68  DLCO COR %Predicted % 88  DLVA Predicted % 106  TLC L 4.78  TLC % Predicted % 84  RV % Predicted % 93   I have personally reviewed the patient's PFTs and show mild airflow limitation with +BD response.   Labs: Elevated eosinophils  Noted on prior CBCs while on prednisone.    Immunization status: Immunization History  Administered Date(s) Administered  . Fluad Quad(high Dose 65+) 05/16/2019, 05/31/2020  . Influenza,inj,Quad PF,6+ Mos 06/17/2017, 06/09/2018  . Influenza-Unspecified 06/11/2016  . Moderna SARS-COVID-2 Vaccination 07/06/2020  . PFIZER SARS-COV-2 Vaccination 10/20/2019, 11/14/2019  . Pneumococcal Conjugate-13 05/20/2018  . Pneumococcal Polysaccharide-23 02/15/2017  . Tdap 09/21/2017  . Zoster Recombinat (Shingrix) 07/19/2018    Assessment:  Severe persistent eosinophilic asthma Allergic Rhinitis  Plan/Recommendations: Continue dupilumab injections and advair with prn albuterol. Will discuss in office injections with her and her son.  Continue treatment for allergic rhinitis  Return to Care: Return in about 3 months (around 11/01/2020).   [name], MD Pulmonary and [place]

## 2020-08-01 NOTE — Patient Instructions (Addendum)
The patient should have follow up scheduled with myself in 3 months.   Continue dupixent, and advair as well as the allergy medications.

## 2020-08-02 ENCOUNTER — Telehealth: Payer: Self-pay | Admitting: Internal Medicine

## 2020-08-02 NOTE — Telephone Encounter (Signed)
Yes, fine to transition to home administration. I discussed this with her at last visit.

## 2020-08-02 NOTE — Telephone Encounter (Signed)
Discussed Dupixent injection home/self administration.  Patient shown injection with demo pen.  Patient stated she was interested in trying self injections.  Patient stated her son was a MD, so if she had issues he could help her.  Message routed to Dr. Shearon Stalls and Pharmacy Team

## 2020-08-05 ENCOUNTER — Ambulatory Visit: Payer: 59 | Admitting: Physical Therapy

## 2020-08-06 ENCOUNTER — Ambulatory Visit: Payer: 59 | Admitting: Physical Therapy

## 2020-08-07 ENCOUNTER — Encounter: Payer: 59 | Admitting: Internal Medicine

## 2020-08-13 NOTE — Telephone Encounter (Signed)
Patient receives medication from [place] [employer]. Just needs new rx sent to [facility] for [place].

## 2020-08-14 MED ORDER — DUPIXENT 300 MG/2ML ~~LOC~~ SOAJ: 300.0000 mg | SUBCUTANEOUS | 5 refills | Status: DC

## 2020-08-14 NOTE — Addendum Note (Signed)
Addended by: Elton Sin on: 08/14/2020 03:05 PM   Modules accepted: Orders

## 2020-08-14 NOTE — Telephone Encounter (Signed)
New Dupixent pen prescription sent to [facility] for home injections.

## 2020-08-15 ENCOUNTER — Ambulatory Visit (INDEPENDENT_AMBULATORY_CARE_PROVIDER_SITE_OTHER): Payer: 59

## 2020-08-15 ENCOUNTER — Other Ambulatory Visit: Payer: Self-pay

## 2020-08-15 DIAGNOSIS — J4550 Severe persistent asthma, uncomplicated: Secondary | ICD-10-CM | POA: Diagnosis not present

## 2020-08-15 MED ORDER — DUPILUMAB 300 MG/2ML ~~LOC~~ SOSY
300.0000 mg | PREFILLED_SYRINGE | Freq: Once | SUBCUTANEOUS | Status: AC
  Administered 2020-08-15: 300 mg via SUBCUTANEOUS

## 2020-08-15 NOTE — Progress Notes (Signed)
Have you been hospitalized within the last 10 days?  No Do you have a fever?  No Do you have a cough?  No Do you have a headache or sore throat? No Do you have your Epi Pen visible and is it within date?  Yes    Patient transitioned to self injections.  Specialty pharmacy information, ordering, and delivery reviewed. Self injection sites and when do give injection reviewed. Patient demonstrated self injection with demo pen x's 3. Patient self administered injection in right lower abdomen.

## 2020-08-22 ENCOUNTER — Other Ambulatory Visit (HOSPITAL_BASED_OUTPATIENT_CLINIC_OR_DEPARTMENT_OTHER): Payer: Self-pay | Admitting: Dentist

## 2020-08-22 MED FILL — DENTA 5000 PLUS CREAM: 1.1 | 30 days supply | Qty: 51 | Fill #0

## 2020-08-27 MED FILL — BIKTARVY 50-200-25 MG TABS: 50-200-25 | 30 days supply | Qty: 30 | Fill #1

## 2020-08-30 MED FILL — MONTELUKAST SOD 10 MG TAB: 10 | 30 days supply | Qty: 30 | Fill #5

## 2020-09-20 ENCOUNTER — Ambulatory Visit
Admission: RE | Admit: 2020-09-20 | Discharge: 2020-09-20 | Disposition: A | Discharge status: Home / Self Care | Payer: 59 | Source: Ambulatory Visit | Attending: Nurse Practitioner | Admitting: Nurse Practitioner

## 2020-09-20 ENCOUNTER — Other Ambulatory Visit: Payer: Self-pay

## 2020-09-20 DIAGNOSIS — Z780 Asymptomatic menopausal state: Secondary | ICD-10-CM | POA: Diagnosis not present

## 2020-09-20 DIAGNOSIS — M8580 Other specified disorders of bone density and structure, unspecified site: Secondary | ICD-10-CM

## 2020-09-20 DIAGNOSIS — E2839 Other primary ovarian failure: Secondary | ICD-10-CM

## 2020-09-20 DIAGNOSIS — M8589 Other specified disorders of bone density and structure, multiple sites: Secondary | ICD-10-CM | POA: Diagnosis not present

## 2020-09-25 ENCOUNTER — Telehealth: Payer: Self-pay | Admitting: Nurse Practitioner

## 2020-09-25 ENCOUNTER — Other Ambulatory Visit: Payer: Self-pay

## 2020-09-25 ENCOUNTER — Encounter: Payer: Self-pay | Admitting: Nurse Practitioner

## 2020-09-25 ENCOUNTER — Ambulatory Visit (INDEPENDENT_AMBULATORY_CARE_PROVIDER_SITE_OTHER): Payer: 59 | Admitting: Nurse Practitioner

## 2020-09-25 ENCOUNTER — Other Ambulatory Visit: Payer: Self-pay | Admitting: Nurse Practitioner

## 2020-09-25 VITALS — BP 136/82 | HR 61 | Temp 97.1°F | Ht 68.0 in | Wt 187.2 lb

## 2020-09-25 DIAGNOSIS — I10 Essential (primary) hypertension: Secondary | ICD-10-CM

## 2020-09-25 DIAGNOSIS — F321 Major depressive disorder, single episode, moderate: Secondary | ICD-10-CM

## 2020-09-25 DIAGNOSIS — M159 Polyosteoarthritis, unspecified: Secondary | ICD-10-CM

## 2020-09-25 DIAGNOSIS — Z1211 Encounter for screening for malignant neoplasm of colon: Secondary | ICD-10-CM

## 2020-09-25 DIAGNOSIS — K5904 Chronic idiopathic constipation: Secondary | ICD-10-CM | POA: Diagnosis not present

## 2020-09-25 DIAGNOSIS — K635 Polyp of colon: Secondary | ICD-10-CM | POA: Diagnosis not present

## 2020-09-25 DIAGNOSIS — M8949 Other hypertrophic osteoarthropathy, multiple sites: Secondary | ICD-10-CM

## 2020-09-25 DIAGNOSIS — J4550 Severe persistent asthma, uncomplicated: Secondary | ICD-10-CM | POA: Diagnosis not present

## 2020-09-25 DIAGNOSIS — Z1212 Encounter for screening for malignant neoplasm of rectum: Secondary | ICD-10-CM | POA: Diagnosis not present

## 2020-09-25 MED ORDER — LINACLOTIDE 145 MCG PO CAPS: 145.0000 ug | ORAL_CAPSULE | Freq: Every day | ORAL | 2 refills | Status: DC

## 2020-09-25 MED ORDER — LUBIPROSTONE 24 MCG PO CAPS: 24.0000 ug | ORAL_CAPSULE | Freq: Two times a day (BID) | ORAL | 1 refills | Status: DC

## 2020-09-25 MED ORDER — SERTRALINE HCL 25 MG PO TABS: 25.0000 mg | ORAL_TABLET | Freq: Every day | ORAL | 1 refills | Status: DC

## 2020-09-25 MED FILL — LUBIPROSTONE 24 MCG CAPS: 24 | 30 days supply | Qty: 60 | Fill #0

## 2020-09-25 MED FILL — SERTRALINE HCL 25 MG TABLET: 25 | 30 days supply | Qty: 30 | Fill #0

## 2020-09-25 NOTE — Telephone Encounter (Signed)
Amitiza not covered under pt insurance coverage but reports linzess does have coverage. Will remove amitiza from medication list and add linzess 145 mcg by mouth daily- to take 30 mins before 1st meal of the day. Please call pt and notify

## 2020-09-25 NOTE — Progress Notes (Signed)
Careteam: Patient Care Team: Lauree Chandler, NP as PCP - General (Geriatric Medicine)  PLACE OF SERVICE:  [place] Directive information Does Patient Have a Medical Advance Directive?: No, Does patient want to make changes to medical advance directive?: Yes (MAU/Ambulatory/Procedural Areas - Information given)  No Known Allergies  Chief Complaint  Patient presents with  . Medical Management of Chronic Issues    4 month follow-up and discuss colonoscopy care gap.      HPI: Patient is a 70 y.o. female routine follow up. At last visit had increase pain to bilateral shoulders and knees. Did not go to sports medicine but went to PT and this has improved pain.  Having constipation- has done metamucil and benefiber  Tried miralax 17 gm but that did not help. Using magnesium citrate which is helping.  Eats fruits, vegetables, increase exercise and drinks water.   Asthma- controlled.   Has gained weight and not exercising. Plans to get back to this.   HIV- followed by ID, appt with labs next month.   Reports depression, feels like she could benefit from medication. No SI or HI. Does not want to do anything. Feels like her depression is moderate. Reports she was depressed years and years ago. Unsure what medication she took.   Review of Systems:  Review of Systems  Constitutional: Negative for chills, fever and weight loss.  HENT: Negative for tinnitus.   Respiratory: Negative for cough, sputum production and shortness of breath.   Cardiovascular: Negative for chest pain, palpitations and leg swelling.  Gastrointestinal: Positive for constipation. Negative for abdominal pain, diarrhea and heartburn.  Genitourinary: Negative for dysuria, frequency and urgency.  Musculoskeletal: Negative for back pain, falls, joint pain and myalgias.  Skin: Negative.   Neurological: Negative for dizziness and headaches.  Psychiatric/Behavioral: Positive for depression. Negative  for memory loss. The patient does not have insomnia.     Past Medical History:  Diagnosis Date  . Asthma   . High blood pressure   . HIV (human immunodeficiency virus infection) ([place])   . Osteoarthritis    Past Surgical History:  Procedure Laterality Date  . Biopsy of Liver    . CATARACT EXTRACTION, BILATERAL    . CHOLECYSTECTOMY    . CYST REMOVAL HAND Left   . LAPAROSCOPIC SALPINGOOPHERECTOMY Right    Social History:   reports that she quit smoking about 42 years ago. Her smoking use included cigarettes. She has a 14.00 pack-year smoking history. She has never used smokeless tobacco. She reports that she does not drink alcohol and does not use drugs.  Family History  Problem Relation Age of Onset  . Diabetes Mother        died at age 62  . Hypertension Mother   . Asthma Sister   . Diabetes Sister   . Hypertension Sister   . Arthritis Sister   . Diabetes Sister   . Hypertension Sister   . Asthma Sister   . Arthritis Sister   . Heart defect Sister   . Asthma Son        controlled  . Crohn's disease Niece     Medications: Patient's Medications  New Prescriptions   No medications on file  Previous Medications   ALBUTEROL (PROVENTIL) (2.5 MG/3ML) 0.083% NEBULIZER SOLUTION    Take 3 mLs (2.5 mg total) by nebulization every 4 (four) hours as needed for wheezing or shortness of breath.   ALBUTEROL (VENTOLIN HFA) 108 (90 BASE) MCG/ACT INHALER  INHALE 2 PUFFS BY MOUTH INTO THE LUNGS EVERY 4 HOURS AS NEEDED   BIKTARVY 50-200-25 MG TABS TABLET    TAKE 1 TABLET BY MOUTH DAILY.   BLOOD PRESSURE MONITORING (BLOOD PRESSURE CUFF) MISC    1 Device by Does not apply route daily. DX: I10   CALCIUM CARBONATE (CALCIUM 600 PO)    Take 1 tablet by mouth daily.    CETIRIZINE (ZYRTEC) 10 MG TABLET    Take 1 tablet (10 mg total) by mouth daily.   DUPILUMAB (DUPIXENT) 300 MG/2ML SOPN    Inject 300 mg into the skin every 14 (fourteen) days.   ENALAPRIL-HYDROCHLOROTHIAZIDE (VASERETIC) 10-25  MG TABLET    TAKE 1 TABLET BY MOUTH EVERY DAY   FLUTICASONE (FLONASE) 50 MCG/ACT NASAL SPRAY    Place 2 sprays into both nostrils daily.   FLUTICASONE-SALMETEROL (ADVAIR DISKUS) 500-50 MCG/DOSE AEPB    Inhale 1 puff into the lungs 2 (two) times daily.   IBUPROFEN (ADVIL) 200 MG TABLET    Take 400 mg by mouth at bedtime. For shoulder and knee pain   METOPROLOL TARTRATE (LOPRESSOR) 100 MG TABLET    TAKE 1 TABLET BY MOUTH ONCE DAILY   MONTELUKAST (SINGULAIR) 10 MG TABLET    Take 1 tablet (10 mg total) by mouth at bedtime.   MULTIPLE VITAMINS-MINERALS (MULTIVITAMIN GUMMIES ADULT PO)    Take 2 tablets by mouth daily. Gummy   OMEGA-3 FATTY ACIDS (FISH OIL) 1000 MG CAPS    Take by mouth.   PRAVASTATIN (PRAVACHOL) 20 MG TABLET    Take 1 tablet (20 mg total) by mouth daily.   SACCHAROMYCES BOULARDII (PROBIOTIC) 250 MG CAPS    By mouth daily   VITAMIN B-12 (CYANOCOBALAMIN) 500 MCG TABLET    Take 500 mcg by mouth daily.   VITAMIN D PO    Take 1 capsule by mouth daily.  Modified Medications   No medications on file  Discontinued Medications   No medications on file    Physical Exam:  Vitals:   09/25/20 0829  BP: 136/82  Pulse: 61  Temp: (!) 97.1 F (36.2 C)  TempSrc: Temporal  SpO2: 97%  Weight: 187 lb 3.2 oz (84.9 kg)  Height: 5\' 8"  (1.727 m)   Body mass index is 28.46 kg/m. Wt Readings from Last 3 Encounters:  09/25/20 187 lb 3.2 oz (84.9 kg)  08/01/20 184 lb 3.2 oz (83.6 kg)  07/31/20 184 lb (83.5 kg)    Physical Exam Constitutional:      General: She is not in acute distress.    Appearance: She is well-developed and well-nourished. She is not diaphoretic.  HENT:     Head: Normocephalic and atraumatic.     Mouth/Throat:     Mouth: Oropharynx is clear and moist.     Pharynx: No oropharyngeal exudate.  Eyes:     Conjunctiva/sclera: Conjunctivae normal.     Pupils: Pupils are equal, round, and reactive to light.  Cardiovascular:     Rate and Rhythm: Normal rate and regular  rhythm.     Heart sounds: Normal heart sounds.  Pulmonary:     Effort: Pulmonary effort is normal.     Breath sounds: Normal breath sounds.  Abdominal:     General: Bowel sounds are normal.     Palpations: Abdomen is soft.  Musculoskeletal:        General: No tenderness or edema.     Cervical back: Normal range of motion and neck supple.  Skin:  General: Skin is warm and dry.  Neurological:     Mental Status: She is alert and oriented to person, place, and time.  Psychiatric:        Mood and Affect: Mood and affect normal.      Labs reviewed: Basic Metabolic Panel: Recent Labs    02/03/20 0511 02/04/20 0720 07/17/20 0829  NA 141 138 141  K 2.9* 4.6 4.3  CL 101 102 103  CO2 24 29 33*  GLUCOSE 195* 148* 93  BUN 17 15 15   CREATININE 1.21* 0.91 0.96*  CALCIUM 9.4 9.5 9.9  MG 2.3 2.4  --    Liver Function Tests: Recent Labs    01/08/20 0842 02/03/20 0511 07/17/20 0829  AST 18 31 24   ALT 25 27 19   ALKPHOS  --  46  --   BILITOT 1.0 0.6 0.8  PROT 6.2 7.5 6.3  ALBUMIN  --  4.2  --    No results for input(s): LIPASE, AMYLASE in the last 8760 hours. No results for input(s): AMMONIA in the last 8760 hours. CBC: Recent Labs    01/08/20 0842 02/02/20 2204 02/03/20 0511 07/17/20 0829  WBC 6.4 8.8 10.1 5.9  NEUTROABS 2,317  --  8.0* 2,673  HGB 13.5 14.8 15.2* 13.6  HCT 41.1 43.2 46.0 40.4  MCV 92.2 92.1 95.6 91.2  PLT 251 274 295 251   Lipid Panel: Recent Labs    01/02/20 1049 01/08/20 0842  CHOL 155 141  HDL 44* 44*  LDLCALC 83 78  TRIG 186* 102  CHOLHDL 3.5 3.2   TSH: No results for input(s): TSH in the last 8760 hours. A1C: No results found for: HGBA1C   Assessment/Plan 1. Depression, major, single episode, moderate ([place]) Reports depression at this time. Encouraged to get back into exercise.  Get outside if possible -gave handout for 3 good things app- to do daily -to start zoloft at this time -information provided for counselors as well.    - sertraline (ZOLOFT) 25 MG tablet; Take 1 tablet (25 mg total) by mouth daily.  Dispense: 30 tablet; Refill: 1  2. Chronic idiopathic constipation Ongoing despite trying multiple OTC (miralax, fiber, stool softeners)  - lubiprostone (AMITIZA) 24 MCG capsule; Take 1 capsule (24 mcg total) by mouth 2 (two) times daily with a meal.  Dispense: 60 capsule; Refill: 1  3. Encounter for colorectal cancer screening - Ambulatory referral to Gastroenterology  4. Polyp of colon, unspecified part of colon, unspecified type -due for 5 year follow up - Ambulatory referral to Gastroenterology  5. Primary osteoarthritis involving multiple joints Stable at this time.   6. Severe persistent asthma, unspecified whether complicated Doing well on current regimen, no recent flares.   7. Essential hypertension Controlled on lopressor and dietary modifications.    Next appt: 4 weeks.  Carlos American. [place], [facility] [phone]

## 2020-09-25 NOTE — Telephone Encounter (Signed)
Maria Adkins called and said she got her 2nd shingles shot at the [facility], the same place she gets her prescriptions. Thank you!!

## 2020-09-25 NOTE — Telephone Encounter (Signed)
I will check [facility] to get an approximate date. We can not document immunizations without an approximate date, at the minimum we need a month and year.  Calls such as this should be forwarded to Clinical Intake in the future as they know which pertinent questions to ask   There was no documentation of additional vaccines other that what is already recorded in patients record

## 2020-09-25 NOTE — Telephone Encounter (Signed)
Called patient and she stated that she picked up the amitiza. She stated that her secondary insurance covered medication and she only had to pay $45.00. So she did not get the linzess, but the amitiza.

## 2020-09-25 NOTE — Patient Instructions (Addendum)
To start zoloft 25 mg by daily for depression.  To to 3 good things app daily To get outside daily if possible.  To exercise 30 mins/5 days a week -- you tube, say yes to next - can subscribe Walks, silver sneakers videos online. For exercise.      To start amitiza for constipation- to take with food twice daily Hold all other medications for constipation at this time   Follow up in 1 month on mood and constipation

## 2020-09-26 ENCOUNTER — Telehealth: Payer: Self-pay

## 2020-09-26 MED FILL — BIKTARVY 50-200-25 MG TABS: 50-200-25 | 30 days supply | Qty: 30 | Fill #2

## 2020-09-26 NOTE — Telephone Encounter (Signed)
[facility] Patient Advocate Encounter   I was successful in securing patient a $7500 grant from [employer] [place] (PAF) to provide copayment coverage for Biktarvy.  This will make the out of pocket cost $0.00.     I have spoken with the patient.    The billing information is as follows and has been shared with [place].            Dates of Eligibility: 09/26/20 through 09/26/21  Patient knows to call the office with questions or concerns.  Ileene Patrick, [facility] Patient [facility] Phone: [phone] Fax:  [phone]

## 2020-09-26 NOTE — Telephone Encounter (Signed)
Okay if the amitiza is helpful for her we can continue her on that medication since her secondary covered.

## 2020-10-02 ENCOUNTER — Encounter: Payer: Self-pay | Admitting: Gastroenterology

## 2020-10-09 ENCOUNTER — Other Ambulatory Visit: Payer: Self-pay | Admitting: Nurse Practitioner

## 2020-10-09 MED FILL — MONTELUKAST SOD 10 MG TAB: 10 | 30 days supply | Qty: 30 | Fill #6

## 2020-10-09 MED FILL — PRAVASTATIN NA 20 MG TAB: 20 | 90 days supply | Qty: 90 | Fill #0

## 2020-10-24 ENCOUNTER — Telehealth: Payer: Self-pay | Admitting: Internal Medicine

## 2020-10-24 DIAGNOSIS — Z86010 Personal history of colonic polyps: Secondary | ICD-10-CM

## 2020-10-24 MED FILL — SERTRALINE HCL 25 MG TABLET: 25 | 30 days supply | Qty: 30 | Fill #1

## 2020-10-24 NOTE — Telephone Encounter (Signed)
Hi Dr. Carlean Purl,   We received a referral from PCP for patient to have another colonoscopy. Report in Epic states she had one done in 2015.   Please review and advise on scheduling.   Thank you

## 2020-10-25 ENCOUNTER — Encounter: Payer: Self-pay | Admitting: Internal Medicine

## 2020-10-25 MED FILL — ADVAIR 500/50 DISKUS: 500-50 | 30 days supply | Qty: 60 | Fill #1

## 2020-10-25 NOTE — Telephone Encounter (Signed)
Hx adenoma  Ok to arrange colonoscopy direct

## 2020-10-25 NOTE — Telephone Encounter (Signed)
Done

## 2020-10-28 ENCOUNTER — Other Ambulatory Visit: Payer: Self-pay | Admitting: Nurse Practitioner

## 2020-10-28 ENCOUNTER — Ambulatory Visit (INDEPENDENT_AMBULATORY_CARE_PROVIDER_SITE_OTHER): Payer: 59 | Admitting: Nurse Practitioner

## 2020-10-28 ENCOUNTER — Encounter: Payer: Self-pay | Admitting: Nurse Practitioner

## 2020-10-28 ENCOUNTER — Other Ambulatory Visit: Payer: Self-pay

## 2020-10-28 VITALS — BP 118/80 | HR 59 | Temp 96.9°F | Ht 68.0 in | Wt 188.0 lb

## 2020-10-28 DIAGNOSIS — I10 Essential (primary) hypertension: Secondary | ICD-10-CM

## 2020-10-28 DIAGNOSIS — F321 Major depressive disorder, single episode, moderate: Secondary | ICD-10-CM | POA: Diagnosis not present

## 2020-10-28 DIAGNOSIS — K5904 Chronic idiopathic constipation: Secondary | ICD-10-CM

## 2020-10-28 LAB — BASIC METABOLIC PANEL WITH GFR
BUN/Creatinine Ratio: 13 (calc) (ref 6–22)
BUN: 12 mg/dL (ref 7–25)
CO2: 33 mmol/L — ABNORMAL HIGH (ref 20–32)
Calcium: 9.8 mg/dL (ref 8.6–10.4)
Chloride: 104 mmol/L (ref 98–110)
Creat: 0.96 mg/dL — ABNORMAL HIGH (ref 0.60–0.93)
GFR, Est African American: 69 mL/min/{1.73_m2} (ref >=60)
GFR, Est Non African American: 60 mL/min/{1.73_m2} (ref >=60)
Glucose, Bld: 78 mg/dL (ref 65–139)
Potassium: 3.8 mmol/L (ref 3.5–5.3)
Sodium: 143 mmol/L (ref 135–146)

## 2020-10-28 MED ORDER — SERTRALINE HCL 25 MG PO TABS: 25.0000 mg | ORAL_TABLET | Freq: Every day | ORAL | 2 refills | Status: DC

## 2020-10-28 MED ORDER — LUBIPROSTONE 24 MCG PO CAPS
24.0000 ug | ORAL_CAPSULE | Freq: Two times a day (BID) | ORAL | 1 refills | Status: DC
Start: 2020-10-28 — End: 2020-10-28

## 2020-10-28 MED FILL — SERTRALINE HCL 25 MG TABLET: 25 | 90 days supply | Qty: 90 | Fill #0

## 2020-10-28 MED FILL — ENALAPRIL-HCTZ 10-25 MG TAB: 10-25 | 90 days supply | Qty: 90 | Fill #0

## 2020-10-28 MED FILL — LUBIPROSTONE 24 MCG CAPS: 24 | 30 days supply | Qty: 60 | Fill #0

## 2020-10-28 NOTE — Progress Notes (Signed)
Careteam: Patient Care Team: Lauree Chandler, NP as PCP - General (Geriatric Medicine)  PLACE OF SERVICE:  [place] Direct[facility] information    No Known Allergies  Chief Complaint  Patient presents with  . Follow-up    1 month follow-up. Discuss colonoscopy recommendation, per patient appt pending for December 16, 2020. FYI, patient gave up white sugar.      HPI: Patient is a 70 y.o. female to follow up on depression and constipation At last visit she was started on zoloft for depression. She is doing much better on medication. Has started back in the gym, in spiritual classes. No thoughts of SI or Hi.  Does not feel like crying all the time. Able to get out of the bed.   amitiza was started due to ongoing constipation despite multiple OTC. Working very well. Sometimes as loose stools with medication but overall working very well and regularly. Does not feel tight and bloated.   Review of Systems:  Review of Systems  Constitutional: Negative for chills, fever and weight loss.  HENT: Negative for tinnitus.   Respiratory: Negative for cough, sputum production and shortness of breath.   Cardiovascular: Negative for chest pain, palpitations and leg swelling.  Gastrointestinal: Negative for constipation and diarrhea.  Musculoskeletal: Negative for back pain, falls, joint pain and myalgias.  Skin: Negative.   Neurological: Negative for dizziness and headaches.  Psychiatric/Behavioral: Negative for depression and memory loss. The patient does not have insomnia.     Past Medical History:  Diagnosis Date  . Asthma   . High blood pressure   . HIV (human immunodeficiency virus infection) ([place])   . Hx of adenomatous polyp of colon 2015  . Osteoarthritis    Past Surgical History:  Procedure Laterality Date  . Biopsy of Liver    . CATARACT EXTRACTION, BILATERAL    . CHOLECYSTECTOMY    . COLONOSCOPY    . CYST REMOVAL HAND Left   . LAPAROSCOPIC SALPINGOOPHERECTOMY  Right    Social History:   reports that she quit smoking about 42 years ago. Her smoking use included cigarettes. She has a 14.00 pack-year smoking history. She has never used smokeless tobacco. She reports that she does not drink alcohol and does not use drugs.  Family History  Problem Relation Age of Onset  . Diabetes Mother        died at age 60  . Hypertension Mother   . Asthma Sister   . Diabetes Sister   . Hypertension Sister   . Arthritis Sister   . Diabetes Sister   . Hypertension Sister   . Asthma Sister   . Arthritis Sister   . Heart defect Sister   . Asthma Son        controlled  . Crohn's disease Niece     Medications: Patient's Medications  New Prescriptions   No medications on file  Previous Medications   ALBUTEROL (PROVENTIL) (2.5 MG/3ML) 0.083% NEBULIZER SOLUTION    Take 3 mLs (2.5 mg total) by nebulization every 4 (four) hours as needed for wheezing or shortness of breath.   ALBUTEROL (VENTOLIN HFA) 108 (90 BASE) MCG/ACT INHALER    INHALE 2 PUFFS BY MOUTH INTO THE LUNGS EVERY 4 HOURS AS NEEDED   BIKTARVY 50-200-25 MG TABS TABLET    TAKE 1 TABLET BY MOUTH DAILY.   BLOOD PRESSURE MONITORING (BLOOD PRESSURE CUFF) MISC    1 Device by Does not apply route daily. DX: N17  CALCIUM CARBONATE (CALCIUM 600 PO)    Take 1 tablet by mouth daily.    CETIRIZINE (ZYRTEC) 10 MG TABLET    Take 1 tablet (10 mg total) by mouth daily.   DUPILUMAB (DUPIXENT) 300 MG/2ML SOPN    Inject 300 mg into the skin every 14 (fourteen) days.   ENALAPRIL-HYDROCHLOROTHIAZIDE (VASERETIC) 10-25 MG TABLET    TAKE 1 TABLET BY MOUTH ONCE DAILY   FLUTICASONE (FLONASE) 50 MCG/ACT NASAL SPRAY    Place 2 sprays into both nostrils daily.   FLUTICASONE-SALMETEROL (ADVAIR DISKUS) 500-50 MCG/DOSE AEPB    Inhale 1 puff into the lungs 2 (two) times daily.   LINACLOTIDE (LINZESS) 145 MCG CAPS CAPSULE    Take 1 capsule (145 mcg total) by mouth daily before breakfast.   METOPROLOL TARTRATE (LOPRESSOR) 100 MG  TABLET    TAKE 1 TABLET BY MOUTH ONCE DAILY   MONTELUKAST (SINGULAIR) 10 MG TABLET    Take 1 tablet (10 mg total) by mouth at bedtime.   MULTIPLE VITAMINS-MINERALS (MULTIVITAMIN GUMMIES ADULT PO)    Take 2 tablets by mouth daily. Gummy   OMEGA-3 FATTY ACIDS (FISH OIL) 1000 MG CAPS    Take by mouth.   PRAVASTATIN (PRAVACHOL) 20 MG TABLET    TAKE 1 TABLET BY MOUTH ONCE DAILY   SACCHAROMYCES BOULARDII (PROBIOTIC) 250 MG CAPS    By mouth daily   SERTRALINE (ZOLOFT) 25 MG TABLET    Take 1 tablet (25 mg total) by mouth daily.   VITAMIN B-12 (CYANOCOBALAMIN) 500 MCG TABLET    Take 500 mcg by mouth daily.   VITAMIN D PO    Take 1 capsule by mouth daily.  Modified Medications   No medications on file  Discontinued Medications   No medications on file    Physical Exam:  Vitals:   10/28/20 0904  BP: 118/80  Pulse: (!) 59  Temp: (!) 96.9 F (36.1 C)  TempSrc: Temporal  SpO2: 97%  Weight: 188 lb (85.3 kg)  Height: 5\' 8"  (1.727 m)   Body mass index is 28.59 kg/m. Wt Readings from Last 3 Encounters:  10/28/20 188 lb (85.3 kg)  09/25/20 187 lb 3.2 oz (84.9 kg)  08/01/20 184 lb 3.2 oz (83.6 kg)    Physical Exam Constitutional:      General: She is not in acute distress.    Appearance: She is well-developed and well-nourished. She is not diaphoretic.  HENT:     Head: Normocephalic and atraumatic.     Mouth/Throat:     Mouth: Oropharynx is clear and moist.     Pharynx: No oropharyngeal exudate.  Eyes:     Conjunctiva/sclera: Conjunctivae normal.     Pupils: Pupils are equal, round, and reactive to light.  Cardiovascular:     Rate and Rhythm: Normal rate and regular rhythm.     Heart sounds: Normal heart sounds.  Pulmonary:     Effort: Pulmonary effort is normal.     Breath sounds: Normal breath sounds.  Abdominal:     General: Bowel sounds are normal.     Palpations: Abdomen is soft.  Musculoskeletal:        General: No tenderness or edema.     Cervical back: Normal range of  motion and neck supple.  Skin:    General: Skin is warm and dry.  Neurological:     Mental Status: She is alert and oriented to person, place, and time.  Psychiatric:        Mood and Affect:  Mood and affect and mood normal.        Behavior: Behavior normal.     Labs reviewed: Basic Metabolic Panel: Recent Labs    02/03/20 0511 02/04/20 0720 07/17/20 0829  NA 141 138 141  K 2.9* 4.6 4.3  CL 101 102 103  CO2 24 29 33*  GLUCOSE 195* 148* 93  BUN 17 15 15   CREATININE 1.21* 0.91 0.96*  CALCIUM 9.4 9.5 9.9  MG 2.3 2.4  --    Liver Function Tests: Recent Labs    01/08/20 0842 02/03/20 0511 07/17/20 0829  AST 18 31 24   ALT 25 27 19   ALKPHOS  --  46  --   BILITOT 1.0 0.6 0.8  PROT 6.2 7.5 6.3  ALBUMIN  --  4.2  --    No results for input(s): LIPASE, AMYLASE in the last 8760 hours. No results for input(s): AMMONIA in the last 8760 hours. CBC: Recent Labs    01/08/20 0842 02/02/20 2204 02/03/20 0511 07/17/20 0829  WBC 6.4 8.8 10.1 5.9  NEUTROABS 2,317  --  8.0* 2,673  HGB 13.5 14.8 15.2* 13.6  HCT 41.1 43.2 46.0 40.4  MCV 92.2 92.1 95.6 91.2  PLT 251 274 295 251   Lipid Panel: Recent Labs    01/02/20 1049 01/08/20 0842  CHOL 155 141  HDL 44* 44*  LDLCALC 83 78  TRIG 186* 102  CHOLHDL 3.5 3.2   TSH: No results for input(s): TSH in the last 8760 hours. A1C: No results found for: HGBA1C   Assessment/Plan 1. Depression, major, single episode, moderate ([place]) -improved on zoloft, also involved in exercise and small groups which are helping.  No side effects noted.  - sertraline (ZOLOFT) 25 MG tablet; Take 1 tablet (25 mg total) by mouth daily.  Dispense: 90 tablet; Refill: 2 - BASIC METABOLIC PANEL WITH GFR  2. Chronic idiopathic constipation Doing well on amitiza. Will continue at this time.  - lubiprostone (AMITIZA) 24 MCG capsule; Take 1 capsule (24 mcg total) by mouth 2 (two) times daily with a meal.  Dispense: 180 capsule; Refill: 1  Next appt:  5 months for routine follow up.  Carlos American. [place], [facility] [phone]

## 2020-10-30 ENCOUNTER — Ambulatory Visit: Payer: 59 | Admitting: Internal Medicine

## 2020-10-30 MED FILL — BIKTARVY 50-200-25 MG TABS: 50-200-25 | 30 days supply | Qty: 30 | Fill #3

## 2020-11-04 ENCOUNTER — Other Ambulatory Visit: Payer: Self-pay | Admitting: Nurse Practitioner

## 2020-11-04 MED FILL — METOPROLOL TARTRATE 100 MG: 100 | 90 days supply | Qty: 90 | Fill #0

## 2020-11-07 ENCOUNTER — Other Ambulatory Visit: Payer: Self-pay

## 2020-11-07 ENCOUNTER — Ambulatory Visit (INDEPENDENT_AMBULATORY_CARE_PROVIDER_SITE_OTHER): Payer: 59 | Admitting: Internal Medicine

## 2020-11-07 ENCOUNTER — Encounter: Payer: Self-pay | Admitting: Internal Medicine

## 2020-11-07 VITALS — BP 124/76 | HR 69 | Temp 98.4°F | Ht 68.0 in | Wt 188.1 lb

## 2020-11-07 DIAGNOSIS — J4550 Severe persistent asthma, uncomplicated: Secondary | ICD-10-CM | POA: Diagnosis not present

## 2020-11-07 DIAGNOSIS — J301 Allergic rhinitis due to pollen: Secondary | ICD-10-CM | POA: Diagnosis not present

## 2020-11-07 NOTE — Progress Notes (Signed)
Maria Adkins    [id number]    Dec 18, 1950  Primary Care Physician:Eubanks, Carlos American, NP Date of Appointment: 11/07/2020 Established Patient Visit  Chief complaint:   Chief Complaint  Patient presents with  . Follow-up    3 month f/u for asthma. States she has been doing well since last visit. Still doing the Dupixent at home.      HPI: Maria Adkins is a 70 y.o. 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 On Dupilumab started 06/06/2020 for severe steroid dependent asthma.   Interval Updates: No prednisone use since July 2021. No albuterol use. Doing very well with home injections, no skin reactions. She is rotating sites for injection primarily in her stomach.   Current Regimen: high dose advair, singulair, flonase, cetirizine, dupilumab Asthma Triggers: stress, perfume, pollen, smoke, burnt foods.  Exacerbations in the last year: none since July 2021 - since starting dupilimab, prior to that may times.  History of hospitalization or intubation: yes many times - last time in 2020. Required intubation during a hospital stay more than ten years ago. Previously very poorly controlled asthma Hives: none Allergy Testing: as a child.  GERD: none Allergic Rhinitis: yes on flonase, cetirizine ACT:  Asthma Control Test ACT Total Score  11/07/2020 25  08/01/2020 25  05/22/2020 24   Lives at home with husband and son's family including grandson. No pets at home. No passive smoke exposure.    I have reviewed the patient's family social and past medical history and updated as appropriate.   Past Medical History:  Diagnosis Date  . Asthma   . High blood pressure   . HIV (human immunodeficiency virus infection) ([place])   . Hx of adenomatous polyp of colon 2015  . Osteoarthritis     Past Surgical History:  Procedure Laterality Date  . Biopsy of Liver    . CATARACT EXTRACTION, BILATERAL    . CHOLECYSTECTOMY    . COLONOSCOPY    . CYST  REMOVAL HAND Left   . LAPAROSCOPIC SALPINGOOPHERECTOMY Right     Family History  Problem Relation Age of Onset  . Diabetes Mother        died at age 53  . Hypertension Mother   . Asthma Sister   . Diabetes Sister   . Hypertension Sister   . Arthritis Sister   . Diabetes Sister   . Hypertension Sister   . Asthma Sister   . Arthritis Sister   . Heart defect Sister   . Asthma Son        controlled  . Crohn's disease Niece     Social History   Occupational History  . Not on file  Tobacco Use  . Smoking status: Former Smoker    Packs/day: 1.00    Years: 14.00    Pack years: 14.00    Types: Cigarettes    Quit date: 09/14/1978    Years since quitting: 42.1  . Smokeless tobacco: Never Used  Vaping Use  . Vaping Use: Never used  Substance and Sexual Activity  . Alcohol use: No  . Drug use: No  . Sexual activity: Yes    Partners: Male    Birth control/protection: Condom    Comment: declined condoms     Physical Exam: Blood pressure 124/76, pulse 69, temperature 98.4 F (36.9 C), temperature source Temporal, height 5\' 8"  (1.727 m), weight 188 lb 1.6 oz (85.3 kg), SpO2 98 %.  Gen:  No distress Lungs:    Normal RR, no wheezes or crackles CV:         RRR, no mrg, no edema   Data Reviewed: Imaging: I have personally reviewed the chest xray June 2021 - mild hyperinflation.   PFTs:  PFT Results Latest Ref Rng & Units 04/22/2020  FVC-Pre L 2.48  FVC-Predicted Pre % 88  FVC-Post L 2.55  FVC-Predicted Post % 91  Pre FEV1/FVC % % 61  Post FEV1/FCV % % 71  FEV1-Pre L 1.52  FEV1-Predicted Pre % 69  FEV1-Post L 1.81  DLCO uncorrected ml/min/mmHg 19.68  DLCO UNC% % 88  DLCO corrected ml/min/mmHg 19.68  DLCO COR %Predicted % 88  DLVA Predicted % 106  TLC L 4.78  TLC % Predicted % 84  RV % Predicted % 93   I have personally reviewed the patient's PFTs and show mild airflow limitation with +BD response.   Labs: Elevated eosinophils  Noted on prior CBCs while  on prednisone.   Immunization status: Immunization History  Administered Date(s) Administered  . Fluad Quad(high Dose 65+) 05/16/2019, 05/31/2020  . Influenza,inj,Quad PF,6+ Mos 06/17/2017, 06/09/2018  . Influenza-Unspecified 06/11/2016  . Moderna Sars-Covid-2 Vaccination 07/06/2020  . PFIZER(Purple Top)SARS-COV-2 Vaccination 10/20/2019, 11/14/2019  . Pneumococcal Conjugate-13 05/20/2018  . Pneumococcal Polysaccharide-23 02/15/2017  . Tdap 09/21/2017  . Zoster Recombinat (Shingrix) 07/19/2018    Assessment:  Severe persistent eosinophilic asthma, well controlled Allergic Rhinitis  Plan/Recommendations: Continue dupilumab injections and advair with prn albuterol. She has had remarkable benefit.  Continue treatment for allergic rhinitis with singulair and cetirizine.   Return to Care: Return in about 4 months (around 03/07/2021).   Maria Llamas, MD Pulmonary and [place]

## 2020-11-07 NOTE — Patient Instructions (Addendum)
The patient should have follow up scheduled with myself in 4 months.   Please call me if you have issues with your breathing.   Keep up the good work!

## 2020-11-08 IMAGING — DX DG CHEST 1V PORT
2 series · 2 of 2 positions shown · non-contrast
Comparison: 12/27/2019

CLINICAL DATA: Short of breath for 1 week, asthma

EXAM:
PORTABLE CHEST 1 VIEW

[chest ap (1 of 2)]
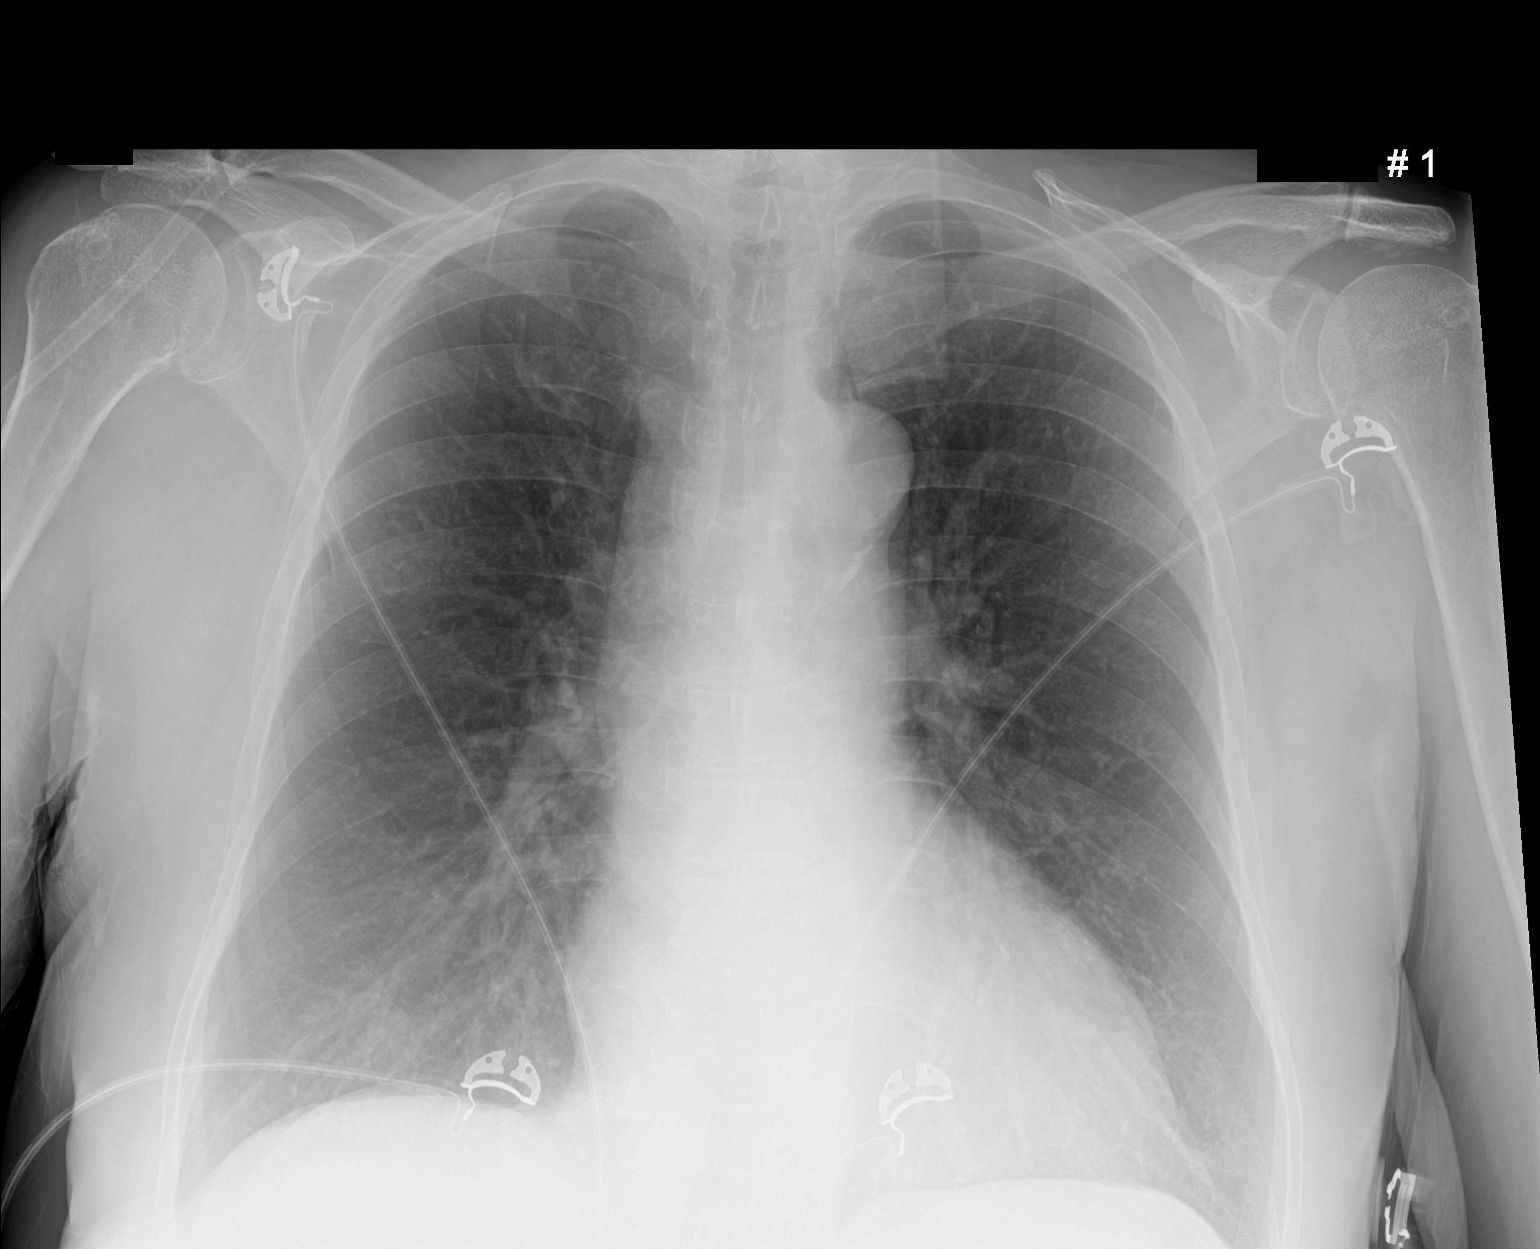

[chest ap (2 of 2)]
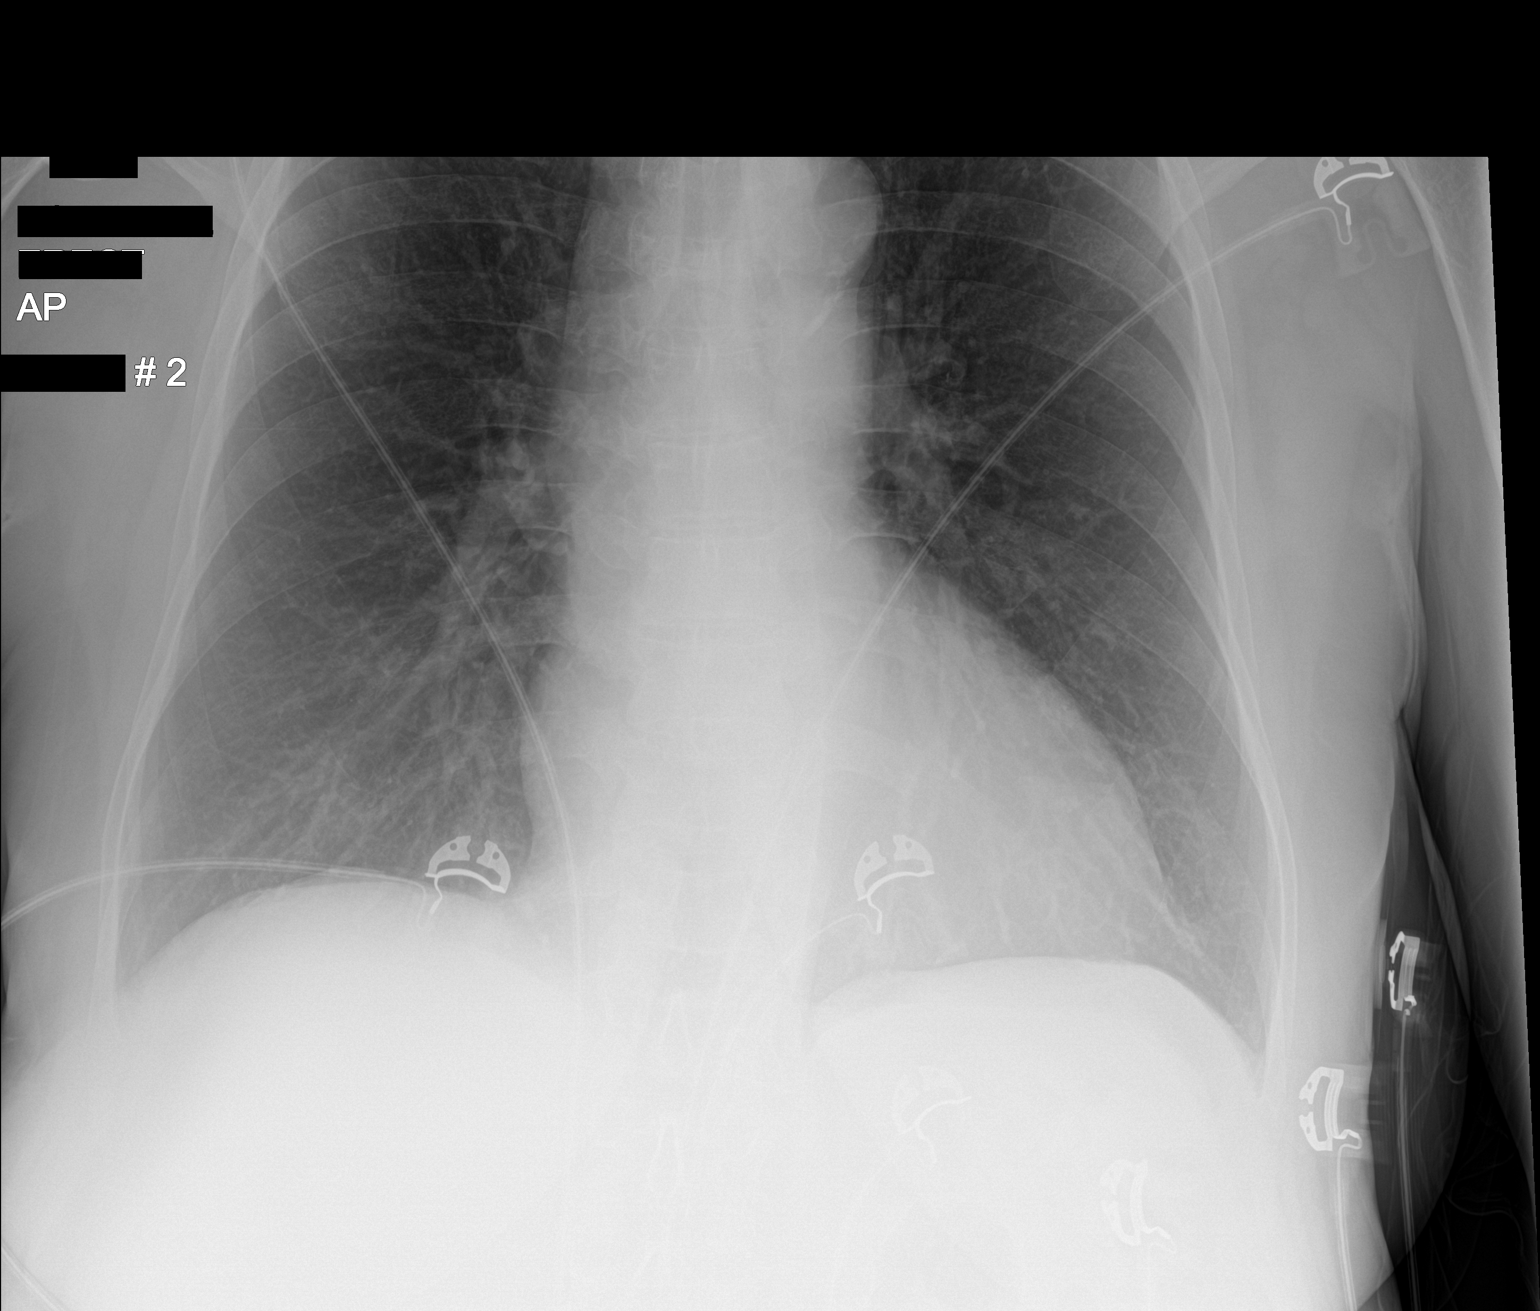

[2 of 2 positions shown; findings below may reference images not displayed]

FINDINGS: Two frontal views of the chest demonstrate a stable cardiac
silhouette. Minimal atherosclerosis of the aortic arch unchanged. No
airspace disease, effusion, or pneumothorax. No acute bony
abnormalities.
IMPRESSION: 1. Stable exam, no acute process.

## 2020-11-18 ENCOUNTER — Encounter: Payer: 59 | Admitting: Gastroenterology

## 2020-11-21 MED FILL — MONTELUKAST SOD 10 MG TAB: 10 | 30 days supply | Qty: 30 | Fill #7

## 2020-12-02 MED FILL — BIKTARVY 50-200-25 MG TABS: 50-200-25 | 30 days supply | Qty: 30 | Fill #4

## 2020-12-02 MED FILL — ALLERGY (CETIRIZINE) 10 MG: 10 | 90 days supply | Qty: 90 | Fill #1

## 2020-12-04 ENCOUNTER — Other Ambulatory Visit: Payer: Self-pay

## 2020-12-04 ENCOUNTER — Ambulatory Visit (AMBULATORY_SURGERY_CENTER): Payer: 59

## 2020-12-04 ENCOUNTER — Other Ambulatory Visit (HOSPITAL_BASED_OUTPATIENT_CLINIC_OR_DEPARTMENT_OTHER): Payer: Self-pay | Admitting: Internal Medicine

## 2020-12-04 VITALS — Ht 68.0 in | Wt 180.0 lb

## 2020-12-04 DIAGNOSIS — Z86010 Personal history of colonic polyps: Secondary | ICD-10-CM

## 2020-12-04 MED ORDER — GOLYTELY 236 G PO SOLR: 4000.0000 mL | ORAL | 0 refills | Status: DC

## 2020-12-04 MED FILL — PEG-3350 AND ELECTROLYTES S: 236 | 1 days supply | Qty: 4000 | Fill #0

## 2020-12-04 NOTE — Progress Notes (Signed)
Pre visit completed via phone call; Patient verified name, DOB, and address; No egg or soy allergy known to patient  No issues with past sedation with any surgeries or procedures Patient denies ever being told they had issues or difficulty with intubation  No FH of Malignant Hyperthermia No diet pills per patient No home 02 use per patient  No blood thinners per patient  Pt denies issues with constipation due to taking Amitiza No A fib or A flutter  EMMI video via MyChart  COVID 19 guidelines implemented in [place] today with Pt and RN  Coupon given to pt in PV today , Code to Pharmacy and  NO PA's for preps discussed with pt in PV today  Discussed with pt there will be an out-of-pocket cost for prep and that varies from $0 to 70 dollars  Due to the COVID-19 pandemic we are asking patients to follow certain guidelines.   Pt aware of COVID protocols and [place] guidelines  Patient reports partials;

## 2020-12-10 ENCOUNTER — Other Ambulatory Visit (HOSPITAL_COMMUNITY): Payer: Self-pay

## 2020-12-16 ENCOUNTER — Encounter: Payer: Self-pay | Admitting: Internal Medicine

## 2020-12-16 ENCOUNTER — Ambulatory Visit (AMBULATORY_SURGERY_CENTER): Payer: 59 | Admitting: Internal Medicine

## 2020-12-16 ENCOUNTER — Other Ambulatory Visit: Payer: Self-pay

## 2020-12-16 VITALS — BP 125/76 | HR 53 | Temp 98.6°F | Resp 18 | Ht 68.0 in | Wt 180.0 lb

## 2020-12-16 DIAGNOSIS — Z86010 Personal history of colonic polyps: Secondary | ICD-10-CM

## 2020-12-16 DIAGNOSIS — D125 Benign neoplasm of sigmoid colon: Secondary | ICD-10-CM | POA: Diagnosis not present

## 2020-12-16 DIAGNOSIS — D123 Benign neoplasm of transverse colon: Secondary | ICD-10-CM

## 2020-12-16 DIAGNOSIS — D120 Benign neoplasm of cecum: Secondary | ICD-10-CM | POA: Diagnosis not present

## 2020-12-16 DIAGNOSIS — Z1211 Encounter for screening for malignant neoplasm of colon: Secondary | ICD-10-CM | POA: Diagnosis not present

## 2020-12-16 MED ORDER — SODIUM CHLORIDE 0.9 % IV SOLN: 500.0000 mL | Freq: Once | INTRAVENOUS | Status: DC

## 2020-12-16 NOTE — Progress Notes (Signed)
C.W. vital signs. 

## 2020-12-16 NOTE — Progress Notes (Signed)
Called to room to assist during endoscopic procedure.  Patient ID and intended procedure confirmed with present staff. Received instructions for my participation in the procedure from the performing physician.  

## 2020-12-16 NOTE — Progress Notes (Signed)
A and O x3. Report to RN. Tolerated MAC anesthesia well.

## 2020-12-16 NOTE — Patient Instructions (Addendum)
I found and removed 3 tiny polyps that all look benign.  I will let you know pathology results and when to have another routine colonoscopy by mail and/or My Chart.  I appreciate the opportunity to care for you. Gatha Mayer, MD, [facility]  Polyp and diverticulosis handouts given to patient.  YOU HAD AN ENDOSCOPIC PROCEDURE TODAY AT [facility]:   Refer to the procedure report that was given to you for any specific questions about what was found during the examination.  If the procedure report does not answer your questions, please call your gastroenterologist to clarify.  If you requested that your care partner not be given the details of your procedure findings, then the procedure report has been included in a sealed envelope for you to review at your convenience later.  YOU SHOULD EXPECT: Some feelings of bloating in the abdomen. Passage of more gas than usual.  Walking can help get rid of the air that was put into your GI tract during the procedure and reduce the bloating. If you had a lower endoscopy (such as a colonoscopy or flexible sigmoidoscopy) you may notice spotting of blood in your stool or on the toilet paper. If you underwent a bowel prep for your procedure, you may not have a normal bowel movement for a few days.  Please Note:  You might notice some irritation and congestion in your nose or some drainage.  This is from the oxygen used during your procedure.  There is no need for concern and it should clear up in a day or so.  SYMPTOMS TO REPORT IMMEDIATELY:   Following lower endoscopy (colonoscopy or flexible sigmoidoscopy):  Excessive amounts of blood in the stool  Significant tenderness or worsening of abdominal pains  Swelling of the abdomen that is new, acute  Fever of 100F or higher  For urgent or emergent issues, a gastroenterologist can be reached at any hour by calling [phone]. Do not use MyChart messaging for urgent concerns.    DIET:  We do  recommend a small meal at first, but then you may proceed to your regular diet.  Drink plenty of fluids but you should avoid alcoholic beverages for 24 hours.  ACTIVITY:  You should plan to take it easy for the rest of today and you should NOT DRIVE or use heavy machinery until tomorrow (because of the sedation medicines used during the test).    FOLLOW UP: Our staff will call the number listed on your records 48-72 hours following your procedure to check on you and address any questions or concerns that you may have regarding the information given to you following your procedure. If we do not reach you, we will leave a message.  We will attempt to reach you two times.  During this call, we will ask if you have developed any symptoms of COVID 19. If you develop any symptoms (ie: fever, flu-like symptoms, shortness of breath, cough etc.) before then, please call [phone].  If you test positive for Covid 19 in the 2 weeks post procedure, please call and report this information to [place].    If any biopsies were taken you will be contacted by phone or by letter within the next 1-3 weeks.  Please call us at [phone] if you have not heard about the biopsies in 3 weeks.    SIGNATURES/CONFIDENTIALITY: You and/or your care partner have signed paperwork which will be entered into your electronic medical record.  These signatures attest  to the fact that that the information above on your After Visit Summary has been reviewed and is understood.  Full responsibility of the confidentiality of this discharge information lies with you and/or your care-partner. 

## 2020-12-16 NOTE — Op Note (Signed)
[place] Patient Name: Maria Adkins Procedure Date: 12/16/2020 10:09 AM MRN: [id number] Endoscopist: Gatha Mayer , MD Age: 70 Referring MD:  Date of Birth: 08-Aug-1951 Gender: Female Account #: [id number] Procedure:                Colonoscopy Indications:              Surveillance: Personal history of adenomatous                            polyps on last colonoscopy > 5 years ago, Last                            colonoscopy: 2015 Medicines:                Propofol per Anesthesia, Monitored Anesthesia Care Procedure:                Pre-Anesthesia Assessment:                           - Prior to the procedure, a History and Physical                            was performed, and patient medications and                            allergies were reviewed. The patient's tolerance of                            previous anesthesia was also reviewed. The risks                            and benefits of the procedure and the sedation                            options and risks were discussed with the patient.                            All questions were answered, and informed consent                            was obtained. Prior Anticoagulants: The patient has                            taken no previous anticoagulant or antiplatelet                            agents. ASA Grade Assessment: II - A patient with                            mild systemic disease. After reviewing the risks                            and benefits, the patient was deemed in  satisfactory condition to undergo the procedure.                           After obtaining informed consent, the colonoscope                            was passed under direct vision. Throughout the                            procedure, the patient's blood pressure, pulse, and                            oxygen saturations were monitored continuously. The                            Olympus PFC-[id number]  [id number]) Colonoscope was                            introduced through the anus and advanced to the the                            cecum, identified by appendiceal orifice and                            ileocecal valve. The colonoscopy was somewhat                            difficult due to significant looping. Successful                            completion of the procedure was aided by applying                            abdominal pressure. The patient tolerated the                            procedure well. The quality of the bowel                            preparation was adequate. The ileocecal valve,                            appendiceal orifice, and rectum were photographed.                            The bowel preparation used was GoLYTELY via split                            dose instruction. Scope In: 10:27:08 AM Scope Out: 10:47:52 AM Scope Withdrawal Time: 0 hours 13 minutes 52 seconds  Total Procedure Duration: 0 hours 20 minutes 44 seconds  Findings:                 The perianal and digital rectal examinations were  normal.                           Three sessile polyps were found in the sigmoid                            colon, transverse colon and cecum. The polyps were                            diminutive in size. These polyps were removed with                            a cold snare. Resection and retrieval were                            complete. Verification of patient identification                            for the specimen was done. Estimated blood loss was                            minimal.                           Diverticula were found in the sigmoid colon.                           The exam was otherwise without abnormality on                            direct and retroflexion views. Complications:            No immediate complications. Estimated Blood Loss:     Estimated blood loss was minimal. Impression:               -  Three diminutive polyps in the sigmoid colon, in                            the transverse colon and in the cecum, removed with                            a cold snare. Resected and retrieved.                           - Diverticulosis in the sigmoid colon.                           - The examination was otherwise normal on direct                            and retroflexion views.                           - Personal history of colonic polyp - diminutive  adenoma 2015. Recommendation:           - Patient has a contact number available for                            emergencies. The signs and symptoms of potential                            delayed complications were discussed with the                            patient. Return to normal activities tomorrow.                            Written discharge instructions were provided to the                            patient.                           - Resume previous diet.                           - Continue present medications.                           - Repeat colonoscopy is recommended for                            surveillance. The colonoscopy date will be                            determined after pathology results from today's                            exam become available for review. Gatha Mayer, MD 12/16/2020 10:55:32 AM This report has been signed electronically.

## 2020-12-16 NOTE — Progress Notes (Signed)
Pt's states no medical or surgical changes since previsit or office visit. 

## 2020-12-18 ENCOUNTER — Telehealth: Payer: Self-pay

## 2020-12-18 NOTE — Telephone Encounter (Signed)
LVM

## 2020-12-23 ENCOUNTER — Other Ambulatory Visit (HOSPITAL_COMMUNITY): Payer: Self-pay

## 2020-12-23 ENCOUNTER — Other Ambulatory Visit: Payer: Self-pay | Admitting: Internal Medicine

## 2020-12-23 DIAGNOSIS — B20 Human immunodeficiency virus [HIV] disease: Secondary | ICD-10-CM

## 2020-12-23 MED ORDER — BIKTARVY 50-200-25 MG PO TABS
1.0000 | ORAL_TABLET | Freq: Every day | ORAL | 4 refills | Status: DC
  Filled 2020-12-23: qty 30, 30d supply, fill #0
  Filled 2021-01-13: qty 30, 30d supply, fill #1
  Filled 2021-02-19: qty 30, 30d supply, fill #2
  Filled 2021-03-21: qty 30, 30d supply, fill #3
  Filled 2021-05-05: qty 30, 30d supply, fill #4

## 2020-12-24 ENCOUNTER — Other Ambulatory Visit (HOSPITAL_BASED_OUTPATIENT_CLINIC_OR_DEPARTMENT_OTHER): Payer: Self-pay

## 2020-12-24 MED FILL — Montelukast Sodium Tab 10 MG (Base Equiv): ORAL | 30 days supply | Qty: 30 | Fill #0 | Status: AC

## 2020-12-28 ENCOUNTER — Encounter: Payer: Self-pay | Admitting: Internal Medicine

## 2021-01-01 ENCOUNTER — Other Ambulatory Visit (HOSPITAL_COMMUNITY): Payer: Self-pay

## 2021-01-04 DIAGNOSIS — Z20822 Contact with and (suspected) exposure to covid-19: Secondary | ICD-10-CM | POA: Diagnosis not present

## 2021-01-04 DIAGNOSIS — Z03818 Encounter for observation for suspected exposure to other biological agents ruled out: Secondary | ICD-10-CM | POA: Diagnosis not present

## 2021-01-08 ENCOUNTER — Ambulatory Visit: Payer: PPO | Admitting: Nurse Practitioner

## 2021-01-13 ENCOUNTER — Other Ambulatory Visit (HOSPITAL_COMMUNITY): Payer: Self-pay

## 2021-01-16 ENCOUNTER — Other Ambulatory Visit: Payer: Self-pay | Admitting: Nurse Practitioner

## 2021-01-16 ENCOUNTER — Other Ambulatory Visit (HOSPITAL_BASED_OUTPATIENT_CLINIC_OR_DEPARTMENT_OTHER): Payer: Self-pay

## 2021-01-16 ENCOUNTER — Other Ambulatory Visit: Payer: Self-pay | Admitting: Internal Medicine

## 2021-01-16 MED FILL — Montelukast Sodium Tab 10 MG (Base Equiv): ORAL | 30 days supply | Qty: 30 | Fill #1 | Status: CN

## 2021-01-17 ENCOUNTER — Other Ambulatory Visit (HOSPITAL_BASED_OUTPATIENT_CLINIC_OR_DEPARTMENT_OTHER): Payer: Self-pay

## 2021-01-17 MED ORDER — FLUTICASONE-SALMETEROL 500-50 MCG/ACT IN AEPB
1.0000 | INHALATION_SPRAY | Freq: Two times a day (BID) | RESPIRATORY_TRACT | 11 refills | Status: DC
  Filled 2021-01-17: qty 60, 30d supply, fill #0
  Filled 2021-06-30: qty 60, 30d supply, fill #1

## 2021-01-17 MED FILL — Montelukast Sodium Tab 10 MG (Base Equiv): ORAL | 30 days supply | Qty: 30 | Fill #1 | Status: AC

## 2021-01-20 ENCOUNTER — Other Ambulatory Visit (HOSPITAL_BASED_OUTPATIENT_CLINIC_OR_DEPARTMENT_OTHER): Payer: Self-pay

## 2021-01-23 ENCOUNTER — Other Ambulatory Visit (HOSPITAL_BASED_OUTPATIENT_CLINIC_OR_DEPARTMENT_OTHER): Payer: Self-pay

## 2021-01-23 ENCOUNTER — Other Ambulatory Visit: Payer: Self-pay

## 2021-01-23 ENCOUNTER — Other Ambulatory Visit: Payer: Self-pay | Admitting: Nurse Practitioner

## 2021-01-23 DIAGNOSIS — Z79899 Other long term (current) drug therapy: Secondary | ICD-10-CM

## 2021-01-23 DIAGNOSIS — B20 Human immunodeficiency virus [HIV] disease: Secondary | ICD-10-CM

## 2021-01-23 MED ORDER — PRAVASTATIN SODIUM 20 MG PO TABS
ORAL_TABLET | Freq: Every day | ORAL | 1 refills | Status: DC
  Filled 2021-01-23: qty 90, 90d supply, fill #0
  Filled 2021-05-23: qty 90, 90d supply, fill #1

## 2021-01-27 ENCOUNTER — Other Ambulatory Visit: Payer: Self-pay | Admitting: Pharmacist

## 2021-01-27 ENCOUNTER — Other Ambulatory Visit (HOSPITAL_BASED_OUTPATIENT_CLINIC_OR_DEPARTMENT_OTHER): Payer: Self-pay

## 2021-01-27 ENCOUNTER — Other Ambulatory Visit: Payer: Self-pay | Admitting: Nurse Practitioner

## 2021-01-27 DIAGNOSIS — J4550 Severe persistent asthma, uncomplicated: Secondary | ICD-10-CM

## 2021-01-27 DIAGNOSIS — I10 Essential (primary) hypertension: Secondary | ICD-10-CM

## 2021-01-27 MED ORDER — DUPIXENT 300 MG/2ML ~~LOC~~ SOAJ: 300.0000 mg | SUBCUTANEOUS | 5 refills | Status: DC

## 2021-01-27 MED ORDER — ENALAPRIL-HYDROCHLOROTHIAZIDE 10-25 MG PO TABS
1.0000 | ORAL_TABLET | Freq: Every day | ORAL | 2 refills | Status: DC
  Filled 2021-01-27: qty 90, 90d supply, fill #0
  Filled 2021-04-17: qty 90, 90d supply, fill #1
  Filled 2021-08-04: qty 90, 90d supply, fill #2

## 2021-01-27 NOTE — Telephone Encounter (Signed)
Received refill req from [facility] for patient's Dupixent. Last r/f was sent Dec 2021.  Refill sent. Patient should have f/u with Dr. Shearon Stalls in the next 1-2 months  Knox Saliva, PharmD, MPH Clinical Pharmacist (Rheumatology and Pulmonology)

## 2021-01-28 ENCOUNTER — Other Ambulatory Visit: Payer: Self-pay

## 2021-01-28 ENCOUNTER — Other Ambulatory Visit: Payer: 59

## 2021-01-28 DIAGNOSIS — Z79899 Other long term (current) drug therapy: Secondary | ICD-10-CM

## 2021-01-28 DIAGNOSIS — B20 Human immunodeficiency virus [HIV] disease: Secondary | ICD-10-CM | POA: Diagnosis not present

## 2021-01-29 ENCOUNTER — Other Ambulatory Visit (HOSPITAL_BASED_OUTPATIENT_CLINIC_OR_DEPARTMENT_OTHER): Payer: Self-pay

## 2021-01-29 ENCOUNTER — Other Ambulatory Visit (HOSPITAL_COMMUNITY): Payer: Self-pay

## 2021-01-29 LAB — T-HELPER CELL (CD4) - (RCID CLINIC ONLY)
CD4 % Helper T Cell: 19 % — ABNORMAL LOW (ref 33–65)
CD4 T Cell Abs: 475 /uL (ref 400–1790)

## 2021-01-30 LAB — COMPLETE METABOLIC PANEL WITH GFR
AG Ratio: 1.7 (calc) (ref 1.0–2.5)
ALT: 18 U/L (ref 6–29)
AST: 22 U/L (ref 10–35)
Albumin: 4 g/dL (ref 3.6–5.1)
Alkaline phosphatase (APISO): 49 U/L (ref 37–153)
BUN: 12 mg/dL (ref 7–25)
CO2: 34 mmol/L — ABNORMAL HIGH (ref 20–32)
Calcium: 9.5 mg/dL (ref 8.6–10.4)
Chloride: 105 mmol/L (ref 98–110)
Creat: 0.9 mg/dL (ref 0.60–0.93)
GFR, Est African American: 75 mL/min/{1.73_m2} (ref >=60)
GFR, Est Non African American: 64 mL/min/{1.73_m2} (ref >=60)
Globulin: 2.4 g/dL (calc) (ref 1.9–3.7)
Glucose, Bld: 84 mg/dL (ref 65–99)
Potassium: 4.3 mmol/L (ref 3.5–5.3)
Sodium: 143 mmol/L (ref 135–146)
Total Bilirubin: 1.1 mg/dL (ref 0.2–1.2)
Total Protein: 6.4 g/dL (ref 6.1–8.1)

## 2021-01-30 LAB — HIV-1 RNA QUANT-NO REFLEX-BLD
HIV 1 RNA Quant: NOT DETECTED Copies/mL
HIV-1 RNA Quant, Log: NOT DETECTED Log cps/mL

## 2021-01-30 LAB — CBC WITH DIFFERENTIAL/PLATELET
Absolute Monocytes: 615 cells/uL (ref 200–950)
Basophils Absolute: 70 cells/uL (ref 0–200)
Basophils Relative: 1.2 %
Eosinophils Absolute: 261 cells/uL (ref 15–500)
Eosinophils Relative: 4.5 %
HCT: 38.7 % (ref 35.0–45.0)
Hemoglobin: 13 g/dL (ref 11.7–15.5)
Lymphs Abs: 2535 cells/uL (ref 850–3900)
MCH: 31 pg (ref 27.0–33.0)
MCHC: 33.6 g/dL (ref 32.0–36.0)
MCV: 92.1 fL (ref 80.0–100.0)
MPV: 10.2 fL (ref 7.5–12.5)
Monocytes Relative: 10.6 %
Neutro Abs: 2320 cells/uL (ref 1500–7800)
Neutrophils Relative %: 40 %
Platelets: 258 10*3/uL (ref 140–400)
RBC: 4.2 10*6/uL (ref 3.80–5.10)
RDW: 13.1 % (ref 11.0–15.0)
Total Lymphocyte: 43.7 %
WBC: 5.8 10*3/uL (ref 3.8–10.8)

## 2021-01-30 LAB — LIPID PANEL
Cholesterol: 171 mg/dL (ref <200)
HDL: 45 mg/dL — ABNORMAL LOW (ref >=50)
LDL Cholesterol (Calc): 103 mg/dL (calc) — ABNORMAL HIGH
Non-HDL Cholesterol (Calc): 126 mg/dL (calc) (ref <130)
Total CHOL/HDL Ratio: 3.8 (calc) (ref <5.0)
Triglycerides: 131 mg/dL (ref <150)

## 2021-02-03 ENCOUNTER — Other Ambulatory Visit (HOSPITAL_BASED_OUTPATIENT_CLINIC_OR_DEPARTMENT_OTHER): Payer: Self-pay

## 2021-02-03 ENCOUNTER — Other Ambulatory Visit: Payer: Self-pay | Admitting: Nurse Practitioner

## 2021-02-03 MED ORDER — METOPROLOL TARTRATE 100 MG PO TABS
ORAL_TABLET | Freq: Every day | ORAL | 0 refills | Status: DC
  Filled 2021-02-03: qty 90, 90d supply, fill #0

## 2021-02-11 ENCOUNTER — Other Ambulatory Visit (HOSPITAL_BASED_OUTPATIENT_CLINIC_OR_DEPARTMENT_OTHER): Payer: Self-pay

## 2021-02-11 MED FILL — Lubiprostone Cap 24 MCG: ORAL | 30 days supply | Qty: 60 | Fill #0 | Status: CN

## 2021-02-12 ENCOUNTER — Other Ambulatory Visit (HOSPITAL_BASED_OUTPATIENT_CLINIC_OR_DEPARTMENT_OTHER): Payer: Self-pay

## 2021-02-17 ENCOUNTER — Other Ambulatory Visit (HOSPITAL_BASED_OUTPATIENT_CLINIC_OR_DEPARTMENT_OTHER): Payer: Self-pay

## 2021-02-17 MED FILL — Sertraline HCl Tab 25 MG: ORAL | 90 days supply | Qty: 90 | Fill #0 | Status: AC

## 2021-02-18 ENCOUNTER — Encounter: Payer: 59 | Admitting: Internal Medicine

## 2021-02-19 ENCOUNTER — Other Ambulatory Visit: Payer: Self-pay

## 2021-02-19 ENCOUNTER — Other Ambulatory Visit (HOSPITAL_COMMUNITY): Payer: Self-pay

## 2021-02-19 ENCOUNTER — Encounter: Payer: Self-pay | Admitting: Internal Medicine

## 2021-02-19 ENCOUNTER — Ambulatory Visit (INDEPENDENT_AMBULATORY_CARE_PROVIDER_SITE_OTHER): Payer: 59 | Admitting: Internal Medicine

## 2021-02-19 VITALS — BP 146/84 | HR 66 | Wt 182.6 lb

## 2021-02-19 DIAGNOSIS — Z79899 Other long term (current) drug therapy: Secondary | ICD-10-CM | POA: Diagnosis not present

## 2021-02-19 DIAGNOSIS — M179 Osteoarthritis of knee, unspecified: Secondary | ICD-10-CM

## 2021-02-19 DIAGNOSIS — B20 Human immunodeficiency virus [HIV] disease: Secondary | ICD-10-CM | POA: Diagnosis not present

## 2021-02-19 DIAGNOSIS — Z0000 Encounter for general adult medical examination without abnormal findings: Secondary | ICD-10-CM | POA: Diagnosis not present

## 2021-02-19 NOTE — Progress Notes (Signed)
[name], female   DOB: [birth year], 70 y.o.   MRN: [id number]  HPI 70yo F with well controlled hiv disease, on biktarvy. has been working out 3-4 per week to keep in shape. She reports that she has Gained 11 lb. Over the last few months but attributes this to eating "junk food on the weekends"   Her health is otherwise okay. She did recently have colonoscopy by dr. Carlean Purl -- found Three sessile polyps - needs repeat in 1 yr per patient.  Outpatient Encounter Medications as of 02/19/2021  Medication Sig   bictegravir-emtricitabine-tenofovir AF (BIKTARVY) 50-200-25 MG TABS tablet TAKE 1 TABLET BY MOUTH DAILY.   cetirizine (ZYRTEC) 10 MG tablet TAKE 1 TABLET (10 MG TOTAL) BY MOUTH DAILY.   Dupilumab (DUPIXENT) 300 MG/2ML SOPN Inject 300 mg into the skin every 14 (fourteen) days.   enalapril-hydrochlorothiazide (VASERETIC) 10-25 MG tablet TAKE 1 TABLET BY MOUTH ONCE DAILY   fluticasone-salmeterol (ADVAIR) 500-50 MCG/ACT AEPB Inhale 1 puff into the lungs 2 (two) times daily.   metoprolol tartrate (LOPRESSOR) 100 MG tablet TAKE 1 TABLET BY MOUTH ONCE DAILY   montelukast (SINGULAIR) 10 MG tablet TAKE 1 TABLET (10 MG TOTAL) BY MOUTH AT BEDTIME.   Multiple Vitamins-Minerals (MULTIVITAMIN GUMMIES ADULT PO) Take 2 tablets by mouth daily. Gummy   pravastatin (PRAVACHOL) 20 MG tablet TAKE 1 TABLET BY MOUTH ONCE DAILY   sertraline (ZOLOFT) 25 MG tablet TAKE 1 TABLET (25 MG TOTAL) BY MOUTH DAILY.   vitamin B-12 (CYANOCOBALAMIN) 500 MCG tablet Take 500 mcg by mouth daily.   VITAMIN D PO Take 1 capsule by mouth daily.   albuterol (PROVENTIL) (2.5 MG/3ML) 0.083% nebulizer solution Take 3 mLs (2.5 mg total) by nebulization every 4 (four) hours as needed for wheezing or shortness of breath.   albuterol (VENTOLIN HFA) 108 (90 Base) MCG/ACT inhaler INHALE 2 PUFFS BY MOUTH INTO THE LUNGS EVERY 4 HOURS AS NEEDED   Blood Pressure Monitoring (BLOOD PRESSURE CUFF) MISC 1 Device by Does not apply  route daily. DX: I10   Calcium Carbonate (CALCIUM 600 PO) Take 1 tablet by mouth daily.    DENTA 5000 PLUS 1.1 % CREA dental cream Take by mouth daily.   lubiprostone (AMITIZA) 24 MCG capsule TAKE 1 CAPSULE (24 MCG TOTAL) BY MOUTH 2 (TWO) TIMES DAILY WITH A MEAL.   polyethylene glycol (GOLYTELY) 236 g solution Take 4,000 mLs by mouth as directed.   sodium fluoride (PREVIDENT 5000 PLUS) 1.1 % CREA dental cream BRUSH ON GEL FOR 1 MINUTE THEN EXPECTORATE   [DISCONTINUED] linaclotide (LINZESS) 145 MCG CAPS capsule Take 1 capsule (145 mcg total) by mouth daily before breakfast.   Facility-Administered Encounter Medications as of 02/19/2021  Medication   0.9 %  sodium chloride infusion     Patient Active Problem List   Diagnosis Date Noted   Asthma exacerbation 02/03/2020   Acute exacerbation of asthma with allergic rhinitis 02/02/2020   Seasonal allergies 01/13/2018   Asthma 02/05/2017   Allergy 12/16/2016   Age related osteoporosis 08/19/2016   Essential hypertension 08/19/2016   Human immunodeficiency virus (HIV) disease ([place]) 08/19/2016   Hx of adenomatous polyp of colon 2015     Health Maintenance Due  Topic Date Due   Pneumococcal Vaccine 59-27 Years old (1 of 4 - PCV13) Never done   Zoster Vaccines- Shingrix (2 of 2) 09/13/2018   COVID-19 Vaccine (4 - Booster) 10/06/2020    Sochx: no smoking or drinking  Review of Systems  Constitutional: weight gain. Negative for fever, chills, diaphoresis, activity change, appetite change, fatigue  HENT: Negative for congestion, sore throat, rhinorrhea, sneezing, trouble swallowing and sinus pressure.  Eyes: Negative for photophobia and visual disturbance.  Respiratory: Negative for cough, chest tightness, shortness of breath, wheezing and stridor.  Cardiovascular: Negative for chest pain, palpitations and leg swelling.  Gastrointestinal: Negative for nausea, vomiting, abdominal pain, diarrhea, constipation, blood in stool, abdominal  distention and anal bleeding.  Genitourinary: Negative for dysuria, hematuria, flank pain and difficulty urinating.  Musculoskeletal: +bilateral knee pain with exercise. Negative for myalgias, back pain, joint swelling, and gait problem.  Skin: Negative for color change, pallor, rash and wound.  Neurological: Negative for dizziness, tremors, weakness and light-headedness.  Hematological: Negative for adenopathy. Does not bruise/bleed easily.  Psychiatric/Behavioral: Negative for behavioral problems, confusion, sleep disturbance, dysphoric mood, decreased concentration and agitation.   Physical Exam   BP (!) 146/84   Pulse 66   Wt 182 lb 9.6 oz (82.8 kg)   BMI 27.76 kg/m   Physical Exam  Constitutional:  oriented to person, place, and time. appears well-developed and well-nourished. No distress.  HENT: [facility]/AT, PERRLA, no scleral icterus Mouth/Throat: Oropharynx is clear and moist. No oropharyngeal exudate.  Cardiovascular: Normal rate, regular rhythm and normal heart sounds. Exam reveals no gallop and no friction rub.  No murmur heard.  Pulmonary/Chest: Effort normal and breath sounds normal. No respiratory distress.  has no wheezes.  Neck = supple, no nuchal rigidity Lymphadenopathy: no cervical adenopathy. No axillary adenopathy Neurological: alert and oriented to person, place, and time.  Ext: knees not swelling, normal ROM no clicking noted with ROM Skin: Skin is warm and dry. No rash noted. No erythema.  Psychiatric: a normal mood and affect.  behavior is normal.   Lab Results  Component Value Date   CD4TCELL 19 (L) 01/28/2021   Lab Results  Component Value Date   CD4TABS 475 01/28/2021   CD4TABS 526 07/17/2020   CD4TABS 465 01/02/2020   Lab Results  Component Value Date   HIV1RNAQUANT Not Detected 01/28/2021   Lab Results  Component Value Date   HEPBSAB POS (A) 09/24/2016   Lab Results  Component Value Date   LABRPR NON-REACTIVE 07/17/2020    CBC Lab Results   Component Value Date   WBC 5.8 01/28/2021   RBC 4.20 01/28/2021   HGB 13.0 01/28/2021   HCT 38.7 01/28/2021   PLT 258 01/28/2021   MCV 92.1 01/28/2021   MCH 31.0 01/28/2021   MCHC 33.6 01/28/2021   RDW 13.1 01/28/2021   LYMPHSABS 2,535 01/28/2021   MONOABS 0.2 02/03/2020   EOSABS 261 01/28/2021    BMET Lab Results  Component Value Date   NA 143 01/28/2021   K 4.3 01/28/2021   CL 105 01/28/2021   CO2 34 (H) 01/28/2021   GLUCOSE 84 01/28/2021   BUN 12 01/28/2021   CREATININE 0.90 01/28/2021   CALCIUM 9.5 01/28/2021   GFRNONAA 64 01/28/2021   GFRAA 75 01/28/2021      Assessment and Plan   Hiv disease = well controlled. Plan on continuing with biktarvy  Long term medication managemnt =cr is stable  Health maintenance  = will need mammo; and needs repeat colon in 1 year Eye exam coming up  Bilateral knee pain = concern that she has worsening osteoarthritis where she needs knee replacement. She is very active for her age. If worsens, can get plan films and refer to orthopedics for evaluation

## 2021-02-26 ENCOUNTER — Other Ambulatory Visit (HOSPITAL_COMMUNITY): Payer: Self-pay

## 2021-03-03 ENCOUNTER — Ambulatory Visit: Payer: Self-pay

## 2021-03-03 ENCOUNTER — Encounter: Payer: Self-pay | Admitting: Orthopaedic Surgery

## 2021-03-03 ENCOUNTER — Other Ambulatory Visit (HOSPITAL_BASED_OUTPATIENT_CLINIC_OR_DEPARTMENT_OTHER): Payer: Self-pay

## 2021-03-03 ENCOUNTER — Ambulatory Visit (INDEPENDENT_AMBULATORY_CARE_PROVIDER_SITE_OTHER): Payer: 59 | Admitting: Orthopaedic Surgery

## 2021-03-03 ENCOUNTER — Ambulatory Visit (INDEPENDENT_AMBULATORY_CARE_PROVIDER_SITE_OTHER): Payer: 59

## 2021-03-03 ENCOUNTER — Other Ambulatory Visit: Payer: Self-pay

## 2021-03-03 VITALS — Ht 67.0 in | Wt 187.0 lb

## 2021-03-03 DIAGNOSIS — M25562 Pain in left knee: Secondary | ICD-10-CM | POA: Diagnosis not present

## 2021-03-03 DIAGNOSIS — M25561 Pain in right knee: Secondary | ICD-10-CM

## 2021-03-03 DIAGNOSIS — G8929 Other chronic pain: Secondary | ICD-10-CM

## 2021-03-03 MED ORDER — METHYLPREDNISOLONE ACETATE 40 MG/ML IJ SUSP
40.0000 mg | INTRAMUSCULAR | Status: AC | PRN
  Administered 2021-03-03: 40 mg via INTRA_ARTICULAR

## 2021-03-03 MED ORDER — LIDOCAINE HCL 1 % IJ SOLN
3.0000 mL | INTRAMUSCULAR | Status: AC | PRN
  Administered 2021-03-03: 3 mL

## 2021-03-03 MED FILL — Montelukast Sodium Tab 10 MG (Base Equiv): ORAL | 30 days supply | Qty: 30 | Fill #2 | Status: AC

## 2021-03-03 NOTE — Progress Notes (Signed)
Office Visit Note   Patient: Maria Adkins           Date of Birth: 1950/12/15           MRN: [id number] Visit Date: 03/03/2021              Requested by: Maria Basques, MD Maria Adkins,  [place] [address] PCP: Maria Chandler, NP   Assessment & Plan: Visit Diagnoses:  Adkins. Chronic pain of left knee   2. Chronic pain of right knee     Plan: I talked her in length about her knees and quad training exercises.  I did provide a steroid injection per her request in the left knee which did help her feel somewhat better.  I would like to reevaluate in 4 weeks after continued quad strengthening exercises.  I did give her handout about hyaluronic acid.  That would be our next step for her for her left knee.  All questions and concerns were answered and addressed.  Follow-Up Instructions: Return in about 4 weeks (around 03/31/2021).   Orders:  Orders Placed This Encounter  Procedures   Large Joint Inj   XR Knee Adkins-2 Views Left   XR Knee Adkins-2 Views Right   No orders of the defined types were placed in this encounter.     Procedures: Large Joint Inj: L knee on 03/03/2021 10:53 AM Indications: diagnostic evaluation and pain Details: 22 G Adkins.5 in needle, superolateral approach  Arthrogram: No  Medications: 3 mL lidocaine Adkins %; 40 mg methylPREDNISolone acetate 40 MG/ML Outcome: tolerated well, no immediate complications Procedure, treatment alternatives, risks and benefits explained, specific risks discussed. Consent was given by the patient. Immediately prior to procedure a time out was called to verify the correct patient, procedure, equipment, support staff and site/side marked as required. Patient was prepped and draped in the usual sterile fashion.      Clinical Data: No additional findings.   Subjective: No chief complaint on file. The patient is a very active and pleasant 70 year old female who comes in with bilateral knee pain with the left worse than  right this been going on for a long period of time.  She sometimes feels like the left knee is going to give out on her and it does pop and crack on occasion.  She does work out in [employer] but does not use a treadmill and does not run.  She is never had injections in her knees and never had surgery on her knees.  Her husband is one of the transporters over at [facility] and we know him really well.  HPI  Review of Systems There is currently listed no headache, chest pain, shortness of breath, fever, chills, nausea, vomiting  Objective: Vital Signs: Ht _0  (Adkins.702 m)   Wt 187 lb (84.8 kg)   BMI 29.29 kg/m   Physical Exam She is alert and orient x3 and in no acute distress Ortho Exam Examination of both knees shows no effusion.  Both knees have patellofemoral crepitation which is worse on the left than the right.  Both knees are ligamentously stable. Specialty Comments:  No specialty comments available.  Imaging: XR Knee Adkins-2 Views Left  Result Date: 03/03/2021 2 views of the left knee show no acute findings.  There is significant patellofemoral arthritic changes.  The medial lateral compartments are well-maintained and the alignment is neutral.  XR Knee Adkins-2 Views Right  Result Date: 03/03/2021 2 views of  the right knee show no acute findings.  There is patellofemoral narrowing.  The medial lateral compartments are well-maintained.    PMFS History: Patient Active Problem List   Diagnosis Date Noted   Asthma exacerbation 02/03/2020   Acute exacerbation of asthma with allergic rhinitis 02/02/2020   Seasonal allergies 01/13/2018   Asthma 02/05/2017   Allergy 12/16/2016   Age related osteoporosis 08/19/2016   Essential hypertension 08/19/2016   Human immunodeficiency virus (HIV) disease ([place]) 08/19/2016   Hx of adenomatous polyp of colon 2015   Past Medical History:  Diagnosis Date   Allergy    seasonal allergies   Asthma    uses unhaler   Blood transfusion without reported  diagnosis 1990's   Cataract    bilateral sx   Depression    on meds   High blood pressure    on meds   HIV (human immunodeficiency virus infection) ([facility])    on meds   Hx of adenomatous polyp of colon 2015   Hyperlipidemia    on meds   Osteoarthritis    Osteopenia     Family History  Problem Relation Age of Onset   Diabetes Mother        died at age 61   Hypertension Mother    Asthma Sister    Diabetes Sister    Hypertension Sister    Arthritis Sister    Diabetes Sister    Hypertension Sister    Asthma Sister    Arthritis Sister    Heart defect Sister    Asthma Son        controlled   Crohn's disease Niece    Colon polyps Neg Hx    Colon cancer Neg Hx    Esophageal cancer Neg Hx    Stomach cancer Neg Hx     Past Surgical History:  Procedure Laterality Date   Biopsy of Liver     CATARACT EXTRACTION, BILATERAL     CHOLECYSTECTOMY     COLONOSCOPY  2015   [facility] Adkins   CYST REMOVAL HAND Left    CYSTECTOMY     off of back   LAPAROSCOPIC SALPINGOOPHERECTOMY Right    POLYPECTOMY  2015   [facility]   Social History   Occupational History   Not on file  Tobacco Use   Smoking status: Former    Packs/day: Adkins.00    Years: 14.00    Pack years: 14.00    Types: Cigarettes    Quit date: Adkins/Adkins/1980    Years since quitting: 42.4   Smokeless tobacco: Never  Vaping Use   Vaping Use: Never used  Substance and Sexual Activity   Alcohol use: No   Drug use: No   Sexual activity: Yes    Partners: Male    Birth control/protection: Condom    Comment: declined condoms

## 2021-03-21 ENCOUNTER — Other Ambulatory Visit (HOSPITAL_COMMUNITY): Payer: Self-pay

## 2021-03-27 ENCOUNTER — Other Ambulatory Visit (HOSPITAL_COMMUNITY): Payer: Self-pay

## 2021-03-28 ENCOUNTER — Encounter: Payer: Self-pay | Admitting: Nurse Practitioner

## 2021-03-28 ENCOUNTER — Other Ambulatory Visit: Payer: Self-pay

## 2021-03-28 ENCOUNTER — Ambulatory Visit (INDEPENDENT_AMBULATORY_CARE_PROVIDER_SITE_OTHER): Payer: 59 | Admitting: Nurse Practitioner

## 2021-03-28 ENCOUNTER — Other Ambulatory Visit (HOSPITAL_BASED_OUTPATIENT_CLINIC_OR_DEPARTMENT_OTHER): Payer: Self-pay

## 2021-03-28 VITALS — BP 128/89 | HR 60 | Temp 97.0°F | Ht 67.0 in | Wt 186.0 lb

## 2021-03-28 DIAGNOSIS — B20 Human immunodeficiency virus [HIV] disease: Secondary | ICD-10-CM

## 2021-03-28 DIAGNOSIS — K5904 Chronic idiopathic constipation: Secondary | ICD-10-CM

## 2021-03-28 DIAGNOSIS — F321 Major depressive disorder, single episode, moderate: Secondary | ICD-10-CM

## 2021-03-28 DIAGNOSIS — F325 Major depressive disorder, single episode, in full remission: Secondary | ICD-10-CM | POA: Insufficient documentation

## 2021-03-28 DIAGNOSIS — E782 Mixed hyperlipidemia: Secondary | ICD-10-CM

## 2021-03-28 DIAGNOSIS — F5101 Primary insomnia: Secondary | ICD-10-CM | POA: Diagnosis not present

## 2021-03-28 DIAGNOSIS — J069 Acute upper respiratory infection, unspecified: Secondary | ICD-10-CM | POA: Diagnosis not present

## 2021-03-28 DIAGNOSIS — J4550 Severe persistent asthma, uncomplicated: Secondary | ICD-10-CM | POA: Diagnosis not present

## 2021-03-28 DIAGNOSIS — I10 Essential (primary) hypertension: Secondary | ICD-10-CM

## 2021-03-28 DIAGNOSIS — J302 Other seasonal allergic rhinitis: Secondary | ICD-10-CM | POA: Diagnosis not present

## 2021-03-28 NOTE — Progress Notes (Signed)
Careteam: Patient Care Team: Lauree Chandler, NP as PCP - General (Geriatric Medicine)  PLACE OF SERVICE:  [place] Directive information Does Patient Have a Medical Advance Directive?: No, Would patient like information on creating a medical advance directive?: No - Patient declined  No Known Allergies  Chief Complaint  Patient presents with   Medical Management of Chronic Issues    5 month follow up. Patient having mild cold symptoms. Headache and sore throat.   Health Maintenance    Zoster vaccine, 2nd COVID booster     HPI: Patient is a 70 y.o. female here for follow up.  Notes that she has sore throat and headache which started 2 days ago.  No fever, chills or body aches.  She has not taken a COVID test.  No cough, no chest congestion or nasal congestion.  No itchy or watery eyes.  Good energy at this time. Grandson had a cold and she always gets it.   Not sleeping well at night and then she gets headaches during the day from lack of sleep.   Asthma/allergy under good control no worsening shortness of breath, cough or congestion.   HIV- continues to follow up with ID.   Htn- controlled with enalapril-hctz, continues lifesytle modifications.   Could not afford amitiza.   Mood has been good on zoloft.  Review of Systems:  Review of Systems  Constitutional:  Negative for chills, fever and weight loss.  HENT:  Negative for tinnitus.   Respiratory:  Negative for cough, sputum production and shortness of breath.   Cardiovascular:  Negative for chest pain, palpitations and leg swelling.  Gastrointestinal:  Negative for abdominal pain, constipation, diarrhea and heartburn.  Genitourinary:  Negative for dysuria, frequency and urgency.  Musculoskeletal:  Negative for back pain, falls, joint pain and myalgias.  Skin: Negative.   Neurological:  Positive for headaches. Negative for dizziness.  Psychiatric/Behavioral:  Negative for depression and memory  loss. The patient has insomnia.    Past Medical History:  Diagnosis Date   Allergy    seasonal allergies   Asthma    uses unhaler   Blood transfusion without reported diagnosis 1990's   Cataract    bilateral sx   Depression    on meds   High blood pressure    on meds   HIV (human immunodeficiency virus infection) ([place])    on meds   Hx of adenomatous polyp of colon 2015   Hyperlipidemia    on meds   Osteoarthritis    Osteopenia    Past Surgical History:  Procedure Laterality Date   Biopsy of Liver     CATARACT EXTRACTION, BILATERAL     CHOLECYSTECTOMY     COLONOSCOPY  2015   [facility]-Dr.Liakos-prep(exc)-TA x 1   CYST REMOVAL HAND Left    CYSTECTOMY     off of back   LAPAROSCOPIC SALPINGOOPHERECTOMY Right    POLYPECTOMY  2015   TA   Social History:   reports that she quit smoking about 42 years ago. Her smoking use included cigarettes. She has a 14.00 pack-year smoking history. She has never used smokeless tobacco. She reports that she does not drink alcohol and does not use drugs.  Family History  Problem Relation Age of Onset   Diabetes Mother        died at age 98   Hypertension Mother    Asthma Sister    Diabetes Sister    Hypertension Sister  Arthritis Sister    Diabetes Sister    Hypertension Sister    Asthma Sister    Arthritis Sister    Heart defect Sister    Asthma Son        controlled   Crohn's disease Niece    Colon polyps Neg Hx    Colon cancer Neg Hx    Esophageal cancer Neg Hx    Stomach cancer Neg Hx     Medications: Patient's Medications  New Prescriptions   No medications on file  Previous Medications   ALBUTEROL (PROVENTIL) (2.5 MG/3ML) 0.083% NEBULIZER SOLUTION    Take 3 mLs (2.5 mg total) by nebulization every 4 (four) hours as needed for wheezing or shortness of breath.   ALBUTEROL (VENTOLIN HFA) 108 (90 BASE) MCG/ACT INHALER    INHALE 2 PUFFS BY MOUTH INTO THE LUNGS EVERY 4 HOURS AS NEEDED   BICTEGRAVIR-EMTRICITABINE-TENOFOVIR  AF (BIKTARVY) 50-200-25 MG TABS TABLET    TAKE 1 TABLET BY MOUTH DAILY.   BLOOD PRESSURE MONITORING (BLOOD PRESSURE CUFF) MISC    1 Device by Does not apply route daily. DX: I10   CALCIUM CARBONATE (CALCIUM 600 PO)    Take 1 tablet by mouth daily.    CETIRIZINE (ZYRTEC) 10 MG TABLET    TAKE 1 TABLET (10 MG TOTAL) BY MOUTH DAILY.   DENTA 5000 PLUS 1.1 % CREA DENTAL CREAM    Take by mouth daily.   DUPILUMAB (DUPIXENT) 300 MG/2ML SOPN    Inject 300 mg into the skin every 14 (fourteen) days.   ENALAPRIL-HYDROCHLOROTHIAZIDE (VASERETIC) 10-25 MG TABLET    TAKE 1 TABLET BY MOUTH ONCE DAILY   FLUTICASONE-SALMETEROL (ADVAIR) 500-50 MCG/ACT AEPB    Inhale 1 puff into the lungs 2 (two) times daily.   LUBIPROSTONE (AMITIZA) 24 MCG CAPSULE    TAKE 1 CAPSULE (24 MCG TOTAL) BY MOUTH 2 (TWO) TIMES DAILY WITH A MEAL.   METOPROLOL TARTRATE (LOPRESSOR) 100 MG TABLET    TAKE 1 TABLET BY MOUTH ONCE DAILY   MONTELUKAST (SINGULAIR) 10 MG TABLET    TAKE 1 TABLET (10 MG TOTAL) BY MOUTH AT BEDTIME.   MULTIPLE VITAMINS-MINERALS (MULTIVITAMIN GUMMIES ADULT PO)    Take 2 tablets by mouth daily. Gummy   POLYETHYLENE GLYCOL (GOLYTELY) 236 G SOLUTION    Take 4,000 mLs by mouth as directed.   PRAVASTATIN (PRAVACHOL) 20 MG TABLET    TAKE 1 TABLET BY MOUTH ONCE DAILY   SERTRALINE (ZOLOFT) 25 MG TABLET    TAKE 1 TABLET (25 MG TOTAL) BY MOUTH DAILY.   SODIUM FLUORIDE (PREVIDENT 5000 PLUS) 1.1 % CREA DENTAL CREAM    BRUSH ON GEL FOR 1 MINUTE THEN EXPECTORATE   VITAMIN B-12 (CYANOCOBALAMIN) 500 MCG TABLET    Take 500 mcg by mouth daily.   VITAMIN D PO    Take 1 capsule by mouth daily.  Modified Medications   No medications on file  Discontinued Medications   No medications on file    Physical Exam:  Vitals:   03/28/21 0836  BP: 128/89  Pulse: 60  Temp: (!) 97 F (36.1 C)  TempSrc: Temporal  SpO2: 98%  Weight: 186 lb (84.4 kg)  Height: _0  (1.702 m)   Body mass index is 29.13 kg/m. Wt Readings from Last 3  Encounters:  03/28/21 186 lb (84.4 kg)  03/03/21 187 lb (84.8 kg)  02/19/21 182 lb 9.6 oz (82.8 kg)    Physical Exam Constitutional:      General: She is  not in acute distress.    Appearance: She is well-developed. She is not diaphoretic.  HENT:     Head: Normocephalic and atraumatic.     Right Ear: Tympanic membrane and ear canal normal.     Left Ear: Tympanic membrane and ear canal normal.     Nose: Nose normal. No congestion.     Mouth/Throat:     Mouth: Mucous membranes are moist.     Pharynx: No oropharyngeal exudate.  Eyes:     Conjunctiva/sclera: Conjunctivae normal.     Pupils: Pupils are equal, round, and reactive to light.  Cardiovascular:     Rate and Rhythm: Normal rate and regular rhythm.     Heart sounds: Normal heart sounds.  Pulmonary:     Effort: Pulmonary effort is normal.     Breath sounds: Normal breath sounds.  Abdominal:     General: Bowel sounds are normal.     Palpations: Abdomen is soft.  Musculoskeletal:     Cervical back: Normal range of motion and neck supple.     Right lower leg: No edema.     Left lower leg: No edema.  Skin:    General: Skin is warm and dry.  Neurological:     Mental Status: She is alert and oriented to person, place, and time.  Psychiatric:        Mood and Affect: Mood normal.    Labs reviewed: Basic Metabolic Panel: Recent Labs    07/17/20 0829 10/28/20 0927 01/28/21 0846  NA 141 143 143  K 4.3 3.8 4.3  CL 103 104 105  CO2 33* 33* 34*  GLUCOSE 93 78 84  BUN _0 CREATININE 0.96* 0.96* 0.90  CALCIUM 9.9 9.8 9.5   Liver Function Tests: Recent Labs    07/17/20 0829 01/28/21 0846  AST 24 22  ALT 19 18  BILITOT 0.8 1.1  PROT 6.3 6.4   No results for input(s): LIPASE, AMYLASE in the last 8760 hours. No results for input(s): AMMONIA in the last 8760 hours. CBC: Recent Labs    07/17/20 0829 01/28/21 0846  WBC 5.9 5.8  NEUTROABS 2,673 2,320  HGB 13.6 13.0  HCT 40.4 38.7  MCV 91.2 92.1  PLT 251  258   Lipid Panel: Recent Labs    01/28/21 0846  CHOL 171  HDL 45*  LDLCALC 103*  TRIG 131  CHOLHDL 3.8   TSH: No results for input(s): TSH in the last 8760 hours. A1C: No results found for: HGBA1C   Assessment/Plan 1. Viral upper respiratory tract infection -supportive care at this time.  -to start vit d, vit c and zinc. -mucinex DM by mouth twice daily as needed cough and congestion. - return precautions given.  - SARS-COV-2 RNA,(COVID-19) QUAL NAAT  2. Depression, major, single episode, moderate (HCC) -stable on zoloft.   3. Chronic idiopathic constipation Could not afford linzess. Doing well with OTC at this time.   4. Essential hypertension Stable on enalapril-hctz and metoprolol  5. Severe persistent asthma, unspecified whether complicated -controlled at this time. Continues on dupixent with advair and sinulair  6. Human immunodeficiency virus (HIV) disease ([place]) -stable, continues to follow up with ID every 6 months. Continues on biktarvy   7. Seasonal allergies -stable  8. Primary insomnia -she has taken trazodone but gives her nightmares. Recommended melatonin 1 mg by mouth at bedtime to see if this improves sleep.   9. Mixed hyperlipidemia LDL at goal on pravachol. Continue dietary modifications.  Next appt: 3 months. Carlos American. [place], [facility] [phone]

## 2021-03-28 NOTE — Patient Instructions (Signed)
-  make sure you are staying well hydrated -recommended to take Vit C 1000 mg twice daily, Vit D 2000 units daily,  zinc 50 mg daily  -can use mucinex DM twice daily as needed for chest congestion and cough  Melatonin 1 mg by mouth at bedtime for sleep

## 2021-03-29 LAB — SARS-COV-2 RNA,(COVID-19) QUALITATIVE NAAT: SARS CoV2 RNA: NOT DETECTED

## 2021-03-31 ENCOUNTER — Other Ambulatory Visit (HOSPITAL_BASED_OUTPATIENT_CLINIC_OR_DEPARTMENT_OTHER): Payer: Self-pay

## 2021-03-31 ENCOUNTER — Ambulatory Visit: Payer: 59 | Admitting: Orthopaedic Surgery

## 2021-04-01 ENCOUNTER — Other Ambulatory Visit (HOSPITAL_COMMUNITY): Payer: Self-pay

## 2021-04-02 ENCOUNTER — Other Ambulatory Visit (HOSPITAL_BASED_OUTPATIENT_CLINIC_OR_DEPARTMENT_OTHER): Payer: Self-pay

## 2021-04-07 ENCOUNTER — Ambulatory Visit: Payer: 59

## 2021-04-17 ENCOUNTER — Other Ambulatory Visit: Payer: Self-pay | Admitting: Critical Care Medicine

## 2021-04-17 ENCOUNTER — Other Ambulatory Visit (HOSPITAL_BASED_OUTPATIENT_CLINIC_OR_DEPARTMENT_OTHER): Payer: Self-pay

## 2021-04-18 ENCOUNTER — Other Ambulatory Visit (HOSPITAL_BASED_OUTPATIENT_CLINIC_OR_DEPARTMENT_OTHER): Payer: Self-pay

## 2021-04-18 ENCOUNTER — Other Ambulatory Visit: Payer: Self-pay | Admitting: Critical Care Medicine

## 2021-04-21 ENCOUNTER — Other Ambulatory Visit: Payer: Self-pay | Admitting: Critical Care Medicine

## 2021-04-21 ENCOUNTER — Other Ambulatory Visit (HOSPITAL_BASED_OUTPATIENT_CLINIC_OR_DEPARTMENT_OTHER): Payer: Self-pay

## 2021-04-21 MED ORDER — MONTELUKAST SODIUM 10 MG PO TABS
ORAL_TABLET | Freq: Every day | ORAL | 11 refills | Status: DC
  Filled 2021-04-21: qty 30, 30d supply, fill #0
  Filled 2021-05-23: qty 30, 30d supply, fill #1
  Filled 2021-07-18: qty 30, 30d supply, fill #2
  Filled 2021-09-26: qty 30, 30d supply, fill #3
  Filled 2021-11-20: qty 30, 30d supply, fill #4
  Filled 2022-01-13 (×2): qty 30, 30d supply, fill #5
  Filled 2022-03-19: qty 30, 30d supply, fill #6

## 2021-04-23 ENCOUNTER — Other Ambulatory Visit (HOSPITAL_COMMUNITY): Payer: Self-pay

## 2021-05-05 ENCOUNTER — Other Ambulatory Visit (HOSPITAL_COMMUNITY): Payer: Self-pay

## 2021-05-05 ENCOUNTER — Other Ambulatory Visit: Payer: Self-pay | Admitting: Nurse Practitioner

## 2021-05-05 ENCOUNTER — Other Ambulatory Visit (HOSPITAL_BASED_OUTPATIENT_CLINIC_OR_DEPARTMENT_OTHER): Payer: Self-pay

## 2021-05-05 MED ORDER — METOPROLOL TARTRATE 100 MG PO TABS
ORAL_TABLET | Freq: Every day | ORAL | 0 refills | Status: DC
  Filled 2021-05-05: qty 90, 90d supply, fill #0

## 2021-05-21 ENCOUNTER — Other Ambulatory Visit (HOSPITAL_BASED_OUTPATIENT_CLINIC_OR_DEPARTMENT_OTHER): Payer: Self-pay

## 2021-05-21 MED FILL — Sertraline HCl Tab 25 MG: ORAL | 90 days supply | Qty: 90 | Fill #1 | Status: AC

## 2021-05-23 ENCOUNTER — Other Ambulatory Visit (HOSPITAL_BASED_OUTPATIENT_CLINIC_OR_DEPARTMENT_OTHER): Payer: Self-pay

## 2021-05-23 ENCOUNTER — Telehealth: Payer: Self-pay | Admitting: *Deleted

## 2021-05-23 NOTE — Telephone Encounter (Signed)
Patient notified and agreed. Appointment scheduled for 9/23

## 2021-05-23 NOTE — Telephone Encounter (Signed)
Will need appointment with Janett Billow to verify and discuss medication melatonin vs trazodone.

## 2021-05-23 NOTE — Telephone Encounter (Signed)
Received fax from [facility] stating that Patient is requesting refill for Trazodone.    Medication is not in patient's current medication list. Reviewed last OV dated 03/28/2021:  8. Primary insomnia -she has taken trazodone but gives her nightmares. Recommended melatonin 1 mg by mouth at bedtime to see if this improves sleep.   Called Patient and she stated that it was not the Trazodone giving her the nightmares it was the Melatonin.  Stated that she would like a refill on the Trazodone.    Please Advise. (Forwarded to [employer] [employer] due to [facility] out of office.)

## 2021-05-24 DIAGNOSIS — H524 Presbyopia: Secondary | ICD-10-CM | POA: Diagnosis not present

## 2021-05-27 ENCOUNTER — Other Ambulatory Visit: Payer: Self-pay | Admitting: Internal Medicine

## 2021-05-27 ENCOUNTER — Other Ambulatory Visit (HOSPITAL_COMMUNITY): Payer: Self-pay

## 2021-05-27 DIAGNOSIS — B20 Human immunodeficiency virus [HIV] disease: Secondary | ICD-10-CM

## 2021-05-27 MED ORDER — BIKTARVY 50-200-25 MG PO TABS
1.0000 | ORAL_TABLET | Freq: Every day | ORAL | 4 refills | Status: DC
  Filled 2021-05-27: qty 30, 30d supply, fill #0
  Filled 2021-06-25: qty 30, 30d supply, fill #1
  Filled 2021-07-17: qty 30, 30d supply, fill #2
  Filled ????-??-??: fill #3

## 2021-05-28 ENCOUNTER — Other Ambulatory Visit (HOSPITAL_COMMUNITY): Payer: Self-pay

## 2021-05-29 ENCOUNTER — Other Ambulatory Visit: Payer: Self-pay

## 2021-05-29 ENCOUNTER — Ambulatory Visit
Admission: RE | Admit: 2021-05-29 | Discharge: 2021-05-29 | Disposition: A | Discharge status: Home / Self Care | Payer: 59 | Source: Ambulatory Visit | Attending: Internal Medicine | Admitting: Internal Medicine

## 2021-05-29 DIAGNOSIS — Z1231 Encounter for screening mammogram for malignant neoplasm of breast: Secondary | ICD-10-CM | POA: Diagnosis not present

## 2021-05-29 DIAGNOSIS — Z0000 Encounter for general adult medical examination without abnormal findings: Secondary | ICD-10-CM

## 2021-06-06 ENCOUNTER — Other Ambulatory Visit (HOSPITAL_BASED_OUTPATIENT_CLINIC_OR_DEPARTMENT_OTHER): Payer: Self-pay

## 2021-06-06 ENCOUNTER — Other Ambulatory Visit: Payer: Self-pay

## 2021-06-06 ENCOUNTER — Ambulatory Visit (INDEPENDENT_AMBULATORY_CARE_PROVIDER_SITE_OTHER): Payer: 59 | Admitting: Nurse Practitioner

## 2021-06-06 ENCOUNTER — Encounter: Payer: Self-pay | Admitting: Nurse Practitioner

## 2021-06-06 VITALS — BP 124/72 | HR 58 | Temp 97.9°F | Ht 67.0 in | Wt 186.0 lb

## 2021-06-06 DIAGNOSIS — L089 Local infection of the skin and subcutaneous tissue, unspecified: Secondary | ICD-10-CM | POA: Diagnosis not present

## 2021-06-06 DIAGNOSIS — Z23 Encounter for immunization: Secondary | ICD-10-CM

## 2021-06-06 DIAGNOSIS — L729 Follicular cyst of the skin and subcutaneous tissue, unspecified: Secondary | ICD-10-CM | POA: Diagnosis not present

## 2021-06-06 MED ORDER — DOXYCYCLINE HYCLATE 100 MG PO TABS
100.0000 mg | ORAL_TABLET | Freq: Two times a day (BID) | ORAL | 0 refills | Status: DC
Start: 2021-06-06 — End: 2021-08-01
  Filled 2021-06-06: qty 20, 10d supply, fill #0

## 2021-06-06 NOTE — Patient Instructions (Addendum)
Start antibiotics today, take twice daily with food. Take until complete.  Warm compress with epsom salt- 3 times daily ~20 mins.  To use tylenol 1000 mg every 8 hours as needed pain.

## 2021-06-06 NOTE — Progress Notes (Signed)
Careteam: Patient Care Team: Lauree Chandler, NP as PCP - General (Geriatric Medicine)  PLACE OF SERVICE:  [place] Directive information    No Known Allergies  Chief Complaint  Patient presents with   Acute Visit    Patient presents today with the need to discuss sleep medication, OTC medications not helping. Patient would like raised area on the back of head examined, bigger in size, warm and tender to the touch, and causes a shooting pain at times. Flu vaccine today.     HPI: Patient is a 69 y.o. female due to painful knot on back of her head. Reports it has slowly worsen over the last year.  Red and painful.  Hard to sleep due to the pain Area has enlarged over the last week.  No fever or chills.  No neck pain or tenderness with movement.   Review of Systems:  Review of Systems  Constitutional:  Negative for chills and fever.  Skin:        Painful red, raised area on back of head, no drainage noted.    Past Medical History:  Diagnosis Date   Allergy    seasonal allergies   Asthma    uses unhaler   Blood transfusion without reported diagnosis 1990's   Cataract    bilateral sx   Depression    on meds   High blood pressure    on meds   HIV (human immunodeficiency virus infection) ([place])    on meds   Hx of adenomatous polyp of colon 2015   Hyperlipidemia    on meds   Osteoarthritis    Osteopenia    Past Surgical History:  Procedure Laterality Date   Biopsy of Liver     CATARACT EXTRACTION, BILATERAL     CHOLECYSTECTOMY     COLONOSCOPY  2015   [facility]-Dr.Liakos-prep(exc)-TA x 1   CYST REMOVAL HAND Left    CYSTECTOMY     off of back   LAPAROSCOPIC SALPINGOOPHERECTOMY Right    POLYPECTOMY  2015   TA   Social History:   reports that she quit smoking about 42 years ago. Her smoking use included cigarettes. She has a 14.00 pack-year smoking history. She has never used smokeless tobacco. She reports that she does not drink alcohol and does  not use drugs.  Family History  Problem Relation Age of Onset   Diabetes Mother        died at age 75   Hypertension Mother    Asthma Sister    Diabetes Sister    Hypertension Sister    Arthritis Sister    Diabetes Sister    Hypertension Sister    Asthma Sister    Arthritis Sister    Heart defect Sister    Asthma Son        controlled   Crohn's disease Niece    Colon polyps Neg Hx    Colon cancer Neg Hx    Esophageal cancer Neg Hx    Stomach cancer Neg Hx     Medications: Patient's Medications  New Prescriptions   No medications on file  Previous Medications   ALBUTEROL (PROVENTIL) (2.5 MG/3ML) 0.083% NEBULIZER SOLUTION    Take 3 mLs (2.5 mg total) by nebulization every 4 (four) hours as needed for wheezing or shortness of breath.   ALBUTEROL (VENTOLIN HFA) 108 (90 BASE) MCG/ACT INHALER    INHALE 2 PUFFS BY MOUTH INTO THE LUNGS EVERY 4 HOURS AS NEEDED  BICTEGRAVIR-EMTRICITABINE-TENOFOVIR AF (BIKTARVY) 50-200-25 MG TABS TABLET    TAKE 1 TABLET BY MOUTH DAILY.   BLOOD PRESSURE MONITORING (BLOOD PRESSURE CUFF) MISC    1 Device by Does not apply route daily. DX: I10   CALCIUM CARBONATE (CALCIUM 600 PO)    Take 1 tablet by mouth daily.    CETIRIZINE (ZYRTEC) 10 MG TABLET    TAKE 1 TABLET (10 MG TOTAL) BY MOUTH DAILY.   DENTA 5000 PLUS 1.1 % CREA DENTAL CREAM    Take by mouth daily.   DUPILUMAB (DUPIXENT) 300 MG/2ML SOPN    Inject 300 mg into the skin every 14 (fourteen) days.   ENALAPRIL-HYDROCHLOROTHIAZIDE (VASERETIC) 10-25 MG TABLET    TAKE 1 TABLET BY MOUTH ONCE DAILY   FLUTICASONE-SALMETEROL (ADVAIR) 500-50 MCG/ACT AEPB    Inhale 1 puff into the lungs 2 (two) times daily.   METOPROLOL TARTRATE (LOPRESSOR) 100 MG TABLET    TAKE 1 TABLET BY MOUTH ONCE DAILY   MONTELUKAST (SINGULAIR) 10 MG TABLET    TAKE 1 TABLET (10 MG TOTAL) BY MOUTH AT BEDTIME.   MULTIPLE VITAMINS-MINERALS (MULTIVITAMIN GUMMIES ADULT PO)    Take 2 tablets by mouth daily. Gummy   POLYETHYLENE GLYCOL  (GOLYTELY) 236 G SOLUTION    Take 4,000 mLs by mouth as directed.   PRAVASTATIN (PRAVACHOL) 20 MG TABLET    TAKE 1 TABLET BY MOUTH ONCE DAILY   SERTRALINE (ZOLOFT) 25 MG TABLET    TAKE 1 TABLET (25 MG TOTAL) BY MOUTH DAILY.   SODIUM FLUORIDE (PREVIDENT 5000 PLUS) 1.1 % CREA DENTAL CREAM    BRUSH ON GEL FOR 1 MINUTE THEN EXPECTORATE   VITAMIN B-12 (CYANOCOBALAMIN) 500 MCG TABLET    Take 500 mcg by mouth daily.   VITAMIN D PO    Take 1 capsule by mouth daily.  Modified Medications   No medications on file  Discontinued Medications   No medications on file    Physical Exam:  Vitals:   06/06/21 1128  BP: 124/72  Pulse: (!) 58  Temp: 97.9 F (36.6 C)  TempSrc: Temporal  SpO2: 98%  Weight: 186 lb (84.4 kg)  Height: _0  (1.702 m)   Body mass index is 29.13 kg/m. Wt Readings from Last 3 Encounters:  06/06/21 186 lb (84.4 kg)  03/28/21 186 lb (84.4 kg)  03/03/21 187 lb (84.8 kg)    Physical Exam Constitutional:      Appearance: Normal appearance.  HENT:     Head: Normocephalic and atraumatic.      Comments: Raised tender area. Mild redness noted in center. Without drainage or warm noted.  Musculoskeletal:     Cervical back: Full passive range of motion without pain.  Lymphadenopathy:     Cervical: No cervical adenopathy.  Neurological:     Mental Status: She is alert and oriented to person, place, and time.    Labs reviewed: Basic Metabolic Panel: Recent Labs    07/17/20 0829 10/28/20 0927 01/28/21 0846  NA 141 143 143  K 4.3 3.8 4.3  CL 103 104 105  CO2 33* 33* 34*  GLUCOSE 93 78 84  BUN _1 CREATININE 0.96* 0.96* 0.90  CALCIUM 9.9 9.8 9.5   Liver Function Tests: Recent Labs    07/17/20 0829 01/28/21 0846  AST 24 22  ALT 19 18  BILITOT 0.8 1.1  PROT 6.3 6.4   No results for input(s): LIPASE, AMYLASE in the last 8760 hours. No results for input(s): AMMONIA in the  last 8760 hours. CBC: Recent Labs    07/17/20 0829 01/28/21 0846  WBC 5.9  5.8  NEUTROABS 2,673 2,320  HGB 13.6 13.0  HCT 40.4 38.7  MCV 91.2 92.1  PLT 251 258   Lipid Panel: Recent Labs    01/28/21 0846  CHOL 171  HDL 45*  LDLCALC 103*  TRIG 131  CHOLHDL 3.8   TSH: No results for input(s): TSH in the last 8760 hours. A1C: No results found for: HGBA1C   Assessment/Plan 1. Need for influenza vaccination - Flu Vaccine QUAD High Dose(Fluad)  2. Infected cyst of skin Infected sebaceous cyst to occipital region of head.    -warm epsom salt soaks recommended 3 times daily ~20 mins.  To start antibiotics today twice daily and to take with food.  - doxycycline (VIBRA-TABS) 100 MG tablet; Take 1 tablet (100 mg total) by mouth 2 (two) times daily.  Dispense: 20 tablet; Refill: 0 -if area worsens becomes more painful/fever occurs to seek evaluation at urgent care of follow up in office.   Next appt: 06/16/2021 for follow up on infected cyst and sleep.  Carlos American. [place], [facility] [phone]

## 2021-06-16 ENCOUNTER — Other Ambulatory Visit: Payer: Self-pay

## 2021-06-16 ENCOUNTER — Ambulatory Visit (INDEPENDENT_AMBULATORY_CARE_PROVIDER_SITE_OTHER): Payer: 59 | Admitting: Nurse Practitioner

## 2021-06-16 ENCOUNTER — Other Ambulatory Visit (HOSPITAL_BASED_OUTPATIENT_CLINIC_OR_DEPARTMENT_OTHER): Payer: Self-pay

## 2021-06-16 ENCOUNTER — Encounter: Payer: Self-pay | Admitting: Nurse Practitioner

## 2021-06-16 VITALS — BP 110/78 | HR 60 | Temp 97.1°F | Ht 67.0 in | Wt 187.0 lb

## 2021-06-16 DIAGNOSIS — F5101 Primary insomnia: Secondary | ICD-10-CM

## 2021-06-16 DIAGNOSIS — L729 Follicular cyst of the skin and subcutaneous tissue, unspecified: Secondary | ICD-10-CM

## 2021-06-16 DIAGNOSIS — L089 Local infection of the skin and subcutaneous tissue, unspecified: Secondary | ICD-10-CM | POA: Diagnosis not present

## 2021-06-16 MED ORDER — TRAZODONE HCL 50 MG PO TABS
25.0000 mg | ORAL_TABLET | Freq: Every evening | ORAL | 3 refills | Status: DC | PRN
  Filled 2021-06-16: qty 30, 30d supply, fill #0
  Filled 2021-08-04: qty 30, 30d supply, fill #1
  Filled 2021-10-27: qty 30, 30d supply, fill #2
  Filled 2021-12-10: qty 30, 30d supply, fill #3

## 2021-06-16 NOTE — Progress Notes (Signed)
Careteam: Patient Care Team: Maria Chandler, NP as PCP - General (Geriatric Medicine)  PLACE OF SERVICE:  [place] Directive information    No Known Allergies  Chief Complaint  Patient presents with   Acute Visit    Cyst and sleep follow-up.     HPI: Patient is a 70 y.o. female to follow up infected cyst on back of head.  She completed course of doxycycline today. Reports she has done warm compresses. Reports tenderness is somewhat better but still there.   She reports melatonin does not help her sleep. Would like to restart trazodone. She had vivid dreams of things she was afraid of when she took medication last time.   Review of Systems:  Review of Systems  Constitutional:  Negative for chills and fever.  Skin:        Tender, red raised cyst to back of head  Psychiatric/Behavioral:  The patient has insomnia.    Past Medical History:  Diagnosis Date   Allergy    seasonal allergies   Asthma    uses unhaler   Blood transfusion without reported diagnosis 1990's   Cataract    bilateral sx   Depression    on meds   High blood pressure    on meds   HIV (human immunodeficiency virus infection) ([place])    on meds   Hx of adenomatous polyp of colon 2015   Hyperlipidemia    on meds   Osteoarthritis    Osteopenia    Past Surgical History:  Procedure Laterality Date   Biopsy of Liver     CATARACT EXTRACTION, BILATERAL     CHOLECYSTECTOMY     COLONOSCOPY  2015   [facility]-Dr.Liakos-prep(exc)-TA x 1   CYST REMOVAL HAND Left    CYSTECTOMY     off of back   LAPAROSCOPIC SALPINGOOPHERECTOMY Right    POLYPECTOMY  2015   TA   Social History:   reports that she quit smoking about 42 years ago. Her smoking use included cigarettes. She has a 14.00 pack-year smoking history. She has never used smokeless tobacco. She reports that she does not drink alcohol and does not use drugs.  Family History  Problem Relation Age of Onset   Diabetes Mother        died  at age 45   Hypertension Mother    Asthma Sister    Diabetes Sister    Hypertension Sister    Arthritis Sister    Diabetes Sister    Hypertension Sister    Asthma Sister    Arthritis Sister    Heart defect Sister    Asthma Son        controlled   Crohn's disease Niece    Colon polyps Neg Hx    Colon cancer Neg Hx    Esophageal cancer Neg Hx    Stomach cancer Neg Hx     Medications: Patient's Medications  New Prescriptions   No medications on file  Previous Medications   ALBUTEROL (PROVENTIL) (2.5 MG/3ML) 0.083% NEBULIZER SOLUTION    Take 3 mLs (2.5 mg total) by nebulization every 4 (four) hours as needed for wheezing or shortness of breath.   ALBUTEROL (VENTOLIN HFA) 108 (90 BASE) MCG/ACT INHALER    INHALE 2 PUFFS BY MOUTH INTO THE LUNGS EVERY 4 HOURS AS NEEDED   BICTEGRAVIR-EMTRICITABINE-TENOFOVIR AF (BIKTARVY) 50-200-25 MG TABS TABLET    TAKE 1 TABLET BY MOUTH DAILY.   BLOOD PRESSURE MONITORING (BLOOD PRESSURE CUFF)  MISC    1 Device by Does not apply route daily. DX: I10   CALCIUM CARBONATE (CALCIUM 600 PO)    Take 1 tablet by mouth daily.    CETIRIZINE (ZYRTEC) 10 MG TABLET    TAKE 1 TABLET (10 MG TOTAL) BY MOUTH DAILY.   DOXYCYCLINE (VIBRA-TABS) 100 MG TABLET    Take 1 tablet (100 mg total) by mouth 2 (two) times daily.   DUPILUMAB (DUPIXENT) 300 MG/2ML SOPN    Inject 300 mg into the skin every 14 (fourteen) days.   ENALAPRIL-HYDROCHLOROTHIAZIDE (VASERETIC) 10-25 MG TABLET    TAKE 1 TABLET BY MOUTH ONCE DAILY   FLUTICASONE-SALMETEROL (ADVAIR) 500-50 MCG/ACT AEPB    Inhale 1 puff into the lungs 2 (two) times daily.   METOPROLOL TARTRATE (LOPRESSOR) 100 MG TABLET    TAKE 1 TABLET BY MOUTH ONCE DAILY   MONTELUKAST (SINGULAIR) 10 MG TABLET    TAKE 1 TABLET (10 MG TOTAL) BY MOUTH AT BEDTIME.   MULTIPLE VITAMINS-MINERALS (MULTIVITAMIN GUMMIES ADULT PO)    Take 2 tablets by mouth daily. Gummy   POLYETHYLENE GLYCOL (GOLYTELY) 236 G SOLUTION    Take 4,000 mLs by mouth as directed.    PRAVASTATIN (PRAVACHOL) 20 MG TABLET    TAKE 1 TABLET BY MOUTH ONCE DAILY   SERTRALINE (ZOLOFT) 25 MG TABLET    TAKE 1 TABLET (25 MG TOTAL) BY MOUTH DAILY.   VITAMIN B-12 (CYANOCOBALAMIN) 500 MCG TABLET    Take 500 mcg by mouth daily.   VITAMIN D PO    Take 1 capsule by mouth daily.  Modified Medications   No medications on file  Discontinued Medications   No medications on file    Physical Exam:  Vitals:   06/16/21 1544  BP: 110/78  Pulse: 60  Temp: (!) 97.1 F (36.2 C)  TempSrc: Temporal  SpO2: 95%  Weight: 187 lb (84.8 kg)  Height: _0  (1.702 m)   Body mass index is 29.29 kg/m. Wt Readings from Last 3 Encounters:  06/16/21 187 lb (84.8 kg)  06/06/21 186 lb (84.4 kg)  03/28/21 186 lb (84.4 kg)    Physical Exam Constitutional:      Appearance: Normal appearance.  Skin:    Findings: Erythema present.     Comments: Raised tender area to occipital region of scalp, redness and warmth noted.   Neurological:     General: No focal deficit present.     Mental Status: She is alert.    Labs reviewed: Basic Metabolic Panel: Recent Labs    07/17/20 0829 10/28/20 0927 01/28/21 0846  NA 141 143 143  K 4.3 3.8 4.3  CL 103 104 105  CO2 33* 33* 34*  GLUCOSE 93 78 84  BUN _1 CREATININE 0.96* 0.96* 0.90  CALCIUM 9.9 9.8 9.5   Liver Function Tests: Recent Labs    07/17/20 0829 01/28/21 0846  AST 24 22  ALT 19 18  BILITOT 0.8 1.1  PROT 6.3 6.4   No results for input(s): LIPASE, AMYLASE in the last 8760 hours. No results for input(s): AMMONIA in the last 8760 hours. CBC: Recent Labs    07/17/20 0829 01/28/21 0846  WBC 5.9 5.8  NEUTROABS 2,673 2,320  HGB 13.6 13.0  HCT 40.4 38.7  MCV 91.2 92.1  PLT 251 258   Lipid Panel: Recent Labs    01/28/21 0846  CHOL 171  HDL 45*  LDLCALC 103*  TRIG 131  CHOLHDL 3.8   TSH: No results  for input(s): TSH in the last 8760 hours. A1C: No results found for: HGBA1C   Assessment/Plan  1. Infected  cyst of skin of scalp -without change after doxycycline. Continues to be red, raised and tender. Will have her evaluated by surgery for I&D - Ambulatory referral to General Surgery  2. Primary insomnia -had good response to trazodone and would like to restart - traZODone (DESYREL) 50 MG tablet; Take 1/2-1 tablet (25-50 mg total) by mouth at bedtime as needed for sleep.  Dispense: 30 tablet; Refill: 3  Marenda Accardi K. [place], [facility] [phone]

## 2021-06-18 DIAGNOSIS — L089 Local infection of the skin and subcutaneous tissue, unspecified: Secondary | ICD-10-CM | POA: Diagnosis not present

## 2021-06-18 DIAGNOSIS — L723 Sebaceous cyst: Secondary | ICD-10-CM | POA: Diagnosis not present

## 2021-06-25 ENCOUNTER — Other Ambulatory Visit (HOSPITAL_COMMUNITY): Payer: Self-pay

## 2021-06-30 ENCOUNTER — Other Ambulatory Visit (HOSPITAL_BASED_OUTPATIENT_CLINIC_OR_DEPARTMENT_OTHER): Payer: Self-pay

## 2021-07-09 ENCOUNTER — Telehealth: Payer: Self-pay | Admitting: Pharmacist

## 2021-07-09 DIAGNOSIS — J4550 Severe persistent asthma, uncomplicated: Secondary | ICD-10-CM

## 2021-07-09 MED ORDER — DUPIXENT 300 MG/2ML ~~LOC~~ SOAJ
300.0000 mg | SUBCUTANEOUS | 1 refills | Status: DC
Start: 2021-07-09 — End: 2021-10-30

## 2021-07-09 NOTE — Telephone Encounter (Signed)
Received refill request for patient's Dupixent from [facility].  Patient last seen by Dr. Shearon Stalls 11/07/20 with anticipated f/u in June 2022. Will route to triage to have patient scheduled for f/u appt  Refill sent for 2 months only to [place]. Dose: 300mg  SQ very 2 weeks  Knox Saliva, PharmD, MPH, BCPS Clinical Pharmacist (Rheumatology and Pulmonology)

## 2021-07-09 NOTE — Telephone Encounter (Signed)
Spoke with the pt and notified needs appt 1-2 months with Dr Shearon Stalls  I have scheduled her for this 08/14/21 Nothing further needed

## 2021-07-17 ENCOUNTER — Other Ambulatory Visit (HOSPITAL_COMMUNITY): Payer: Self-pay

## 2021-07-18 ENCOUNTER — Other Ambulatory Visit (HOSPITAL_BASED_OUTPATIENT_CLINIC_OR_DEPARTMENT_OTHER): Payer: Self-pay

## 2021-07-28 ENCOUNTER — Other Ambulatory Visit (HOSPITAL_COMMUNITY): Payer: Self-pay

## 2021-08-01 ENCOUNTER — Other Ambulatory Visit: Payer: Self-pay

## 2021-08-01 ENCOUNTER — Encounter: Payer: Self-pay | Admitting: Nurse Practitioner

## 2021-08-01 ENCOUNTER — Ambulatory Visit (INDEPENDENT_AMBULATORY_CARE_PROVIDER_SITE_OTHER): Payer: 59 | Admitting: Nurse Practitioner

## 2021-08-01 DIAGNOSIS — Z0000 Encounter for general adult medical examination without abnormal findings: Secondary | ICD-10-CM

## 2021-08-01 NOTE — Patient Instructions (Addendum)
[name] , Thank you for taking time to come for your Medicare Wellness Visit. I appreciate your ongoing commitment to your health goals. Please review the following plan we discussed and let me know if I can assist you in the future.   Screening recommendations/referrals: Colonoscopy up to date Mammogram up to date Bone Density up to date Recommended yearly ophthalmology/optometry visit for glaucoma screening and checkup Recommended yearly dental visit for hygiene and checkup  Vaccinations: Influenza vaccine up to date Pneumococcal vaccine up to date Tdap vaccine up to date Shingles vaccine up to date.  COVID booster- can get at local pharmacy    Advanced directives: recommended to complete and place on file.   Conditions/risks identified: advance age  Next appointment: yearly    Preventive Care 15 Years and Older, Female Preventive care refers to lifestyle choices and visits with your health care provider that can promote health and wellness. What does preventive care include? A yearly physical exam. This is also called an annual well check. Dental exams once or twice a year. Routine eye exams. Ask your health care provider how often you should have your eyes checked. Personal lifestyle choices, including: Daily care of your teeth and gums. Regular physical activity. Eating a healthy diet. Avoiding tobacco and drug use. Limiting alcohol use. Practicing safe sex. Taking low-dose aspirin every day. Taking vitamin and mineral supplements as recommended by your health care provider. What happens during an annual well check? The services and screenings done by your health care provider during your annual well check will depend on your age, overall health, lifestyle risk factors, and family history of disease. Counseling  Your health care provider may ask you questions about your: Alcohol use. Tobacco use. Drug use. Emotional well-being. Home and relationship  well-being. Sexual activity. Eating habits. History of falls. Memory and ability to understand (cognition). Work and work Statistician. Reproductive health. Screening  You may have the following tests or measurements: Height, weight, and BMI. Blood pressure. Lipid and cholesterol levels. These may be checked every 5 years, or more frequently if you are over 59 years old. Skin check. Lung cancer screening. You may have this screening every year starting at age 36 if you have a 30-pack-year history of smoking and currently smoke or have quit within the past 15 years. Fecal occult blood test (FOBT) of the stool. You may have this test every year starting at age 52. Flexible sigmoidoscopy or colonoscopy. You may have a sigmoidoscopy every 5 years or a colonoscopy every 10 years starting at age 22. Hepatitis C blood test. Hepatitis B blood test. Sexually transmitted disease (STD) testing. Diabetes screening. This is done by checking your blood sugar (glucose) after you have not eaten for a while (fasting). You may have this done every 1-3 years. Bone density scan. This is done to screen for osteoporosis. You may have this done starting at age 45. Mammogram. This may be done every 1-2 years. Talk to your health care provider about how often you should have regular mammograms. Talk with your health care provider about your test results, treatment options, and if necessary, the need for more tests. Vaccines  Your health care provider may recommend certain vaccines, such as: Influenza vaccine. This is recommended every year. Tetanus, diphtheria, and acellular pertussis (Tdap, Td) vaccine. You may need a Td booster every 10 years. Zoster vaccine. You may need this after age 80. Pneumococcal 13-valent conjugate (PCV13) vaccine. One dose is recommended after age 92. Pneumococcal polysaccharide (PPSV23)  vaccine. One dose is recommended after age 11. Talk to your health care provider about which  screenings and vaccines you need and how often you need them. This information is not intended to replace advice given to you by your health care provider. Make sure you discuss any questions you have with your health care provider. Document Released: 09/27/2015 Document Revised: 05/20/2016 Document Reviewed: 07/02/2015 Elsevier Interactive Patient Education  2017 [place] Prevention in the Home Falls can cause injuries. They can happen to people of all ages. There are many things you can do to make your home safe and to help prevent falls. What can I do on the outside of my home? Regularly fix the edges of walkways and driveways and fix any cracks. Remove anything that might make you trip as you walk through a door, such as a raised step or threshold. Trim any bushes or trees on the path to your home. Use bright outdoor lighting. Clear any walking paths of anything that might make someone trip, such as rocks or tools. Regularly check to see if handrails are loose or broken. Make sure that both sides of any steps have handrails. Any raised decks and porches should have guardrails on the edges. Have any leaves, snow, or ice cleared regularly. Use sand or salt on walking paths during winter. Clean up any spills in your garage right away. This includes oil or grease spills. What can I do in the bathroom? Use night lights. Install grab bars by the toilet and in the tub and shower. Do not use towel bars as grab bars. Use non-skid mats or decals in the tub or shower. If you need to sit down in the shower, use a plastic, non-slip stool. Keep the floor dry. Clean up any water that spills on the floor as soon as it happens. Remove soap buildup in the tub or shower regularly. Attach bath mats securely with double-sided non-slip rug tape. Do not have throw rugs and other things on the floor that can make you trip. What can I do in the bedroom? Use night lights. Make sure that you have a  light by your bed that is easy to reach. Do not use any sheets or blankets that are too big for your bed. They should not hang down onto the floor. Have a firm chair that has side arms. You can use this for support while you get dressed. Do not have throw rugs and other things on the floor that can make you trip. What can I do in the kitchen? Clean up any spills right away. Avoid walking on wet floors. Keep items that you use a lot in easy-to-reach places. If you need to reach something above you, use a strong step stool that has a grab bar. Keep electrical cords out of the way. Do not use floor polish or wax that makes floors slippery. If you must use wax, use non-skid floor wax. Do not have throw rugs and other things on the floor that can make you trip. What can I do with my stairs? Do not leave any items on the stairs. Make sure that there are handrails on both sides of the stairs and use them. Fix handrails that are broken or loose. Make sure that handrails are as long as the stairways. Check any carpeting to make sure that it is firmly attached to the stairs. Fix any carpet that is loose or worn. Avoid having throw rugs at the top or bottom  of the stairs. If you do have throw rugs, attach them to the floor with carpet tape. Make sure that you have a light switch at the top of the stairs and the bottom of the stairs. If you do not have them, ask someone to add them for you. What else can I do to help prevent falls? Wear shoes that: Do not have high heels. Have rubber bottoms. Are comfortable and fit you well. Are closed at the toe. Do not wear sandals. If you use a stepladder: Make sure that it is fully opened. Do not climb a closed stepladder. Make sure that both sides of the stepladder are locked into place. Ask someone to hold it for you, if possible. Clearly mark and make sure that you can see: Any grab bars or handrails. First and last steps. Where the edge of each step  is. Use tools that help you move around (mobility aids) if they are needed. These include: Canes. Walkers. Scooters. Crutches. Turn on the lights when you go into a dark area. Replace any light bulbs as soon as they burn out. Set up your furniture so you have a clear path. Avoid moving your furniture around. If any of your floors are uneven, fix them. If there are any pets around you, be aware of where they are. Review your medicines with your doctor. Some medicines can make you feel dizzy. This can increase your chance of falling. Ask your doctor what other things that you can do to help prevent falls. This information is not intended to replace advice given to you by your health care provider. Make sure you discuss any questions you have with your health care provider. Document Released: 06/27/2009 Document Revised: 02/06/2016 Document Reviewed: 10/05/2014 Elsevier Interactive Patient Education  2017 [employer].

## 2021-08-01 NOTE — Progress Notes (Signed)
This service is provided via telemedicine  No vital signs collected/recorded due to the encounter was a telemedicine visit.   Location of patient (ex: home, work):  Home  Patient consents to a telephone visit:  Yes, see encounter dated 08/01/2021  Location of the provider (ex: office, home):  [facility]  Name of any referring provider:  N/A  Names of all persons participating in the telemedicine service and their role in the encounter:  Sherrie Mustache, Nurse Practitioner, Carroll Kinds, CMA, and patient.   Time spent on call:  9 minutes with medical assistant

## 2021-08-01 NOTE — Progress Notes (Signed)
Subjective:   Maria Adkins is a 70 y.o. female who presents for Medicare Annual (Subsequent) preventive examination.  Review of Systems     Cardiac Risk Factors include: advanced age (>59mn, >>52women);hypertension     Objective:    There were no vitals filed for this visit. There is no height or weight on file to calculate BMI.  Advanced Directives 08/01/2021 03/28/2021 09/25/2020 07/30/2020 07/02/2020 05/31/2020 02/26/2020  Does Patient Have a Medical Advance Directive? No No No Yes No No No  Type of Advance Directive - - - [place]- - -  Does patient want to make changes to medical advance directive? - - Yes (MAU/Ambulatory/Procedural Areas - Information given) No - Patient declined - - No - Patient declined  Copy of [place] Chart? - - - No - copy requested - - -  Would patient like information on creating a medical advance directive? - No - Patient declined - - Yes (MAU/Ambulatory/Procedural Areas - Information given) Yes (MAU/Ambulatory/Procedural Areas - Information given) -    Current Medications (verified) Outpatient Encounter Medications as of 08/01/2021  Medication Sig   albuterol (PROVENTIL) (2.5 MG/3ML) 0.083% nebulizer solution Take 3 mLs (2.5 mg total) by nebulization every 4 (four) hours as needed for wheezing or shortness of breath.   albuterol (VENTOLIN HFA) 108 (90 Base) MCG/ACT inhaler INHALE 2 PUFFS BY MOUTH INTO THE LUNGS EVERY 4 HOURS AS NEEDED   bictegravir-emtricitabine-tenofovir AF (BIKTARVY) 50-200-25 MG TABS tablet TAKE 1 TABLET BY MOUTH DAILY.   Blood Pressure Monitoring (BLOOD PRESSURE CUFF) MISC 1 Device by Does not apply route daily. DX: I10   Calcium Carbonate (CALCIUM 600 PO) Take 1 tablet by mouth daily.    cetirizine (ZYRTEC) 10 MG tablet TAKE 1 TABLET (10 MG TOTAL) BY MOUTH DAILY.   Dupilumab (DUPIXENT) 300 MG/2ML SOPN Inject 300 mg into the skin every 14 (fourteen) days.   ELDERBERRY PO Take by mouth.    enalapril-hydrochlorothiazide (VASERETIC) 10-25 MG tablet TAKE 1 TABLET BY MOUTH ONCE DAILY   fluticasone-salmeterol (ADVAIR) 500-50 MCG/ACT AEPB Inhale 1 puff into the lungs 2 (two) times daily.   metoprolol tartrate (LOPRESSOR) 100 MG tablet TAKE 1 TABLET BY MOUTH ONCE DAILY   montelukast (SINGULAIR) 10 MG tablet TAKE 1 TABLET (10 MG TOTAL) BY MOUTH AT BEDTIME.   Multiple Vitamins-Minerals (MULTIVITAMIN GUMMIES ADULT PO) Take 2 tablets by mouth daily. Gummy   Multiple Vitamins-Minerals (ZINC PO) Take by mouth.   pravastatin (PRAVACHOL) 20 MG tablet TAKE 1 TABLET BY MOUTH ONCE DAILY   sertraline (ZOLOFT) 25 MG tablet TAKE 1 TABLET (25 MG TOTAL) BY MOUTH DAILY.   traZODone (DESYREL) 50 MG tablet Take 1/2-1 tablet (25-50 mg total) by mouth at bedtime as needed for sleep.   vitamin B-12 (CYANOCOBALAMIN) 500 MCG tablet Take 500 mcg by mouth daily.   VITAMIN D PO Take 1 capsule by mouth daily.   [DISCONTINUED] doxycycline (VIBRA-TABS) 100 MG tablet Take 1 tablet (100 mg total) by mouth 2 (two) times daily.   [DISCONTINUED] linaclotide (LINZESS) 145 MCG CAPS capsule Take 1 capsule (145 mcg total) by mouth daily before breakfast.   [DISCONTINUED] polyethylene glycol (GOLYTELY) 236 g solution Take 4,000 mLs by mouth as directed.   Facility-Administered Encounter Medications as of 08/01/2021  Medication   0.9 %  sodium chloride infusion    Allergies (verified) Patient has no known allergies.   History: Past Medical History:  Diagnosis Date   Allergy  seasonal allergies   Asthma    uses unhaler   Blood transfusion without reported diagnosis 1990's   Cataract    bilateral sx   Depression    on meds   High blood pressure    on meds   HIV (human immunodeficiency virus infection) (HCC)    on meds   Hx of adenomatous polyp of colon 2015   Hyperlipidemia    on meds   Osteoarthritis    Osteopenia    Past Surgical History:  Procedure Laterality Date   Biopsy of Liver     CATARACT  EXTRACTION, BILATERAL     CHOLECYSTECTOMY     COLONOSCOPY  2015   [facility]-Dr.Liakos-prep(exc)-TA x 1   CYST REMOVAL HAND Left    CYSTECTOMY     off of back   LAPAROSCOPIC SALPINGOOPHERECTOMY Right    POLYPECTOMY  2015   TA   Family History  Problem Relation Age of Onset   Diabetes Mother        died at age 22   Hypertension Mother    Asthma Sister    Diabetes Sister    Hypertension Sister    Arthritis Sister    Diabetes Sister    Hypertension Sister    Asthma Sister    Arthritis Sister    Heart defect Sister    Asthma Son        controlled   Crohn's disease Niece    Colon polyps Neg Hx    Colon cancer Neg Hx    Esophageal cancer Neg Hx    Stomach cancer Neg Hx    Social History   Socioeconomic History   Marital status: Married    Spouse name: Not on file   Number of children: Not on file   Years of education: Not on file   Highest education level: Not on file  Occupational History   Not on file  Tobacco Use   Smoking status: Former    Packs/day: 1.00    Years: 14.00    Pack years: 14.00    Types: Cigarettes    Quit date: 09/14/1978    Years since quitting: 42.9   Smokeless tobacco: Never  Vaping Use   Vaping Use: Never used  Substance and Sexual Activity   Alcohol use: No   Drug use: No   Sexual activity: Yes    Partners: Male    Birth control/protection: Condom    Comment: declined condoms  Other Topics Concern   Not on file  Social History Narrative   Diet? Regular      Do you drink/eat things with caffeine? Coffee      Marital status?  Married                                  What year were you married? March 16, 2009      Do you live in a house, apartment, assisted living, condo, trailer, etc.? House      Is it one or more stories? 1 story      How many persons live in your home? 2      Do you have any pets in your home? (please list) yes-dog      Current or past profession: Administrator      Do you exercise?  no  Type & how often? n/a      Do you have a living will? no      Do you have a DNR form?     no                             If not, do you want to discuss one? Yes      Do you have signed POA/HPOA for forms? no      Social Determinants of Health   Financial Resource Strain: Not on file  Food Insecurity: Not on file  Transportation Needs: Not on file  Physical Activity: Not on file  Stress: Not on file  Social Connections: Not on file    Tobacco Counseling Counseling given: Not Answered   Clinical Intake:  Pre-visit preparation completed: Yes  Pain : No/denies pain     BMI - recorded: 27 Nutritional Status: BMI 25 -29 Overweight Diabetes: No  How often do you need to have someone help you when you read instructions, pamphlets, or other written materials from your doctor or pharmacy?: 1 - Never  Diabetic?no         Activities of Daily Living In your present state of health, do you have any difficulty performing the following activities: 08/01/2021  Hearing? N  Vision? N  Difficulty concentrating or making decisions? N  Walking or climbing stairs? N  Dressing or bathing? N  Doing errands, shopping? N  Preparing Food and eating ? N  Using the Toilet? N  In the past six months, have you accidently leaked urine? N  Do you have problems with loss of bowel control? N  Managing your Medications? N  Managing your Finances? N  Housekeeping or managing your Housekeeping? N  Some recent data might be hidden    Patient Care Team: Lauree Chandler, NP as PCP - General (Geriatric Medicine)  Indicate any recent Medical Services you may have received from other than [facility] providers in the past year (date may be approximate).     Assessment:   This is a routine wellness examination for Maria Adkins.  Hearing/Vision screen Hearing Screening - Comments:: No hearing problems Vision Screening - Comments:: No vision problems. Had eye exam September. Goes to  [employer]  Dietary issues and exercise activities discussed: Current Exercise Habits: Home exercise routine, Type of exercise: treadmill;strength training/weights, Time (Minutes): 55, Frequency (Times/Week): 4, Weekly Exercise (Minutes/Week): 220   Goals Addressed   None    Depression Screen PHQ 2/9 Scores 08/01/2021 02/19/2021 10/28/2020 07/30/2020 07/02/2020 01/12/2020 10/09/2019  PHQ - 2 Score 0 0 0 0 0 0 1  PHQ- 9 Score - - - - - - -  Exception Documentation - - - - - - -  Not completed - - - - - - -    Fall Risk Fall Risk  08/01/2021 03/28/2021 02/19/2021 10/28/2020 09/25/2020  Falls in the past year? 0 0 0 0 0  Number falls in past yr: 0 0 - - 0  Injury with Fall? 0 0 - 0 0  Risk for fall due to : No Fall Risks No Fall Risks - - -  Follow up Falls evaluation completed Falls evaluation completed Falls evaluation completed - -    FALL RISK PREVENTION PERTAINING TO THE HOME:  Any stairs in or around the home? No  If so, are there any without handrails? No  Home free of loose throw rugs in walkways, pet beds, electrical  cords, etc? Yes  Adequate lighting in your home to reduce risk of falls? Yes   ASSISTIVE DEVICES UTILIZED TO PREVENT FALLS:  Life alert? No  Use of a cane, walker or w/c? No  Grab bars in the bathroom? Yes  Shower chair or bench in shower? Yes  Elevated toilet seat or a handicapped toilet? Yes   TIMED UP AND GO:  Was the test performed? No .    Cognitive Function: MMSE - Mini Mental State Exam 07/11/2018 09/16/2016  Orientation to time 5 5  Orientation to Place 5 5  Registration 3 3  Attention/ Calculation 5 5  Recall 2 3  Language- name 2 objects 2 2  Language- repeat 1 1  Language- follow 3 step command 3 3  Language- read & follow direction 1 1  Write a sentence 1 1  Copy design 1 1  Total score 29 30     6CIT Screen 08/01/2021 07/30/2020 07/14/2019  What Year? 0 points 0 points 0 points  What month? 0 points 0 points 0 points  What time? 0  points 0 points 0 points  Count back from 20 0 points 0 points 0 points  Months in reverse 0 points 0 points 0 points  Repeat phrase 0 points 0 points 0 points  Total Score 0 0 0    Immunizations Immunization History  Administered Date(s) Administered   Fluad Quad(high Dose 65+) 05/16/2019, 05/31/2020, 06/06/2021   Influenza,inj,Quad PF,6+ Mos 06/17/2017, 06/09/2018   Influenza-Unspecified 06/11/2016   Moderna Covid-19 Vaccine Bivalent Booster 77yr & up 07/12/2021   Moderna Sars-Covid-2 Vaccination 07/06/2020   PFIZER(Purple Top)SARS-COV-2 Vaccination 10/20/2019, 11/14/2019   Pneumococcal Conjugate-13 05/20/2018   Pneumococcal Polysaccharide-23 02/15/2017   Tdap 09/21/2017   Zoster Recombinat (Shingrix) 07/19/2018, 02/13/2019    TDAP status: Up to date  Flu Vaccine status: Up to date  Pneumococcal vaccine status: Up to date  Covid-19 vaccine status: Information provided on how to obtain vaccines.   Qualifies for Shingles Vaccine? Yes   Zostavax completed No   Shingrix Completed?: Yes  Screening Tests Health Maintenance  Topic Date Due   MAMMOGRAM  05/30/2023   TETANUS/TDAP  09/22/2027   COLONOSCOPY (Pts 45-468yrInsurance coverage will need to be confirmed)  12/17/2027   Pneumonia Vaccine 6525Years old  Completed   INFLUENZA VACCINE  Completed   DEXA SCAN  Completed   COVID-19 Vaccine  Completed   Hepatitis C Screening  Completed   Zoster Vaccines- Shingrix  Completed   HPV VACCINES  Aged Out    Health Maintenance  There are no preventive care reminders to display for this patient.   Colorectal cancer screening: Type of screening: Colonoscopy. Completed 12/16/2020. Repeat every 7 years  Mammogram status: Completed 05/29/21. Repeat every year  Bone Density status: Completed 09/20/20. Results reflect: Bone density results: OSTEOPENIA. Repeat every 2 years.  Lung Cancer Screening: (Low Dose CT Chest recommended if Age 72-80ears, 30 pack-year currently smoking OR  have quit w/in 15years.) does not qualify.   Lung Cancer Screening Referral: na  Additional Screening:  Hepatitis C Screening: does qualify; Completed   Vision Screening: Recommended annual ophthalmology exams for early detection of glaucoma and other disorders of the eye. Is the patient up to date with their annual eye exam?  Yes  Who is the provider or what is the name of the office in which the patient attends annual eye exams? [facility] If pt is not established with a provider, would they like to be  referred to a provider to establish care? No .   Dental Screening: Recommended annual dental exams for proper oral hygiene  Community Resource Referral / Chronic Care Management: CRR required this visit?  No   CCM required this visit?  No      Plan:     I have personally reviewed and noted the following in the patient's chart:   Medical and social history Use of alcohol, tobacco or illicit drugs  Current medications and supplements including opioid prescriptions.  Functional ability and status Nutritional status Physical activity Advanced directives List of other physicians Hospitalizations, surgeries, and ER visits in previous 12 months Vitals Screenings to include cognitive, depression, and falls Referrals and appointments  In addition, I have reviewed and discussed with patient certain preventive protocols, quality metrics, and best practice recommendations. A written personalized care plan for preventive services as well as general preventive health recommendations were provided to patient.     Lauree Chandler, NP   08/01/2021    Virtual Visit via Telephone Note  I connected withNAME@ on 08/01/21 at  3:45 PM EST by telephone and verified that I am speaking with the correct person using two identifiers.  Location: Patient: home Provider: [facility] office   I discussed the limitations, risks, security and privacy concerns of performing an evaluation and  management service by telephone and the availability of in person appointments. I also discussed with the patient that there may be a patient responsible charge related to this service. The patient expressed understanding and agreed to proceed.   I discussed the assessment and treatment plan with the patient. The patient was provided an opportunity to ask questions and all were answered. The patient agreed with the plan and demonstrated an understanding of the instructions.   The patient was advised to call back or seek an in-person evaluation if the symptoms worsen or if the condition fails to improve as anticipated.  I provided 12 minutes of non-face-to-face time during this encounter.  Carlos American. Harle Battiest Avs printed and mailed

## 2021-08-04 ENCOUNTER — Other Ambulatory Visit (HOSPITAL_BASED_OUTPATIENT_CLINIC_OR_DEPARTMENT_OTHER): Payer: Self-pay

## 2021-08-04 ENCOUNTER — Other Ambulatory Visit: Payer: Self-pay | Admitting: Nurse Practitioner

## 2021-08-04 DIAGNOSIS — F321 Major depressive disorder, single episode, moderate: Secondary | ICD-10-CM

## 2021-08-04 MED ORDER — SERTRALINE HCL 25 MG PO TABS
ORAL_TABLET | Freq: Every day | ORAL | 2 refills | Status: DC
  Filled 2021-08-04: qty 90, 90d supply, fill #0
  Filled 2021-11-26: qty 90, 90d supply, fill #1
  Filled 2022-02-26: qty 90, 90d supply, fill #2

## 2021-08-04 MED ORDER — METOPROLOL TARTRATE 100 MG PO TABS
ORAL_TABLET | Freq: Every day | ORAL | 1 refills | Status: DC
  Filled 2021-08-04: qty 90, 90d supply, fill #0
  Filled 2021-10-30: qty 90, 90d supply, fill #1

## 2021-08-04 NOTE — Telephone Encounter (Signed)
Patient has request refill on medications "Metoprolol" and "Zoloft". Patient medications refilled on Meto 05/05/2021 and Sertr 10/28/2020. Patient medication has High Risk Warnings. Medications pend and sent to PCP Dewaine Oats Carlos American, NP for approval. Please Advise.

## 2021-08-05 ENCOUNTER — Other Ambulatory Visit (HOSPITAL_BASED_OUTPATIENT_CLINIC_OR_DEPARTMENT_OTHER): Payer: Self-pay

## 2021-08-14 ENCOUNTER — Other Ambulatory Visit: Payer: Self-pay

## 2021-08-14 ENCOUNTER — Encounter: Payer: Self-pay | Admitting: Internal Medicine

## 2021-08-14 ENCOUNTER — Ambulatory Visit (INDEPENDENT_AMBULATORY_CARE_PROVIDER_SITE_OTHER): Payer: 59 | Admitting: Internal Medicine

## 2021-08-14 ENCOUNTER — Other Ambulatory Visit (HOSPITAL_BASED_OUTPATIENT_CLINIC_OR_DEPARTMENT_OTHER): Payer: Self-pay

## 2021-08-14 DIAGNOSIS — J301 Allergic rhinitis due to pollen: Secondary | ICD-10-CM

## 2021-08-14 MED ORDER — SPACER/AERO-HOLDING CHAMBERS DEVI
0 refills | Status: AC
  Filled 2021-08-14: qty 1, 30d supply, fill #0

## 2021-08-14 MED ORDER — ADVAIR HFA 115-21 MCG/ACT IN AERO
1.0000 | INHALATION_SPRAY | Freq: Two times a day (BID) | RESPIRATORY_TRACT | 12 refills | Status: DC
  Filled 2021-08-14: qty 12, 30d supply, fill #0
  Filled 2021-11-20 – 2021-12-10 (×2): qty 12, 30d supply, fill #1
  Filled 2022-04-07: qty 12, 30d supply, fill #2
  Filled 2022-05-20: qty 12, 30d supply, fill #3

## 2021-08-14 NOTE — Progress Notes (Signed)
The patient has been prescribed the inhaler adair HFA with spacer. Inhaler technique was demonstrated to patient. The patient subsequently demonstrated correct technique.

## 2021-08-14 NOTE — Patient Instructions (Addendum)
Please schedule follow up scheduled with myself in 6 months.  If my schedule is not open yet, we will contact you with a reminder closer to that time. Please call if you haven't heard something in 5 months.   We are switching you from high dose advair DPI to the medium dose advair HFA. Use this with a spacer.   1 puff in the morning, 1 puff at night. Use with a spacer, gargle after use.   Take the albuterol rescue inhaler every 4 to 6 hours as needed for wheezing or shortness of breath. You can also take it 15 minutes before exercise or exertional activity. Side effects include heart racing or pounding, jitters or anxiety. If you have a history of an irregular heart rhythm, it can make this worse. Can also give some patients a hard time sleeping.

## 2021-08-14 NOTE — Progress Notes (Signed)
Maria Adkins    [id number]    [birth year]  Primary Care Physician:Eubanks, Carlos American, NP Date of Appointment: 08/14/2021 Established Patient Visit  Chief complaint:   Chief Complaint  Patient presents with   Follow-up    Patient feels that she is doing well overall.      HPI: Maria Adkins is a 70 y.o. 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 On Dupilumab started 06/06/2020 for severe steroid dependent asthma.   Interval Updates: No prednisone use. No exacerbations or hospitalizations. No issues with home injections, no site reactions or adverse effects. Is able to exercise with low impact with weights and walking. She gets short of breath with stairs but this subsides on its own time and she doesn't need to take albuterol.   Current Regimen: high dose advair, singulair, flonase, cetirizine, dupilumab Asthma Triggers: stress, perfume, pollen, smoke, burnt foods cold weather Exacerbations in the last year: none since July 2021 History of hospitalization or intubation: yes many times - last time in 2020. Required intubation during a hospital stay more than ten years ago. Previously very poorly controlled asthma Hives: none Allergy Testing: as a child.  GERD: none Allergic Rhinitis: yes on flonase, cetirizine ACT:  Asthma Control Test ACT Total Score  08/14/2021 24  11/07/2020 25  08/01/2020 25   Lives at home with husband and son's family including grandson. Helps out with his daily care. No pets at home. No passive smoke exposure.    I have reviewed the patient's family social and past medical history and updated as appropriate.   Past Medical History:  Diagnosis Date   Allergy    seasonal allergies   Asthma    uses unhaler   Blood transfusion without reported diagnosis 1990's   Cataract    bilateral sx   Depression    on meds   High blood pressure    on meds   HIV (human immunodeficiency virus infection) ([place])    on  meds   Hx of adenomatous polyp of colon 2015   Hyperlipidemia    on meds   Osteoarthritis    Osteopenia     Past Surgical History:  Procedure Laterality Date   Biopsy of Liver     CATARACT EXTRACTION, BILATERAL     CHOLECYSTECTOMY     COLONOSCOPY  2015   [facility]-Dr.Liakos-prep(exc)-TA x 1   CYST REMOVAL HAND Left    CYSTECTOMY     off of back   LAPAROSCOPIC SALPINGOOPHERECTOMY Right    POLYPECTOMY  2015   TA    Family History  Problem Relation Age of Onset   Diabetes Mother        died at age 42   Hypertension Mother    Asthma Sister    Diabetes Sister    Hypertension Sister    Arthritis Sister    Diabetes Sister    Hypertension Sister    Asthma Sister    Arthritis Sister    Heart defect Sister    Asthma Son        controlled   Crohn's disease Niece    Colon polyps Neg Hx    Colon cancer Neg Hx    Esophageal cancer Neg Hx    Stomach cancer Neg Hx     Social History   Occupational History   Not on file  Tobacco Use   Smoking status: Former    Packs/day: 1.00    Years: 14.00  Pack years: 14.00    Types: Cigarettes    Quit date: 09/14/1978    Years since quitting: 42.9   Smokeless tobacco: Never  Vaping Use   Vaping Use: Never used  Substance and Sexual Activity   Alcohol use: No   Drug use: No   Sexual activity: Yes    Partners: Male    Birth control/protection: Condom    Comment: declined condoms     Physical Exam: Blood pressure 120/78, pulse 60, temperature 98.3 F (36.8 C), temperature source Oral, height _0  (1.702 m), weight 186 lb (84.4 kg), SpO2 98 %.  Gen:      NAD HEENT:   no thrush, mild cobblestoning, mallampati IV Lungs:    ctab no wheezes or crackles CV:         RRR no mrg no edema   Data Reviewed: Imaging: I have personally reviewed the chest xray June 2021 - mild hyperinflation.   PFTs:   PFT Results Latest Ref Rng & Units 04/22/2020  FVC-Pre L 2.48  FVC-Predicted Pre % 88  FVC-Post L 2.55  FVC-Predicted Post % 91   Pre FEV1/FVC % % 61  Post FEV1/FCV % % 71  FEV1-Pre L 1.52  FEV1-Predicted Pre % 69  FEV1-Post L 1.81  DLCO uncorrected ml/min/mmHg 19.68  DLCO UNC% % 88  DLCO corrected ml/min/mmHg 19.68  DLCO COR %Predicted % 88  DLVA Predicted % 106  TLC L 4.78  TLC % Predicted % 84  RV % Predicted % 93   I have personally reviewed the patient's PFTs and show mild airflow limitation with +BD response.   Labs: Elevated eosinophils  Noted on prior CBCs while on prednisone.   Immunization status: Immunization History  Administered Date(s) Administered   Fluad Quad(high Dose 65+) 05/16/2019, 05/31/2020, 06/06/2021   Influenza,inj,Quad PF,6+ Mos 06/17/2017, 06/09/2018   Influenza-Unspecified 06/11/2016   Moderna Covid-19 Vaccine Bivalent Booster 55yr & up 07/12/2021   Moderna Sars-Covid-2 Vaccination 07/06/2020   PFIZER(Purple Top)SARS-COV-2 Vaccination 10/20/2019, 11/14/2019   Pneumococcal Conjugate-13 05/20/2018   Pneumococcal Polysaccharide-23 02/15/2017   Tdap 09/21/2017   Zoster Recombinat (Shingrix) 07/19/2018, 02/13/2019    External Notes reviewed: PCP and general surgery  Assessment:  Severe persistent eosinophilic asthma, well controlled Allergic Rhinitis, well controlled   Plan/Recommendations: Continue dupilumab injections. She is having some mild irritation/thrush from DPI, will transition to lower dose HFA with spacer to help with this. Continue gargling.   Continue treatment for allergic rhinitis with singulair and cetirizine.   Return to Care: Return in about 6 months (around 02/12/2022).   NLenice Llamas MD Pulmonary and [facility]

## 2021-08-21 ENCOUNTER — Other Ambulatory Visit (HOSPITAL_COMMUNITY): Payer: Self-pay

## 2021-08-25 ENCOUNTER — Encounter: Payer: Self-pay | Admitting: Internal Medicine

## 2021-08-25 ENCOUNTER — Other Ambulatory Visit (HOSPITAL_COMMUNITY): Payer: Self-pay

## 2021-08-25 ENCOUNTER — Other Ambulatory Visit: Payer: Self-pay

## 2021-08-25 ENCOUNTER — Ambulatory Visit (INDEPENDENT_AMBULATORY_CARE_PROVIDER_SITE_OTHER): Payer: 59 | Admitting: Internal Medicine

## 2021-08-25 VITALS — BP 138/78 | HR 63 | Temp 98.0°F | Ht 67.0 in | Wt 184.0 lb

## 2021-08-25 DIAGNOSIS — Z79899 Other long term (current) drug therapy: Secondary | ICD-10-CM | POA: Diagnosis not present

## 2021-08-25 DIAGNOSIS — I10 Essential (primary) hypertension: Secondary | ICD-10-CM

## 2021-08-25 DIAGNOSIS — B20 Human immunodeficiency virus [HIV] disease: Secondary | ICD-10-CM | POA: Diagnosis not present

## 2021-08-25 MED ORDER — BIKTARVY 50-200-25 MG PO TABS
1.0000 | ORAL_TABLET | Freq: Every day | ORAL | 11 refills | Status: DC
  Filled 2021-08-25: qty 30, 30d supply, fill #0
  Filled 2021-09-24: qty 30, 30d supply, fill #1
  Filled 2021-10-20: qty 30, 30d supply, fill #2
  Filled 2021-11-27: qty 30, 30d supply, fill #3
  Filled 2021-12-18: qty 30, 30d supply, fill #4
  Filled 2022-01-12: qty 30, 30d supply, fill #5
  Filled 2022-02-11: qty 30, 30d supply, fill #6
  Filled 2022-03-23: qty 30, 30d supply, fill #7
  Filled 2022-04-15: qty 30, 30d supply, fill #8
  Filled 2022-05-15: qty 30, 30d supply, fill #9
  Filled 2022-06-16: qty 30, 30d supply, fill #10
  Filled 2022-07-10: qty 30, 30d supply, fill #11

## 2021-08-25 NOTE — Progress Notes (Signed)
RFV: follow up for hiv disease  [name], female   DOB: 10-06-1950, 70 y.o.   MRN: [id number]  HPI 70yo F with well-controlled HIV disease, asthma, hypertension, who also suffers from left knee OA, seen by dr Ninfa Linden who gave recs on knee strengthening. Still does 7500 steps a day. She has excellent adherence with biktarvy. Doing well overall.   Outpatient Encounter Medications as of 08/25/2021  Medication Sig   albuterol (PROVENTIL) (2.5 MG/3ML) 0.083% nebulizer solution Take 3 mLs (2.5 mg total) by nebulization every 4 (four) hours as needed for wheezing or shortness of breath.   albuterol (VENTOLIN HFA) 108 (90 Base) MCG/ACT inhaler INHALE 2 PUFFS BY MOUTH INTO THE LUNGS EVERY 4 HOURS AS NEEDED   ascorbic acid (VITAMIN C) 500 MG tablet Take by mouth.   bictegravir-emtricitabine-tenofovir AF (BIKTARVY) 50-200-25 MG TABS tablet TAKE 1 TABLET BY MOUTH DAILY.   Calcium Carbonate (CALCIUM 600 PO) Take 1 tablet by mouth daily.    cetirizine (ZYRTEC) 10 MG tablet TAKE 1 TABLET (10 MG TOTAL) BY MOUTH DAILY.   Dupilumab (DUPIXENT) 300 MG/2ML SOPN Inject 300 mg into the skin every 14 (fourteen) days.   ELDERBERRY PO Take by mouth.   enalapril-hydrochlorothiazide (VASERETIC) 10-25 MG tablet TAKE 1 TABLET BY MOUTH ONCE DAILY   metoprolol tartrate (LOPRESSOR) 100 MG tablet TAKE 1 TABLET BY MOUTH ONCE DAILY   montelukast (SINGULAIR) 10 MG tablet TAKE 1 TABLET (10 MG TOTAL) BY MOUTH AT BEDTIME.   Multiple Vitamins-Minerals (MULTIVITAMIN GUMMIES ADULT PO) Take 2 tablets by mouth daily. Gummy   Multiple Vitamins-Minerals (ZINC PO) Take by mouth.   pravastatin (PRAVACHOL) 20 MG tablet TAKE 1 TABLET BY MOUTH ONCE DAILY   sertraline (ZOLOFT) 25 MG tablet TAKE 1 TABLET (25 MG TOTAL) BY MOUTH DAILY.   traZODone (DESYREL) 50 MG tablet Take 1/2-1 tablet (25-50 mg total) by mouth at bedtime as needed for sleep.   vitamin B-12 (CYANOCOBALAMIN) 500 MCG tablet Take 500 mcg by mouth daily.    VITAMIN D PO Take 1 capsule by mouth daily.   Blood Pressure Monitoring (BLOOD PRESSURE CUFF) MISC 1 Device by Does not apply route daily. DX: I10 (Patient not taking: Reported on 08/25/2021)   fluticasone-salmeterol (ADVAIR HFA) 115-21 MCG/ACT inhaler Inhale 1 puff into the lungs 2 (two) times daily. (Patient not taking: Reported on 08/25/2021)   Spacer/Aero-Holding Josiah Lobo DEVI Use with advair HFA   [DISCONTINUED] linaclotide (LINZESS) 145 MCG CAPS capsule Take 1 capsule (145 mcg total) by mouth daily before breakfast.   Facility-Administered Encounter Medications as of 08/25/2021  Medication   0.9 %  sodium chloride infusion     Patient Active Problem List   Diagnosis Date Noted   Depression, major, single episode, complete remission ([place]) 03/28/2021   Mixed hyperlipidemia 03/28/2021   Primary insomnia 03/28/2021   Depression, major, single episode, moderate ([place]) 03/28/2021   Chronic idiopathic constipation 03/28/2021   Seasonal allergies 01/13/2018   Asthma 02/05/2017   Allergy 12/16/2016   Age related osteoporosis 08/19/2016   Essential hypertension 08/19/2016   Human immunodeficiency virus (HIV) disease ([place]) 08/19/2016   Hx of adenomatous polyp of colon 2015   Social History   Tobacco Use   Smoking status: Former    Packs/day: 1.00    Years: 14.00    Pack years: 14.00    Types: Cigarettes    Quit date: 09/14/1978    Years since quitting: 42.9   Smokeless tobacco: Never  Vaping Use   Vaping Use:  Never used  Substance Use Topics   Alcohol use: No   Drug use: No     There are no preventive care reminders to display for this patient.   Review of Systems Review of Systems  Constitutional: Negative for fever, chills, diaphoresis, activity change, appetite change, fatigue and unexpected weight change.  HENT: Negative for congestion, sore throat, rhinorrhea, sneezing, trouble swallowing and sinus pressure.  Eyes: Negative for photophobia and visual disturbance.   Respiratory: Negative for cough, chest tightness, shortness of breath, wheezing and stridor.  Cardiovascular: Negative for chest pain, palpitations and leg swelling.  Gastrointestinal: Negative for nausea, vomiting, abdominal pain, diarrhea, constipation, blood in stool, abdominal distention and anal bleeding.  Genitourinary: Negative for dysuria, hematuria, flank pain and difficulty urinating.  Musculoskeletal: Negative for myalgias, back pain, joint swelling, arthralgias and gait problem.  Skin: Negative for color change, pallor, rash and wound.  Neurological: Negative for dizziness, tremors, weakness and light-headedness.  Hematological: Negative for adenopathy. Does not bruise/bleed easily.  Psychiatric/Behavioral: Negative for behavioral problems, confusion, sleep disturbance, dysphoric mood, decreased concentration and agitation.   Physical Exam   BP 138/78   Pulse 63   Temp 98 F (36.7 C) (Oral)   Ht 5\' 7"  (1.702 m)   Wt 184 lb (83.5 kg)   SpO2 97%   BMI 28.82 kg/m   Physical Exam  Constitutional:  oriented to person, place, and time. appears well-developed and well-nourished. No distress.  HENT: [place]/AT, PERRLA, no scleral icterus Mouth/Throat: Oropharynx is clear and moist. No oropharyngeal exudate.  Cardiovascular: Normal rate, regular rhythm and normal heart sounds. Exam reveals no gallop and no friction rub.  No murmur heard.  Pulmonary/Chest: Effort normal and breath sounds normal. No respiratory distress.  has no wheezes.  Neck = supple, no nuchal rigidity Abdominal: Soft. Bowel sounds are normal.  exhibits no distension. There is no tenderness.  Lymphadenopathy: no cervical adenopathy. No axillary adenopathy Neurological: alert and oriented to person, place, and time.  Skin: Skin is warm and dry. No rash noted. No erythema.  Psychiatric: a normal mood and affect.  behavior is normal.   Lab Results  Component Value Date   CD4TCELL 19 (L) 01/28/2021   Lab Results   Component Value Date   CD4TABS 475 01/28/2021   CD4TABS 526 07/17/2020   CD4TABS 465 01/02/2020   Lab Results  Component Value Date   HIV1RNAQUANT Not Detected 01/28/2021   Lab Results  Component Value Date   HEPBSAB POS (A) 09/24/2016   Lab Results  Component Value Date   LABRPR NON-REACTIVE 07/17/2020    CBC Lab Results  Component Value Date   WBC 5.8 01/28/2021   RBC 4.20 01/28/2021   HGB 13.0 01/28/2021   HCT 38.7 01/28/2021   PLT 258 01/28/2021   MCV 92.1 01/28/2021   MCH 31.0 01/28/2021   MCHC 33.6 01/28/2021   RDW 13.1 01/28/2021   LYMPHSABS 2,535 01/28/2021   MONOABS 0.2 02/03/2020   EOSABS 261 01/28/2021    BMET Lab Results  Component Value Date   NA 143 01/28/2021   K 4.3 01/28/2021   CL 105 01/28/2021   CO2 34 (H) 01/28/2021   GLUCOSE 84 01/28/2021   BUN 12 01/28/2021   CREATININE 0.90 01/28/2021   CALCIUM 9.5 01/28/2021   GFRNONAA 64 01/28/2021   GFRAA 75 01/28/2021      Assessment and Plan Hiv disease= well controlled, continue biktarvy  Long term medication management = cr is stable  HTN = well controlled.  Denies hypotension.   Osteoarthritis= can use tylenol to help with symptoms  Health maintenance = uptodate on vaccines and screening

## 2021-08-26 ENCOUNTER — Other Ambulatory Visit (HOSPITAL_COMMUNITY): Payer: Self-pay

## 2021-08-26 LAB — T-HELPER CELL (CD4) - (RCID CLINIC ONLY)
CD4 % Helper T Cell: 18 % — ABNORMAL LOW (ref 33–65)
CD4 T Cell Abs: 620 /uL (ref 400–1790)

## 2021-08-27 LAB — COMPLETE METABOLIC PANEL WITH GFR
AG Ratio: 1.7 (calc) (ref 1.0–2.5)
ALT: 13 U/L (ref 6–29)
AST: 16 U/L (ref 10–35)
Albumin: 4 g/dL (ref 3.6–5.1)
Alkaline phosphatase (APISO): 51 U/L (ref 37–153)
BUN: 17 mg/dL (ref 7–25)
CO2: 35 mmol/L — ABNORMAL HIGH (ref 20–32)
Calcium: 10.1 mg/dL (ref 8.6–10.4)
Chloride: 101 mmol/L (ref 98–110)
Creat: 0.98 mg/dL (ref 0.60–1.00)
Globulin: 2.4 g/dL (calc) (ref 1.9–3.7)
Glucose, Bld: 90 mg/dL (ref 65–99)
Potassium: 4 mmol/L (ref 3.5–5.3)
Sodium: 141 mmol/L (ref 135–146)
Total Bilirubin: 0.9 mg/dL (ref 0.2–1.2)
Total Protein: 6.4 g/dL (ref 6.1–8.1)
eGFR: 61 mL/min/{1.73_m2} (ref >=60)

## 2021-08-27 LAB — CBC WITH DIFFERENTIAL/PLATELET
Absolute Monocytes: 639 cells/uL (ref 200–950)
Basophils Absolute: 77 cells/uL (ref 0–200)
Basophils Relative: 1 %
Eosinophils Absolute: 347 cells/uL (ref 15–500)
Eosinophils Relative: 4.5 %
HCT: 41.1 % (ref 35.0–45.0)
Hemoglobin: 13.9 g/dL (ref 11.7–15.5)
Lymphs Abs: 3665 cells/uL (ref 850–3900)
MCH: 31.3 pg (ref 27.0–33.0)
MCHC: 33.8 g/dL (ref 32.0–36.0)
MCV: 92.6 fL (ref 80.0–100.0)
MPV: 10 fL (ref 7.5–12.5)
Monocytes Relative: 8.3 %
Neutro Abs: 2972 cells/uL (ref 1500–7800)
Neutrophils Relative %: 38.6 %
Platelets: 257 10*3/uL (ref 140–400)
RBC: 4.44 10*6/uL (ref 3.80–5.10)
RDW: 13.1 % (ref 11.0–15.0)
Total Lymphocyte: 47.6 %
WBC: 7.7 10*3/uL (ref 3.8–10.8)

## 2021-08-27 LAB — HIV-1 RNA QUANT-NO REFLEX-BLD
HIV 1 RNA Quant: NOT DETECTED Copies/mL
HIV-1 RNA Quant, Log: NOT DETECTED Log cps/mL

## 2021-09-18 ENCOUNTER — Other Ambulatory Visit (HOSPITAL_COMMUNITY): Payer: Self-pay

## 2021-09-24 ENCOUNTER — Other Ambulatory Visit (HOSPITAL_COMMUNITY): Payer: Self-pay

## 2021-09-26 ENCOUNTER — Other Ambulatory Visit (HOSPITAL_BASED_OUTPATIENT_CLINIC_OR_DEPARTMENT_OTHER): Payer: Self-pay

## 2021-10-13 ENCOUNTER — Other Ambulatory Visit (HOSPITAL_BASED_OUTPATIENT_CLINIC_OR_DEPARTMENT_OTHER): Payer: Self-pay

## 2021-10-13 ENCOUNTER — Telehealth: Payer: Self-pay | Admitting: Internal Medicine

## 2021-10-20 ENCOUNTER — Other Ambulatory Visit (HOSPITAL_COMMUNITY): Payer: Self-pay

## 2021-10-21 ENCOUNTER — Other Ambulatory Visit (HOSPITAL_COMMUNITY): Payer: Self-pay

## 2021-10-22 ENCOUNTER — Telehealth: Payer: Self-pay

## 2021-10-22 ENCOUNTER — Other Ambulatory Visit (HOSPITAL_COMMUNITY): Payer: Self-pay

## 2021-10-22 NOTE — Telephone Encounter (Signed)
Pt reached out and stated that she needed to re-enroll into [place] for 2023. Will submit renewal PA through insurances, she has both Medicare and commercial insurance through [employer].  Received notification from  [place]  regarding a prior authorization for [place]. Authorization has been APPROVED from 10/21/2021 to 10/21/2022.    CMM KeyLeonie Douglas Authorization # [id number]    Submitted a Prior Authorization request to [facility] for [place] via CoverMyMeds. Will update once we receive a response.  Key: [id number]

## 2021-10-22 NOTE — Telephone Encounter (Signed)
Called patient and advised that she will need to complete the 2023 re-enrollment form for Dupixent. She found form while I was on the phone with her today. She will plan to complete and return to [place] via fax  Knox Saliva, PharmD, MPH, BCPS Clinical Pharmacist (Rheumatology and Pulmonology)

## 2021-10-24 ENCOUNTER — Other Ambulatory Visit (HOSPITAL_COMMUNITY): Payer: Self-pay

## 2021-10-24 NOTE — Telephone Encounter (Signed)
Called patient and advised that she will need to complete the 2023 re-enrollment form for Dupixent. She found form while I was on the phone with her today. She will plan to complete and return to [place] via fax  Knox Saliva, PharmD, MPH, BCPS Clinical Pharmacist (Rheumatology and Pulmonology)

## 2021-10-27 ENCOUNTER — Other Ambulatory Visit: Payer: Self-pay | Admitting: Nurse Practitioner

## 2021-10-27 ENCOUNTER — Other Ambulatory Visit (HOSPITAL_BASED_OUTPATIENT_CLINIC_OR_DEPARTMENT_OTHER): Payer: Self-pay

## 2021-10-27 MED ORDER — PRAVASTATIN SODIUM 20 MG PO TABS
ORAL_TABLET | Freq: Every day | ORAL | 3 refills | Status: DC
  Filled 2021-10-27: qty 90, 90d supply, fill #0
  Filled 2022-05-20: qty 90, 90d supply, fill #1

## 2021-10-27 NOTE — Telephone Encounter (Signed)
Received notification from [facility] regarding a prior authorization for [place]. Authorization has been APPROVED from 10/24/2021 to 02/19/2022. Approval letter sent to scan center.  Authorization # [id number]   Test claim run using both Medicare and [employer] revealed that patient would still have a $200+ copay despite the use of two insurances. In light of this, we will disregard the fact that the patient has a secondary insurance and proceed with PAP assistance through [place] as a Medicare part D patient.  Will await return of patient PAP application.

## 2021-10-29 ENCOUNTER — Other Ambulatory Visit: Payer: Self-pay | Admitting: Internal Medicine

## 2021-10-29 DIAGNOSIS — J4550 Severe persistent asthma, uncomplicated: Secondary | ICD-10-CM

## 2021-10-30 ENCOUNTER — Other Ambulatory Visit: Payer: Self-pay | Admitting: Pharmacist

## 2021-10-30 ENCOUNTER — Other Ambulatory Visit: Payer: Self-pay | Admitting: Nurse Practitioner

## 2021-10-30 ENCOUNTER — Other Ambulatory Visit (HOSPITAL_BASED_OUTPATIENT_CLINIC_OR_DEPARTMENT_OTHER): Payer: Self-pay

## 2021-10-30 DIAGNOSIS — I10 Essential (primary) hypertension: Secondary | ICD-10-CM

## 2021-10-30 DIAGNOSIS — J4550 Severe persistent asthma, uncomplicated: Secondary | ICD-10-CM

## 2021-10-30 MED ORDER — ENALAPRIL-HYDROCHLOROTHIAZIDE 10-25 MG PO TABS
1.0000 | ORAL_TABLET | Freq: Every day | ORAL | 2 refills | Status: DC
  Filled 2021-10-30 (×2): qty 90, 90d supply, fill #0
  Filled 2022-02-02: qty 90, 90d supply, fill #1

## 2021-10-30 MED ORDER — DUPIXENT 300 MG/2ML ~~LOC~~ SOAJ: 300.0000 mg | SUBCUTANEOUS | 1 refills | Status: DC

## 2021-10-30 NOTE — Telephone Encounter (Signed)
Refill sent for [place] to [facility]: [phone]  Dose: 300 mg SQ every 2 weeks  Last OV: 08/14/21 Provider: Dr. Shearon Stalls  Next OV: June 2023, not yet scheduled  Knox Saliva, PharmD, MPH, BCPS Clinical Pharmacist (Rheumatology and Pulmonology)

## 2021-11-13 ENCOUNTER — Other Ambulatory Visit (HOSPITAL_COMMUNITY): Payer: Self-pay

## 2021-11-20 ENCOUNTER — Other Ambulatory Visit (HOSPITAL_BASED_OUTPATIENT_CLINIC_OR_DEPARTMENT_OTHER): Payer: Self-pay

## 2021-11-26 ENCOUNTER — Other Ambulatory Visit (HOSPITAL_BASED_OUTPATIENT_CLINIC_OR_DEPARTMENT_OTHER): Payer: Self-pay

## 2021-11-27 ENCOUNTER — Other Ambulatory Visit (HOSPITAL_COMMUNITY): Payer: Self-pay

## 2021-11-28 ENCOUNTER — Telehealth (INDEPENDENT_AMBULATORY_CARE_PROVIDER_SITE_OTHER): Payer: PPO | Admitting: Family

## 2021-11-28 ENCOUNTER — Encounter: Payer: Self-pay | Admitting: Family

## 2021-11-28 ENCOUNTER — Other Ambulatory Visit (HOSPITAL_BASED_OUTPATIENT_CLINIC_OR_DEPARTMENT_OTHER): Payer: Self-pay

## 2021-11-28 ENCOUNTER — Other Ambulatory Visit: Payer: Self-pay

## 2021-11-28 DIAGNOSIS — J40 Bronchitis, not specified as acute or chronic: Secondary | ICD-10-CM

## 2021-11-28 MED ORDER — PREDNISONE 20 MG PO TABS
ORAL_TABLET | ORAL | 0 refills | Status: AC
  Filled 2021-11-28: qty 5, 4d supply, fill #0

## 2021-11-28 NOTE — Progress Notes (Signed)
?This service is provided via telemedicine ? ?No vital signs collected/recorded due to the encounter was a telemedicine visit.  ? ?Location of patient (ex: home, work):  Home ? ?Patient consents to a telephone visit:  Yes ? ?Location of the provider (ex: office, home):  [employer].  ? ?Name of any referring provider:  Lauree Chandler, NP  ? ?Names of all persons participating in the telemedicine service and their role in the encounter:  Patient, Maria Adkins, Lott, [place], [place], NP.   ? ?Time spent on call: 8 minutes spent on the phone with Medical Assistant.   ? ? ? ?Provider: Marlowe Sax FNP-C ? ?Lauree Chandler, NP ? ?Patient Care Team: ?Lauree Chandler, NP as PCP - General (Geriatric Medicine) ? ?Extended Emergency Contact Information ?Primary Emergency Contact: Montes,Guillermo ?Address: [address] ?         [place], [place] [address] [place] ?Home Phone: [phone] ?Relation: Spouse ?Secondary Emergency Contact: Stearns,Rushdan ?Address: [address] ?         [place], [place] [address] [place] ?Home Phone: [phone] ?Mobile Phone: [phone] ?Relation: Son ? ?Code Status:  Full Code  ?Goals of care: Advanced Directive information ?Advanced Directives 11/28/2021  ?Does Patient Have a Medical Advance Directive? No  ?Type of Advance Directive -  ?Does patient want to make changes to medical advance directive? -  ?Copy of [place] in Chart? -  ?Would patient like information on creating a medical advance directive? No - Patient declined  ? ? ? ?Chief Complaint  ?Patient presents with  ? Acute Visit  ?  Patient complains of cough, wheezing, and chest congestion. Covid test was negative. Symptoms started 11/17/2021.  ? ? ?HPI:  ?Pt is a 71 y.o. female seen today for an acute visit for evaluation of cough wheezing and chest congestion for 12 days.  COVID test was negative.cough up yellowish phlegm.she lost her voice on the 8th then  it came back. She denies any fever,chills,fatigue,body aches,runny nose,chest tightness,chest pain,palpitation or shortness of breath. ?Has taken dyslm and mucus relief.she stopped mucus relief since it was making her harder to breath.  ?Construction and painting being done in her house  ?Has taken prednisone in the past which seems to work better for her.  ? ? ?Past Medical History:  ?Diagnosis Date  ? Allergy   ? seasonal allergies  ? Asthma   ? uses unhaler  ? Blood transfusion without reported diagnosis 1990's  ? Cataract   ? bilateral sx  ? Depression   ? on meds  ? High blood pressure   ? on meds  ? HIV (human immunodeficiency virus infection) ([place])   ? on meds  ? Hx of adenomatous polyp of colon 2015  ? Hyperlipidemia   ? on meds  ? Osteoarthritis   ? Osteopenia   ? ?Past Surgical History:  ?Procedure Laterality Date  ? Biopsy of Liver    ? CATARACT EXTRACTION, BILATERAL    ? CHOLECYSTECTOMY    ? COLONOSCOPY  2015  ? MAC-Dr.Liakos-prep(exc)-TA x 1  ? CYST REMOVAL HAND Left   ? CYSTECTOMY    ? off of back  ? LAPAROSCOPIC SALPINGOOPHERECTOMY Right   ? POLYPECTOMY  2015  ? TA  ? ? ?No Known Allergies ? ?Outpatient Encounter Medications as of 11/28/2021  ?Medication Sig  ? albuterol (PROVENTIL) (2.5 MG/3ML) 0.083% nebulizer solution Take 3 mLs (2.5 mg total) by nebulization every 4 (four) hours  as needed for wheezing or shortness of breath.  ? albuterol (VENTOLIN HFA) 108 (90 Base) MCG/ACT inhaler INHALE 2 PUFFS BY MOUTH INTO THE LUNGS EVERY 4 HOURS AS NEEDED  ? ascorbic acid (VITAMIN C) 500 MG tablet Take by mouth.  ? bictegravir-emtricitabine-tenofovir AF (BIKTARVY) 50-200-25 MG TABS tablet TAKE 1 TABLET BY MOUTH DAILY.  ? Blood Pressure Monitoring (BLOOD PRESSURE CUFF) MISC 1 Device by Does not apply route daily. DX: I10  ? Calcium Carbonate (CALCIUM 600 PO) Take 1 tablet by mouth daily.   ? cetirizine (ZYRTEC) 10 MG tablet TAKE 1 TABLET (10 MG TOTAL) BY MOUTH DAILY.  ? Dupilumab (DUPIXENT) 300 MG/2ML SOPN  Inject 300 mg into the skin every 14 (fourteen) days.  ? ELDERBERRY PO Take by mouth.  ? enalapril-hydrochlorothiazide (VASERETIC) 10-25 MG tablet TAKE 1 TABLET BY MOUTH ONCE DAILY  ? fluticasone-salmeterol (ADVAIR HFA) 115-21 MCG/ACT inhaler Inhale 1 puff into the lungs 2 (two) times daily.  ? metoprolol tartrate (LOPRESSOR) 100 MG tablet TAKE 1 TABLET BY MOUTH ONCE DAILY  ? montelukast (SINGULAIR) 10 MG tablet TAKE 1 TABLET (10 MG TOTAL) BY MOUTH AT BEDTIME.  ? Multiple Vitamins-Minerals (MULTIVITAMIN GUMMIES ADULT PO) Take 2 tablets by mouth daily. Gummy  ? Multiple Vitamins-Minerals (ZINC PO) Take by mouth.  ? pravastatin (PRAVACHOL) 20 MG tablet TAKE 1 TABLET BY MOUTH ONCE DAILY  ? sertraline (ZOLOFT) 25 MG tablet TAKE 1 TABLET (25 MG TOTAL) BY MOUTH DAILY.  ? Spacer/Aero-Holding Dorise Bullion Use with advair HFA  ? traZODone (DESYREL) 50 MG tablet Take 1/2-1 tablet (25-50 mg total) by mouth at bedtime as needed for sleep.  ? vitamin B-12 (CYANOCOBALAMIN) 500 MCG tablet Take 500 mcg by mouth daily.  ? VITAMIN D PO Take 1 capsule by mouth daily.  ? [DISCONTINUED] linaclotide (LINZESS) 145 MCG CAPS capsule Take 1 capsule (145 mcg total) by mouth daily before breakfast.  ? ?Facility-Administered Encounter Medications as of 11/28/2021  ?Medication  ? 0.9 %  sodium chloride infusion  ? ? ?Review of Systems  ?Constitutional:  Negative for appetite change, chills, fatigue, fever and unexpected weight change.  ?HENT:  Negative for congestion, dental problem, hearing loss, postnasal drip, rhinorrhea, sinus pressure, sinus pain, sneezing and sore throat.   ?Eyes:  Negative for pain, discharge, redness, itching and visual disturbance.  ?Respiratory:  Positive for cough and wheezing. Negative for chest tightness and shortness of breath.   ?Cardiovascular:  Negative for chest pain, palpitations and leg swelling.  ?Gastrointestinal:  Negative for abdominal distention, abdominal pain, constipation, diarrhea, nausea and  vomiting.  ?Musculoskeletal:  Negative for arthralgias, back pain, gait problem, joint swelling and myalgias.  ?Skin:  Negative for color change, pallor and rash.  ?Neurological:  Negative for dizziness, speech difficulty, weakness, light-headedness, numbness and headaches.  ? ?Immunization History  ?Administered Date(s) Administered  ? Fluad Quad(high Dose 65+) 05/16/2019, 05/31/2020, 06/06/2021  ? Influenza,inj,Quad PF,6+ Mos 06/17/2017, 06/09/2018  ? Influenza-Unspecified 06/11/2016  ? Moderna Covid-19 Vaccine Bivalent Booster 31yr & up 07/12/2021  ? Moderna Sars-Covid-2 Vaccination 07/06/2020  ? PFIZER(Purple Top)SARS-COV-2 Vaccination 10/20/2019, 11/14/2019  ? Pneumococcal Conjugate-13 05/20/2018  ? Pneumococcal Polysaccharide-23 02/15/2017  ? Tdap 09/21/2017  ? Zoster Recombinat (Shingrix) 07/19/2018, 02/13/2019  ? ?Pertinent  Health Maintenance Due  ?Topic Date Due  ? MAMMOGRAM  05/30/2023  ? COLONOSCOPY (Pts 45-459yrInsurance coverage will need to be confirmed)  12/17/2027  ? INFLUENZA VACCINE  Completed  ? DEXA SCAN  Completed  ? ?Fall Risk 02/19/2021 03/28/2021 08/01/2021 08/25/2021  11/28/2021  ?Falls in the past year? 0 0 0 0 0  ?Was there an injury with Fall? - 0 0 - 0  ?Fall Risk Category Calculator - 0 0 - 0  ?Fall Risk Category - Low Low - Low  ?Patient Fall Risk Level _0   ?Patient at Risk for Falls Due to - No Fall Risks No Fall Risks No Fall Risks No Fall Risks  ?Fall risk Follow up _1   ? ?Functional Status Survey: ?  ? ?There were no vitals filed for this visit. ?There is no height or weight on file to calculate BMI. ?Physical Exam ?Constitutional:   ?   General: She is not in acute distress. ?   Appearance: She is not ill-appearing.  ?Pulmonary:  ?   Effort: Pulmonary effort is normal. No respiratory  distress.  ?Neurological:  ?   Mental Status: She is alert and oriented to person, place, and time.  ?Psychiatric:     ?   Mood and Affect: Mood normal.     ?   Behavior: Behavior normal.  ? ? ?Labs reviewed: ?Rec

## 2021-12-10 ENCOUNTER — Other Ambulatory Visit (HOSPITAL_BASED_OUTPATIENT_CLINIC_OR_DEPARTMENT_OTHER): Payer: Self-pay

## 2021-12-18 ENCOUNTER — Other Ambulatory Visit (HOSPITAL_COMMUNITY): Payer: Self-pay

## 2021-12-24 ENCOUNTER — Other Ambulatory Visit (HOSPITAL_COMMUNITY): Payer: Self-pay

## 2022-01-12 ENCOUNTER — Other Ambulatory Visit (HOSPITAL_COMMUNITY): Payer: Self-pay

## 2022-01-13 ENCOUNTER — Other Ambulatory Visit (HOSPITAL_BASED_OUTPATIENT_CLINIC_OR_DEPARTMENT_OTHER): Payer: Self-pay

## 2022-01-19 ENCOUNTER — Ambulatory Visit (INDEPENDENT_AMBULATORY_CARE_PROVIDER_SITE_OTHER): Payer: PPO | Admitting: Nurse Practitioner

## 2022-01-19 ENCOUNTER — Encounter: Payer: Self-pay | Admitting: Nurse Practitioner

## 2022-01-19 VITALS — BP 130/90 | HR 62 | Temp 97.0°F | Ht 67.0 in

## 2022-01-19 DIAGNOSIS — J302 Other seasonal allergic rhinitis: Secondary | ICD-10-CM | POA: Diagnosis not present

## 2022-01-19 DIAGNOSIS — K5904 Chronic idiopathic constipation: Secondary | ICD-10-CM | POA: Diagnosis not present

## 2022-01-19 DIAGNOSIS — J4550 Severe persistent asthma, uncomplicated: Secondary | ICD-10-CM | POA: Diagnosis not present

## 2022-01-19 DIAGNOSIS — F321 Major depressive disorder, single episode, moderate: Secondary | ICD-10-CM | POA: Diagnosis not present

## 2022-01-19 DIAGNOSIS — M8580 Other specified disorders of bone density and structure, unspecified site: Secondary | ICD-10-CM | POA: Diagnosis not present

## 2022-01-19 DIAGNOSIS — H6121 Impacted cerumen, right ear: Secondary | ICD-10-CM

## 2022-01-19 DIAGNOSIS — E782 Mixed hyperlipidemia: Secondary | ICD-10-CM

## 2022-01-19 DIAGNOSIS — B20 Human immunodeficiency virus [HIV] disease: Secondary | ICD-10-CM | POA: Diagnosis not present

## 2022-01-19 DIAGNOSIS — I10 Essential (primary) hypertension: Secondary | ICD-10-CM | POA: Diagnosis not present

## 2022-01-19 NOTE — Patient Instructions (Signed)
Chia seed pudding- 2 tablespoons chia seeds with 1/2 cup almond milk- small amount of honey and fruit of choice.  ?Put it in fridge 2-3 hours or overnight to help with constipation  ?

## 2022-01-19 NOTE — Addendum Note (Signed)
Addended by: Heriberto Antigua E on: 01/19/2022 12:11 PM ? ? Modules accepted: Orders ? ?

## 2022-01-19 NOTE — Progress Notes (Signed)
? ? ?Careteam: ?Patient Care Team: ?Lauree Chandler, NP as PCP - General (Geriatric Medicine) ? ?PLACE OF SERVICE:  ?[facility]  ?Advanced Directive information ?Does Patient Have a Medical Advance Directive?: No ? ?No Known Allergies ? ?Chief Complaint  ?Patient presents with  ? Medical Management of Chronic Issues  ?  Routine follow up.  ? ? ? ?HPI: Patient is a 71 y.o. female for routine follow up.  ? ? ?Osteopenia- bone density up to date, continues on cal and vit d ? ?Seasonal allergies/asthma- flared up when pollen season started. She did a telephone visit with dinah and started on prednisone because her asthma was flaring up.  ?Continues on dupixent injection twice monthly with singulair and advair.  ? ?Insomnia- sleeping well. ? ?Depression- controlled on zoloft.  ? ?Hyperlipidemia- on pravachol- due for lab work.  ? ?Htn- controlled on enalapril-hctz  ? ?Review of Systems:  ?Review of Systems  ?Constitutional:  Negative for chills, fever and weight loss.  ?HENT:  Negative for tinnitus.   ?Respiratory:  Negative for cough, sputum production and shortness of breath.   ?Cardiovascular:  Negative for chest pain, palpitations and leg swelling.  ?Gastrointestinal:  Negative for abdominal pain, constipation, diarrhea and heartburn.  ?Genitourinary:  Negative for dysuria, frequency and urgency.  ?Musculoskeletal:  Negative for back pain, falls, joint pain and myalgias.  ?Skin: Negative.   ?Neurological:  Negative for dizziness and headaches.  ?Psychiatric/Behavioral:  Negative for depression and memory loss. The patient does not have insomnia.   ? ?Past Medical History:  ?Diagnosis Date  ? Allergy   ? seasonal allergies  ? Asthma   ? uses unhaler  ? Blood transfusion without reported diagnosis 1990's  ? Cataract   ? bilateral sx  ? Depression   ? on meds  ? High blood pressure   ? on meds  ? HIV (human immunodeficiency virus infection) ([place])   ? on meds  ? Hx of adenomatous polyp of colon 2015  ? Hyperlipidemia    ? on meds  ? Osteoarthritis   ? Osteopenia   ? ?Past Surgical History:  ?Procedure Laterality Date  ? Biopsy of Liver    ? CATARACT EXTRACTION, BILATERAL    ? CHOLECYSTECTOMY    ? COLONOSCOPY  2015  ? [facility]-Dr.Liakos-prep(exc)-TA x 1  ? CYST REMOVAL HAND Left   ? CYSTECTOMY    ? off of back  ? LAPAROSCOPIC SALPINGOOPHERECTOMY Right   ? POLYPECTOMY  2015  ? TA  ? ?Social History: ?  reports that she quit smoking about 43 years ago. Her smoking use included cigarettes. She has a 14.00 pack-year smoking history. She has never used smokeless tobacco. She reports that she does not drink alcohol and does not use drugs. ? ?Family History  ?Problem Relation Age of Onset  ? Diabetes Mother   ?     died at age 32  ? Hypertension Mother   ? Asthma Sister   ? Diabetes Sister   ? Hypertension Sister   ? Arthritis Sister   ? Diabetes Sister   ? Hypertension Sister   ? Asthma Sister   ? Arthritis Sister   ? Heart defect Sister   ? Asthma Son   ?     controlled  ? Crohn's disease Niece   ? Colon polyps Neg Hx   ? Colon cancer Neg Hx   ? Esophageal cancer Neg Hx   ? Stomach cancer Neg Hx   ? ? ?Medications: ?Patient's Medications  ?  New Prescriptions  ? No medications on file  ?Previous Medications  ? ALBUTEROL (PROVENTIL) (2.5 MG/3ML) 0.083% NEBULIZER SOLUTION    Take 3 mLs (2.5 mg total) by nebulization every 4 (four) hours as needed for wheezing or shortness of breath.  ? ALBUTEROL (VENTOLIN HFA) 108 (90 BASE) MCG/ACT INHALER    INHALE 2 PUFFS BY MOUTH INTO THE LUNGS EVERY 4 HOURS AS NEEDED  ? ASCORBIC ACID (VITAMIN C) 500 MG TABLET    Take by mouth.  ? BICTEGRAVIR-EMTRICITABINE-TENOFOVIR AF (BIKTARVY) 50-200-25 MG TABS TABLET    TAKE 1 TABLET BY MOUTH DAILY.  ? BIOTIN W/ VITAMINS C & E (HAIR/SKIN/NAILS PO)    Take by mouth.  ? BLOOD PRESSURE MONITORING (BLOOD PRESSURE CUFF) MISC    1 Device by Does not apply route daily. DX: I10  ? CALCIUM CARBONATE (CALCIUM 600 PO)    Take 1 tablet by mouth daily.   ? CETIRIZINE (ZYRTEC) 10 MG  TABLET    TAKE 1 TABLET (10 MG TOTAL) BY MOUTH DAILY.  ? CHOLECALCIFEROL (VITAMIN D) 125 MCG (5000 UT) CAPS    Take by mouth.  ? DUPILUMAB (DUPIXENT) 300 MG/2ML SOPN    Inject 300 mg into the skin every 14 (fourteen) days.  ? ELDERBERRY PO    Take by mouth.  ? ENALAPRIL-HYDROCHLOROTHIAZIDE (VASERETIC) 10-25 MG TABLET    TAKE 1 TABLET BY MOUTH ONCE DAILY  ? FLUTICASONE-SALMETEROL (ADVAIR HFA) 115-21 MCG/ACT INHALER    Inhale 1 puff into the lungs 2 (two) times daily.  ? METOPROLOL TARTRATE (LOPRESSOR) 100 MG TABLET    TAKE 1 TABLET BY MOUTH ONCE DAILY  ? MONTELUKAST (SINGULAIR) 10 MG TABLET    TAKE 1 TABLET (10 MG TOTAL) BY MOUTH AT BEDTIME.  ? MULTIPLE VITAMINS-MINERALS (MULTIVITAMIN GUMMIES ADULT PO)    Take 2 tablets by mouth daily. Gummy  ? MULTIPLE VITAMINS-MINERALS (ZINC PO)    Take by mouth.  ? PRAVASTATIN (PRAVACHOL) 20 MG TABLET    TAKE 1 TABLET BY MOUTH ONCE DAILY  ? SERTRALINE (ZOLOFT) 25 MG TABLET    TAKE 1 TABLET (25 MG TOTAL) BY MOUTH DAILY.  ? SPACER/AERO-HOLDING CHAMBERS DEVI    Use with advair HFA  ? TRAZODONE (DESYREL) 50 MG TABLET    Take 1/2-1 tablet (25-50 mg total) by mouth at bedtime as needed for sleep.  ? VITAMIN B-12 (CYANOCOBALAMIN) 500 MCG TABLET    Take 500 mcg by mouth daily.  ?Modified Medications  ? No medications on file  ?Discontinued Medications  ? VITAMIN D PO    Take 1 capsule by mouth daily.  ? ? ?Physical Exam: ? ?There were no vitals filed for this visit. ?There is no height or weight on file to calculate BMI. ?Wt Readings from Last 3 Encounters:  ?08/25/21 184 lb (83.5 kg)  ?08/14/21 186 lb (84.4 kg)  ?06/16/21 187 lb (84.8 kg)  ? ? ?Physical Exam ?Constitutional:   ?   General: She is not in acute distress. ?   Appearance: She is well-developed. She is not diaphoretic.  ?HENT:  ?   Head: Normocephalic and atraumatic.  ?   Right Ear: There is impacted cerumen.  ?   Left Ear: There is no impacted cerumen.  ?   Mouth/Throat:  ?   Pharynx: No oropharyngeal exudate.  ?Eyes:  ?    Conjunctiva/sclera: Conjunctivae normal.  ?   Pupils: Pupils are equal, round, and reactive to light.  ?Cardiovascular:  ?   Rate and Rhythm: Normal rate  and regular rhythm.  ?   Heart sounds: Normal heart sounds.  ?Pulmonary:  ?   Effort: Pulmonary effort is normal.  ?   Breath sounds: Normal breath sounds.  ?Abdominal:  ?   General: Bowel sounds are normal.  ?   Palpations: Abdomen is soft.  ?Musculoskeletal:  ?   Cervical back: Normal range of motion and neck supple.  ?   Right lower leg: No edema.  ?   Left lower leg: No edema.  ?Skin: ?   General: Skin is warm and dry.  ?Neurological:  ?   Mental Status: She is alert.  ?Psychiatric:     ?   Mood and Affect: Mood normal.  ? ? ?Labs reviewed: ?Basic Metabolic Panel: ?Recent Labs  ?  01/28/21 ?0846 08/25/21 ?3382  ?NA 143 141  ?K 4.3 4.0  ?CL 105 101  ?CO2 34* 35*  ?GLUCOSE 84 90  ?BUN 12 17  ?CREATININE 0.90 0.98  ?CALCIUM 9.5 10.1  ? ?Liver Function Tests: ?Recent Labs  ?  01/28/21 ?0846 08/25/21 ?0922  ?AST 22 16  ?ALT 18 13  ?BILITOT 1.1 0.9  ?PROT 6.4 6.4  ? ?No results for input(s): LIPASE, AMYLASE in the last 8760 hours. ?No results for input(s): AMMONIA in the last 8760 hours. ?CBC: ?Recent Labs  ?  01/28/21 ?0846 08/25/21 ?0922  ?WBC 5.8 7.7  ?NEUTROABS 2,320 2,972  ?HGB 13.0 13.9  ?HCT 38.7 41.1  ?MCV 92.1 92.6  ?PLT 258 257  ? ?Lipid Panel: ?Recent Labs  ?  01/28/21 ?5053  ?CHOL 171  ?HDL 45*  ?LDLCALC 103*  ?TRIG 131  ?CHOLHDL 3.8  ? ?TSH: ?No results for input(s): TSH in the last 8760 hours. ?A1C: ?No results found for: HGBA1C ? ? ?Assessment/Plan ?1. Depression, major, single episode, moderate ([place]) ?-controlled on zoloft daily, continue lifestyle modifications as well.  ? ?2. Chronic idiopathic constipation ?Controlled with lifestyle modifications.  ? ?3. Essential hypertension ?-Blood pressure well controlled ?Continue current medications ?Recheck metabolic panel ?- CBC with Differential/Platelet ?- CMP with eGFR([facility]) ? ?4. Severe persistent  asthma, unspecified whether complicated ?-stable, continue current treatment.  ? ?5. Human immunodeficiency virus (HIV) disease ([place]) ?-stable followed by ID ? ?6. Mixed hyperlipidemia ?Continues on  ?- L

## 2022-01-20 LAB — CBC WITH DIFFERENTIAL/PLATELET
Absolute Monocytes: 655 cells/uL (ref 200–950)
Basophils Absolute: 77 cells/uL (ref 0–200)
Basophils Relative: 1 %
Eosinophils Absolute: 277 cells/uL (ref 15–500)
Eosinophils Relative: 3.6 %
HCT: 41.1 % (ref 35.0–45.0)
Hemoglobin: 14 g/dL (ref 11.7–15.5)
Lymphs Abs: 3658 cells/uL (ref 850–3900)
MCH: 31.7 pg (ref 27.0–33.0)
MCHC: 34.1 g/dL (ref 32.0–36.0)
MCV: 93 fL (ref 80.0–100.0)
MPV: 10.2 fL (ref 7.5–12.5)
Monocytes Relative: 8.5 %
Neutro Abs: 3034 cells/uL (ref 1500–7800)
Neutrophils Relative %: 39.4 %
Platelets: 261 10*3/uL (ref 140–400)
RBC: 4.42 10*6/uL (ref 3.80–5.10)
RDW: 13.2 % (ref 11.0–15.0)
Total Lymphocyte: 47.5 %
WBC: 7.7 10*3/uL (ref 3.8–10.8)

## 2022-01-20 LAB — COMPLETE METABOLIC PANEL WITH GFR
AG Ratio: 1.7 (calc) (ref 1.0–2.5)
ALT: 18 U/L (ref 6–29)
AST: 20 U/L (ref 10–35)
Albumin: 4.1 g/dL (ref 3.6–5.1)
Alkaline phosphatase (APISO): 55 U/L (ref 37–153)
BUN: 10 mg/dL (ref 7–25)
CO2: 32 mmol/L (ref 20–32)
Calcium: 9.6 mg/dL (ref 8.6–10.4)
Chloride: 101 mmol/L (ref 98–110)
Creat: 0.97 mg/dL (ref 0.60–1.00)
Globulin: 2.4 g/dL (calc) (ref 1.9–3.7)
Glucose, Bld: 87 mg/dL (ref 65–139)
Potassium: 3.8 mmol/L (ref 3.5–5.3)
Sodium: 139 mmol/L (ref 135–146)
Total Bilirubin: 0.8 mg/dL (ref 0.2–1.2)
Total Protein: 6.5 g/dL (ref 6.1–8.1)
eGFR: 62 mL/min/{1.73_m2} (ref >=60)

## 2022-01-20 LAB — LIPID PANEL
Cholesterol: 164 mg/dL (ref <200)
HDL: 42 mg/dL — ABNORMAL LOW (ref >=50)
LDL Cholesterol (Calc): 90 mg/dL (calc)
Non-HDL Cholesterol (Calc): 122 mg/dL (calc) (ref <130)
Total CHOL/HDL Ratio: 3.9 (calc) (ref <5.0)
Triglycerides: 218 mg/dL — ABNORMAL HIGH (ref <150)

## 2022-01-22 ENCOUNTER — Other Ambulatory Visit (HOSPITAL_COMMUNITY): Payer: Self-pay

## 2022-01-27 ENCOUNTER — Telehealth: Payer: Self-pay

## 2022-01-27 ENCOUNTER — Other Ambulatory Visit (HOSPITAL_BASED_OUTPATIENT_CLINIC_OR_DEPARTMENT_OTHER): Payer: Self-pay

## 2022-01-27 ENCOUNTER — Telehealth (INDEPENDENT_AMBULATORY_CARE_PROVIDER_SITE_OTHER): Payer: PPO | Admitting: Nurse Practitioner

## 2022-01-27 DIAGNOSIS — B351 Tinea unguium: Secondary | ICD-10-CM

## 2022-01-27 MED ORDER — TERBINAFINE HCL 250 MG PO TABS
250.0000 mg | ORAL_TABLET | Freq: Every day | ORAL | 0 refills | Status: DC
  Filled 2022-01-27: qty 90, 90d supply, fill #0

## 2022-01-27 NOTE — Progress Notes (Signed)
? ? ?Careteam: ?Patient Care Team: ?Lauree Chandler, NP as PCP - General (Geriatric Medicine) ? ?Advanced Directive information ?  ? ?No Known Allergies ? ?Chief Complaint  ?Patient presents with  ? Acute Visit  ?  Patient complains of nail fungus in toes on left foot. Patient has been using over the counter antifungal medication and using tea tree oil Tea tree oil helped some.  ? ? ? ?HPI: Patient is a 71 y.o. female due to toenail fungus.  ?She reports she has been using over the counter OTC. Has helped but not completely cleared up.  ?Her large toe came off and now coming back normal.  ?She has cracked, dark toenails on all other toes. She has been using OTC treatment for 6 months without success. No pain or infection around nails. ? ? ?Review of Systems:  ?Review of Systems  ?Constitutional: Negative.   ?HENT: Negative.    ?Respiratory: Negative.    ?Cardiovascular: Negative.   ?Skin: Negative.   ? ?Past Medical History:  ?Diagnosis Date  ? Allergy   ? seasonal allergies  ? Asthma   ? uses unhaler  ? Blood transfusion without reported diagnosis 1990's  ? Cataract   ? bilateral sx  ? Depression   ? on meds  ? High blood pressure   ? on meds  ? HIV (human immunodeficiency virus infection) ([place])   ? on meds  ? Hx of adenomatous polyp of colon 2015  ? Hyperlipidemia   ? on meds  ? Osteoarthritis   ? Osteopenia   ? ?Past Surgical History:  ?Procedure Laterality Date  ? Biopsy of Liver    ? CATARACT EXTRACTION, BILATERAL    ? CHOLECYSTECTOMY    ? COLONOSCOPY  2015  ? [facility]-Dr.Liakos-prep(exc)-TA x 1  ? CYST REMOVAL HAND Left   ? CYSTECTOMY    ? off of back  ? LAPAROSCOPIC SALPINGOOPHERECTOMY Right   ? POLYPECTOMY  2015  ? TA  ? ?Social History: ?  reports that she quit smoking about 43 years ago. Her smoking use included cigarettes. She has a 14.00 pack-year smoking history. She has never used smokeless tobacco. She reports that she does not drink alcohol and does not use drugs. ? ?Family History  ?Problem Relation  Age of Onset  ? Diabetes Mother   ?     died at age 44  ? Hypertension Mother   ? Asthma Sister   ? Diabetes Sister   ? Hypertension Sister   ? Arthritis Sister   ? Diabetes Sister   ? Hypertension Sister   ? Asthma Sister   ? Arthritis Sister   ? Heart defect Sister   ? Asthma Son   ?     controlled  ? Crohn's disease Niece   ? Colon polyps Neg Hx   ? Colon cancer Neg Hx   ? Esophageal cancer Neg Hx   ? Stomach cancer Neg Hx   ? ? ?Medications: ?Patient's Medications  ?New Prescriptions  ? No medications on file  ?Previous Medications  ? ALBUTEROL (PROVENTIL) (2.5 MG/3ML) 0.083% NEBULIZER SOLUTION    Take 3 mLs (2.5 mg total) by nebulization every 4 (four) hours as needed for wheezing or shortness of breath.  ? ALBUTEROL (VENTOLIN HFA) 108 (90 BASE) MCG/ACT INHALER    INHALE 2 PUFFS BY MOUTH INTO THE LUNGS EVERY 4 HOURS AS NEEDED  ? ASCORBIC ACID (VITAMIN C) 500 MG TABLET    Take by mouth.  ? BICTEGRAVIR-EMTRICITABINE-TENOFOVIR AF (BIKTARVY)  50-200-25 MG TABS TABLET    TAKE 1 TABLET BY MOUTH DAILY.  ? BIOTIN W/ VITAMINS C & E (HAIR/SKIN/NAILS PO)    Take by mouth.  ? BLOOD PRESSURE MONITORING (BLOOD PRESSURE CUFF) MISC    1 Device by Does not apply route daily. DX: I10  ? CALCIUM CARBONATE (CALCIUM 600 PO)    Take 1 tablet by mouth daily.   ? CETIRIZINE (ZYRTEC) 10 MG TABLET    TAKE 1 TABLET (10 MG TOTAL) BY MOUTH DAILY.  ? CHOLECALCIFEROL (VITAMIN D) 125 MCG (5000 UT) CAPS    Take by mouth.  ? DUPILUMAB (DUPIXENT) 300 MG/2ML SOPN    Inject 300 mg into the skin every 14 (fourteen) days.  ? ELDERBERRY PO    Take by mouth.  ? ENALAPRIL-HYDROCHLOROTHIAZIDE (VASERETIC) 10-25 MG TABLET    TAKE 1 TABLET BY MOUTH ONCE DAILY  ? FLUTICASONE-SALMETEROL (ADVAIR HFA) 115-21 MCG/ACT INHALER    Inhale 1 puff into the lungs 2 (two) times daily.  ? METOPROLOL TARTRATE (LOPRESSOR) 100 MG TABLET    TAKE 1 TABLET BY MOUTH ONCE DAILY  ? MONTELUKAST (SINGULAIR) 10 MG TABLET    TAKE 1 TABLET (10 MG TOTAL) BY MOUTH AT BEDTIME.  ?  MULTIPLE VITAMINS-MINERALS (MULTIVITAMIN GUMMIES ADULT PO)    Take 2 tablets by mouth daily. Gummy  ? MULTIPLE VITAMINS-MINERALS (ZINC PO)    Take by mouth.  ? PRAVASTATIN (PRAVACHOL) 20 MG TABLET    TAKE 1 TABLET BY MOUTH ONCE DAILY  ? SERTRALINE (ZOLOFT) 25 MG TABLET    TAKE 1 TABLET (25 MG TOTAL) BY MOUTH DAILY.  ? SPACER/AERO-HOLDING CHAMBERS DEVI    Use with advair HFA  ? TRAZODONE (DESYREL) 50 MG TABLET    Take 1/2-1 tablet (25-50 mg total) by mouth at bedtime as needed for sleep.  ? VITAMIN B-12 (CYANOCOBALAMIN) 500 MCG TABLET    Take 500 mcg by mouth daily.  ?Modified Medications  ? No medications on file  ?Discontinued Medications  ? No medications on file  ? ? ?Physical Exam: ? ?There were no vitals filed for this visit. ?There is no height or weight on file to calculate BMI. ?Wt Readings from Last 3 Encounters:  ?08/25/21 184 lb (83.5 kg)  ?08/14/21 186 lb (84.4 kg)  ?06/16/21 187 lb (84.8 kg)  ? ? ?Physical Exam ?Constitutional:   ?   Appearance: Normal appearance.  ?Pulmonary:  ?   Effort: Pulmonary effort is normal.  ?Neurological:  ?   Mental Status: She is alert. Mental status is at baseline.  ?Psychiatric:     ?   Mood and Affect: Mood normal.  ? ? ?Labs reviewed: ?Basic Metabolic Panel: ?Recent Labs  ?  01/28/21 ?0846 08/25/21 ?0922 01/19/22 ?5784  ?NA 143 141 139  ?K 4.3 4.0 3.8  ?CL 105 101 101  ?CO2 34* 35* 32  ?GLUCOSE 84 90 87  ?BUN _0 ?CREATININE 0.90 0.98 0.97  ?CALCIUM 9.5 10.1 9.6  ? ?Liver Function Tests: ?Recent Labs  ?  01/28/21 ?0846 08/25/21 ?0922 01/19/22 ?0950  ?AST _1 ?ALT _2 ?BILITOT 1.1 0.9 0.8  ?PROT 6.4 6.4 6.5  ? ?No results for input(s): LIPASE, AMYLASE in the last 8760 hours. ?No results for input(s): AMMONIA in the last 8760 hours. ?CBC: ?Recent Labs  ?  01/28/21 ?0846 08/25/21 ?0922 01/19/22 ?0950  ?WBC 5.8 7.7 7.7  ?NEUTROABS 2,320 2,972 3,034  ?HGB 13.0 13.9 14.0  ?HCT 38.7 41.1  41.1  ?MCV 92.1 92.6 93.0  ?PLT 258 257 261  ? ?Lipid Panel: ?Recent  Labs  ?  01/28/21 ?0846 01/19/22 ?0950  ?CHOL 171 164  ?HDL 45* 42*  ?[place] 103* 90  ?TRIG 131 218*  ?CHOLHDL 3.8 3.9  ? ?TSH: ?No results for input(s): TSH in the last 8760 hours. ?A1C: ?No results found for: HGBA1C ? ? ?Assessment/Plan ?1. Onychomycosis ?-has tried several OTC treatments that have not been effective.  ?-will treat with lamisil daily for 12 weeks.  ?- terbinafine (LAMISIL) 250 MG tablet; Take 1 tablet (250 mg total) by mouth daily.  Dispense: 90 tablet; Refill: 0 ?- CMP with eGFR([facility]); Future in 4 weeks to check liver enzymes ? ?Carlos American. Dewaine Oats, AGNP ? ?[facility] ?[phone]  ? ? ?Virtual Visit via video, mychart ? ?I connected with patient on 01/27/22 at  1:00 PM EDT by video and verified that I am speaking with the correct person using two identifiers. ? ?Location: ?Patient: home ?Provider: [facility] ?  ?I discussed the limitations, risks, security and privacy concerns of performing an evaluation and management service by telephone and the availability of in person appointments. I also discussed with the patient that there may be a patient responsible charge related to this service. The patient expressed understanding and agreed to proceed. ? ? ?I discussed the assessment and treatment plan with the patient. The patient was provided an opportunity to ask questions and all were answered. The patient agreed with the plan and demonstrated an understanding of the instructions. ?  ?The patient was advised to call back or seek an in-person evaluation if the symptoms worsen or if the condition fails to improve as anticipated. ? ?I provided 15 minutes of non-face-to-face time during this encounter. ? ?Carlos American. Dewaine Oats, AGNP ?Avs printed and mailed  ?

## 2022-01-27 NOTE — Telephone Encounter (Signed)
[name] are scheduled for a virtual visit with your provider today.   ? ?Just as we do with appointments in the office, we must obtain your consent to participate.  Your consent will be active for this visit and any virtual visit you may have with one of our providers in the next 365 days.   ? ?If you have a MyChart account, I can also send a copy of this consent to you electronically.  All virtual visits are billed to your insurance company just like a traditional visit in the office.  As this is a virtual visit, video technology does not allow for your provider to perform a traditional examination.  This may limit your provider's ability to fully assess your condition.  If your provider identifies any concerns that need to be evaluated in person or the need to arrange testing such as labs, EKG, etc, we will make arrangements to do so.   ? ?Although advances in technology are sophisticated, we cannot ensure that it will always work on either your end or our end.  If the connection with a video visit is poor, we may have to switch to a telephone visit.  With either a video or telephone visit, we are not always able to ensure that we have a secure connection.   I need to obtain your verbal consent now.   Are you willing to proceed with your visit today?  ? ?Maria Adkins has provided verbal consent on 01/27/2022 for a virtual visit (video or telephone). ? ? ?Carroll Kinds, CMA ?01/27/2022  1:13 PM ?  ?

## 2022-01-27 NOTE — Progress Notes (Signed)
This service is provided via telemedicine ? ?No vital signs collected/recorded due to the encounter was a telemedicine visit.  ? ?Location of patient (ex: home, work):  Home ? ?Patient consents to a telephone visit:  Yes, see encounter dated 01/27/2022 ? ?Location of the provider (ex: office, home):  [place] ? ?Name of any referring provider:  N/A ? ?Names of all persons participating in the telemedicine service and their role in the encounter:  Sherrie Mustache, Nurse Practitioner, Carroll Kinds, CMA, and patient.  ? ?Time spent on call:  5 minutes with medical assistant ? ?

## 2022-02-02 ENCOUNTER — Other Ambulatory Visit: Payer: Self-pay | Admitting: Nurse Practitioner

## 2022-02-02 ENCOUNTER — Other Ambulatory Visit (HOSPITAL_BASED_OUTPATIENT_CLINIC_OR_DEPARTMENT_OTHER): Payer: Self-pay

## 2022-02-02 DIAGNOSIS — F5101 Primary insomnia: Secondary | ICD-10-CM

## 2022-02-02 MED ORDER — METOPROLOL TARTRATE 100 MG PO TABS
100.0000 mg | ORAL_TABLET | Freq: Every day | ORAL | 1 refills | Status: DC
  Filled 2022-02-02: qty 90, 90d supply, fill #0
  Filled 2022-05-04: qty 90, 90d supply, fill #1

## 2022-02-02 MED ORDER — TRAZODONE HCL 50 MG PO TABS
25.0000 mg | ORAL_TABLET | Freq: Every evening | ORAL | 3 refills | Status: DC | PRN
  Filled 2022-02-02: qty 30, 30d supply, fill #0
  Filled 2022-04-07: qty 30, 30d supply, fill #1
  Filled 2022-06-01: qty 30, 30d supply, fill #2
  Filled 2022-09-21 (×2): qty 30, 30d supply, fill #3

## 2022-02-02 NOTE — Telephone Encounter (Signed)
High risk warning populated when attempting to approve refill request. Will send to Lauree Chandler, NP to review.

## 2022-02-06 NOTE — Telephone Encounter (Signed)
Called [employer] for update on patient's application. Received a verbal confirmation from  [place] regarding an approval for [place] patient assistance from 10/15/21 to 09/13/22. Requested approval letter be refaxed  Phone: [phone], option 1, then #  Knox Saliva, PharmD, MPH, BCPS, CPP Clinical Pharmacist (Rheumatology and Pulmonology)

## 2022-02-11 ENCOUNTER — Other Ambulatory Visit (HOSPITAL_COMMUNITY): Payer: Self-pay

## 2022-02-17 ENCOUNTER — Other Ambulatory Visit (HOSPITAL_COMMUNITY): Payer: Self-pay

## 2022-02-24 ENCOUNTER — Other Ambulatory Visit: Payer: Self-pay | Admitting: Nurse Practitioner

## 2022-02-24 DIAGNOSIS — B351 Tinea unguium: Secondary | ICD-10-CM

## 2022-02-25 ENCOUNTER — Other Ambulatory Visit: Payer: PPO

## 2022-02-26 ENCOUNTER — Other Ambulatory Visit (HOSPITAL_BASED_OUTPATIENT_CLINIC_OR_DEPARTMENT_OTHER): Payer: Self-pay

## 2022-03-09 ENCOUNTER — Other Ambulatory Visit (HOSPITAL_COMMUNITY): Payer: Self-pay

## 2022-03-16 ENCOUNTER — Other Ambulatory Visit (HOSPITAL_BASED_OUTPATIENT_CLINIC_OR_DEPARTMENT_OTHER): Payer: Self-pay

## 2022-03-16 ENCOUNTER — Other Ambulatory Visit: Payer: Self-pay

## 2022-03-16 ENCOUNTER — Telehealth: Payer: Self-pay

## 2022-03-16 ENCOUNTER — Encounter: Payer: Self-pay | Admitting: Internal Medicine

## 2022-03-16 ENCOUNTER — Ambulatory Visit (INDEPENDENT_AMBULATORY_CARE_PROVIDER_SITE_OTHER): Payer: 59 | Admitting: Internal Medicine

## 2022-03-16 VITALS — BP 151/81 | HR 63 | Temp 97.9°F | Ht 67.0 in | Wt 185.0 lb

## 2022-03-16 DIAGNOSIS — R0609 Other forms of dyspnea: Secondary | ICD-10-CM | POA: Diagnosis not present

## 2022-03-16 DIAGNOSIS — I10 Essential (primary) hypertension: Secondary | ICD-10-CM | POA: Diagnosis not present

## 2022-03-16 DIAGNOSIS — Z79899 Other long term (current) drug therapy: Secondary | ICD-10-CM

## 2022-03-16 DIAGNOSIS — B20 Human immunodeficiency virus [HIV] disease: Secondary | ICD-10-CM | POA: Diagnosis not present

## 2022-03-16 MED ORDER — HYDROCHLOROTHIAZIDE 25 MG PO TABS
25.0000 mg | ORAL_TABLET | Freq: Every day | ORAL | 11 refills | Status: DC
  Filled 2022-03-16: qty 30, 30d supply, fill #0
  Filled 2022-04-09: qty 30, 30d supply, fill #1
  Filled 2022-05-04: qty 30, 30d supply, fill #2
  Filled 2022-06-10: qty 30, 30d supply, fill #3
  Filled 2022-07-13: qty 30, 30d supply, fill #4
  Filled 2022-08-11: qty 30, 30d supply, fill #5
  Filled 2022-09-08: qty 30, 30d supply, fill #6
  Filled 2022-10-13: qty 30, 30d supply, fill #7
  Filled 2022-11-10: qty 30, 30d supply, fill #8
  Filled 2022-12-08: qty 30, 30d supply, fill #9
  Filled 2023-01-07: qty 30, 30d supply, fill #10
  Filled 2023-02-09: qty 30, 30d supply, fill #11

## 2022-03-16 MED ORDER — ENALAPRIL MALEATE 20 MG PO TABS
20.0000 mg | ORAL_TABLET | Freq: Every day | ORAL | 11 refills | Status: DC
  Filled 2022-03-16: qty 30, 30d supply, fill #0
  Filled 2022-04-13: qty 30, 30d supply, fill #1
  Filled 2022-05-11: qty 30, 30d supply, fill #2
  Filled 2022-06-10: qty 30, 30d supply, fill #3
  Filled 2022-07-07: qty 30, 30d supply, fill #4
  Filled 2022-08-11: qty 20, 20d supply, fill #5
  Filled 2022-08-11: qty 30, 30d supply, fill #5
  Filled 2022-08-11: qty 10, 10d supply, fill #5
  Filled 2022-09-08: qty 30, 30d supply, fill #6
  Filled 2022-10-08: qty 30, 30d supply, fill #7
  Filled 2022-11-05: qty 30, 30d supply, fill #8
  Filled 2022-12-03: qty 30, 30d supply, fill #9
  Filled 2023-01-01: qty 30, 30d supply, fill #10
  Filled 2023-02-01: qty 30, 30d supply, fill #11

## 2022-03-16 NOTE — Telephone Encounter (Signed)
Called [facility] to arrange appointment for patient per Dr. Storm Frisk order for myocardial perfusion imaging.   Left message for administrative assistant Edmon Crape, requesting call back for assistance with scheduling.  Patient is able to do an appointment any weekday except Wednesday, prefers morning appointments around 10 am.  Binnie Kand, RN

## 2022-03-16 NOTE — Telephone Encounter (Signed)
Patient requests PCP recommendation from Dr. Baxter Flattery; would like a specific provider name; did not want general information on [facility] primary care practices.  Request routed to provider.  Binnie Kand, RN

## 2022-03-16 NOTE — Progress Notes (Signed)
RFV: follow-up for hiv disease  [name], female   DOB: 01/12/1951, 71 y.o.   MRN: [id number]  HPI Doreene Nest is a 71yo F with well controlled hiv disease, HTN, asthma, HLD, who states that she Has been checking her bp at home and it is elevated. Notices some headaches with bp elevated.  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  Also had right sided back pain after sweeping porch but now improving."Pain knocked to her knees" never has happened before. Lasted 2 days but now improved suspects muscle strain.  Outpatient Encounter Medications as of 03/16/2022  Medication Sig   albuterol (PROVENTIL) (2.5 MG/3ML) 0.083% nebulizer solution Take 3 mLs (2.5 mg total) by nebulization every 4 (four) hours as needed for wheezing or shortness of breath.   albuterol (VENTOLIN HFA) 108 (90 Base) MCG/ACT inhaler INHALE 2 PUFFS BY MOUTH INTO THE LUNGS EVERY 4 HOURS AS NEEDED   ascorbic acid (VITAMIN C) 500 MG tablet Take by mouth.   bictegravir-emtricitabine-tenofovir AF (BIKTARVY) 50-200-25 MG TABS tablet TAKE 1 TABLET BY MOUTH DAILY.   Biotin w/ Vitamins C & E (HAIR/SKIN/NAILS PO) Take by mouth.   Blood Pressure Monitoring (BLOOD PRESSURE CUFF) MISC 1 Device by Does not apply route daily. DX: I10   Calcium Carbonate (CALCIUM 600 PO) Take 1 tablet by mouth daily.    cetirizine (ZYRTEC) 10 MG tablet TAKE 1 TABLET (10 MG TOTAL) BY MOUTH DAILY.   Cholecalciferol (VITAMIN D) 125 MCG (5000 UT) CAPS Take by mouth.   Dupilumab (DUPIXENT) 300 MG/2ML SOPN Inject 300 mg into the skin every 14 (fourteen) days.   ELDERBERRY PO Take by mouth.   enalapril-hydrochlorothiazide (VASERETIC) 10-25 MG tablet TAKE 1 TABLET BY MOUTH ONCE DAILY   metoprolol tartrate (LOPRESSOR) 100 MG tablet Take 1 tablet (100 mg total) by mouth daily.   montelukast (SINGULAIR) 10 MG tablet TAKE 1 TABLET (10 MG TOTAL) BY MOUTH AT  BEDTIME.   Multiple Vitamins-Minerals (MULTIVITAMIN GUMMIES ADULT PO) Take 2 tablets by mouth daily. Gummy   Multiple Vitamins-Minerals (ZINC PO) Take by mouth.   polyethylene glycol (MIRALAX / GLYCOLAX) 17 g packet Take 17 g by mouth daily.   pravastatin (PRAVACHOL) 20 MG tablet TAKE 1 TABLET BY MOUTH ONCE DAILY   sertraline (ZOLOFT) 25 MG tablet TAKE 1 TABLET (25 MG TOTAL) BY MOUTH DAILY.   Spacer/Aero-Holding Chambers DEVI Use with advair HFA   traZODone (DESYREL) 50 MG tablet Take 1/2-1 tablet (25-50 mg total) by mouth at bedtime as needed for sleep.   vitamin B-12 (CYANOCOBALAMIN) 500 MCG tablet Take 500 mcg by mouth daily.   fluticasone-salmeterol (ADVAIR HFA) 115-21 MCG/ACT inhaler Inhale 1 puff into the lungs 2 (two) times daily. (Patient not taking: Reported on 03/16/2022)   terbinafine (LAMISIL) 250 MG tablet Take 1 tablet (250 mg total) by mouth daily. (Patient not taking: Reported on 03/16/2022)   [DISCONTINUED] linaclotide (LINZESS) 145 MCG CAPS capsule Take 1 capsule (145 mcg total) by mouth daily before breakfast.   Facility-Administered Encounter Medications as of 03/16/2022  Medication   0.9 %  sodium chloride infusion     Patient Active Problem List   Diagnosis Date Noted   Depression, major, single episode, complete remission ([place]) 03/28/2021   Mixed hyperlipidemia 03/28/2021   Primary insomnia 03/28/2021   Depression, major, single episode, moderate ([facility]) 03/28/2021   Chronic idiopathic constipation 03/28/2021   Seasonal allergies 01/13/2018   Asthma  02/05/2017   Allergy 12/16/2016   Age related osteoporosis 08/19/2016   Essential hypertension 08/19/2016   Human immunodeficiency virus (HIV) disease ([place]) 08/19/2016   Hx of adenomatous polyp of colon 2015     There are no preventive care reminders to display for this patient.   Review of Systems 12 point ros is negative except for what is mentioned is above. Physical Exam   BP (!) 151/81 Comment: Provider  notified  Pulse 63   Temp 97.9 F (36.6 C) (Oral)   Ht '5\' 7"'$  (1.702 m)   Wt 185 lb (83.9 kg)   SpO2 97%   BMI 28.98 kg/m   Physical Exam  Constitutional:  oriented to person, place, and time. appears well-developed and well-nourished. No distress.  HENT: [place]/AT, PERRLA, no scleral icterus Mouth/Throat: Oropharynx is clear and moist. No oropharyngeal exudate.  Cardiovascular: Normal rate, regular rhythm and normal heart sounds. Exam reveals no gallop and no friction rub.  No murmur heard.  Pulmonary/Chest: Effort normal and breath sounds normal. No respiratory distress.  has no wheezes.  Neck = supple, no nuchal rigidity Abdominal: Soft. Bowel sounds are normal.  exhibits no distension. There is no tenderness.  Lymphadenopathy: no cervical adenopathy. No axillary adenopathy Neurological: alert and oriented to person, place, and time.  Skin: Skin is warm and dry. No rash noted. No erythema.  Psychiatric: a normal mood and affect.  behavior is normal.   Lab Results  Component Value Date   CD4TCELL 18 (L) 08/25/2021   Lab Results  Component Value Date   CD4TABS 620 08/25/2021   CD4TABS 475 01/28/2021   CD4TABS 526 07/17/2020   Lab Results  Component Value Date   HIV1RNAQUANT Not Detected 08/25/2021   Lab Results  Component Value Date   HEPBSAB POS (A) 09/24/2016   Lab Results  Component Value Date   LABRPR NON-REACTIVE 07/17/2020    CBC Lab Results  Component Value Date   WBC 7.7 01/19/2022   RBC 4.42 01/19/2022   HGB 14.0 01/19/2022   HCT 41.1 01/19/2022   PLT 261 01/19/2022   MCV 93.0 01/19/2022   MCH 31.7 01/19/2022   MCHC 34.1 01/19/2022   RDW 13.2 01/19/2022   LYMPHSABS 3,658 01/19/2022   MONOABS 0.2 02/03/2020   EOSABS 277 01/19/2022    BMET Lab Results  Component Value Date   NA 139 01/19/2022   K 3.8 01/19/2022   CL 101 01/19/2022   CO2 32 01/19/2022   GLUCOSE 87 01/19/2022   BUN 10 01/19/2022   CREATININE 0.97 01/19/2022   CALCIUM 9.6  01/19/2022   GFRNONAA 64 01/28/2021   GFRAA 75 01/28/2021   Family hx = cad with mother and sisters   Assessment and Plan  HIV disease= will check labs to see that she is still well controlled  Long term medication management= will check cr  HTN, poorly controlled =will increase enalapril '20mg'$  and keep hctz '25mg'$   DOE = concern for CAD.  will order cardiac stress test  Needs referral for new pcp

## 2022-03-16 NOTE — Telephone Encounter (Signed)
Attempted to call patient to make them aware of appointment. No answer. Left HIPAA-compliant message requesting call back.

## 2022-03-16 NOTE — Telephone Encounter (Signed)
Spoke with staff member at [facility]; patient scheduled for Friday, 03/20/22 at 10 am at the [facility] location ([place] [id number], [phone]). Staff member stated test will take 3 hours; no special prep needed.  Binnie Kand, RN

## 2022-03-16 NOTE — Telephone Encounter (Signed)
Received call back from patient. Verbalized understanding of appointment day/time and location. All questions answered.  Binnie Kand, RN

## 2022-03-18 ENCOUNTER — Telehealth (HOSPITAL_COMMUNITY): Payer: Self-pay | Admitting: *Deleted

## 2022-03-18 LAB — T-HELPER CELLS (CD4) COUNT (NOT AT ARMC)
Absolute CD4: 508 cells/uL (ref 490–1740)
CD4 T Helper %: 13 % — ABNORMAL LOW (ref 30–61)
Total lymphocyte count: 3805 cells/uL (ref 850–3900)

## 2022-03-18 NOTE — Telephone Encounter (Signed)
Patient given detailed instructions per Myocardial Perfusion Study Information Sheet for the test on 03/20/2022 at 7:30. Patient notified to arrive 15 minutes early and that it is imperative to arrive on time for appointment to keep from having the test rescheduled.  If you need to cancel or reschedule your appointment, please call the office within 24 hours of your appointment. . Patient verbalized understanding.Maria Adkins

## 2022-03-19 ENCOUNTER — Other Ambulatory Visit (HOSPITAL_BASED_OUTPATIENT_CLINIC_OR_DEPARTMENT_OTHER): Payer: Self-pay

## 2022-03-19 LAB — LIPID PANEL
Cholesterol: 168 mg/dL (ref <200)
HDL: 48 mg/dL — ABNORMAL LOW (ref >=50)
LDL Cholesterol (Calc): 89 mg/dL (calc)
Non-HDL Cholesterol (Calc): 120 mg/dL (calc) (ref <130)
Total CHOL/HDL Ratio: 3.5 (calc) (ref <5.0)
Triglycerides: 213 mg/dL — ABNORMAL HIGH (ref <150)

## 2022-03-19 LAB — CBC WITH DIFFERENTIAL/PLATELET
Absolute Monocytes: 599 cells/uL (ref 200–950)
Basophils Absolute: 88 cells/uL (ref 0–200)
Basophils Relative: 1.2 %
Eosinophils Absolute: 358 cells/uL (ref 15–500)
Eosinophils Relative: 4.9 %
HCT: 43.7 % (ref 35.0–45.0)
Hemoglobin: 14.8 g/dL (ref 11.7–15.5)
Lymphs Abs: 3577 cells/uL (ref 850–3900)
MCH: 31.6 pg (ref 27.0–33.0)
MCHC: 33.9 g/dL (ref 32.0–36.0)
MCV: 93.4 fL (ref 80.0–100.0)
MPV: 10 fL (ref 7.5–12.5)
Monocytes Relative: 8.2 %
Neutro Abs: 2679 cells/uL (ref 1500–7800)
Neutrophils Relative %: 36.7 %
Platelets: 239 10*3/uL (ref 140–400)
RBC: 4.68 10*6/uL (ref 3.80–5.10)
RDW: 12.9 % (ref 11.0–15.0)
Total Lymphocyte: 49 %
WBC: 7.3 10*3/uL (ref 3.8–10.8)

## 2022-03-19 LAB — COMPLETE METABOLIC PANEL WITH GFR
AG Ratio: 1.9 (calc) (ref 1.0–2.5)
ALT: 17 U/L (ref 6–29)
AST: 20 U/L (ref 10–35)
Albumin: 4.3 g/dL (ref 3.6–5.1)
Alkaline phosphatase (APISO): 53 U/L (ref 37–153)
BUN/Creatinine Ratio: 13 (calc) (ref 6–22)
BUN: 14 mg/dL (ref 7–25)
CO2: 34 mmol/L — ABNORMAL HIGH (ref 20–32)
Calcium: 9.8 mg/dL (ref 8.6–10.4)
Chloride: 101 mmol/L (ref 98–110)
Creat: 1.04 mg/dL — ABNORMAL HIGH (ref 0.60–1.00)
Globulin: 2.3 g/dL (calc) (ref 1.9–3.7)
Glucose, Bld: 104 mg/dL — ABNORMAL HIGH (ref 65–99)
Potassium: 4.2 mmol/L (ref 3.5–5.3)
Sodium: 140 mmol/L (ref 135–146)
Total Bilirubin: 0.8 mg/dL (ref 0.2–1.2)
Total Protein: 6.6 g/dL (ref 6.1–8.1)
eGFR: 57 mL/min/{1.73_m2} — ABNORMAL LOW (ref >=60)

## 2022-03-19 LAB — HIV-1 RNA QUANT-NO REFLEX-BLD
HIV 1 RNA Quant: <20 Copies/mL — ABNORMAL HIGH
HIV-1 RNA Quant, Log: <1.3 Log cps/mL — ABNORMAL HIGH

## 2022-03-20 ENCOUNTER — Ambulatory Visit (HOSPITAL_COMMUNITY): Payer: 59 | Attending: Internal Medicine

## 2022-03-20 ENCOUNTER — Other Ambulatory Visit (HOSPITAL_COMMUNITY): Payer: Self-pay

## 2022-03-20 DIAGNOSIS — R0609 Other forms of dyspnea: Secondary | ICD-10-CM | POA: Diagnosis not present

## 2022-03-20 LAB — MYOCARDIAL PERFUSION IMAGING
LV dias vol: 81 mL (ref 46–106)
LV sys vol: 34 mL
Nuc Stress EF: 58 %
Peak HR: 82 {beats}/min
Rest HR: 58 {beats}/min
Rest Nuclear Isotope Dose: 10.3 mCi
SDS: 0
SRS: 0
SSS: 0
ST Depression (mm): 0 mm
Stress Nuclear Isotope Dose: 31.4 mCi
TID: 1

## 2022-03-20 MED ORDER — AMINOPHYLLINE 25 MG/ML IV SOLN
150.0000 mg | Freq: Once | INTRAVENOUS | Status: AC
  Administered 2022-03-20: 150 mg via INTRAVENOUS

## 2022-03-20 MED ORDER — REGADENOSON 0.4 MG/5ML IV SOLN
0.4000 mg | Freq: Once | INTRAVENOUS | Status: AC
  Administered 2022-03-20: 0.4 mg via INTRAVENOUS

## 2022-03-20 MED ORDER — TECHNETIUM TC 99M TETROFOSMIN IV KIT
31.4000 | PACK | Freq: Once | INTRAVENOUS | Status: AC | PRN
  Administered 2022-03-20: 31.4 via INTRAVENOUS

## 2022-03-20 MED ORDER — TECHNETIUM TC 99M TETROFOSMIN IV KIT
10.3000 | PACK | Freq: Once | INTRAVENOUS | Status: AC | PRN
  Administered 2022-03-20: 10.3 via INTRAVENOUS

## 2022-03-23 ENCOUNTER — Other Ambulatory Visit (HOSPITAL_COMMUNITY): Payer: Self-pay

## 2022-03-25 ENCOUNTER — Other Ambulatory Visit (HOSPITAL_COMMUNITY): Payer: Self-pay

## 2022-04-03 ENCOUNTER — Other Ambulatory Visit (HOSPITAL_COMMUNITY): Payer: Self-pay

## 2022-04-06 ENCOUNTER — Telehealth: Payer: Self-pay | Admitting: Internal Medicine

## 2022-04-06 DIAGNOSIS — J4550 Severe persistent asthma, uncomplicated: Secondary | ICD-10-CM

## 2022-04-06 MED ORDER — DUPIXENT 300 MG/2ML ~~LOC~~ SOAJ: 300.0000 mg | SUBCUTANEOUS | 0 refills | Status: DC

## 2022-04-06 NOTE — Telephone Encounter (Signed)
Refill sent for [place] to [facility]: [phone]  Dose: 300 mg SQ every 2 weeks  Last OV: 08/14/21 Provider: Dr. Shearon Stalls   Next OV: not scheduled but should have been in June 2023. Routing to scheduling team. Pt will need f/u appt after she is back in town.  Knox Saliva, PharmD, MPH, BCPS Clinical Pharmacist (Rheumatology and Pulmonology)

## 2022-04-06 NOTE — Telephone Encounter (Signed)
Pt scheduled for overdue f/u with Dr Shearon Stalls on 8/18

## 2022-04-07 ENCOUNTER — Other Ambulatory Visit (HOSPITAL_BASED_OUTPATIENT_CLINIC_OR_DEPARTMENT_OTHER): Payer: Self-pay

## 2022-04-09 ENCOUNTER — Other Ambulatory Visit (HOSPITAL_BASED_OUTPATIENT_CLINIC_OR_DEPARTMENT_OTHER): Payer: Self-pay

## 2022-04-10 ENCOUNTER — Other Ambulatory Visit (HOSPITAL_COMMUNITY): Payer: Self-pay

## 2022-04-13 ENCOUNTER — Other Ambulatory Visit (HOSPITAL_BASED_OUTPATIENT_CLINIC_OR_DEPARTMENT_OTHER): Payer: Self-pay

## 2022-04-14 ENCOUNTER — Other Ambulatory Visit (HOSPITAL_BASED_OUTPATIENT_CLINIC_OR_DEPARTMENT_OTHER): Payer: Self-pay

## 2022-04-15 ENCOUNTER — Other Ambulatory Visit (HOSPITAL_COMMUNITY): Payer: Self-pay

## 2022-04-24 ENCOUNTER — Other Ambulatory Visit (HOSPITAL_COMMUNITY): Payer: Self-pay

## 2022-05-01 ENCOUNTER — Ambulatory Visit: Payer: 59 | Admitting: Internal Medicine

## 2022-05-04 ENCOUNTER — Other Ambulatory Visit (HOSPITAL_BASED_OUTPATIENT_CLINIC_OR_DEPARTMENT_OTHER): Payer: Self-pay

## 2022-05-11 ENCOUNTER — Other Ambulatory Visit (HOSPITAL_BASED_OUTPATIENT_CLINIC_OR_DEPARTMENT_OTHER): Payer: Self-pay

## 2022-05-15 ENCOUNTER — Other Ambulatory Visit (HOSPITAL_COMMUNITY): Payer: Self-pay

## 2022-05-20 ENCOUNTER — Other Ambulatory Visit (HOSPITAL_BASED_OUTPATIENT_CLINIC_OR_DEPARTMENT_OTHER): Payer: Self-pay

## 2022-05-20 ENCOUNTER — Other Ambulatory Visit (HOSPITAL_COMMUNITY): Payer: Self-pay

## 2022-05-20 ENCOUNTER — Other Ambulatory Visit: Payer: Self-pay | Admitting: Critical Care Medicine

## 2022-05-20 ENCOUNTER — Other Ambulatory Visit: Payer: Self-pay | Admitting: Nurse Practitioner

## 2022-05-20 DIAGNOSIS — F321 Major depressive disorder, single episode, moderate: Secondary | ICD-10-CM

## 2022-05-20 NOTE — Telephone Encounter (Signed)
Patient has request refill on medication Zoloft '25mg'$ . Patient medication last refilled 08/04/2021. Patient medication has High Risk Warnings with another medication. Medication pend and sent to PCP Dewaine Oats Carlos American, NP for approval.

## 2022-05-21 ENCOUNTER — Other Ambulatory Visit (HOSPITAL_BASED_OUTPATIENT_CLINIC_OR_DEPARTMENT_OTHER): Payer: Self-pay

## 2022-05-21 MED ORDER — SERTRALINE HCL 25 MG PO TABS
25.0000 mg | ORAL_TABLET | Freq: Every day | ORAL | 2 refills | Status: DC
  Filled 2022-05-21: qty 90, 90d supply, fill #0
  Filled 2022-05-25 – 2022-11-10 (×2): qty 90, 90d supply, fill #1
  Filled 2023-02-09: qty 90, 90d supply, fill #2

## 2022-05-22 ENCOUNTER — Other Ambulatory Visit (HOSPITAL_BASED_OUTPATIENT_CLINIC_OR_DEPARTMENT_OTHER): Payer: Self-pay

## 2022-05-25 ENCOUNTER — Other Ambulatory Visit (HOSPITAL_BASED_OUTPATIENT_CLINIC_OR_DEPARTMENT_OTHER): Payer: Self-pay

## 2022-06-01 ENCOUNTER — Other Ambulatory Visit (HOSPITAL_BASED_OUTPATIENT_CLINIC_OR_DEPARTMENT_OTHER): Payer: Self-pay

## 2022-06-04 ENCOUNTER — Telehealth: Payer: Self-pay

## 2022-06-04 ENCOUNTER — Other Ambulatory Visit (HOSPITAL_BASED_OUTPATIENT_CLINIC_OR_DEPARTMENT_OTHER): Payer: Self-pay

## 2022-06-04 MED ORDER — MONTELUKAST SODIUM 10 MG PO TABS
ORAL_TABLET | Freq: Every day | ORAL | 11 refills | Status: DC
  Filled 2022-06-04: qty 30, 30d supply, fill #0
  Filled 2022-09-21 (×2): qty 30, 30d supply, fill #1
  Filled 2022-12-18: qty 30, 30d supply, fill #2

## 2022-06-04 NOTE — Telephone Encounter (Signed)
Since her asthma has been stable refill sent to pharmacy.

## 2022-06-04 NOTE — Telephone Encounter (Signed)
Patient called to question why we denied her refill request for Singulair.   I informed patient we did not deny her rx, it was denied by her pulmonologist.   Patient then asked if Maria Chandler, NP would be ok refilling   Please advise

## 2022-06-10 ENCOUNTER — Other Ambulatory Visit (HOSPITAL_BASED_OUTPATIENT_CLINIC_OR_DEPARTMENT_OTHER): Payer: Self-pay

## 2022-06-15 ENCOUNTER — Other Ambulatory Visit: Payer: Self-pay | Admitting: Nurse Practitioner

## 2022-06-15 DIAGNOSIS — Z1231 Encounter for screening mammogram for malignant neoplasm of breast: Secondary | ICD-10-CM

## 2022-06-16 ENCOUNTER — Ambulatory Visit (INDEPENDENT_AMBULATORY_CARE_PROVIDER_SITE_OTHER): Payer: 59

## 2022-06-16 ENCOUNTER — Other Ambulatory Visit: Payer: Self-pay

## 2022-06-16 ENCOUNTER — Other Ambulatory Visit (HOSPITAL_COMMUNITY): Payer: Self-pay

## 2022-06-16 DIAGNOSIS — Z23 Encounter for immunization: Secondary | ICD-10-CM | POA: Diagnosis not present

## 2022-06-16 DIAGNOSIS — B20 Human immunodeficiency virus [HIV] disease: Secondary | ICD-10-CM | POA: Diagnosis not present

## 2022-06-18 ENCOUNTER — Other Ambulatory Visit (HOSPITAL_COMMUNITY): Payer: Self-pay

## 2022-06-22 ENCOUNTER — Telehealth: Payer: Self-pay | Admitting: Pharmacist

## 2022-06-22 DIAGNOSIS — J4550 Severe persistent asthma, uncomplicated: Secondary | ICD-10-CM

## 2022-06-22 NOTE — Telephone Encounter (Signed)
Received refill request from [facility] for patient's Maria Adkins. She has appt on 06/25/22. Will senf after this appt. Patient was no-show to appt in August and has not been seen since Dec 2022  Knox Saliva, PharmD, MPH, BCPS, CPP Clinical Pharmacist (Rheumatology and Pulmonology)

## 2022-06-25 ENCOUNTER — Ambulatory Visit (INDEPENDENT_AMBULATORY_CARE_PROVIDER_SITE_OTHER): Payer: 59 | Admitting: Internal Medicine

## 2022-06-25 ENCOUNTER — Encounter: Payer: Self-pay | Admitting: Internal Medicine

## 2022-06-25 VITALS — BP 122/72 | HR 81 | Temp 98.0°F | Ht 67.0 in | Wt 183.6 lb

## 2022-06-25 DIAGNOSIS — J301 Allergic rhinitis due to pollen: Secondary | ICD-10-CM | POA: Diagnosis not present

## 2022-06-25 DIAGNOSIS — J4550 Severe persistent asthma, uncomplicated: Secondary | ICD-10-CM

## 2022-06-25 MED ORDER — DUPIXENT 300 MG/2ML ~~LOC~~ SOAJ: 300.0000 mg | SUBCUTANEOUS | 1 refills | Status: DC

## 2022-06-25 NOTE — Telephone Encounter (Signed)
Refill sent for [place] to [facility]: [phone]  Dose: 300 mg SQ  every 2 weeks  Last OV: 06/25/22 Provider: Dr. Shearon Stalls  Next OV: 6 months (not yet scheduled)  Knox Saliva, PharmD, MPH, BCPS Clinical Pharmacist (Rheumatology and Pulmonology)

## 2022-06-25 NOTE — Patient Instructions (Addendum)
Please schedule follow up scheduled with myself in 6 months.  If my schedule is not open yet, we will contact you with a reminder closer to that time. Please call [phone] if you haven't heard from [place] a month before.   Continue dupixent.  Can step down advair therapy to once day.  Continue treatment for allergic rhinitis with singulair and cetirizine.   We can discuss a break off dupixent next year if you like and monitor how you do off for a few months.

## 2022-06-25 NOTE — Progress Notes (Signed)
Maria Adkins    [id number]    02-May-1951  Primary Care Physician:Eubanks, Carlos American, NP Date of Appointment: 06/25/2022 Established Patient Visit  Chief complaint:   Chief Complaint  Patient presents with   Follow-up    No complaints.     HPI: Maria Adkins is a 71 y.o. 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 On Dupilumab started 06/06/2020 for severe steroid dependent asthma.   Interval Updates: Here for follow up for Severe persistent asthma.  No thrush on advair.  Allergies well controlled. No issues with singulair and zyrtec No rescue inhaler use.   Current Regimen: low dose advair, singulair, flonase, cetirizine, dupilumab Asthma Triggers: stress, perfume, pollen, smoke, burnt foods cold weather Exacerbations in the last year: none since July 2021 when starting dupixent. History of hospitalization or intubation: yes many times - last time in 2020. Required intubation during a hospital stay more than ten years ago. Previously very poorly controlled asthma Hives: none Allergy Testing: as a child.  GERD: none Allergic Rhinitis: yes on flonase, cetirizine, singulair.  ACT:  Asthma Control Test ACT Total Score  08/14/2021  8:57 AM 24  11/07/2020  8:56 AM 25  08/01/2020  9:07 AM 25   Lives at home with husband and son's family including grandson. Helps out with his daily care. No pets at home. No passive smoke exposure.    I have reviewed the patient's family social and past medical history and updated as appropriate.   Past Medical History:  Diagnosis Date   Allergy    seasonal allergies   Asthma    uses unhaler   Blood transfusion without reported diagnosis 1990's   Cataract    bilateral sx   Depression    on meds   High blood pressure    on meds   HIV (human immunodeficiency virus infection) ([place])    on meds   Hx of adenomatous polyp of colon 2015   Hyperlipidemia    on meds   Osteoarthritis     Osteopenia     Past Surgical History:  Procedure Laterality Date   Biopsy of Liver     CATARACT EXTRACTION, BILATERAL     CHOLECYSTECTOMY     COLONOSCOPY  2015   [facility]-Dr.Liakos-prep(exc)-TA x 1   CYST REMOVAL HAND Left    CYSTECTOMY     off of back   LAPAROSCOPIC SALPINGOOPHERECTOMY Right    POLYPECTOMY  2015   TA    Family History  Problem Relation Age of Onset   Diabetes Mother        died at age 46   Hypertension Mother    Asthma Sister    Diabetes Sister    Hypertension Sister    Arthritis Sister    Diabetes Sister    Hypertension Sister    Asthma Sister    Arthritis Sister    Heart defect Sister    Asthma Son        controlled   Crohn's disease Niece    Colon polyps Neg Hx    Colon cancer Neg Hx    Esophageal cancer Neg Hx    Stomach cancer Neg Hx     Social History   Occupational History   Not on file  Tobacco Use   Smoking status: Former    Packs/day: 1.00    Years: 14.00    Total pack years: 14.00    Types: Cigarettes    Quit date:  09/14/1978    Years since quitting: 43.8   Smokeless tobacco: Never  Vaping Use   Vaping Use: Never used  Substance and Sexual Activity   Alcohol use: No   Drug use: No   Sexual activity: Yes    Partners: Male    Birth control/protection: Condom    Comment: declined condoms     Physical Exam: Blood pressure 122/72, pulse 81, temperature 98 F (36.7 C), temperature source Oral, height _0  (1.702 m), weight 183 lb 9.6 oz (83.3 kg), SpO2 94 %.  Gen:      NAD HEENT:   no thrush, mild cobblestoning, mallampati IV Lungs:    ctab no wheezes or crackles CV:         RRR no mrg no edema   Data Reviewed: Imaging: I have personally reviewed the chest xray June 2021 - mild hyperinflation.   PFTs:      Latest Ref Rng & Units 04/22/2020    9:37 AM  PFT Results  FVC-Pre L 2.48   FVC-Predicted Pre % 88   FVC-Post L 2.55   FVC-Predicted Post % 91   Pre FEV1/FVC % % 61   Post FEV1/FCV % % 71   FEV1-Pre L 1.52    FEV1-Predicted Pre % 69   FEV1-Post L 1.81   DLCO uncorrected ml/min/mmHg 19.68   DLCO UNC% % 88   DLCO corrected ml/min/mmHg 19.68   DLCO COR %Predicted % 88   DLVA Predicted % 106   TLC L 4.78   TLC % Predicted % 84   RV % Predicted % 93    I have personally reviewed the patient's PFTs and show mild airflow limitation with +BD response.   Labs: Elevated eosinophils  Noted on prior CBCs while on prednisone.   Immunization status: Immunization History  Administered Date(s) Administered   Fluad Quad(high Dose 65+) 05/16/2019, 05/31/2020, 06/06/2021, 06/16/2022   Influenza,inj,Quad PF,6+ Mos 06/17/2017, 06/09/2018   Influenza-Unspecified 06/11/2016   Moderna Covid-19 Vaccine Bivalent Booster 45yr & up 07/12/2021   Moderna Sars-Covid-2 Vaccination 07/06/2020   PFIZER(Purple Top)SARS-COV-2 Vaccination 10/20/2019, 11/14/2019   Pneumococcal Conjugate-13 05/20/2018   Pneumococcal Polysaccharide-23 02/15/2017   Tdap 09/21/2017   Zoster Recombinat (Shingrix) 07/19/2018, 02/13/2019    External Notes reviewed: PCP and general surgery  Assessment:  Severe persistent eosinophilic asthma, well controlled Allergic Rhinitis, well controlled   Plan/Recommendations: Continue dupixent.  Can step down ICS-LABA therapy to once day.   continue treatment for allergic rhinitis with singulair and cetirizine.   Return to Care: Return in about 6 months (around 12/25/2022).   NLenice Llamas MD Pulmonary and [facility]

## 2022-07-01 ENCOUNTER — Ambulatory Visit
Admission: RE | Admit: 2022-07-01 | Discharge: 2022-07-01 | Disposition: A | Discharge status: Home / Self Care | Payer: 59 | Source: Ambulatory Visit | Attending: Nurse Practitioner | Admitting: Nurse Practitioner

## 2022-07-01 DIAGNOSIS — Z1231 Encounter for screening mammogram for malignant neoplasm of breast: Secondary | ICD-10-CM | POA: Diagnosis not present

## 2022-07-07 ENCOUNTER — Other Ambulatory Visit (HOSPITAL_BASED_OUTPATIENT_CLINIC_OR_DEPARTMENT_OTHER): Payer: Self-pay

## 2022-07-10 ENCOUNTER — Other Ambulatory Visit (HOSPITAL_COMMUNITY): Payer: Self-pay

## 2022-07-13 ENCOUNTER — Other Ambulatory Visit (HOSPITAL_BASED_OUTPATIENT_CLINIC_OR_DEPARTMENT_OTHER): Payer: Self-pay

## 2022-07-17 ENCOUNTER — Other Ambulatory Visit (HOSPITAL_COMMUNITY): Payer: Self-pay

## 2022-07-24 ENCOUNTER — Encounter: Payer: PPO | Admitting: Nurse Practitioner

## 2022-07-28 NOTE — Progress Notes (Signed)
This encounter was created in error - please disregard.

## 2022-07-30 ENCOUNTER — Other Ambulatory Visit: Payer: Self-pay | Admitting: Nurse Practitioner

## 2022-07-30 ENCOUNTER — Other Ambulatory Visit (HOSPITAL_BASED_OUTPATIENT_CLINIC_OR_DEPARTMENT_OTHER): Payer: Self-pay

## 2022-07-30 ENCOUNTER — Other Ambulatory Visit: Payer: Self-pay

## 2022-07-30 MED ORDER — METOPROLOL TARTRATE 100 MG PO TABS
100.0000 mg | ORAL_TABLET | Freq: Every day | ORAL | 1 refills | Status: DC
  Filled 2022-07-30: qty 62, 62d supply, fill #0
  Filled 2022-07-30: qty 28, 28d supply, fill #0
  Filled 2022-10-22: qty 90, 90d supply, fill #1

## 2022-07-31 ENCOUNTER — Other Ambulatory Visit (HOSPITAL_BASED_OUTPATIENT_CLINIC_OR_DEPARTMENT_OTHER): Payer: Self-pay

## 2022-08-11 ENCOUNTER — Other Ambulatory Visit: Payer: Self-pay | Admitting: Internal Medicine

## 2022-08-11 ENCOUNTER — Other Ambulatory Visit (HOSPITAL_COMMUNITY): Payer: Self-pay

## 2022-08-11 ENCOUNTER — Other Ambulatory Visit (HOSPITAL_BASED_OUTPATIENT_CLINIC_OR_DEPARTMENT_OTHER): Payer: Self-pay

## 2022-08-11 DIAGNOSIS — B20 Human immunodeficiency virus [HIV] disease: Secondary | ICD-10-CM

## 2022-08-11 MED ORDER — BIKTARVY 50-200-25 MG PO TABS
1.0000 | ORAL_TABLET | Freq: Every day | ORAL | 1 refills | Status: DC
  Filled 2022-08-11 – 2022-08-18 (×3): qty 30, 30d supply, fill #0
  Filled 2022-09-10: qty 30, 30d supply, fill #1

## 2022-08-13 ENCOUNTER — Encounter: Payer: Self-pay | Admitting: Family

## 2022-08-13 ENCOUNTER — Encounter: Payer: PPO | Admitting: Nurse Practitioner

## 2022-08-13 ENCOUNTER — Ambulatory Visit (INDEPENDENT_AMBULATORY_CARE_PROVIDER_SITE_OTHER): Payer: PPO | Admitting: Family

## 2022-08-13 VITALS — BP 130/82 | HR 77 | Temp 97.7°F | Resp 17 | Ht 67.0 in | Wt 181.8 lb

## 2022-08-13 DIAGNOSIS — M7989 Other specified soft tissue disorders: Secondary | ICD-10-CM | POA: Diagnosis not present

## 2022-08-13 NOTE — Progress Notes (Signed)
Provider: Rethel Sebek FNP-C  Lauree Chandler, NP  Patient Care Team: Lauree Chandler, NP as PCP - General (Geriatric Medicine)  Extended Emergency Contact Information Primary Emergency Contact: [facility] Address: [address]          [place], [place] [address] Johnnette Litter of [place] Phone: [phone] Relation: Spouse Secondary Emergency Contact: Marines,Rushdan Address: [place],  [address] Johnnette Litter of [place] Phone: [phone] Mobile Phone: [phone] Relation: Son  Code Status:  Full Code  Goals of care: Advanced Directive information    08/13/2022   10:11 AM  Advanced Directives  Does Patient Have a Medical Advance Directive? Yes  Type of Paramedic of [place];Living will;Out of facility DNR (pink MOST or yellow form)  Does patient want to make changes to medical advance directive? No - Patient declined     Chief Complaint  Patient presents with   Acute Visit    Patient states she is here because she believe it is two blood clots forming on fingers    HPI:  Pt is a 71 y.o. female seen today for an acute visit for evaluation of bilateral hand swelling x couple of days.thought had blood clot.Left middle finger started small but has gotten bigger.At first swelling was tender but not anymore unless when pressed.Has had no redness,drainage,fever or chills.    Past Medical History:  Diagnosis Date   Allergy    seasonal allergies   Asthma    uses unhaler   Blood transfusion without reported diagnosis 1990's   Cataract    bilateral sx   Depression    on meds   High blood pressure    on meds   HIV (human immunodeficiency virus infection) ([place])    on meds   Hx of adenomatous polyp of colon 2015   Hyperlipidemia    on meds   Osteoarthritis    Osteopenia    Past Surgical History:  Procedure Laterality Date   Biopsy of Liver     CATARACT EXTRACTION, BILATERAL      CHOLECYSTECTOMY     COLONOSCOPY  2015   [facility]-Dr.Liakos-prep(exc)-TA x 1   CYST REMOVAL HAND Left    CYSTECTOMY     off of back   LAPAROSCOPIC SALPINGOOPHERECTOMY Right    POLYPECTOMY  2015   TA    No Known Allergies  Outpatient Encounter Medications as of 08/13/2022  Medication Sig   albuterol (PROVENTIL) (2.5 MG/3ML) 0.083% nebulizer solution Take 3 mLs (2.5 mg total) by nebulization every 4 (four) hours as needed for wheezing or shortness of breath.   albuterol (VENTOLIN HFA) 108 (90 Base) MCG/ACT inhaler INHALE 2 PUFFS BY MOUTH INTO THE LUNGS EVERY 4 HOURS AS NEEDED   ascorbic acid (VITAMIN C) 500 MG tablet Take by mouth.   bictegravir-emtricitabine-tenofovir AF (BIKTARVY) 50-200-25 MG TABS tablet TAKE 1 TABLET BY MOUTH DAILY.   Biotin w/ Vitamins C & E (HAIR/SKIN/NAILS PO) Take by mouth.   Blood Pressure Monitoring (BLOOD PRESSURE CUFF) MISC 1 Device by Does not apply route daily. DX: I10   Calcium Carbonate (CALCIUM 600 PO) Take 1 tablet by mouth daily.    cetirizine (ZYRTEC) 10 MG tablet TAKE 1 TABLET (10 MG TOTAL) BY MOUTH DAILY.   Cholecalciferol (VITAMIN D) 125 MCG (5000 UT) CAPS Take by mouth.   Dupilumab (DUPIXENT) 300 MG/2ML SOPN Inject 300 mg into the skin every 14 (fourteen) days.   ELDERBERRY PO  Take by mouth.   enalapril (VASOTEC) 20 MG tablet Take 1 tablet (20 mg total) by mouth daily.   fluticasone-salmeterol (ADVAIR HFA) 115-21 MCG/ACT inhaler Inhale 1 puff into the lungs 2 (two) times daily.   hydrochlorothiazide (HYDRODIURIL) 25 MG tablet Take 1 tablet (25 mg total) by mouth daily.   metoprolol tartrate (LOPRESSOR) 100 MG tablet Take 1 tablet (100 mg total) by mouth daily.   montelukast (SINGULAIR) 10 MG tablet TAKE 1 TABLET (10 MG TOTAL) BY MOUTH AT BEDTIME.   Multiple Vitamins-Minerals (MULTIVITAMIN GUMMIES ADULT PO) Take 2 tablets by mouth daily. Gummy   Multiple Vitamins-Minerals (ZINC PO) Take by mouth.   polyethylene glycol (MIRALAX / GLYCOLAX) 17 g packet  Take 17 g by mouth daily.   pravastatin (PRAVACHOL) 20 MG tablet TAKE 1 TABLET BY MOUTH ONCE DAILY   sertraline (ZOLOFT) 25 MG tablet Take 1 tablet (25 mg total) by mouth daily.   Spacer/Aero-Holding Chambers DEVI Use with advair HFA   SPIKEVAX syringe Inject 0.5 mLs into the muscle once.   terbinafine (LAMISIL) 250 MG tablet Take 1 tablet (250 mg total) by mouth daily.   traZODone (DESYREL) 50 MG tablet Take 1/2-1 tablet (25-50 mg total) by mouth at bedtime as needed for sleep.   vitamin B-12 (CYANOCOBALAMIN) 500 MCG tablet Take 500 mcg by mouth daily.   [DISCONTINUED] linaclotide (LINZESS) 145 MCG CAPS capsule Take 1 capsule (145 mcg total) by mouth daily before breakfast.   Facility-Administered Encounter Medications as of 08/13/2022  Medication   0.9 %  sodium chloride infusion    Review of Systems  Constitutional:  Negative for appetite change, chills, fatigue, fever and unexpected weight change.  Eyes:  Negative for pain, discharge, redness, itching and visual disturbance.  Respiratory:  Negative for cough, chest tightness, shortness of breath and wheezing.   Cardiovascular:  Negative for chest pain, palpitations and leg swelling.  Musculoskeletal:  Negative for arthralgias, back pain, gait problem, myalgias, neck pain and neck stiffness.       Base of middle fingers swelling and tenderness   Skin:  Negative for color change, pallor, rash and wound.  Neurological:  Negative for dizziness, speech difficulty, weakness, light-headedness, numbness and headaches.  Hematological:  Does not bruise/bleed easily.    Immunization History  Administered Date(s) Administered   Fluad Quad(high Dose 65+) 05/16/2019, 05/31/2020, 06/06/2021, 06/16/2022   Influenza,inj,Quad PF,6+ Mos 06/17/2017, 06/09/2018   Influenza-Unspecified 06/11/2016   Moderna Covid-19 Vaccine Bivalent Booster 42yr & up 07/12/2021   Moderna Sars-Covid-2 Vaccination 07/06/2020   PFIZER(Purple Top)SARS-COV-2 Vaccination  10/20/2019, 11/14/2019   Pneumococcal Conjugate-13 05/20/2018   Pneumococcal Polysaccharide-23 02/15/2017   Tdap 09/21/2017   Zoster Recombinat (Shingrix) 07/19/2018, 02/13/2019   Pertinent  Health Maintenance Due  Topic Date Due   MAMMOGRAM  07/01/2024   COLONOSCOPY (Pts 45-454yrInsurance coverage will need to be confirmed)  12/17/2027   INFLUENZA VACCINE  Completed   DEXA SCAN  Completed      08/25/2021    8:39 AM 11/28/2021    1:05 PM 01/19/2022    9:11 AM 03/16/2022    8:37 AM 08/13/2022   10:11 AM  Fall Risk  Falls in the past year? 0 0 0 1 0  Was there an injury with Fall?  0 0 0 0  Fall Risk Category Calculator  0 0 1 0  Fall Risk Category  Low Low Low Low  Patient Fall Risk Level Low fall risk Low fall risk Low fall risk Low fall risk Low  fall risk  Patient at Risk for Falls Due to No Fall Risks No Fall Risks No Fall Risks History of fall(s) No Fall Risks  Fall risk Follow up _0    Functional Status Survey:    Vitals:   08/13/22 1008  BP: 130/82  Pulse: 77  Resp: 17  Temp: 97.7 F (36.5 C)  TempSrc: Temporal  SpO2: 96%  Weight: 181 lb 12.8 oz (82.5 kg)  Height: _1  (1.702 m)   Body mass index is 28.47 kg/m. Physical Exam Vitals reviewed.  Constitutional:      General: She is not in acute distress.    Appearance: Normal appearance. She is overweight. She is not ill-appearing or diaphoretic.  HENT:     Head: Normocephalic.  Eyes:     General: No scleral icterus.       Right eye: No discharge.        Left eye: No discharge.     Conjunctiva/sclera: Conjunctivae normal.     Pupils: Pupils are equal, round, and reactive to light.  Cardiovascular:     Rate and Rhythm: Normal rate and regular rhythm.     Pulses: Normal pulses.     Heart sounds: Normal heart sounds. No murmur heard.    No friction rub. No gallop.  Pulmonary:      Effort: Pulmonary effort is normal. No respiratory distress.     Breath sounds: Normal breath sounds. No wheezing, rhonchi or rales.  Chest:     Chest wall: No tenderness.  Musculoskeletal:        General: Normal range of motion.     Right hand: Tenderness present. No swelling. Normal range of motion. Normal strength. Normal sensation. Normal capillary refill. Normal pulse.     Left hand: No swelling. Normal range of motion. Normal sensation. There is no disruption of two-point discrimination. Normal capillary refill. Normal pulse.     Right lower leg: No edema.     Left lower leg: No edema.     Comments: Bilateral middle base of fingers swelling and tender without any erythema left greater than the right finger.  Skin:    General: Skin is warm and dry.     Coloration: Skin is not pale.     Findings: No bruising, erythema, lesion or rash.  Neurological:     Mental Status: She is alert and oriented to person, place, and time.     Cranial Nerves: No cranial nerve deficit.     Sensory: No sensory deficit.     Motor: No weakness.     Coordination: Coordination normal.     Gait: Gait normal.  Psychiatric:        Mood and Affect: Mood normal.        Speech: Speech normal.        Behavior: Behavior normal.     Labs reviewed: Recent Labs    08/25/21 0922 01/19/22 0950 03/16/22 0916  NA 141 139 140  K 4.0 3.8 4.2  CL 101 101 101  CO2 35* 32 34*  GLUCOSE 90 87 104*  BUN _2 CREATININE 0.98 0.97 1.04*  CALCIUM 10.1 9.6 9.8   Recent Labs    08/25/21 0922 01/19/22 0950 03/16/22 0916  AST _3 ALT _4 BILITOT 0.9 0.8 0.8  PROT 6.4 6.5 6.6   Recent Labs    08/25/21 0922 01/19/22 0950 03/16/22  0916  WBC 7.7 7.7 7.3  NEUTROABS 2,972 3,034 2,679  HGB 13.9 14.0 14.8  HCT 41.1 41.1 43.7  MCV 92.6 93.0 93.4  PLT 257 261 239   Lab Results  Component Value Date   TSH 1.43 08/16/2017   No results found for: "HGBA1C" Lab Results  Component Value Date    CHOL 168 03/16/2022   HDL 48 (L) 03/16/2022   LDLCALC 89 03/16/2022   TRIG 213 (H) 03/16/2022   CHOLHDL 3.5 03/16/2022    Significant Diagnostic Results in last 30 days:  No results found.  Assessment/Plan Swelling of left middle finger Afebrile  Bilateral middle base of fingers swelling and tender without any erythema left greater than the right finger. Unclear etiology denies overuse of hand or callus. Discussed referral to hand specialist for further evaluation.  - Ambulatory referral to Hand Surgery  Family/ staff Communication: Reviewed plan of care with patient verbalized understanding  Labs/tests ordered: None   Next Appointment: Return in about 9 weeks (around 10/15/2022) for medical mangement of chronic issues.Sandrea Hughs, NP

## 2022-08-17 ENCOUNTER — Ambulatory Visit (INDEPENDENT_AMBULATORY_CARE_PROVIDER_SITE_OTHER): Payer: PPO | Admitting: Nurse Practitioner

## 2022-08-17 ENCOUNTER — Encounter: Payer: Self-pay | Admitting: Nurse Practitioner

## 2022-08-17 VITALS — Ht 67.0 in | Wt 181.0 lb

## 2022-08-17 DIAGNOSIS — Z0000 Encounter for general adult medical examination without abnormal findings: Secondary | ICD-10-CM

## 2022-08-17 NOTE — Patient Instructions (Signed)
Maria Adkins , Thank you for taking time to come for your Medicare Wellness Visit. I appreciate your ongoing commitment to your health goals. Please review the following plan we discussed and let me know if I can assist you in the future.   Screening recommendations/referrals: Colonoscopy up to date Mammogram up to date Bone Density up to date Recommended yearly ophthalmology/optometry visit for glaucoma screening and checkup Recommended yearly dental visit for hygiene and checkup  Vaccinations: Influenza vaccine- due annually in September/October Pneumococcal vaccine up to date Tdap vaccine up to date Shingles vaccine up to date    Advanced directives: recommended to complete and place on file.   Conditions/risks identified: advanced age, hyperlipidemia, hypertension a Next appointment: yearly   Preventive Care 16 Years and Older, Female Preventive care refers to lifestyle choices and visits with your health care provider that can promote health and wellness. What does preventive care include? A yearly physical exam. This is also called an annual well check. Dental exams once or twice a year. Routine eye exams. Ask your health care provider how often you should have your eyes checked. Personal lifestyle choices, including: Daily care of your teeth and gums. Regular physical activity. Eating a healthy diet. Avoiding tobacco and drug use. Limiting alcohol use. Practicing safe sex. Taking low-dose aspirin every day. Taking vitamin and mineral supplements as recommended by your health care provider. What happens during an annual well check? The services and screenings done by your health care provider during your annual well check will depend on your age, overall health, lifestyle risk factors, and family history of disease. Counseling  Your health care provider may ask you questions about your: Alcohol use. Tobacco use. Drug use. Emotional well-being. Home and relationship  well-being. Sexual activity. Eating habits. History of falls. Memory and ability to understand (cognition). Work and work Statistician. Reproductive health. Screening  You may have the following tests or measurements: Height, weight, and BMI. Blood pressure. Lipid and cholesterol levels. These may be checked every 5 years, or more frequently if you are over 46 years old. Skin check. Lung cancer screening. You may have this screening every year starting at age 64 if you have a 30-pack-year history of smoking and currently smoke or have quit within the past 15 years. Fecal occult blood test (FOBT) of the stool. You may have this test every year starting at age 42. Flexible sigmoidoscopy or colonoscopy. You may have a sigmoidoscopy every 5 years or a colonoscopy every 10 years starting at age 32. Hepatitis C blood test. Hepatitis B blood test. Sexually transmitted disease (STD) testing. Diabetes screening. This is done by checking your blood sugar (glucose) after you have not eaten for a while (fasting). You may have this done every 1-3 years. Bone density scan. This is done to screen for osteoporosis. You may have this done starting at age 34. Mammogram. This may be done every 1-2 years. Talk to your health care provider about how often you should have regular mammograms. Talk with your health care provider about your test results, treatment options, and if necessary, the need for more tests. Vaccines  Your health care provider may recommend certain vaccines, such as: Influenza vaccine. This is recommended every year. Tetanus, diphtheria, and acellular pertussis (Tdap, Td) vaccine. You may need a Td booster every 10 years. Zoster vaccine. You may need this after age 57. Pneumococcal 13-valent conjugate (PCV13) vaccine. One dose is recommended after age 39. Pneumococcal polysaccharide (PPSV23) vaccine. One dose is recommended after  age 50. Talk to your health care provider about which  screenings and vaccines you need and how often you need them. This information is not intended to replace advice given to you by your health care provider. Make sure you discuss any questions you have with your health care provider. Document Released: 09/27/2015 Document Revised: 05/20/2016 Document Reviewed: 07/02/2015 Elsevier Interactive Patient Education  2017 [place] Prevention in the Home Falls can cause injuries. They can happen to people of all ages. There are many things you can do to make your home safe and to help prevent falls. What can I do on the outside of my home? Regularly fix the edges of walkways and driveways and fix any cracks. Remove anything that might make you trip as you walk through a door, such as a raised step or threshold. Trim any bushes or trees on the path to your home. Use bright outdoor lighting. Clear any walking paths of anything that might make someone trip, such as rocks or tools. Regularly check to see if handrails are loose or broken. Make sure that both sides of any steps have handrails. Any raised decks and porches should have guardrails on the edges. Have any leaves, snow, or ice cleared regularly. Use sand or salt on walking paths during winter. Clean up any spills in your garage right away. This includes oil or grease spills. What can I do in the bathroom? Use night lights. Install grab bars by the toilet and in the tub and shower. Do not use towel bars as grab bars. Use non-skid mats or decals in the tub or shower. If you need to sit down in the shower, use a plastic, non-slip stool. Keep the floor dry. Clean up any water that spills on the floor as soon as it happens. Remove soap buildup in the tub or shower regularly. Attach bath mats securely with double-sided non-slip rug tape. Do not have throw rugs and other things on the floor that can make you trip. What can I do in the bedroom? Use night lights. Make sure that you have a  light by your bed that is easy to reach. Do not use any sheets or blankets that are too big for your bed. They should not hang down onto the floor. Have a firm chair that has side arms. You can use this for support while you get dressed. Do not have throw rugs and other things on the floor that can make you trip. What can I do in the kitchen? Clean up any spills right away. Avoid walking on wet floors. Keep items that you use a lot in easy-to-reach places. If you need to reach something above you, use a strong step stool that has a grab bar. Keep electrical cords out of the way. Do not use floor polish or wax that makes floors slippery. If you must use wax, use non-skid floor wax. Do not have throw rugs and other things on the floor that can make you trip. What can I do with my stairs? Do not leave any items on the stairs. Make sure that there are handrails on both sides of the stairs and use them. Fix handrails that are broken or loose. Make sure that handrails are as long as the stairways. Check any carpeting to make sure that it is firmly attached to the stairs. Fix any carpet that is loose or worn. Avoid having throw rugs at the top or bottom of the stairs. If you do  have throw rugs, attach them to the floor with carpet tape. Make sure that you have a light switch at the top of the stairs and the bottom of the stairs. If you do not have them, ask someone to add them for you. What else can I do to help prevent falls? Wear shoes that: Do not have high heels. Have rubber bottoms. Are comfortable and fit you well. Are closed at the toe. Do not wear sandals. If you use a stepladder: Make sure that it is fully opened. Do not climb a closed stepladder. Make sure that both sides of the stepladder are locked into place. Ask someone to hold it for you, if possible. Clearly mark and make sure that you can see: Any grab bars or handrails. First and last steps. Where the edge of each step  is. Use tools that help you move around (mobility aids) if they are needed. These include: Canes. Walkers. Scooters. Crutches. Turn on the lights when you go into a dark area. Replace any light bulbs as soon as they burn out. Set up your furniture so you have a clear path. Avoid moving your furniture around. If any of your floors are uneven, fix them. If there are any pets around you, be aware of where they are. Review your medicines with your doctor. Some medicines can make you feel dizzy. This can increase your chance of falling. Ask your doctor what other things that you can do to help prevent falls. This information is not intended to replace advice given to you by your health care provider. Make sure you discuss any questions you have with your health care provider. Document Released: 06/27/2009 Document Revised: 02/06/2016 Document Reviewed: 10/05/2014 Elsevier Interactive Patient Education  2017 [employer].

## 2022-08-17 NOTE — Progress Notes (Signed)
Subjective:   Maria Adkins is a 71 y.o. female who presents for Medicare Annual (Subsequent) preventive examination.  Review of Systems     Cardiac Risk Factors include: advanced age (>72mn, >>37women);hypertension;dyslipidemia     Objective:    Today's Vitals   08/17/22 1304  Weight: 181 lb (82.1 kg)  Height: _0  (1.702 m)   Body mass index is 28.35 kg/m.     08/17/2022    1:15 PM 08/13/2022   10:11 AM 01/19/2022    9:11 AM 11/28/2021    1:05 PM 08/01/2021    1:10 PM 03/28/2021    8:36 AM 09/25/2020    8:33 AM  Advanced Directives  Does Patient Have a Medical Advance Directive? No Yes _1   Type of [place]porate [place]f [place] will;Out of facility DNR (pink MOST or yellow form)       Does patient want to make changes to medical advance directive?  No - Patient declined     Yes (MAU/Ambulatory/Procedural Areas - Information given)  Would patient like information on creating a medical advance directive?    No - Patient declined  No - Patient declined     Current Medications (verified) Outpatient Encounter Medications as of 08/17/2022  Medication Sig   albuterol (PROVENTIL) (2.5 MG/3ML) 0.083% nebulizer solution Take 3 mLs (2.5 mg total) by nebulization every 4 (four) hours as needed for wheezing or shortness of breath.   albuterol (VENTOLIN HFA) 108 (90 Base) MCG/ACT inhaler INHALE 2 PUFFS BY MOUTH INTO THE LUNGS EVERY 4 HOURS AS NEEDED   ascorbic acid (VITAMIN C) 500 MG tablet Take by mouth.   Blood Pressure Monitoring (BLOOD PRESSURE CUFF) MISC 1 Device by Does not apply route daily. DX: I10   Calcium Carbonate (CALCIUM 600 PO) Take 1 tablet by mouth daily.    cetirizine (ZYRTEC) 10 MG tablet TAKE 1 TABLET (10 MG TOTAL) BY MOUTH DAILY.   Cholecalciferol (VITAMIN D) 125 MCG (5000 UT) CAPS Take by mouth.   ELDERBERRY PO Take by mouth.   enalapril (VASOTEC) 20 MG tablet Take 1 tablet (20 mg total) by mouth daily.    fluticasone-salmeterol (ADVAIR HFA) 115-21 MCG/ACT inhaler Inhale 1 puff into the lungs 2 (two) times daily.   hydrochlorothiazide (HYDRODIURIL) 25 MG tablet Take 1 tablet (25 mg total) by mouth daily.   metoprolol tartrate (LOPRESSOR) 100 MG tablet Take 1 tablet (100 mg total) by mouth daily.   montelukast (SINGULAIR) 10 MG tablet TAKE 1 TABLET (10 MG TOTAL) BY MOUTH AT BEDTIME.   Multiple Vitamins-Minerals (MULTIVITAMIN GUMMIES ADULT PO) Take 2 tablets by mouth daily. Gummy   Multiple Vitamins-Minerals (ZINC PO) Take by mouth.   polyethylene glycol (MIRALAX / GLYCOLAX) 17 g packet Take 17 g by mouth daily.   pravastatin (PRAVACHOL) 20 MG tablet TAKE 1 TABLET BY MOUTH ONCE DAILY   sertraline (ZOLOFT) 25 MG tablet Take 1 tablet (25 mg total) by mouth daily.   Spacer/Aero-Holding Chambers DEVI Use with advair HFA   SPIKEVAX syringe Inject 0.5 mLs into the muscle once.   bictegravir-emtricitabine-tenofovir AF (BIKTARVY) 50-200-25 MG TABS tablet TAKE 1 TABLET BY MOUTH DAILY.   Biotin w/ Vitamins C & E (HAIR/SKIN/NAILS PO) Take by mouth.   Dupilumab (DUPIXENT) 300 MG/2ML SOPN Inject 300 mg into the skin every 14 (fourteen) days.   terbinafine (LAMISIL) 250 MG tablet Take 1 tablet (250 mg total) by mouth daily.   traZODone (DESYREL) 50 MG tablet Take 1/2-1  tablet (25-50 mg total) by mouth at bedtime as needed for sleep. (Patient not taking: Reported on 08/17/2022)   vitamin B-12 (CYANOCOBALAMIN) 500 MCG tablet Take 500 mcg by mouth daily.   [DISCONTINUED] linaclotide (LINZESS) 145 MCG CAPS capsule Take 1 capsule (145 mcg total) by mouth daily before breakfast.   Facility-Administered Encounter Medications as of 08/17/2022  Medication   0.9 %  sodium chloride infusion    Allergies (verified) Patient has no known allergies.   History: Past Medical History:  Diagnosis Date   Allergy    seasonal allergies   Asthma    uses unhaler   Blood transfusion without reported diagnosis 1990's    Cataract    bilateral sx   Depression    on meds   High blood pressure    on meds   HIV (human immunodeficiency virus infection) ([place])    on meds   Hx of adenomatous polyp of colon 2015   Hyperlipidemia    on meds   Osteoarthritis    Osteopenia    Past Surgical History:  Procedure Laterality Date   Biopsy of Liver     CATARACT EXTRACTION, BILATERAL     CHOLECYSTECTOMY     COLONOSCOPY  2015   [facility]-Dr.Liakos-prep(exc)-TA x 1   CYST REMOVAL HAND Left    CYSTECTOMY     off of back   LAPAROSCOPIC SALPINGOOPHERECTOMY Right    POLYPECTOMY  2015   TA   Family History  Problem Relation Age of Onset   Diabetes Mother        died at age 29   Hypertension Mother    Asthma Sister    Diabetes Sister    Hypertension Sister    Arthritis Sister    Diabetes Sister    Hypertension Sister    Asthma Sister    Arthritis Sister    Heart defect Sister    Asthma Son        controlled   Crohn's disease Niece    Colon polyps Neg Hx    Colon cancer Neg Hx    Esophageal cancer Neg Hx    Stomach cancer Neg Hx    Social History   Socioeconomic History   Marital status: Married    Spouse name: Not on file   Number of children: Not on file   Years of education: Not on file   Highest education level: Not on file  Occupational History   Not on file  Tobacco Use   Smoking status: Former    Packs/day: 1.00    Years: 14.00    Total pack years: 14.00    Types: Cigarettes    Quit date: 09/14/1978    Years since quitting: 43.9   Smokeless tobacco: Never  Vaping Use   Vaping Use: Never used  Substance and Sexual Activity   Alcohol use: No   Drug use: No   Sexual activity: Yes    Partners: Male    Birth control/protection: Condom    Comment: declined condoms  Other Topics Concern   Not on file  Social History Narrative   Diet? Regular      Do you drink/eat things with caffeine? Coffee      Marital status?  Married                                  What year were you married?  March 16, 2009  Do you live in a house, apartment, assisted living, condo, trailer, etc.? House      Is it one or more stories? 1 story      How many persons live in your home? 2      Do you have any pets in your home? (please list) yes-dog      Current or past profession: Administrator      Do you exercise?  no                                    Type & how often? n/a      Do you have a living will? no      Do you have a DNR form?     no                             If not, do you want to discuss one? Yes      Do you have signed POA/HPOA for forms? no      Social Determinants of Health   Financial Resource Strain: Low Risk  (07/11/2018)   Overall Financial Resource Strain (CARDIA)    Difficulty of Paying Living Expenses: Not hard at all  Food Insecurity: No Food Insecurity (07/11/2018)   Hunger Vital Sign    Worried About Running Out of Food in the Last Year: Never true    Ran Out of Food in the Last Year: Never true  Transportation Needs: No Transportation Needs (07/11/2018)   PRAPARE - Hydrologist (Medical): No    Lack of Transportation (Non-Medical): No  Physical Activity: Inactive (07/11/2018)   Exercise Vital Sign    Days of Exercise per Week: 0 days    Minutes of Exercise per Session: 0 min  Stress: Stress Concern Present (07/11/2018)   [place]    Feeling of Stress : To some extent  Social Connections: Moderately Integrated (07/11/2018)   Social Connection and Isolation Panel [NHANES]    Frequency of Communication with Friends and Family: More than three times a week    Frequency of Social Gatherings with Friends and Family: More than three times a week    Attends Religious Services: More than 4 times per year    Active Member of [employer] or Organizations: No    Attends Music therapist: Never    Marital Status: Married    Tobacco Counseling Counseling  given: Not Answered   Clinical Intake:     Pain : No/denies pain     BMI - recorded: 28 Nutritional Status: BMI 25 -29 Overweight Diabetes: No  How often do you need to have someone help you when you read instructions, pamphlets, or other written materials from your doctor or pharmacy?: 1 - Never  Diabetic?no  Interpreter Needed?: No      Activities of Daily Living    08/17/2022    1:21 PM 08/17/2022    1:10 PM  In your present state of health, do you have any difficulty performing the following activities:  Hearing?  0  Vision?  0  Difficulty concentrating or making decisions?  0  Walking or climbing stairs?  0  Dressing or bathing?  0  Doing errands, shopping?  0  Preparing Food and eating ? N Y  Using the Toilet? Aggie Moats  In the past six months, have you accidently leaked urine?  N  Do you have problems with loss of bowel control?  N  Managing your Medications? N Y  Managing your Finances? N N  Housekeeping or managing your Housekeeping? N Y    Patient Care Team: Lauree Chandler, NP as PCP - General (Geriatric Medicine)  Indicate any recent Medical Services you may have received from other than [facility] providers in the past year (date may be approximate).     Assessment:   This is a routine wellness examination for Maria Adkins.  Hearing/Vision screen No results found.  Dietary issues and exercise activities discussed: Current Exercise Habits: Home exercise routine, Type of exercise: walking, Time (Minutes): 45, Frequency (Times/Week): 3, Weekly Exercise (Minutes/Week): 135, Intensity: Mild   Goals Addressed   None   Depression Screen    08/17/2022    1:14 PM 08/13/2022   10:11 AM 03/16/2022    8:38 AM 08/25/2021    8:39 AM 08/01/2021    1:09 PM 02/19/2021    9:00 AM 10/28/2020    9:05 AM  PHQ 2/9 Scores  PHQ - 2 Score 0 0 1 0 0 0 0    Fall Risk    08/17/2022    1:15 PM 08/13/2022   10:11 AM 03/16/2022    8:37 AM 01/19/2022    9:11 AM 11/28/2021    1:05  PM  [place] in the past year? 0 0 1 0 0  Comment   Chair slid out    Number falls in past yr:  0 0 0 0  Injury with Fall?  0 0 0 0  Risk for fall due to :  No Fall Risks History of fall(s) No Fall Risks No Fall Risks  Follow up _0     FALL RISK PREVENTION PERTAINING TO THE HOME:  Any stairs in or around the home? No  If so, are there any without handrails?  na Home free of loose throw rugs in walkways, pet beds, electrical cords, etc? Yes  Adequate lighting in your home to reduce risk of falls? Yes   ASSISTIVE DEVICES UTILIZED TO PREVENT FALLS:  Life alert? No  Use of a cane, walker or w/c? No  Grab bars in the bathroom? Yes  Shower chair or bench in shower? Yes  Elevated toilet seat or a handicapped toilet? Yes   TIMED UP AND GO:  Was the test performed? No .    Cognitive Function:    07/11/2018   11:48 AM 09/16/2016   11:05 AM  MMSE - Mini Mental State Exam  Orientation to time 5 5  Orientation to Place 5 5  Registration 3 3  Attention/ Calculation 5 5  Recall 2 3  Language- name 2 objects 2 2  Language- repeat 1 1  Language- follow 3 step command 3 3  Language- read & follow direction 1 1  Write a sentence 1 1  Copy design 1 1  Total score 29 30        08/17/2022    1:13 PM 08/01/2021    1:11 PM 07/30/2020    9:05 AM 07/14/2019    8:45 AM  6CIT Screen  What Year? 0 points 0 points 0 points 0 points  What month? 0 points 0 points 0 points 0 points  What time? 0 points 0 points 0 points 0 points  Count  back from 20 0 points 0 points 0 points 0 points  Months in reverse 2 points 0 points 0 points 0 points  Repeat phrase 0 points 0 points 0 points 0 points  Total Score 2 points 0 points 0 points 0 points    Immunizations Immunization History  Administered Date(s) Administered   Fluad Quad(high Dose 65+)  05/16/2019, 05/31/2020, 06/06/2021, 06/16/2022   Influenza,inj,Quad PF,6+ Mos 06/17/2017, 06/09/2018   Influenza-Unspecified 06/11/2016   Moderna Covid-19 Vaccine Bivalent Booster 50yr & up 07/12/2021   Moderna Sars-Covid-2 Vaccination 07/06/2020   PFIZER(Purple Top)SARS-COV-2 Vaccination 10/20/2019, 11/14/2019   Pneumococcal Conjugate-13 05/20/2018   Pneumococcal Polysaccharide-23 02/15/2017   Tdap 09/21/2017   Zoster Recombinat (Shingrix) 07/19/2018, 02/13/2019    TDAP status: Up to date  Flu Vaccine status: Up to date  Pneumococcal vaccine status: Up to date  Covid-19 vaccine status: Information provided on how to obtain vaccines.   Qualifies for Shingles Vaccine? Yes   Zostavax completed No   Shingrix Completed?: Yes  Screening Tests Health Maintenance  Topic Date Due   COVID-19 Vaccine (5 - 2023-24 season) 05/15/2022   Medicare Annual Wellness (AWV)  08/18/2023   MAMMOGRAM  07/01/2024   DTaP/Tdap/Td (2 - Td or Tdap) 09/22/2027   COLONOSCOPY (Pts 45-486yrInsurance coverage will need to be confirmed)  12/17/2027   Pneumonia Vaccine 6530Years old  Completed   INFLUENZA VACCINE  Completed   DEXA SCAN  Completed   Hepatitis C Screening  Completed   Zoster Vaccines- Shingrix  Completed   HPV VACCINES  Aged Out    Health Maintenance  Health Maintenance Due  Topic Date Due   COVID-19 Vaccine (5 - 2023-24 season) 05/15/2022    Colorectal cancer screening: Type of screening: Colonoscopy. Completed 2022. Repeat every 7 years  Mammogram status: Completed 07/06/22. Repeat every year  Bone Density status: Completed 2022. Results reflect: Bone density results: OSTEOPENIA. Repeat every 2 years.  Lung Cancer Screening: (Low Dose CT Chest recommended if Age 73-80ears, 30 pack-year currently smoking OR have quit w/in 15years.) does not qualify.   Lung Cancer Screening Referral: na  Additional Screening:  Hepatitis C Screening: does qualify; Completed 2018  Vision  Screening: Recommended annual ophthalmology exams for early detection of glaucoma and other disorders of the eye. Is the patient up to date with their annual eye exam?  Yes  Who is the provider or what is the name of the office in which the patient attends annual eye exams? [facility] If pt is not established with a provider, would they like to be referred to a provider to establish care? No .   Dental Screening: Recommended annual dental exams for proper oral hygiene  Community Resource Referral / Chronic Care Management: CRR required this visit?  No   CCM required this visit?  No      Plan:     I have personally reviewed and noted the following in the patient's chart:   Medical and social history Use of alcohol, tobacco or illicit drugs  Current medications and supplements including opioid prescriptions. Patient is not currently taking opioid prescriptions. Functional ability and status Nutritional status Physical activity Advanced directives List of other physicians Hospitalizations, surgeries, and ER visits in previous 12 months Vitals Screenings to include cognitive, depression, and falls Referrals and appointments  In addition, I have reviewed and discussed with patient certain preventive protocols, quality metrics, and best practice recommendations. A written personalized care plan for preventive services as well as general preventive  health recommendations were provided to patient.     Lauree Chandler, NP   08/17/2022    Virtual Visit via Telephone Note  I connected with patient 08/17/22 at  2:00 PM EST by telephone and verified that I am speaking with the correct person using two identifiers.  Location: Patient: home Provider: [place]   I discussed the limitations, risks, security and privacy concerns of performing an evaluation and management service by telephone and the availability of in person appointments. I also discussed with the patient that there may  be a patient responsible charge related to this service. The patient expressed understanding and agreed to proceed.   I discussed the assessment and treatment plan with the patient. The patient was provided an opportunity to ask questions and all were answered. The patient agreed with the plan and demonstrated an understanding of the instructions.   The patient was advised to call back or seek an in-person evaluation if the symptoms worsen or if the condition fails to improve as anticipated.  I provided 15 minutes of non-face-to-face time during this encounter.  Carlos American. Harle Battiest Avs printed and mailed

## 2022-08-18 ENCOUNTER — Other Ambulatory Visit (HOSPITAL_COMMUNITY): Payer: Self-pay

## 2022-08-18 ENCOUNTER — Other Ambulatory Visit: Payer: Self-pay

## 2022-08-26 ENCOUNTER — Telehealth: Payer: Self-pay

## 2022-08-26 NOTE — Telephone Encounter (Signed)
Contacted pt and discussed PAP re-enrollment for calendar year 2024. Pt requested renewal form be mailed to them. Address confirmed and placed in outgoing mailbox up front. Will await return of application. 

## 2022-09-08 ENCOUNTER — Other Ambulatory Visit (HOSPITAL_BASED_OUTPATIENT_CLINIC_OR_DEPARTMENT_OTHER): Payer: Self-pay

## 2022-09-10 ENCOUNTER — Other Ambulatory Visit (HOSPITAL_COMMUNITY): Payer: Self-pay

## 2022-09-18 ENCOUNTER — Other Ambulatory Visit (HOSPITAL_COMMUNITY): Payer: Self-pay

## 2022-09-18 NOTE — Telephone Encounter (Signed)
Submitted Patient Assistance Application to [employer] for DU[employer] along with provider portion, PA and income documents. Will update patient when we receive a response. ° °Fax# [phone] °Phone# [phone] ° °

## 2022-09-21 ENCOUNTER — Ambulatory Visit (INDEPENDENT_AMBULATORY_CARE_PROVIDER_SITE_OTHER): Payer: PPO | Admitting: Internal Medicine

## 2022-09-21 ENCOUNTER — Encounter: Payer: Self-pay | Admitting: Internal Medicine

## 2022-09-21 ENCOUNTER — Other Ambulatory Visit: Payer: Self-pay

## 2022-09-21 ENCOUNTER — Other Ambulatory Visit (HOSPITAL_BASED_OUTPATIENT_CLINIC_OR_DEPARTMENT_OTHER): Payer: Self-pay

## 2022-09-21 ENCOUNTER — Other Ambulatory Visit: Payer: Self-pay | Admitting: Internal Medicine

## 2022-09-21 ENCOUNTER — Other Ambulatory Visit (HOSPITAL_COMMUNITY): Payer: Self-pay

## 2022-09-21 VITALS — BP 130/96 | HR 71 | Temp 98.4°F | Ht 67.0 in | Wt 180.0 lb

## 2022-09-21 DIAGNOSIS — E782 Mixed hyperlipidemia: Secondary | ICD-10-CM | POA: Diagnosis not present

## 2022-09-21 DIAGNOSIS — I10 Essential (primary) hypertension: Secondary | ICD-10-CM | POA: Diagnosis not present

## 2022-09-21 DIAGNOSIS — B20 Human immunodeficiency virus [HIV] disease: Secondary | ICD-10-CM | POA: Diagnosis not present

## 2022-09-21 DIAGNOSIS — Z79899 Other long term (current) drug therapy: Secondary | ICD-10-CM

## 2022-09-21 MED ORDER — BIKTARVY 50-200-25 MG PO TABS
1.0000 | ORAL_TABLET | Freq: Every day | ORAL | 11 refills | Status: DC
  Filled 2022-09-21: qty 30, 30d supply, fill #0
  Filled 2022-10-13: qty 30, 30d supply, fill #1
  Filled 2022-11-05: qty 30, 30d supply, fill #2
  Filled 2022-12-03: qty 30, 30d supply, fill #3
  Filled 2023-01-19: qty 30, 30d supply, fill #4
  Filled 2023-02-10: qty 30, 30d supply, fill #5
  Filled 2023-03-10: qty 30, 30d supply, fill #6
  Filled 2023-04-06: qty 30, 30d supply, fill #7
  Filled 2023-05-05: qty 30, 30d supply, fill #8
  Filled 2023-05-31: qty 30, 30d supply, fill #9
  Filled 2023-07-08: qty 30, 30d supply, fill #10
  Filled 2023-08-04: qty 30, 30d supply, fill #11

## 2022-09-21 MED ORDER — AREXVY 120 MCG/0.5ML IM SUSR
INTRAMUSCULAR | 0 refills | Status: DC
  Filled 2022-09-21: qty 0.5, 1d supply, fill #0

## 2022-09-21 MED ORDER — FLUTICASONE-SALMETEROL 115-21 MCG/ACT IN AERO
1.0000 | INHALATION_SPRAY | Freq: Two times a day (BID) | RESPIRATORY_TRACT | 12 refills | Status: DC
  Filled 2022-09-21: qty 12, 30d supply, fill #0
  Filled 2023-01-22: qty 12, 30d supply, fill #1

## 2022-09-21 NOTE — Progress Notes (Signed)
RFV: follow up for hiv disease  [name], female   DOB: 15-Jul-1951, 72 y.o.   MRN: [id number]  HPI Maria Adkins is a 72yo F with wel lcontrolled hiv disease, HTN, HLD where she reports that she has recovered from respiratory illness/flu-like for roughly 7-10 days. Had fatigue, myalgias, headache, sore throat, and pharyngitis. Now back to baseline.  Outpatient Encounter Medications as of 09/21/2022  Medication Sig   albuterol (PROVENTIL) (2.5 MG/3ML) 0.083% nebulizer solution Take 3 mLs (2.5 mg total) by nebulization every 4 (four) hours as needed for wheezing or shortness of breath.   albuterol (VENTOLIN HFA) 108 (90 Base) MCG/ACT inhaler INHALE 2 PUFFS BY MOUTH INTO THE LUNGS EVERY 4 HOURS AS NEEDED   ascorbic acid (VITAMIN C) 500 MG tablet Take by mouth.   bictegravir-emtricitabine-tenofovir AF (BIKTARVY) 50-200-25 MG TABS tablet TAKE 1 TABLET BY MOUTH DAILY.   Biotin w/ Vitamins C & E (HAIR/SKIN/NAILS PO) Take by mouth.   Blood Pressure Monitoring (BLOOD PRESSURE CUFF) MISC 1 Device by Does not apply route daily. DX: I10   Calcium Carbonate (CALCIUM 600 PO) Take 1 tablet by mouth daily.    cetirizine (ZYRTEC) 10 MG tablet TAKE 1 TABLET (10 MG TOTAL) BY MOUTH DAILY.   Cholecalciferol (VITAMIN D) 125 MCG (5000 UT) CAPS Take by mouth.   Dupilumab (DUPIXENT) 300 MG/2ML SOPN Inject 300 mg into the skin every 14 (fourteen) days.   ELDERBERRY PO Take by mouth.   enalapril (VASOTEC) 20 MG tablet Take 1 tablet (20 mg total) by mouth daily.   fluticasone-salmeterol (ADVAIR HFA) 115-21 MCG/ACT inhaler Inhale 1 puff into the lungs 2 (two) times daily.   hydrochlorothiazide (HYDRODIURIL) 25 MG tablet Take 1 tablet (25 mg total) by mouth daily.   metoprolol tartrate (LOPRESSOR) 100 MG tablet Take 1 tablet (100 mg total) by mouth daily.   montelukast (SINGULAIR) 10 MG tablet TAKE 1 TABLET (10 MG TOTAL) BY MOUTH AT BEDTIME.   Multiple Vitamins-Minerals (MULTIVITAMIN GUMMIES ADULT PO) Take  2 tablets by mouth daily. Gummy   pravastatin (PRAVACHOL) 20 MG tablet TAKE 1 TABLET BY MOUTH ONCE DAILY   sertraline (ZOLOFT) 25 MG tablet Take 1 tablet (25 mg total) by mouth daily.   traZODone (DESYREL) 50 MG tablet Take 1/2-1 tablet (25-50 mg total) by mouth at bedtime as needed for sleep.   vitamin B-12 (CYANOCOBALAMIN) 500 MCG tablet Take 500 mcg by mouth daily.   Multiple Vitamins-Minerals (ZINC PO) Take by mouth.   polyethylene glycol (MIRALAX / GLYCOLAX) 17 g packet Take 17 g by mouth daily. (Patient not taking: Reported on 09/21/2022)   Spacer/Aero-Holding Chambers DEVI Use with advair HFA   SPIKEVAX syringe Inject 0.5 mLs into the muscle once.   terbinafine (LAMISIL) 250 MG tablet Take 1 tablet (250 mg total) by mouth daily. (Patient not taking: Reported on 09/21/2022)   [DISCONTINUED] linaclotide (LINZESS) 145 MCG CAPS capsule Take 1 capsule (145 mcg total) by mouth daily before breakfast.   Facility-Administered Encounter Medications as of 09/21/2022  Medication   0.9 %  sodium chloride infusion     Patient Active Problem List   Diagnosis Date Noted   Depression, major, single episode, complete remission ([place]) 03/28/2021   Mixed hyperlipidemia 03/28/2021   Primary insomnia 03/28/2021   Depression, major, single episode, moderate ([place]) 03/28/2021   Chronic idiopathic constipation 03/28/2021   Seasonal allergies 01/13/2018   Asthma 02/05/2017   Allergy 12/16/2016   Age related osteoporosis 08/19/2016   Essential hypertension 08/19/2016  Human immunodeficiency virus (HIV) disease ([place]) 08/19/2016   Hx of adenomatous polyp of colon 2015     Health Maintenance Due  Topic Date Due   COVID-19 Vaccine (5 - 2023-24 season) 05/15/2022     Review of Systems  Constitutional: Negative for fever, chills, diaphoresis, activity change, appetite change, fatigue and unexpected weight change.  HENT: Negative for congestion, sore throat, rhinorrhea, sneezing, trouble swallowing and  sinus pressure.  Eyes: Negative for photophobia and visual disturbance.  Respiratory: Negative for cough, chest tightness, shortness of breath, wheezing and stridor.  Cardiovascular: Negative for chest pain, palpitations and leg swelling.  Gastrointestinal: Negative for nausea, vomiting, abdominal pain, diarrhea, constipation, blood in stool, abdominal distention and anal bleeding.  Genitourinary: Negative for dysuria, hematuria, flank pain and difficulty urinating.  Musculoskeletal: Negative for myalgias, back pain, joint swelling, arthralgias and gait problem.  Skin: Negative for color change, pallor, rash and wound.  Neurological: Negative for dizziness, tremors, weakness and light-headedness.  Hematological: Negative for adenopathy. Does not bruise/bleed easily.  Psychiatric/Behavioral: Negative for behavioral problems, confusion, sleep disturbance, dysphoric mood, decreased concentration and agitation.   Physical Exam   BP (!) 130/96   Pulse 71   Temp 98.4 F (36.9 C) (Temporal)   Ht '5\' 7"'$  (1.702 m)   Wt 180 lb (81.6 kg)   SpO2 98%   BMI 28.19 kg/m   Physical Exam  Constitutional:  oriented to person, place, and time. appears well-developed and well-nourished. No distress.  HENT: [place]/AT, PERRLA, no scleral icterus Mouth/Throat: Oropharynx is clear and moist. No oropharyngeal exudate.  Cardiovascular: Normal rate, regular rhythm and normal heart sounds. Exam reveals no gallop and no friction rub.  No murmur heard.  Pulmonary/Chest: Effort normal and breath sounds normal. No respiratory distress.  has no wheezes.  Neck = supple, no nuchal rigidity Abdominal: Soft. Bowel sounds are normal.  exhibits no distension. There is no tenderness.  Lymphadenopathy: no cervical adenopathy. No axillary adenopathy Neurological: alert and oriented to person, place, and time.  Skin: Skin is warm and dry. No rash noted. No erythema.  Psychiatric: a normal mood and affect.  behavior is normal.    Lab Results  Component Value Date   CD4TCELL 13 (L) 03/16/2022   Lab Results  Component Value Date   CD4TABS 620 08/25/2021   CD4TABS 475 01/28/2021   CD4TABS 526 07/17/2020   Lab Results  Component Value Date   HIV1RNAQUANT <20 (H) 03/16/2022   Lab Results  Component Value Date   HEPBSAB POS (A) 09/24/2016   Lab Results  Component Value Date   LABRPR NON-REACTIVE 07/17/2020    CBC Lab Results  Component Value Date   WBC 7.3 03/16/2022   RBC 4.68 03/16/2022   HGB 14.8 03/16/2022   HCT 43.7 03/16/2022   PLT 239 03/16/2022   MCV 93.4 03/16/2022   MCH 31.6 03/16/2022   MCHC 33.9 03/16/2022   RDW 12.9 03/16/2022   LYMPHSABS 3,577 03/16/2022   MONOABS 0.2 02/03/2020   EOSABS 358 03/16/2022    BMET Lab Results  Component Value Date   NA 140 03/16/2022   K 4.2 03/16/2022   CL 101 03/16/2022   CO2 34 (H) 03/16/2022   GLUCOSE 104 (H) 03/16/2022   BUN 14 03/16/2022   CREATININE 1.04 (H) 03/16/2022   CALCIUM 9.8 03/16/2022   GFRNONAA 64 01/28/2021   GFRAA 75 01/28/2021      Assessment and Plan HIV disease= will get labs and also check for kidney function  Long  term medication management = will check cr  Hyperlipidemia = will check lipid profile  Htn = well controlled on enalapril  Health maintenance = wants RSV vaccine- can get at pharmacy next door

## 2022-09-22 ENCOUNTER — Other Ambulatory Visit (HOSPITAL_COMMUNITY): Payer: Self-pay

## 2022-09-23 LAB — CBC WITH DIFFERENTIAL/PLATELET
Absolute Monocytes: 585 cells/uL (ref 200–950)
Basophils Absolute: 77 cells/uL (ref 0–200)
Basophils Relative: 0.9 %
Eosinophils Absolute: 206 cells/uL (ref 15–500)
Eosinophils Relative: 2.4 %
HCT: 39 % (ref 35.0–45.0)
Hemoglobin: 13.5 g/dL (ref 11.7–15.5)
Lymphs Abs: 4592 cells/uL — ABNORMAL HIGH (ref 850–3900)
MCH: 31 pg (ref 27.0–33.0)
MCHC: 34.6 g/dL (ref 32.0–36.0)
MCV: 89.7 fL (ref 80.0–100.0)
MPV: 9.8 fL (ref 7.5–12.5)
Monocytes Relative: 6.8 %
Neutro Abs: 3139 cells/uL (ref 1500–7800)
Neutrophils Relative %: 36.5 %
Platelets: 266 10*3/uL (ref 140–400)
RBC: 4.35 10*6/uL (ref 3.80–5.10)
RDW: 13 % (ref 11.0–15.0)
Total Lymphocyte: 53.4 %
WBC: 8.6 10*3/uL (ref 3.8–10.8)

## 2022-09-23 LAB — HELPER T-LYMPH-CD4 (ARMC ONLY)
% CD 4 Pos. Lymph.: 9.6 % — ABNORMAL LOW (ref 30.8–58.5)
Absolute CD 4 Helper: 442 /uL (ref 359–1519)
Basophils Absolute: 0.1 10*3/uL (ref 0.0–0.2)
Basos: 1 %
EOS (ABSOLUTE): 0.2 10*3/uL (ref 0.0–0.4)
Eos: 2 %
Hematocrit: 41.1 % (ref 34.0–46.6)
Hemoglobin: 13.6 g/dL (ref 11.1–15.9)
Immature Grans (Abs): 0 10*3/uL (ref 0.0–0.1)
Immature Granulocytes: 0 %
Lymphocytes Absolute: 4.6 10*3/uL — ABNORMAL HIGH (ref 0.7–3.1)
Lymphs: 54 %
MCH: 31.1 pg (ref 26.6–33.0)
MCHC: 33.1 g/dL (ref 31.5–35.7)
MCV: 94 fL (ref 79–97)
Monocytes Absolute: 0.6 10*3/uL (ref 0.1–0.9)
Monocytes: 6 %
Neutrophils Absolute: 3.2 10*3/uL (ref 1.4–7.0)
Neutrophils: 37 %
Platelets: 285 10*3/uL (ref 150–450)
RBC: 4.38 x10E6/uL (ref 3.77–5.28)
RDW: 12.9 % (ref 11.7–15.4)
WBC: 8.6 10*3/uL (ref 3.4–10.8)

## 2022-09-23 LAB — COMPLETE METABOLIC PANEL WITH GFR
AG Ratio: 1.7 (calc) (ref 1.0–2.5)
ALT: 16 U/L (ref 6–29)
AST: 18 U/L (ref 10–35)
Albumin: 4.2 g/dL (ref 3.6–5.1)
Alkaline phosphatase (APISO): 59 U/L (ref 37–153)
BUN: 14 mg/dL (ref 7–25)
CO2: 32 mmol/L (ref 20–32)
Calcium: 9.8 mg/dL (ref 8.6–10.4)
Chloride: 102 mmol/L (ref 98–110)
Creat: 0.97 mg/dL (ref 0.60–1.00)
Globulin: 2.5 g/dL (calc) (ref 1.9–3.7)
Glucose, Bld: 86 mg/dL (ref 65–99)
Potassium: 3.8 mmol/L (ref 3.5–5.3)
Sodium: 143 mmol/L (ref 135–146)
Total Bilirubin: 0.5 mg/dL (ref 0.2–1.2)
Total Protein: 6.7 g/dL (ref 6.1–8.1)
eGFR: 62 mL/min/{1.73_m2} (ref >=60)

## 2022-09-23 LAB — LIPID PANEL
Cholesterol: 159 mg/dL (ref <200)
HDL: 47 mg/dL — ABNORMAL LOW (ref >=50)
LDL Cholesterol (Calc): 90 mg/dL (calc)
Non-HDL Cholesterol (Calc): 112 mg/dL (calc) (ref <130)
Total CHOL/HDL Ratio: 3.4 (calc) (ref <5.0)
Triglycerides: 123 mg/dL (ref <150)

## 2022-09-23 LAB — HIV-1 RNA QUANT-NO REFLEX-BLD
HIV 1 RNA Quant: NOT DETECTED Copies/mL
HIV-1 RNA Quant, Log: NOT DETECTED Log cps/mL

## 2022-09-23 LAB — RPR: RPR Ser Ql: NONREACTIVE

## 2022-10-01 ENCOUNTER — Other Ambulatory Visit (HOSPITAL_BASED_OUTPATIENT_CLINIC_OR_DEPARTMENT_OTHER): Payer: Self-pay

## 2022-10-05 ENCOUNTER — Other Ambulatory Visit (HOSPITAL_COMMUNITY): Payer: Self-pay

## 2022-10-06 ENCOUNTER — Other Ambulatory Visit (HOSPITAL_BASED_OUTPATIENT_CLINIC_OR_DEPARTMENT_OTHER): Payer: Self-pay

## 2022-10-06 ENCOUNTER — Ambulatory Visit (INDEPENDENT_AMBULATORY_CARE_PROVIDER_SITE_OTHER): Payer: PPO | Admitting: Family

## 2022-10-06 ENCOUNTER — Encounter: Payer: Self-pay | Admitting: Family

## 2022-10-06 VITALS — BP 140/90 | HR 70 | Temp 97.8°F | Resp 16 | Ht 67.0 in | Wt 181.4 lb

## 2022-10-06 DIAGNOSIS — R059 Cough, unspecified: Secondary | ICD-10-CM

## 2022-10-06 DIAGNOSIS — R0989 Other specified symptoms and signs involving the circulatory and respiratory systems: Secondary | ICD-10-CM | POA: Diagnosis not present

## 2022-10-06 DIAGNOSIS — J029 Acute pharyngitis, unspecified: Secondary | ICD-10-CM

## 2022-10-06 LAB — POCT RAPID STREP A (OFFICE): Rapid Strep A Screen: NEGATIVE

## 2022-10-06 LAB — POC COVID19 BINAXNOW: SARS Coronavirus 2 Ag: NEGATIVE

## 2022-10-06 MED ORDER — AZITHROMYCIN 250 MG PO TABS
ORAL_TABLET | ORAL | 0 refills | Status: DC
  Filled 2022-10-06: qty 6, 5d supply, fill #0

## 2022-10-06 NOTE — Progress Notes (Signed)
Provider: Jamieson Lisa FNP-C  Lauree Chandler, NP  Patient Care Team: Lauree Chandler, NP as PCP - General (Geriatric Medicine)  Extended Emergency Contact Information Primary Emergency Contact: [facility] Address: [address]          [place], [place] [address] Johnnette Litter of [place] Phone: [phone] Relation: Spouse Secondary Emergency Contact: Cherne,Rushdan Address: [place], [place] [address] Johnnette Litter of [place] Phone: [phone] Mobile Phone: [phone] Relation: Son  Code Status: Full code Goals of care: Advanced Directive information    10/06/2022    1:40 PM  Advanced Directives  Does Patient Have a Medical Advance Directive? No  Does patient want to make changes to medical advance directive? No - Patient declined     Chief Complaint  Patient presents with   Acute Visit    Patient complains of losing voice, and cold symptoms.     HPI:  Pt is a 72 y.o. female seen today for an acute visit for evaluation of loss of voice and cold symptoms.states grandson had a cold.Her symptoms worsen since yesterday. Has had cough yellow phlegm.Sore throat which is described as " raw".  She denies any fever,chills,,fatigue,body aches,chest tightness,chest pain,palpitation or shortness of breath.   Past Medical History:  Diagnosis Date   Allergy    seasonal allergies   Asthma    uses unhaler   Blood transfusion without reported diagnosis 1990's   Cataract    bilateral sx   Depression    on meds   High blood pressure    on meds   HIV (human immunodeficiency virus infection) ([place])    on meds   Hx of adenomatous polyp of colon 2015   Hyperlipidemia    on meds   Osteoarthritis    Osteopenia    Past Surgical History:  Procedure Laterality Date   Biopsy of Liver     CATARACT EXTRACTION, BILATERAL     CHOLECYSTECTOMY     COLONOSCOPY  2015   MAC-Dr.Liakos-prep(exc)-TA x 1   CYST REMOVAL HAND Left    CYSTECTOMY      off of back   LAPAROSCOPIC SALPINGOOPHERECTOMY Right    POLYPECTOMY  2015   TA    No Known Allergies  Outpatient Encounter Medications as of 10/06/2022  Medication Sig   albuterol (PROVENTIL) (2.5 MG/3ML) 0.083% nebulizer solution Take 3 mLs (2.5 mg total) by nebulization every 4 (four) hours as needed for wheezing or shortness of breath.   albuterol (VENTOLIN HFA) 108 (90 Base) MCG/ACT inhaler INHALE 2 PUFFS BY MOUTH INTO THE LUNGS EVERY 4 HOURS AS NEEDED   ascorbic acid (VITAMIN C) 500 MG tablet Take by mouth.   bictegravir-emtricitabine-tenofovir AF (BIKTARVY) 50-200-25 MG TABS tablet TAKE 1 TABLET BY MOUTH DAILY.   Biotin w/ Vitamins C & E (HAIR/SKIN/NAILS PO) Take by mouth.   Blood Pressure Monitoring (BLOOD PRESSURE CUFF) MISC 1 Device by Does not apply route daily. DX: I10   Calcium Carbonate (CALCIUM 600 PO) Take 1 tablet by mouth daily.    cetirizine (ZYRTEC) 10 MG tablet TAKE 1 TABLET (10 MG TOTAL) BY MOUTH DAILY.   Cholecalciferol (VITAMIN D) 125 MCG (5000 UT) CAPS Take by mouth.   Dupilumab (DUPIXENT) 300 MG/2ML SOPN Inject 300 mg into the skin every 14 (fourteen) days.   ELDERBERRY PO Take by mouth.   enalapril (VASOTEC) 20 MG tablet Take 1 tablet (20 mg total) by mouth daily.   fluticasone-salmeterol (  ADVAIR HFA) 115-21 MCG/ACT inhaler Inhale 1 puff into the lungs 2 (two) times daily.   hydrochlorothiazide (HYDRODIURIL) 25 MG tablet Take 1 tablet (25 mg total) by mouth daily.   metoprolol tartrate (LOPRESSOR) 100 MG tablet Take 1 tablet (100 mg total) by mouth daily.   montelukast (SINGULAIR) 10 MG tablet TAKE 1 TABLET (10 MG TOTAL) BY MOUTH AT BEDTIME.   Multiple Vitamins-Minerals (MULTIVITAMIN GUMMIES ADULT PO) Take 2 tablets by mouth daily. Gummy   Multiple Vitamins-Minerals (ZINC PO) Take by mouth.   pravastatin (PRAVACHOL) 20 MG tablet TAKE 1 TABLET BY MOUTH ONCE DAILY   RSV vaccine recomb adjuvanted (AREXVY) 120 MCG/0.5ML injection Inject into the muscle.    sertraline (ZOLOFT) 25 MG tablet Take 1 tablet (25 mg total) by mouth daily.   Spacer/Aero-Holding Chambers DEVI Use with advair HFA   SPIKEVAX syringe Inject 0.5 mLs into the muscle once.   traZODone (DESYREL) 50 MG tablet Take 1/2-1 tablet (25-50 mg total) by mouth at bedtime as needed for sleep.   vitamin B-12 (CYANOCOBALAMIN) 500 MCG tablet Take 500 mcg by mouth daily.   [DISCONTINUED] linaclotide (LINZESS) 145 MCG CAPS capsule Take 1 capsule (145 mcg total) by mouth daily before breakfast.   [DISCONTINUED] polyethylene glycol (MIRALAX / GLYCOLAX) 17 g packet Take 17 g by mouth daily. (Patient not taking: Reported on 09/21/2022)   [DISCONTINUED] terbinafine (LAMISIL) 250 MG tablet Take 1 tablet (250 mg total) by mouth daily. (Patient not taking: Reported on 09/21/2022)   Facility-Administered Encounter Medications as of 10/06/2022  Medication   0.9 %  sodium chloride infusion    Review of Systems  Constitutional:  Negative for appetite change, chills, fatigue, fever and unexpected weight change.  HENT:  Positive for rhinorrhea, sore throat and voice change. Negative for congestion, dental problem, ear discharge, ear pain, hearing loss, nosebleeds, postnasal drip, sinus pressure, sinus pain, sneezing, tinnitus and trouble swallowing.   Eyes:  Negative for pain, discharge, redness, itching and visual disturbance.  Respiratory:  Positive for cough. Negative for chest tightness, shortness of breath and wheezing.   Cardiovascular:  Negative for chest pain, palpitations and leg swelling.  Gastrointestinal:  Negative for abdominal distention, abdominal pain, blood in stool, constipation, diarrhea, nausea and vomiting.  Skin:  Negative for color change, pallor and rash.  Neurological:  Negative for dizziness, speech difficulty, light-headedness and headaches.    Immunization History  Administered Date(s) Administered   Fluad Quad(high Dose 65+) 05/16/2019, 05/31/2020, 06/06/2021, 06/16/2022    Influenza,inj,Quad PF,6+ Mos 06/17/2017, 06/09/2018   Influenza-Unspecified 06/11/2016   Moderna Covid-19 Vaccine Bivalent Booster 48yr & up 07/12/2021   Moderna Sars-Covid-2 Vaccination 07/06/2020   PFIZER(Purple Top)SARS-COV-2 Vaccination 10/20/2019, 11/14/2019   Pneumococcal Conjugate-13 05/20/2018   Pneumococcal Polysaccharide-23 02/15/2017   Respiratory Syncytial Virus Vaccine,Recomb Aduvanted(Arexvy) 09/21/2022   Tdap 09/21/2017   Zoster Recombinat (Shingrix) 07/19/2018, 02/13/2019   Pertinent  Health Maintenance Due  Topic Date Due   MAMMOGRAM  07/01/2024   COLONOSCOPY (Pts 45-41yrInsurance coverage will need to be confirmed)  12/17/2027   INFLUENZA VACCINE  Completed   DEXA SCAN  Completed      03/16/2022    8:37 AM 08/13/2022   10:11 AM 08/17/2022    1:15 PM 09/21/2022    8:45 AM 10/06/2022    1:40 PM  Fall Risk  Falls in the past year? 1 0 0 0 0  Was there an injury with Fall? 0 0   0  Fall Risk Category Calculator 1 0  0  Fall Risk Category (Retired) Low Low     (RETIRED) Patient Fall Risk Level Low fall risk Low fall risk  Low fall risk   Patient at Risk for Falls Due to History of fall(s) No Fall Risks  No Fall Risks No Fall Risks  Fall risk Follow up _0    Functional Status Survey:    Vitals:   10/06/22 1339  BP: (!) 140/90  Pulse: 70  Resp: 16  Temp: 97.8 F (36.6 C)  SpO2: 98%  Weight: 181 lb 6.4 oz (82.3 kg)  Height: _1  (1.702 m)   Body mass index is 28.41 kg/m. Physical Exam Vitals reviewed.  Constitutional:      General: She is not in acute distress.    Appearance: Normal appearance. She is overweight. She is not ill-appearing or diaphoretic.  HENT:     Head: Normocephalic.     Right Ear: Tympanic membrane, ear canal and external ear normal. There is no impacted cerumen.     Left Ear: Tympanic membrane, ear  canal and external ear normal. There is no impacted cerumen.     Nose: Congestion and rhinorrhea present.     Mouth/Throat:     Mouth: Mucous membranes are moist.     Pharynx: Oropharynx is clear. Posterior oropharyngeal erythema present. No oropharyngeal exudate.     Comments: hoarse Eyes:     General: No scleral icterus.       Right eye: No discharge.        Left eye: No discharge.     Extraocular Movements: Extraocular movements intact.     Conjunctiva/sclera: Conjunctivae normal.     Pupils: Pupils are equal, round, and reactive to light.  Neck:     Vascular: No carotid bruit.  Cardiovascular:     Rate and Rhythm: Normal rate and regular rhythm.     Pulses: Normal pulses.     Heart sounds: Normal heart sounds. No murmur heard.    No friction rub. No gallop.  Pulmonary:     Effort: Pulmonary effort is normal. No respiratory distress.     Breath sounds: Normal breath sounds. No wheezing, rhonchi or rales.  Chest:     Chest wall: No tenderness.  Abdominal:     General: Bowel sounds are normal. There is no distension.     Palpations: Abdomen is soft. There is no mass.     Tenderness: There is no abdominal tenderness. There is no right CVA tenderness, left CVA tenderness, guarding or rebound.  Musculoskeletal:        General: No swelling or tenderness. Normal range of motion.     Cervical back: Normal range of motion. No rigidity or tenderness.     Right lower leg: No edema.     Left lower leg: No edema.  Lymphadenopathy:     Cervical: No cervical adenopathy.  Skin:    General: Skin is warm and dry.     Coloration: Skin is not pale.     Findings: No bruising, erythema or rash.  Neurological:     Mental Status: She is alert and oriented to person, place, and time.     Motor: No weakness.     Gait: Gait normal.  Psychiatric:        Mood and Affect: Mood normal.        Speech: Speech normal.        Behavior: Behavior normal.  Labs reviewed: Recent Labs     01/19/22 0950 03/16/22 0916 09/21/22 0917  NA 139 140 143  K 3.8 4.2 3.8  CL 101 101 102  CO2 32 34* 32  GLUCOSE 87 104* 86  BUN _0 CREATININE 0.97 1.04* 0.97  CALCIUM 9.6 9.8 9.8   Recent Labs    01/19/22 0950 03/16/22 0916 09/21/22 0917  AST _1 ALT _2 BILITOT 0.8 0.8 0.5  PROT 6.5 6.6 6.7   Recent Labs    03/16/22 0916 09/21/22 0912 09/21/22 0917  WBC 7.3 8.6 8.6  NEUTROABS 2,679 3.2 3,139  HGB 14.8 13.6 13.5  HCT 43.7 41.1 39.0  MCV 93.4 94 89.7  PLT 239 285 266   Lab Results  Component Value Date   TSH 1.43 08/16/2017   No results found for: "HGBA1C" Lab Results  Component Value Date   CHOL 159 09/21/2022   HDL 47 (L) 09/21/2022   LDLCALC 90 09/21/2022   TRIG 123 09/21/2022   CHOLHDL 3.4 09/21/2022    Significant Diagnostic Results in last 30 days:  No results found.  Assessment/Plan 1. Cough, unspecified type Afebrile  Bilateral lung clear to auscultation - OTC cough syrup as needed  - POC Rapid Strep A results negative  - POC COVID-19 results negative   2. Chest congestion Continue with OTC cough syrup as needed  - POC Rapid Strep A results negative  - POC COVID-19 results negative   3. Sore throat - Erythema without any exudate noted on posterior pharynx  - Advised to gurgle throat with warm water and salt at least once daily  - take OTC tylenol as needed for pain  - Encouraged to increase fluid intake/warm tea or soup  - Notify provider if symptoms worsen or fail to improve - start on Z-pak  - POC Rapid Strep A results negative  - POC COVID-19 results negative   Family/ staff Communication: Reviewed plan of care with patient verbalized understanding   Labs/tests ordered:  - POC Rapid Strep A results negative  - POC COVID-19 results negative   Next Appointment:Return if symptoms worsen or fail to improve.    Sandrea Hughs, NP

## 2022-10-06 NOTE — Patient Instructions (Signed)
-  Advised to gurgle throat with warm water and salt at least once daily   - Take OTC tylenol as needed for pain   - Encouraged to increase fluid intake/warm tea or soup   - Notify provider if symptoms worsen or fail to improve

## 2022-10-08 ENCOUNTER — Other Ambulatory Visit (HOSPITAL_BASED_OUTPATIENT_CLINIC_OR_DEPARTMENT_OTHER): Payer: Self-pay

## 2022-10-13 ENCOUNTER — Other Ambulatory Visit (HOSPITAL_COMMUNITY): Payer: Self-pay

## 2022-10-13 ENCOUNTER — Other Ambulatory Visit (HOSPITAL_BASED_OUTPATIENT_CLINIC_OR_DEPARTMENT_OTHER): Payer: Self-pay

## 2022-10-14 NOTE — Telephone Encounter (Signed)
Emailed [facility] for update on patient's Dupixent PAP renewal  Knox Saliva, PharmD, MPH, BCPS, CPP Clinical Pharmacist (Rheumatology and Pulmonology)

## 2022-10-15 ENCOUNTER — Ambulatory Visit (INDEPENDENT_AMBULATORY_CARE_PROVIDER_SITE_OTHER): Payer: PPO | Admitting: Family

## 2022-10-15 ENCOUNTER — Ambulatory Visit: Payer: PPO | Admitting: Family

## 2022-10-15 ENCOUNTER — Encounter: Payer: Self-pay | Admitting: Family

## 2022-10-15 VITALS — BP 130/88 | HR 68 | Temp 97.3°F | Resp 16 | Ht 67.0 in | Wt 178.4 lb

## 2022-10-15 DIAGNOSIS — J4550 Severe persistent asthma, uncomplicated: Secondary | ICD-10-CM

## 2022-10-15 DIAGNOSIS — H6121 Impacted cerumen, right ear: Secondary | ICD-10-CM

## 2022-10-15 DIAGNOSIS — I10 Essential (primary) hypertension: Secondary | ICD-10-CM | POA: Diagnosis not present

## 2022-10-15 DIAGNOSIS — E782 Mixed hyperlipidemia: Secondary | ICD-10-CM

## 2022-10-15 DIAGNOSIS — F321 Major depressive disorder, single episode, moderate: Secondary | ICD-10-CM | POA: Diagnosis not present

## 2022-10-15 NOTE — Progress Notes (Signed)
Provider: Marlowe Sax FNP-C   Jaydrien Wassenaar, Nelda Bucks, NP  Patient Care Team: Davan Hark, Nelda Bucks, NP as PCP - General (Family Medicine)  Extended Emergency Contact Information Primary Emergency Contact: [facility] Address: [address]          [place], [place] [address] Johnnette Litter of [place] Phone: [phone] Relation: Spouse Secondary Emergency Contact: Igoe,Rushdan Address: [place],  [address] Johnnette Litter of [place] Phone: [phone] Mobile Phone: [phone] Relation: Son  Code Status:  Full Code  Goals of care: Advanced Directive information    10/15/2022    9:45 AM  Advanced Directives  Does Patient Have a Medical Advance Directive? No  Does patient want to make changes to medical advance directive? No - Patient declined     Chief Complaint  Patient presents with   Medical Management of Chronic Issues    Routine Visit   Immunizations    Discuss the need for Covid Booster    HPI:  Pt is a 72 y.o. female seen today for medical management of chronic diseases.  Labs drawn at the ID reviewed and discussed during visit.  Recent cough has improved except voice has not fully come back. Insomnia - trazodone 25 mg tablet effective.trying to wean herself off.melatonin did not work for her.   Depression - not taking sertraline daily.No worsening symptoms.  Hyperlipidemia - TRG and LDL has improved compared to previous level. Walking on the Treadmill for 30 minutes three times per week at home.  Asthma - coughs every now and then.Has been using inhaler.rinses the mouth after use but throat gets irritated. She denies any fever,chills,fatigue,body aches,runny nose,chest tightness,chest pain,palpitation or shortness of breath.   Past Medical History:  Diagnosis Date   Allergy    seasonal allergies   Asthma    uses unhaler   Blood transfusion without reported diagnosis 1990's   Cataract    bilateral sx   Depression    on  meds   High blood pressure    on meds   HIV (human immunodeficiency virus infection) ([place])    on meds   Hx of adenomatous polyp of colon 2015   Hyperlipidemia    on meds   Osteoarthritis    Osteopenia    Past Surgical History:  Procedure Laterality Date   Biopsy of Liver     CATARACT EXTRACTION, BILATERAL     CHOLECYSTECTOMY     COLONOSCOPY  2015   MAC-Dr.Liakos-prep(exc)-TA x 1   CYST REMOVAL HAND Left    CYSTECTOMY     off of back   LAPAROSCOPIC SALPINGOOPHERECTOMY Right    POLYPECTOMY  2015   TA    No Known Allergies  Allergies as of 10/15/2022   No Known Allergies      Medication List        Accurate as of October 15, 2022 10:08 AM. If you have any questions, ask your nurse or doctor.          Advair HFA 115-21 MCG/ACT inhaler Generic drug: fluticasone-salmeterol Inhale 1 puff into the lungs 2 (two) times daily.   albuterol (2.5 MG/3ML) 0.083% nebulizer solution Commonly known as: PROVENTIL Take 3 mLs (2.5 mg total) by nebulization every 4 (four) hours as needed for wheezing or shortness of breath.   albuterol 108 (90 Base) MCG/ACT inhaler Commonly known as: VENTOLIN HFA INHALE 2 PUFFS BY MOUTH INTO THE LUNGS EVERY 4 HOURS AS NEEDED   Allergy (  Cetirizine) 10 MG tablet Generic drug: cetirizine TAKE 1 TABLET (10 MG TOTAL) BY MOUTH DAILY.   Arexvy 120 MCG/0.5ML injection Generic drug: RSV vaccine recomb adjuvanted Inject into the muscle.   ascorbic acid 500 MG tablet Commonly known as: VITAMIN C Take by mouth.   azithromycin 250 MG tablet Commonly known as: ZITHROMAX Take 2 tablets (500 mg) by mouth on day 1, then take 1 tablet (250 mg) by mouth daily for 4 days.   Biktarvy 50-200-25 MG Tabs tablet Generic drug: bictegravir-emtricitabine-tenofovir AF TAKE 1 TABLET BY MOUTH DAILY.   Blood Pressure Cuff Misc 1 Device by Does not apply route daily. DX: I10   CALCIUM 600 PO Take 1 tablet by mouth daily.   [employer] Use with  advair HFA   cyanocobalamin 500 MCG tablet Commonly known as: VITAMIN B12 Take 500 mcg by mouth daily.   Dupixent 300 MG/2ML Sopn Generic drug: Dupilumab Inject 300 mg into the skin every 14 (fourteen) days.   ELDERBERRY PO Take by mouth.   enalapril 20 MG tablet Commonly known as: Vasotec Take 1 tablet (20 mg total) by mouth daily.   HAIR/SKIN/NAILS PO Take by mouth.   hydrochlorothiazide 25 MG tablet Commonly known as: HYDRODIURIL Take 1 tablet (25 mg total) by mouth daily.   metoprolol tartrate 100 MG tablet Commonly known as: LOPRESSOR Take 1 tablet (100 mg total) by mouth daily.   montelukast 10 MG tablet Commonly known as: SINGULAIR TAKE 1 TABLET (10 MG TOTAL) BY MOUTH AT BEDTIME.   MULTIVITAMIN GUMMIES ADULT PO Take 2 tablets by mouth daily. Gummy   pravastatin 20 MG tablet Commonly known as: PRAVACHOL TAKE 1 TABLET BY MOUTH ONCE DAILY   sertraline 25 MG tablet Commonly known as: ZOLOFT Take 1 tablet (25 mg total) by mouth daily.   Spikevax syringe Generic drug: COVID-19 mRNA vaccine 2023-2024 Inject 0.5 mLs into the muscle once.   traZODone 50 MG tablet Commonly known as: DESYREL Take 1/2-1 tablet (25-50 mg total) by mouth at bedtime as needed for sleep.   Vitamin D 125 MCG (5000 UT) Caps Take by mouth.   ZINC PO Take by mouth.        Review of Systems  Constitutional:  Negative for appetite change, chills, fatigue, fever and unexpected weight change.  HENT:  Negative for congestion, dental problem, ear discharge, ear pain, facial swelling, hearing loss, nosebleeds, postnasal drip, rhinorrhea, sinus pressure, sinus pain, sneezing, sore throat, tinnitus and trouble swallowing.   Eyes:  Negative for pain, discharge, redness, itching and visual disturbance.  Respiratory:  Negative for cough, chest tightness, shortness of breath and wheezing.   Cardiovascular:  Negative for chest pain, palpitations and leg swelling.  Gastrointestinal:  Negative  for abdominal distention, abdominal pain, blood in stool, constipation, diarrhea, nausea and vomiting.  Endocrine: Negative for cold intolerance, heat intolerance, polydipsia, polyphagia and polyuria.  Genitourinary:  Negative for difficulty urinating, dysuria, flank pain, frequency and urgency.  Musculoskeletal:  Negative for arthralgias, back pain, gait problem, joint swelling, myalgias, neck pain and neck stiffness.  Skin:  Negative for color change, pallor, rash and wound.  Neurological:  Negative for dizziness, syncope, speech difficulty, weakness, light-headedness, numbness and headaches.  Hematological:  Does not bruise/bleed easily.  Psychiatric/Behavioral:  Negative for agitation, behavioral problems, confusion, hallucinations, self-injury, sleep disturbance and suicidal ideas. The patient is not nervous/anxious.     Immunization History  Administered Date(s) Administered   Fluad Quad(high Dose 65+) 05/16/2019, 05/31/2020, 06/06/2021, 06/16/2022  Influenza,inj,Quad PF,6+ Mos 06/17/2017, 06/09/2018   Influenza-Unspecified 06/11/2016   Moderna Covid-19 Vaccine Bivalent Booster 14yr & up 07/12/2021   Moderna Sars-Covid-2 Vaccination 07/06/2020   PFIZER(Purple Top)SARS-COV-2 Vaccination 10/20/2019, 11/14/2019   Pneumococcal Conjugate-13 05/20/2018   Pneumococcal Polysaccharide-23 02/15/2017   Respiratory Syncytial Virus Vaccine,Recomb Aduvanted(Arexvy) 09/21/2022   Tdap 09/21/2017   Zoster Recombinat (Shingrix) 07/19/2018, 02/13/2019   Pertinent  Health Maintenance Due  Topic Date Due   MAMMOGRAM  07/01/2024   COLONOSCOPY (Pts 45-428yrInsurance coverage will need to be confirmed)  12/17/2027   INFLUENZA VACCINE  Completed   DEXA SCAN  Completed      08/13/2022   10:11 AM 08/17/2022    1:15 PM 09/21/2022    8:45 AM 10/06/2022    1:40 PM 10/15/2022    9:45 AM  Fall Risk  Falls in the past year? 0 0 0 0 0  Was there an injury with Fall? 0   0 0  Fall Risk Category Calculator 0    0 0  Fall Risk Category (Retired) Low      (RETIRED) Patient Fall Risk Level Low fall risk  Low fall risk    Patient at Risk for Falls Due to No Fall Risks  No Fall Risks No Fall Risks No Fall Risks  Fall risk Follow up Falls evaluation completed Falls evaluation completed Falls evaluation completed Falls evaluation completed Falls evaluation completed   Functional Status Survey:    Vitals:   10/15/22 0940  BP: 130/88  Pulse: 68  Resp: 16  Temp: (!) 97.3 F (36.3 C)  SpO2: 97%  Weight: 178 lb 6.4 oz (80.9 kg)  Height: 5' 7"  (1.702 m)   Body mass index is 27.94 kg/m. Physical Exam Vitals reviewed.  Constitutional:      General: She is not in acute distress.    Appearance: Normal appearance. She is overweight. She is not ill-appearing or diaphoretic.  HENT:     Head: Normocephalic.     Right Ear: There is impacted cerumen.     Left Ear: Tympanic membrane, ear canal and external ear normal. There is no impacted cerumen.     Nose: Nose normal. No congestion or rhinorrhea.     Mouth/Throat:     Mouth: Mucous membranes are moist.     Pharynx: Oropharynx is clear. No oropharyngeal exudate or posterior oropharyngeal erythema.  Eyes:     General: No scleral icterus.       Right eye: No discharge.        Left eye: No discharge.     Extraocular Movements: Extraocular movements intact.     Conjunctiva/sclera: Conjunctivae normal.     Pupils: Pupils are equal, round, and reactive to light.  Neck:     Vascular: No carotid bruit.  Cardiovascular:     Rate and Rhythm: Normal rate and regular rhythm.     Pulses: Normal pulses.     Heart sounds: Normal heart sounds. No murmur heard.    No friction rub. No gallop.  Pulmonary:     Effort: Pulmonary effort is normal. No respiratory distress.     Breath sounds: Normal breath sounds. No wheezing, rhonchi or rales.  Chest:     Chest wall: No tenderness.  Abdominal:     General: Bowel sounds are normal. There is no distension.      Palpations: Abdomen is soft. There is no mass.     Tenderness: There is no abdominal tenderness. There is no right CVA tenderness, left CVA  tenderness, guarding or rebound.  Musculoskeletal:        General: No swelling or tenderness. Normal range of motion.     Cervical back: Normal range of motion. No rigidity or tenderness.     Right lower leg: No edema.     Left lower leg: No edema.  Lymphadenopathy:     Cervical: No cervical adenopathy.  Skin:    General: Skin is warm and dry.     Coloration: Skin is not pale.     Findings: No bruising, erythema, lesion or rash.  Neurological:     Mental Status: She is alert and oriented to person, place, and time.     Cranial Nerves: No cranial nerve deficit.     Sensory: No sensory deficit.     Motor: No weakness.     Coordination: Coordination normal.     Gait: Gait normal.  Psychiatric:        Mood and Affect: Mood normal.        Speech: Speech normal.        Behavior: Behavior normal.        Thought Content: Thought content normal.        Judgment: Judgment normal.    Labs reviewed: Recent Labs    01/19/22 0950 03/16/22 0916 09/21/22 0917  NA 139 140 143  K 3.8 4.2 3.8  CL 101 101 102  CO2 32 34* 32  GLUCOSE 87 104* 86  BUN 10 14 14   CREATININE 0.97 1.04* 0.97  CALCIUM 9.6 9.8 9.8   Recent Labs    01/19/22 0950 03/16/22 0916 09/21/22 0917  AST 20 20 18   ALT 18 17 16   BILITOT 0.8 0.8 0.5  PROT 6.5 6.6 6.7   Recent Labs    03/16/22 0916 09/21/22 0912 09/21/22 0917  WBC 7.3 8.6 8.6  NEUTROABS 2,679 3.2 3,139  HGB 14.8 13.6 13.5  HCT 43.7 41.1 39.0  MCV 93.4 94 89.7  PLT 239 285 266   Lab Results  Component Value Date   TSH 1.43 08/16/2017   No results found for: "HGBA1C" Lab Results  Component Value Date   CHOL 159 09/21/2022   HDL 47 (L) 09/21/2022   LDLCALC 90 09/21/2022   TRIG 123 09/21/2022   CHOLHDL 3.4 09/21/2022    Significant Diagnostic Results in last 30 days:  No results  found.  Assessment/Plan 1. Essential hypertension B/p controlled  Continue on metoprolol,HCZT  and Enalpril  2. Mixed hyperlipidemia LDL at goal  Continue on dietary modification and exercise  - continue on Pravastatin   3. Depression, major, single episode, moderate (HCC) Mood stable  Continue on sertraline  4. Severe persistent asthma, unspecified whether complicated Stable no wheezes on exam  - continue on current inhalers   5. Impacted cerumen of right ear TM not visualized  - Ambulatory referral to ENT  Family/ staff Communication: Reviewed plan of care with patient verbalized understanding  Labs/tests ordered: None   Next Appointment : Return in about 6 months (around 04/15/2023) for medical mangement of chronic issues.Sandrea Hughs, NP

## 2022-10-16 ENCOUNTER — Other Ambulatory Visit (HOSPITAL_COMMUNITY): Payer: Self-pay

## 2022-10-16 NOTE — Telephone Encounter (Signed)
Per [employer] [facility], patient has been reapproved for [employer] patient assistance from 09/21/2022 through 09/14/2023. Called [employer] and rep will refax approval letter to clinic.  Phone: [phone]  Knox Saliva, PharmD, MPH, BCPS, CPP Clinical Pharmacist (Rheumatology and Pulmonology)

## 2022-10-21 ENCOUNTER — Other Ambulatory Visit (HOSPITAL_COMMUNITY): Payer: Self-pay

## 2022-10-22 ENCOUNTER — Other Ambulatory Visit (HOSPITAL_BASED_OUTPATIENT_CLINIC_OR_DEPARTMENT_OTHER): Payer: Self-pay

## 2022-10-26 ENCOUNTER — Ambulatory Visit: Payer: PPO | Admitting: Nurse Practitioner

## 2022-10-27 ENCOUNTER — Other Ambulatory Visit (HOSPITAL_COMMUNITY): Payer: Self-pay

## 2022-10-28 ENCOUNTER — Other Ambulatory Visit (HOSPITAL_COMMUNITY): Payer: Self-pay

## 2022-11-05 ENCOUNTER — Other Ambulatory Visit (HOSPITAL_BASED_OUTPATIENT_CLINIC_OR_DEPARTMENT_OTHER): Payer: Self-pay

## 2022-11-05 ENCOUNTER — Other Ambulatory Visit (HOSPITAL_COMMUNITY): Payer: Self-pay

## 2022-11-10 ENCOUNTER — Other Ambulatory Visit (HOSPITAL_BASED_OUTPATIENT_CLINIC_OR_DEPARTMENT_OTHER): Payer: Self-pay

## 2022-11-16 ENCOUNTER — Other Ambulatory Visit: Payer: Self-pay

## 2022-11-17 ENCOUNTER — Other Ambulatory Visit (HOSPITAL_COMMUNITY): Payer: Self-pay

## 2022-12-03 ENCOUNTER — Other Ambulatory Visit (HOSPITAL_COMMUNITY): Payer: Self-pay

## 2022-12-03 ENCOUNTER — Other Ambulatory Visit (HOSPITAL_BASED_OUTPATIENT_CLINIC_OR_DEPARTMENT_OTHER): Payer: Self-pay

## 2022-12-08 ENCOUNTER — Other Ambulatory Visit (HOSPITAL_BASED_OUTPATIENT_CLINIC_OR_DEPARTMENT_OTHER): Payer: Self-pay

## 2022-12-15 ENCOUNTER — Other Ambulatory Visit: Payer: Self-pay

## 2022-12-15 ENCOUNTER — Other Ambulatory Visit (HOSPITAL_COMMUNITY): Payer: Self-pay

## 2022-12-18 ENCOUNTER — Other Ambulatory Visit (HOSPITAL_BASED_OUTPATIENT_CLINIC_OR_DEPARTMENT_OTHER): Payer: Self-pay

## 2022-12-18 ENCOUNTER — Other Ambulatory Visit: Payer: Self-pay | Admitting: Nurse Practitioner

## 2022-12-18 DIAGNOSIS — F5101 Primary insomnia: Secondary | ICD-10-CM

## 2022-12-18 MED ORDER — TRAZODONE HCL 50 MG PO TABS
25.0000 mg | ORAL_TABLET | Freq: Every evening | ORAL | 3 refills | Status: AC | PRN
  Filled 2022-12-18: qty 30, 30d supply, fill #0

## 2022-12-18 NOTE — Telephone Encounter (Signed)
Pharmacy requested refill.  Pended Rx and sent to [facility] for approval due to HIGH ALERT Warning.  

## 2022-12-25 ENCOUNTER — Other Ambulatory Visit (HOSPITAL_BASED_OUTPATIENT_CLINIC_OR_DEPARTMENT_OTHER): Payer: Self-pay

## 2022-12-28 ENCOUNTER — Telehealth: Payer: Self-pay

## 2022-12-28 NOTE — Telephone Encounter (Signed)
Recommend office visit for evaluation of cough prior to prescribing antibiotics.

## 2022-12-28 NOTE — Telephone Encounter (Signed)
Patient called stating that she has had cough for about three weeks and would like an antibiotic called in. She says that she has nasal congestion. She is coughing up yellow mucous and also yellow nasal drainage. Patient states that she does not want to come in to see a different provider.  Message routed to Richarda Blade, NP

## 2022-12-29 NOTE — Telephone Encounter (Signed)
Patient has been scheduled for appointment 12/30/22

## 2022-12-30 ENCOUNTER — Other Ambulatory Visit (HOSPITAL_BASED_OUTPATIENT_CLINIC_OR_DEPARTMENT_OTHER): Payer: Self-pay

## 2022-12-30 ENCOUNTER — Ambulatory Visit (INDEPENDENT_AMBULATORY_CARE_PROVIDER_SITE_OTHER): Payer: 59 | Admitting: Family

## 2022-12-30 ENCOUNTER — Encounter: Payer: Self-pay | Admitting: Family

## 2022-12-30 VITALS — BP 122/84 | HR 61 | Temp 98.3°F | Resp 16 | Ht 67.0 in | Wt 178.2 lb

## 2022-12-30 DIAGNOSIS — R059 Cough, unspecified: Secondary | ICD-10-CM

## 2022-12-30 DIAGNOSIS — H6123 Impacted cerumen, bilateral: Secondary | ICD-10-CM

## 2022-12-30 DIAGNOSIS — R0981 Nasal congestion: Secondary | ICD-10-CM | POA: Diagnosis not present

## 2022-12-30 MED ORDER — FLUTICASONE PROPIONATE 50 MCG/ACT NA SUSP
2.0000 | Freq: Every day | NASAL | 6 refills | Status: DC
  Filled 2022-12-30: qty 16, 30d supply, fill #0
  Filled 2023-01-20 – 2023-04-27 (×2): qty 16, 30d supply, fill #1

## 2022-12-30 MED ORDER — DOXYCYCLINE HYCLATE 100 MG PO TABS
100.0000 mg | ORAL_TABLET | Freq: Two times a day (BID) | ORAL | 0 refills | Status: AC
  Filled 2022-12-30: qty 14, 7d supply, fill #0

## 2022-12-30 NOTE — Progress Notes (Signed)
Provider: Richarda Blade FNP-C  Seymour Pavlak, Donalee Citrin, NP  Patient Care Team: Catheline Hixon, Donalee Citrin, NP as PCP - General (Family Medicine)  Extended Emergency Contact Information Primary Emergency Contact: [facility] Address: [address]          [place], [place] [address] Darden [place] of [place] Home Phone: [phone] Relation: Spouse Secondary Emergency Contact: Norden,Rushdan Address: [address]          [place], [place] [address] D[place] of [place] Home Phone: [phone] Mobile Phone: [phone] Relation: Son  Code Status:  Full Code Goals of care: Advanced Directive information    10/15/2022    9:45 AM  Advanced Directives  Does Patient Have a Medical Advance Directive? No  Does patient want to make changes to medical advance directive? No - Patient declined     Chief Complaint  Patient presents with   Cough    Cough and nasal congestion x 3 weeks. It was yellow 2 days ago and now its clear. Still has a cough. Has chapped lips for the first time ever in her life.     HPI:  Pt is a 72 y.o. female seen today for an acute visit for evaluation of cough and nasal congestion x 3 weeks.states symptoms started as as allergies.cough is nonproductive.had yellow mucus before.could hear her self wheezing.cough was worst at night. Has been taking dsylm  She took Xyzal for allergies but dried her mouth and lips.had chapped lips with cracked edges.Felt like had sand.  She denies any fever,chills,fatigue,body aches,chest tightness,chest pain,palpitation or shortness of breath.    Past Medical History:  Diagnosis Date   Allergy    seasonal allergies   Asthma    uses unhaler   Blood transfusion without reported diagnosis 1990's   Cataract    bilateral sx   Depression    on meds   High blood pressure    on meds   HIV (human immunodeficiency virus infection)    on meds   Hx of adenomatous polyp of colon 2015   Hyperlipidemia    on meds   Osteoarthritis     Osteopenia    Past Surgical History:  Procedure Laterality Date   Biopsy of Liver     CATARACT EXTRACTION, BILATERAL     CHOLECYSTECTOMY     COLONOSCOPY  2015   [facility]-Dr.Liakos-prep(exc)-TA x 1   CYST REMOVAL HAND Left    CYSTECTOMY     off of back   LAPAROSCOPIC SALPINGOOPHERECTOMY Right    POLYPECTOMY  2015   TA    No Known Allergies  Outpatient Encounter Medications as of 12/30/2022  Medication Sig   albuterol (PROVENTIL) (2.5 MG/3ML) 0.083% nebulizer solution Take 3 mLs (2.5 mg total) by nebulization every 4 (four) hours as needed for wheezing or shortness of breath.   albuterol (VENTOLIN HFA) 108 (90 Base) MCG/ACT inhaler INHALE 2 PUFFS BY MOUTH INTO THE LUNGS EVERY 4 HOURS AS NEEDED   ascorbic acid (VITAMIN C) 500 MG tablet Take by mouth.   bictegravir-emtricitabine-tenofovir AF (BIKTARVY) 50-200-25 MG TABS tablet TAKE 1 TABLET BY MOUTH DAILY.   Biotin w/ Vitamins C & E (HAIR/SKIN/NAILS PO) Take by mouth.   Blood Pressure Monitoring (BLOOD PRESSURE CUFF) MISC 1 Device by Does not apply route daily. DX: I10   Calcium Carbonate (CALCIUM 600 PO) Take 1 tablet by mouth daily.    cetirizine (ZYRTEC) 10 MG tablet TAKE 1 TABLET (10 MG TOTAL) BY MOUTH DAILY.   Cholecalciferol (VITAMIN D) 125 MCG (5000 UT)  CAPS Take by mouth.   Dupilumab (DUPIXENT) 300 MG/2ML SOPN Inject 300 mg into the skin every 14 (fourteen) days.   ELDERBERRY PO Take by mouth.   enalapril (VASOTEC) 20 MG tablet Take 1 tablet (20 mg total) by mouth daily.   fluticasone-salmeterol (ADVAIR HFA) 115-21 MCG/ACT inhaler Inhale 1 puff into the lungs 2 (two) times daily.   hydrochlorothiazide (HYDRODIURIL) 25 MG tablet Take 1 tablet (25 mg total) by mouth daily.   metoprolol tartrate (LOPRESSOR) 100 MG tablet Take 1 tablet (100 mg total) by mouth daily.   montelukast (SINGULAIR) 10 MG tablet TAKE 1 TABLET (10 MG TOTAL) BY MOUTH AT BEDTIME.   Multiple Vitamins-Minerals (MULTIVITAMIN GUMMIES ADULT PO) Take 2 tablets by mouth  daily. Gummy   Multiple Vitamins-Minerals (ZINC PO) Take by mouth.   RSV vaccine recomb adjuvanted (AREXVY) 120 MCG/0.5ML injection Inject into the muscle.   sertraline (ZOLOFT) 25 MG tablet Take 1 tablet (25 mg total) by mouth daily.   Spacer/Aero-Holding Chambers DEVI Use with advair HFA   SPIKEVAX syringe Inject 0.5 mLs into the muscle once.   traZODone (DESYREL) 50 MG tablet Take 1/2-1 tablet (25-50 mg total) by mouth at bedtime as needed for sleep.   vitamin B-12 (CYANOCOBALAMIN) 500 MCG tablet Take 500 mcg by mouth daily.   [DISCONTINUED] azithromycin (ZITHROMAX) 250 MG tablet Take 2 tablets (500 mg) by mouth on day 1, then take 1 tablet (250 mg) by mouth daily for 4 days.   pravastatin (PRAVACHOL) 20 MG tablet TAKE 1 TABLET BY MOUTH ONCE DAILY   [DISCONTINUED] linaclotide (LINZESS) 145 MCG CAPS capsule Take 1 capsule (145 mcg total) by mouth daily before breakfast.   Facility-Administered Encounter Medications as of 12/30/2022  Medication   0.9 %  sodium chloride infusion    Review of Systems  Constitutional:  Negative for appetite change, chills, fatigue, fever and unexpected weight change.  HENT:  Positive for congestion, postnasal drip and rhinorrhea. Negative for dental problem, ear discharge, ear pain, facial swelling, hearing loss, nosebleeds, sinus pressure, sinus pain, sneezing, sore throat, tinnitus and trouble swallowing.   Eyes:  Negative for pain, discharge, redness, itching and visual disturbance.  Respiratory:  Positive for cough. Negative for chest tightness, shortness of breath and wheezing.   Cardiovascular:  Negative for chest pain, palpitations and leg swelling.  Gastrointestinal:  Negative for abdominal distention, abdominal pain, blood in stool, constipation, diarrhea, nausea and vomiting.  Endocrine: Negative for cold intolerance, heat intolerance, polydipsia, polyphagia and polyuria.  Genitourinary:  Negative for difficulty urinating, dysuria, flank pain,  frequency and urgency.  Musculoskeletal:  Negative for arthralgias, back pain, gait problem, joint swelling, myalgias, neck pain and neck stiffness.  Skin:  Negative for color change, pallor, rash and wound.  Neurological:  Negative for dizziness, syncope, speech difficulty, weakness, light-headedness, numbness and headaches.  Hematological:  Does not bruise/bleed easily.  Psychiatric/Behavioral:  Negative for agitation, behavioral problems, confusion, hallucinations, self-injury, sleep disturbance and suicidal ideas. The patient is not nervous/anxious.     Immunization History  Administered Date(s) Administered   Fluad Quad(high Dose 65+) 05/16/2019, 05/31/2020, 06/06/2021, 06/16/2022   Influenza,inj,Quad PF,6+ Mos 06/17/2017, 06/09/2018   Influenza-Unspecified 06/11/2016   Moderna Covid-19 Vaccine Bivalent Booster 47yrs & up 07/12/2021   Moderna Sars-Covid-2 Vaccination 07/06/2020   PFIZER(Purple Top)SARS-COV-2 Vaccination 10/20/2019, 11/14/2019   Pneumococcal Conjugate-13 05/20/2018   Pneumococcal Polysaccharide-23 02/15/2017   Respiratory Syncytial Virus Vaccine,Recomb Aduvanted(Arexvy) 09/21/2022   Tdap 09/21/2017   Zoster Recombinat (Shingrix) 07/19/2018, 02/13/2019   Pertinent  Health Maintenance Due  Topic Date Due   INFLUENZA VACCINE  04/15/2023   MAMMOGRAM  07/01/2024   COLONOSCOPY (Pts 45-69yrs Insurance coverage will need to be confirmed)  12/17/2027   DEXA SCAN  Completed      08/13/2022   10:11 AM 08/17/2022    1:15 PM 09/21/2022    8:45 AM 10/06/2022    1:40 PM 10/15/2022    9:45 AM  Fall Risk  Falls in the past year? 0 0 0 0 0  Was there an injury with Fall? 0   0 0  Fall Risk Category Calculator 0   0 0  Fall Risk Category (Retired) Low      (RETIRED) Patient Fall Risk Level Low fall risk  Low fall risk    Patient at Risk for Falls Due to No Fall Risks  No Fall Risks No Fall Risks No Fall Risks  Fall risk Follow up Falls evaluation completed Falls evaluation  completed Falls evaluation completed Falls evaluation completed Falls evaluation completed   Functional Status Survey:    Vitals:   12/30/22 1033  BP: 122/84  Pulse: 61  Resp: 16  Temp: 98.3 F (36.8 C)  TempSrc: Temporal  SpO2: 98%  Weight: 178 lb 3.2 oz (80.8 kg)  Height: 5\' 7"  (1.702 m)   Body mass index is 27.91 kg/m. Physical Exam Vitals reviewed.  Constitutional:      General: She is not in acute distress.    Appearance: Normal appearance. She is normal weight. She is not ill-appearing or diaphoretic.  HENT:     Head: Normocephalic.     Right Ear: There is impacted cerumen.     Left Ear: There is impacted cerumen.     Nose: Congestion and rhinorrhea present.     Mouth/Throat:     Mouth: Mucous membranes are moist.     Pharynx: Oropharynx is clear. No pharyngeal swelling, oropharyngeal exudate or posterior oropharyngeal erythema.  Eyes:     General: No scleral icterus.       Right eye: No discharge.        Left eye: No discharge.     Extraocular Movements: Extraocular movements intact.     Conjunctiva/sclera: Conjunctivae normal.     Pupils: Pupils are equal, round, and reactive to light.  Neck:     Vascular: No carotid bruit.  Cardiovascular:     Rate and Rhythm: Normal rate and regular rhythm.     Pulses: Normal pulses.     Heart sounds: Normal heart sounds. No murmur heard.    No friction rub. No gallop.  Pulmonary:     Effort: Pulmonary effort is normal. No respiratory distress.     Breath sounds: Normal breath sounds. No wheezing, rhonchi or rales.  Chest:     Chest wall: No tenderness.  Abdominal:     General: Bowel sounds are normal. There is no distension.     Palpations: Abdomen is soft. There is no mass.     Tenderness: There is no abdominal tenderness. There is no right CVA tenderness, left CVA tenderness, guarding or rebound.  Musculoskeletal:        General: No swelling or tenderness. Normal range of motion.     Cervical back: Normal range of  motion. No rigidity or tenderness.     Right lower leg: No edema.     Left lower leg: No edema.  Lymphadenopathy:     Cervical: No cervical adenopathy.  Skin:    General: Skin is  warm and dry.     Coloration: Skin is not pale.     Findings: No bruising, erythema, lesion or rash.  Neurological:     Mental Status: She is alert and oriented to person, place, and time.     Cranial Nerves: No cranial nerve deficit.     Sensory: No sensory deficit.     Motor: No weakness.     Coordination: Coordination normal.     Gait: Gait normal.  Psychiatric:        Mood and Affect: Mood normal.        Speech: Speech normal.        Behavior: Behavior normal.     Labs reviewed: Recent Labs    01/19/22 0950 03/16/22 0916 09/21/22 0917  NA 139 140 143  K 3.8 4.2 3.8  CL 101 101 102  CO2 32 34* 32  GLUCOSE 87 104* 86  BUN CREATININE 0.97 1.04* 0.97  CALCIUM 9.6 9.8 9.8   Recent Labs    01/19/22 0950 03/16/22 0916 09/21/22 0917  AST ALT BILITOT 0.8 0.8 0.5  PROT 6.5 6.6 6.7   Recent Labs    03/16/22 0916 09/21/22 0912 09/21/22 0917  WBC 7.3 8.6 8.6  NEUTROABS 2,679 3.2 3,139  HGB 14.8 13.6 13.5  HCT 43.7 41.1 39.0  MCV 93.4 94 89.7  PLT 239 285 266   Lab Results  Component Value Date   TSH 1.43 08/16/2017   No results found for: "HGBA1C" Lab Results  Component Value Date   CHOL 159 09/21/2022   HDL 47 (L) 09/21/2022   LDLCALC 90 09/21/2022   TRIG 123 09/21/2022   CHOLHDL 3.4 09/21/2022    Significant Diagnostic Results in last 30 days:  No results found.  Assessment/Plan 1. Cough, unspecified type Bilateral lungs clear to auscultation though deep cough noted will treat clinically with antibiotics due to cough duration and cough presentation.  - doxycycline (VIBRA-TABS) 100 MG tablet; Take 1 tablet (100 mg total) by mouth 2 (two) times daily for 7 days.  Dispense: 14 tablet; Refill: 0  2. Nasal congestion Has been using Husband's  Flonase will send script.  - fluticasone (FLONASE) 50 MCG/ACT nasal spray; Place 2 sprays into both nostrils daily.  Dispense: 16 g; Refill: 6  3. Bilateral impacted cerumen Bilateral TM not visualized due to cerumen impaction.lavage ineffective in the past will refer to ENT for evaluation - Ambulatory referral to ENT  Family/ staff Communication: Reviewed plan of care with patient verbalized understanding   Labs/tests ordered: None   Next Appointment: Return if symptoms worsen or fail to improve.   Caesar Bookman, NP

## 2023-01-01 ENCOUNTER — Other Ambulatory Visit (HOSPITAL_BASED_OUTPATIENT_CLINIC_OR_DEPARTMENT_OTHER): Payer: Self-pay

## 2023-01-06 ENCOUNTER — Telehealth: Payer: Self-pay | Admitting: Family

## 2023-01-06 NOTE — Telephone Encounter (Signed)
Medical Evaluation for [employer] forms received.will need patient to have a TB screening test QuantiFeron Gold.Please call to schedule for lab appointment.

## 2023-01-07 ENCOUNTER — Other Ambulatory Visit (HOSPITAL_BASED_OUTPATIENT_CLINIC_OR_DEPARTMENT_OTHER): Payer: Self-pay

## 2023-01-07 ENCOUNTER — Other Ambulatory Visit: Payer: Self-pay | Admitting: *Deleted

## 2023-01-07 ENCOUNTER — Other Ambulatory Visit: Payer: PPO

## 2023-01-07 DIAGNOSIS — Z111 Encounter for screening for respiratory tuberculosis: Secondary | ICD-10-CM

## 2023-01-09 LAB — QUANTIFERON-TB GOLD PLUS
Mitogen-NIL: >10 IU/mL
NIL: 0.02 IU/mL
QuantiFERON-TB Gold Plus: NEGATIVE
TB1-NIL: 0 IU/mL
TB2-NIL: 0 IU/mL

## 2023-01-19 ENCOUNTER — Other Ambulatory Visit (HOSPITAL_COMMUNITY): Payer: Self-pay

## 2023-01-20 ENCOUNTER — Other Ambulatory Visit: Payer: Self-pay | Admitting: Nurse Practitioner

## 2023-01-20 ENCOUNTER — Other Ambulatory Visit (HOSPITAL_BASED_OUTPATIENT_CLINIC_OR_DEPARTMENT_OTHER): Payer: Self-pay

## 2023-01-20 MED ORDER — METOPROLOL TARTRATE 100 MG PO TABS
100.0000 mg | ORAL_TABLET | Freq: Every day | ORAL | 1 refills | Status: DC
  Filled 2023-01-20: qty 90, 90d supply, fill #0
  Filled 2023-04-26: qty 90, 90d supply, fill #1

## 2023-01-21 ENCOUNTER — Other Ambulatory Visit: Payer: Self-pay

## 2023-01-22 ENCOUNTER — Other Ambulatory Visit (HOSPITAL_BASED_OUTPATIENT_CLINIC_OR_DEPARTMENT_OTHER): Payer: Self-pay

## 2023-02-01 ENCOUNTER — Other Ambulatory Visit (HOSPITAL_BASED_OUTPATIENT_CLINIC_OR_DEPARTMENT_OTHER): Payer: Self-pay

## 2023-02-09 ENCOUNTER — Encounter: Payer: Self-pay | Admitting: Family

## 2023-02-09 ENCOUNTER — Ambulatory Visit (INDEPENDENT_AMBULATORY_CARE_PROVIDER_SITE_OTHER): Payer: 59 | Admitting: Family

## 2023-02-09 ENCOUNTER — Other Ambulatory Visit (HOSPITAL_COMMUNITY): Payer: Self-pay

## 2023-02-09 ENCOUNTER — Other Ambulatory Visit (HOSPITAL_BASED_OUTPATIENT_CLINIC_OR_DEPARTMENT_OTHER): Payer: Self-pay

## 2023-02-09 VITALS — BP 120/76 | HR 66 | Temp 98.4°F | Ht 67.0 in | Wt 177.0 lb

## 2023-02-09 DIAGNOSIS — R220 Localized swelling, mass and lump, head: Secondary | ICD-10-CM | POA: Diagnosis not present

## 2023-02-09 DIAGNOSIS — K1379 Other lesions of oral mucosa: Secondary | ICD-10-CM | POA: Diagnosis not present

## 2023-02-09 LAB — CBC WITH DIFFERENTIAL/PLATELET
Absolute Monocytes: 790 {cells}/uL (ref 200–950)
Basophils Absolute: 94 {cells}/uL (ref 0–200)
Basophils Relative: 1 %
Eosinophils Absolute: 216 {cells}/uL (ref 15–500)
Eosinophils Relative: 2.3 %
HCT: 42.3 % (ref 35.0–45.0)
Hemoglobin: 14.2 g/dL (ref 11.7–15.5)
Lymphs Abs: 4004 {cells}/uL — ABNORMAL HIGH (ref 850–3900)
MCH: 30.5 pg (ref 27.0–33.0)
MCHC: 33.6 g/dL (ref 32.0–36.0)
MCV: 91 fL (ref 80.0–100.0)
MPV: 10.1 fL (ref 7.5–12.5)
Monocytes Relative: 8.4 %
Neutro Abs: 4296 {cells}/uL (ref 1500–7800)
Neutrophils Relative %: 45.7 %
Platelets: 237 Thousand/uL (ref 140–400)
RBC: 4.65 Million/uL (ref 3.80–5.10)
RDW: 13.2 % (ref 11.0–15.0)
Total Lymphocyte: 42.6 %
WBC: 9.4 Thousand/uL (ref 3.8–10.8)

## 2023-02-09 MED ORDER — PREDNISONE 20 MG PO TABS
ORAL_TABLET | ORAL | 0 refills | Status: AC
  Filled 2023-02-09: qty 5, 4d supply, fill #0

## 2023-02-09 MED ORDER — NYSTATIN 100000 UNIT/ML MT SUSP
5.0000 mL | Freq: Three times a day (TID) | OROMUCOSAL | 0 refills | Status: DC
  Filled 2023-02-09: qty 180, 12d supply, fill #0

## 2023-02-09 NOTE — Patient Instructions (Signed)
-   Eat Bland diet.Avoid spices foods. - Notify provider or go to ED if symptoms worsen

## 2023-02-09 NOTE — Progress Notes (Signed)
Provider: Richarda Blade FNP-C  Mainor Hellmann, Donalee Citrin, NP  Patient Care Team: Fleeta Kunde, Donalee Citrin, NP as PCP - General (Family Medicine)  Extended Emergency Contact Information Primary Emergency Contact: [facility] Address: [address]          [place], [place] [address] D[place] of [place] Home Phone: [phone] Relation: Spouse Secondary Emergency Contact: Dagostino,Rushdan Address: [address]          [place], [place] [address] [place] of [place] Home Phone: [phone] Mobile Phone: [phone] Relation: Son  Code Status:  Full Code  Goals of care: Advanced Directive information    10/15/2022    9:45 AM  Advanced Directives  Does Patient Have a Medical Advance Directive? No  Does patient want to make changes to medical advance directive? No - Patient declined     Chief Complaint  Patient presents with   Acute Visit    Patient presents today for lips and mouth swelling since Sunday,02/07/23. She reports not able to eat for 2 days. Patient reports some numbness as well. She tried salt water and hydrogen peroxide rinse.    HPI:  Pt is a 72 y.o. female seen today for an acute visit for evaluation of swelling on the lips and mouth x 4 days.states woke up on Saturday morning with painful swollen lips. States was unable to swallow or eat.Has been rinsing with salt water and Hydrogen peroxide. she denies any rash,fever,chills,malaise,Throat closing up or shortness of breath.does not recall using any new supplement or medication.Had stake the night before she developed the symptoms but did not like it so she did not eat all of it.  Has noticed left lymph node swelling and tender to touch.     Past Medical History:  Diagnosis Date   Allergy    seasonal allergies   Asthma    uses unhaler   Blood transfusion without reported diagnosis 1990's   Cataract    bilateral sx   Depression    on meds   High blood pressure    on meds   HIV (human immunodeficiency virus  infection) (HCC)    on meds   Hx of adenomatous polyp of colon 2015   Hyperlipidemia    on meds   Osteoarthritis    Osteopenia    Past Surgical History:  Procedure Laterality Date   Biopsy of Liver     CATARACT EXTRACTION, BILATERAL     CHOLECYSTECTOMY     COLONOSCOPY  2015   MAC-Dr.Liakos-prep(exc)-TA x 1   CYST REMOVAL HAND Left    CYSTECTOMY     off of back   LAPAROSCOPIC SALPINGOOPHERECTOMY Right    POLYPECTOMY  2015   TA    No Known Allergies  Outpatient Encounter Medications as of 02/09/2023  Medication Sig   albuterol (PROVENTIL) (2.5 MG/3ML) 0.083% nebulizer solution Take 3 mLs (2.5 mg total) by nebulization every 4 (four) hours as needed for wheezing or shortness of breath.   albuterol (VENTOLIN HFA) 108 (90 Base) MCG/ACT inhaler INHALE 2 PUFFS BY MOUTH INTO THE LUNGS EVERY 4 HOURS AS NEEDED   ascorbic acid (VITAMIN C) 500 MG tablet Take by mouth.   bictegravir-emtricitabine-tenofovir AF (BIKTARVY) 50-200-25 MG TABS tablet TAKE 1 TABLET BY MOUTH DAILY.   Biotin w/ Vitamins C & E (HAIR/SKIN/NAILS PO) Take by mouth.   Blood Pressure Monitoring (BLOOD PRESSURE CUFF) MISC 1 Device by Does not apply route daily. DX: I10   Calcium Carbonate (CALCIUM 600 PO) Take 1 tablet by mouth daily.  cetirizine (ZYRTEC) 10 MG tablet TAKE 1 TABLET (10 MG TOTAL) BY MOUTH DAILY.   Cholecalciferol (VITAMIN D) 125 MCG (5000 UT) CAPS Take by mouth.   Dupilumab (DUPIXENT) 300 MG/2ML SOPN Inject 300 mg into the skin every 14 (fourteen) days.   ELDERBERRY PO Take by mouth.   enalapril (VASOTEC) 20 MG tablet Take 1 tablet (20 mg total) by mouth daily.   fluticasone (FLONASE) 50 MCG/ACT nasal spray Place 2 sprays into both nostrils daily.   fluticasone-salmeterol (ADVAIR HFA) 115-21 MCG/ACT inhaler Inhale 1 puff into the lungs 2 (two) times daily.   hydrochlorothiazide (HYDRODIURIL) 25 MG tablet Take 1 tablet (25 mg total) by mouth daily.   metoprolol tartrate (LOPRESSOR) 100 MG tablet Take 1  tablet (100 mg total) by mouth daily.   montelukast (SINGULAIR) 10 MG tablet TAKE 1 TABLET (10 MG TOTAL) BY MOUTH AT BEDTIME.   Multiple Vitamins-Minerals (MULTIVITAMIN GUMMIES ADULT PO) Take 2 tablets by mouth daily. Gummy   Multiple Vitamins-Minerals (ZINC PO) Take by mouth.   RSV vaccine recomb adjuvanted (AREXVY) 120 MCG/0.5ML injection Inject into the muscle.   sertraline (ZOLOFT) 25 MG tablet Take 1 tablet (25 mg total) by mouth daily.   Spacer/Aero-Holding Chambers DEVI Use with advair HFA   SPIKEVAX syringe Inject 0.5 mLs into the muscle once.   traZODone (DESYREL) 50 MG tablet Take 1/2-1 tablet (25-50 mg total) by mouth at bedtime as needed for sleep.   vitamin B-12 (CYANOCOBALAMIN) 500 MCG tablet Take 500 mcg by mouth daily.   pravastatin (PRAVACHOL) 20 MG tablet TAKE 1 TABLET BY MOUTH ONCE DAILY   [DISCONTINUED] linaclotide (LINZESS) 145 MCG CAPS capsule Take 1 capsule (145 mcg total) by mouth daily before breakfast.   Facility-Administered Encounter Medications as of 02/09/2023  Medication   0.9 %  sodium chloride infusion    Review of Systems  Constitutional:  Negative for appetite change, chills, fatigue, fever and unexpected weight change.  HENT:  Negative for congestion, ear discharge, ear pain, hearing loss, nosebleeds, postnasal drip, rhinorrhea, sinus pressure, sinus pain, sneezing, sore throat, tinnitus and trouble swallowing.        Swollen lips and mouth sores.pain with swallowing   Eyes:  Negative for pain, discharge, redness, itching and visual disturbance.  Respiratory:  Negative for cough, chest tightness, shortness of breath and wheezing.   Cardiovascular:  Negative for chest pain, palpitations and leg swelling.  Gastrointestinal:  Negative for abdominal distention, abdominal pain, blood in stool, constipation, diarrhea, nausea and vomiting.  Endocrine: Negative for cold intolerance, heat intolerance, polydipsia, polyphagia and polyuria.  Genitourinary:  Negative  for difficulty urinating, dysuria, flank pain, frequency and urgency.  Musculoskeletal:  Negative for arthralgias, back pain, gait problem, joint swelling, myalgias, neck pain and neck stiffness.  Skin:  Negative for color change, pallor, rash and wound.  Neurological:  Negative for dizziness, syncope, speech difficulty, weakness, light-headedness, numbness and headaches.  Hematological:  Does not bruise/bleed easily.  Psychiatric/Behavioral:  Negative for agitation, behavioral problems, confusion, hallucinations and sleep disturbance. The patient is not nervous/anxious.     Immunization History  Administered Date(s) Administered   Fluad Quad(high Dose 65+) 05/16/2019, 05/31/2020, 06/06/2021, 06/16/2022   Influenza,inj,Quad PF,6+ Mos 06/17/2017, 06/09/2018   Influenza-Unspecified 06/11/2016   Moderna Covid-19 Vaccine Bivalent Booster 89yrs & up 07/12/2021   Moderna Sars-Covid-2 Vaccination 07/06/2020   PFIZER(Purple Top)SARS-COV-2 Vaccination 10/20/2019, 11/14/2019   Pneumococcal Conjugate-13 05/20/2018   Pneumococcal Polysaccharide-23 02/15/2017   Respiratory Syncytial Virus Vaccine,Recomb Aduvanted(Arexvy) 09/21/2022   Tdap 09/21/2017  Zoster Recombinat (Shingrix) 07/19/2018, 02/13/2019   Pertinent  Health Maintenance Due  Topic Date Due   INFLUENZA VACCINE  04/15/2023   MAMMOGRAM  07/01/2024   Colonoscopy  12/17/2027   DEXA SCAN  Completed      08/13/2022   10:11 AM 08/17/2022    1:15 PM 09/21/2022    8:45 AM 10/06/2022    1:40 PM 10/15/2022    9:45 AM  Fall Risk  Falls in the past year? 0 0 0 0 0  Was there an injury with Fall? 0   0 0  Fall Risk Category Calculator 0   0 0  Fall Risk Category (Retired) Low      (RETIRED) Patient Fall Risk Level Low fall risk  Low fall risk    Patient at Risk for Falls Due to No Fall Risks  No Fall Risks No Fall Risks No Fall Risks  Fall risk Follow up Falls evaluation completed Falls evaluation completed Falls evaluation completed Falls  evaluation completed Falls evaluation completed   Functional Status Survey:    Vitals:   02/09/23 1404  BP: 120/76  Pulse: 66  Temp: 98.4 F (36.9 C)  SpO2: 96%  Weight: 177 lb (80.3 kg)  Height: 5\' 7"  (1.702 m)   Body mass index is 27.72 kg/m. Physical Exam Vitals reviewed.  Constitutional:      General: She is not in acute distress.    Appearance: Normal appearance. She is normal weight. She is not ill-appearing or diaphoretic.  HENT:     Head: Normocephalic.     Right Ear: There is impacted cerumen.     Left Ear: Tympanic membrane, ear canal and external ear normal. There is no impacted cerumen.     Nose: Nose normal. No congestion or rhinorrhea.     Mouth/Throat:     Mouth: Mucous membranes are moist.     Pharynx: Posterior oropharyngeal erythema present. No oropharyngeal exudate.     Comments: Scattered whitish oral lesion with erythema noted on lower,upper lips and bilateral buccal area but none on the tongue. No blisters,vesicles or drainage noted.Pharynx clear.Both upper and lower lips swollen.  Eyes:     General: No scleral icterus.       Right eye: No discharge.        Left eye: No discharge.     Extraocular Movements: Extraocular movements intact.     Conjunctiva/sclera: Conjunctivae normal.     Pupils: Pupils are equal, round, and reactive to light.  Neck:     Vascular: No carotid bruit.  Cardiovascular:     Rate and Rhythm: Normal rate and regular rhythm.     Pulses: Normal pulses.     Heart sounds: Normal heart sounds. No murmur heard.    No friction rub. No gallop.  Pulmonary:     Effort: Pulmonary effort is normal. No respiratory distress.     Breath sounds: Normal breath sounds. No wheezing, rhonchi or rales.  Chest:     Chest wall: No tenderness.  Abdominal:     General: Bowel sounds are normal. There is no distension.     Palpations: Abdomen is soft. There is no mass.     Tenderness: There is no abdominal tenderness. There is no right CVA  tenderness, left CVA tenderness, guarding or rebound.  Musculoskeletal:        General: No swelling or tenderness. Normal range of motion.     Cervical back: Normal range of motion. No rigidity or tenderness.  Right lower leg: No edema.     Left lower leg: No edema.  Lymphadenopathy:     Head:     Left side of head: Tonsillar adenopathy present.     Cervical: No cervical adenopathy.  Skin:    General: Skin is warm and dry.     Coloration: Skin is not pale.     Findings: No bruising, erythema, lesion or rash.  Neurological:     Mental Status: She is alert and oriented to person, place, and time.     Cranial Nerves: No cranial nerve deficit.     Sensory: No sensory deficit.     Motor: No weakness.     Coordination: Coordination normal.     Gait: Gait normal.  Psychiatric:        Mood and Affect: Mood normal.        Speech: Speech normal.        Behavior: Behavior normal.        Thought Content: Thought content normal.        Judgment: Judgment normal.     Labs reviewed: Recent Labs    03/16/22 0916 09/21/22 0917  NA 140 143  K 4.2 3.8  CL 101 102  CO2 34* 32  GLUCOSE 104* 86  BUN 14 14  CREATININE 1.04* 0.97  CALCIUM 9.8 9.8   Recent Labs    03/16/22 0916 09/21/22 0917  AST 20 18  ALT 17 16  BILITOT 0.8 0.5  PROT 6.6 6.7   Recent Labs    03/16/22 0916 09/21/22 0912 09/21/22 0917  WBC 7.3 8.6 8.6  NEUTROABS 2,679 3.2 3,139  HGB 14.8 13.6 13.5  HCT 43.7 41.1 39.0  MCV 93.4 94 89.7  PLT 239 285 266   Lab Results  Component Value Date   TSH 1.43 08/16/2017   No results found for: "HGBA1C" Lab Results  Component Value Date   CHOL 159 09/21/2022   HDL 47 (L) 09/21/2022   LDLCALC 90 09/21/2022   TRIG 123 09/21/2022   CHOLHDL 3.4 09/21/2022    Significant Diagnostic Results in last 30 days:  No results found.  Assessment/Plan  1.Mouth sores Afebrile  Scattered whitish oral lesion with erythema noted on lower,upper lips and bilateral  buccal area but none on the tongue. No blisters,vesicles or drainage noted.Pharynx clear.Both upper and lower lips swollen.  unclear etiology but suspect medication side effects or food allergy reaction.will start on Taper prednisone as below along mouth wash with lidocaine to easy the pain.   - will follow up in 3 days to re-evaluate.Advised to notify provider or go to ED if symptoms worsen  - CBC with Differential/Platelet - magic mouthwash (nystatin, lidocaine, diphenhydrAMINE, alum & mag hydroxide) suspension; Swish and swallow 5 mLs 3 (three) times daily.  Dispense: 180 mL; Refill: 0  2. Mouth swelling upper and lower lips swollen. Left tonsillar swollen and tender to palpation.unclear etiology but suspect medication side effects or food allergy reaction. - CBC with Differential/Platelet - predniSONE (DELTASONE) 20 MG tablet; Take 2 tablets (40 mg total) by mouth daily with breakfast for 1 day, THEN 1.5 tablets (30 mg total) daily with breakfast for 1 day, THEN 1 tablet (20 mg total) daily with breakfast for 1 day, THEN 0.5 tablets (10 mg total) daily with breakfast for 1 day.  Dispense: 5 tablet; Refill: 0  Family/ staff Communication: Reviewed plan of care with patient verbalized understanding   Labs/tests ordered:  - CBC with Differential/Platelet  Next Appointment:  Return in about 3 days (around 02/12/2023) for swollen lips/mouth .   Caesar Bookman, NP

## 2023-02-10 ENCOUNTER — Other Ambulatory Visit: Payer: Self-pay

## 2023-02-10 ENCOUNTER — Other Ambulatory Visit (HOSPITAL_COMMUNITY): Payer: Self-pay

## 2023-02-12 ENCOUNTER — Ambulatory Visit (INDEPENDENT_AMBULATORY_CARE_PROVIDER_SITE_OTHER): Payer: 59 | Admitting: Family

## 2023-02-12 ENCOUNTER — Encounter: Payer: Self-pay | Admitting: Family

## 2023-02-12 ENCOUNTER — Other Ambulatory Visit (HOSPITAL_COMMUNITY): Payer: Self-pay

## 2023-02-12 VITALS — BP 120/82 | HR 63 | Temp 97.6°F | Resp 17 | Ht 67.0 in | Wt 173.6 lb

## 2023-02-12 DIAGNOSIS — R220 Localized swelling, mass and lump, head: Secondary | ICD-10-CM | POA: Diagnosis not present

## 2023-02-12 DIAGNOSIS — K1379 Other lesions of oral mucosa: Secondary | ICD-10-CM | POA: Diagnosis not present

## 2023-02-12 NOTE — Progress Notes (Signed)
Provider: Richarda Blade FNP-C  Maria Adkins, Maria Citrin, NP  Patient Care Team: Maria Adkins, Maria Citrin, NP as PCP - General (Family Medicine)  Extended Emergency Contact Information Primary Emergency Contact: Maria Adkins Address: [address]          [place], [place] [address] Darden [place] of [place] Home Phone: [phone] Relation: Spouse Secondary Emergency Contact: Maria Adkins Address: [address]          [place], [place] [address] [place] of [place] Home Phone: [phone] Mobile Phone: [phone] Relation: Son  Code Status:  DNR Goals of care: Advanced Directive information    02/12/2023   12:20 PM  Advanced Directives  Does Patient Have a Medical Advance Directive? No     Chief Complaint  Patient presents with   Medical Management of Chronic Issues    Patient is here for a follow up for her mouth pt states it is a little better    HPI:  Pt is a 72 y.o. female seen today for an acute visit for follow up on lip swelling and mouth sores.she was here 02/09/2023 was prescribed tapered prednisone and magic mouthwash then advised to follow up today.states symptoms have improved still has two spots in the mouth that are still sore but not like previously.Has been able to eat without any pain.  She denies any fever or chills.  States watched the advertisement on the TV regarding Maria Adkins that it causes mouth swelling wonders whether her symptoms are related to Maria Adkins though states has been on the medication for many years without any issues.I have discuss with her to contact specialist to discuss use of Maria Adkins and recent mouth sores and swelling.    Past Medical History:  Diagnosis Date   Allergy    seasonal allergies   Asthma    uses unhaler   Blood transfusion without reported diagnosis 1990's   Cataract    bilateral sx   Depression    on meds   High blood pressure    on meds   HIV (human immunodeficiency virus infection) (HCC)    on meds   Hx of  adenomatous polyp of colon 2015   Hyperlipidemia    on meds   Osteoarthritis    Osteopenia    Past Surgical History:  Procedure Laterality Date   Biopsy of Liver     CATARACT EXTRACTION, BILATERAL     CHOLECYSTECTOMY     COLONOSCOPY  2015   MAC-Dr.Liakos-prep(exc)-TA x 1   CYST REMOVAL HAND Left    CYSTECTOMY     off of back   LAPAROSCOPIC SALPINGOOPHERECTOMY Right    POLYPECTOMY  2015   TA    No Known Allergies  Outpatient Encounter Medications as of 02/12/2023  Medication Sig   albuterol (PROVENTIL) (2.5 MG/3ML) 0.083% nebulizer solution Take 3 mLs (2.5 mg total) by nebulization every 4 (four) hours as needed for wheezing or shortness of breath.   albuterol (VENTOLIN HFA) 108 (90 Base) MCG/ACT inhaler INHALE 2 PUFFS BY MOUTH INTO THE LUNGS EVERY 4 HOURS AS NEEDED   ascorbic acid (VITAMIN C) 500 MG tablet Take by mouth.   bictegravir-emtricitabine-tenofovir AF (BIKTARVY) 50-200-25 MG TABS tablet TAKE 1 TABLET BY MOUTH DAILY.   Biotin w/ Vitamins C & E (HAIR/SKIN/NAILS PO) Take by mouth.   Blood Pressure Monitoring (BLOOD PRESSURE CUFF) MISC 1 Device by Does not apply route daily. DX: I10   Calcium Carbonate (CALCIUM 600 PO) Take 1 tablet by mouth daily.    cetirizine (ZYRTEC) 10 MG  tablet TAKE 1 TABLET (10 MG TOTAL) BY MOUTH DAILY.   Cholecalciferol (VITAMIN D) 125 MCG (5000 UT) CAPS Take by mouth.   Dupilumab (Maria Adkins) 300 MG/2ML SOPN Inject 300 mg into the skin every 14 (fourteen) days.   ELDERBERRY PO Take by mouth.   enalapril (VASOTEC) 20 MG tablet Take 1 tablet (20 mg total) by mouth daily.   fluticasone (FLONASE) 50 MCG/ACT nasal spray Place 2 sprays into both nostrils daily.   fluticasone-salmeterol (ADVAIR HFA) 115-21 MCG/ACT inhaler Inhale 1 puff into the lungs 2 (two) times daily.   hydrochlorothiazide (HYDRODIURIL) 25 MG tablet Take 1 tablet (25 mg total) by mouth daily.   magic mouthwash (nystatin, lidocaine, diphenhydrAMINE, alum & mag hydroxide) suspension  Swish and swallow 5 mLs 3 (three) times daily.   metoprolol tartrate (LOPRESSOR) 100 MG tablet Take 1 tablet (100 mg total) by mouth daily.   montelukast (SINGULAIR) 10 MG tablet TAKE 1 TABLET (10 MG TOTAL) BY MOUTH AT BEDTIME.   Multiple Vitamins-Minerals (MULTIVITAMIN GUMMIES ADULT PO) Take 2 tablets by mouth daily. Gummy   Multiple Vitamins-Minerals (ZINC PO) Take by mouth.   predniSONE (DELTASONE) 20 MG tablet Take 2 tablets (40 mg total) by mouth daily with breakfast for 1 day, THEN 1.5 tablets (30 mg total) daily with breakfast for 1 day, THEN 1 tablet (20 mg total) daily with breakfast for 1 day, THEN 0.5 tablets (10 mg total) daily with breakfast for 1 day.   RSV vaccine recomb adjuvanted (AREXVY) 120 MCG/0.5ML injection Inject into the muscle.   sertraline (ZOLOFT) 25 MG tablet Take 1 tablet (25 mg total) by mouth daily.   Spacer/Aero-Holding Chambers DEVI Use with advair HFA   SPIKEVAX syringe Inject 0.5 mLs into the muscle once.   traZODone (DESYREL) 50 MG tablet Take 1/2-1 tablet (25-50 mg total) by mouth at bedtime as needed for sleep.   vitamin B-12 (CYANOCOBALAMIN) 500 MCG tablet Take 500 mcg by mouth daily.   pravastatin (PRAVACHOL) 20 MG tablet TAKE 1 TABLET BY MOUTH ONCE DAILY   [DISCONTINUED] linaclotide (LINZESS) 145 MCG CAPS capsule Take 1 capsule (145 mcg total) by mouth daily before breakfast.   Facility-Administered Encounter Medications as of 02/12/2023  Medication   0.9 %  sodium chloride infusion    Review of Systems  Constitutional:  Negative for appetite change, chills, fatigue, fever and unexpected weight change.  HENT:  Positive for mouth sores. Negative for congestion, dental problem, ear discharge, ear pain, facial swelling, hearing loss, nosebleeds, postnasal drip, rhinorrhea, sinus pressure, sinus pain, sneezing, sore throat, tinnitus and trouble swallowing.        Mouth sores and swelling has improved   Eyes:  Negative for pain, discharge, redness, itching  and visual disturbance.  Respiratory:  Negative for cough, chest tightness, shortness of breath and wheezing.   Cardiovascular:  Negative for chest pain, palpitations and leg swelling.  Gastrointestinal:  Negative for abdominal distention, abdominal pain, blood in stool, constipation, diarrhea, nausea and vomiting.  Musculoskeletal:  Negative for arthralgias, back pain, gait problem, myalgias, neck pain and neck stiffness.  Skin:  Negative for color change, pallor, rash and wound.  Neurological:  Negative for dizziness, light-headedness, numbness and headaches.    Immunization History  Administered Date(s) Administered   Fluad Quad(high Dose 65+) 05/16/2019, 05/31/2020, 06/06/2021, 06/16/2022   Influenza,inj,Quad PF,6+ Mos 06/17/2017, 06/09/2018   Influenza-Unspecified 06/11/2016   Moderna Covid-19 Vaccine Bivalent Booster 29yrs & up 07/12/2021   Moderna Sars-Covid-2 Vaccination 07/06/2020   PFIZER(Purple Top)SARS-COV-2 Vaccination 10/20/2019,  11/14/2019   Pneumococcal Conjugate-13 05/20/2018   Pneumococcal Polysaccharide-23 02/15/2017   Respiratory Syncytial Virus Vaccine,Recomb Aduvanted(Arexvy) 09/21/2022   Tdap 09/21/2017   Zoster Recombinat (Shingrix) 07/19/2018, 02/13/2019   Pertinent  Health Maintenance Due  Topic Date Due   INFLUENZA VACCINE  04/15/2023   MAMMOGRAM  07/01/2024   Colonoscopy  12/17/2027   DEXA SCAN  Completed      08/17/2022    1:15 PM 09/21/2022    8:45 AM 10/06/2022    1:40 PM 10/15/2022    9:45 AM 02/12/2023   12:20 PM  Fall Risk  Falls in the past year? 0 0 0 0 0  Was there an injury with Fall?   0 0 0  Fall Risk Category Calculator   0 0 0  (RETIRED) Patient Fall Risk Level  Low fall risk     Patient at Risk for Falls Due to  No Fall Risks No Fall Risks No Fall Risks   Fall risk Follow up Falls evaluation completed Falls evaluation completed Falls evaluation completed Falls evaluation completed Falls evaluation completed   Functional Status Survey:     Vitals:   02/12/23 1217  BP: 120/82  Pulse: 63  Resp: 17  Temp: 97.6 F (36.4 C)  TempSrc: Temporal  SpO2: 96%  Weight: 173 lb 9.6 oz (78.7 kg)  Height: 5\' 7"  (1.702 m)   Body mass index is 27.19 kg/m. Physical Exam Vitals reviewed.  Constitutional:      General: She is not in acute distress.    Appearance: Normal appearance. She is overweight. She is not ill-appearing or diaphoretic.  HENT:     Head: Normocephalic.     Right Ear: Tympanic membrane, ear canal and external ear normal. There is no impacted cerumen.     Left Ear: Tympanic membrane, ear canal and external ear normal. There is no impacted cerumen.     Nose: Nose normal. No congestion or rhinorrhea.     Mouth/Throat:     Mouth: Mucous membranes are moist.     Pharynx: Oropharynx is clear. No oropharyngeal exudate or posterior oropharyngeal erythema.  Eyes:     General: No scleral icterus.       Right eye: No discharge.        Left eye: No discharge.     Extraocular Movements: Extraocular movements intact.     Conjunctiva/sclera: Conjunctivae normal.     Pupils: Pupils are equal, round, and reactive to light.  Cardiovascular:     Rate and Rhythm: Normal rate and regular rhythm.     Pulses: Normal pulses.     Heart sounds: Normal heart sounds. No murmur heard.    No friction rub. No gallop.  Pulmonary:     Effort: Pulmonary effort is normal. No respiratory distress.     Breath sounds: Normal breath sounds. No wheezing, rhonchi or rales.  Chest:     Chest wall: No tenderness.  Abdominal:     General: Bowel sounds are normal. There is no distension.     Palpations: Abdomen is soft. There is no mass.     Tenderness: There is no abdominal tenderness. There is no right CVA tenderness, left CVA tenderness, guarding or rebound.  Musculoskeletal:        General: No swelling, tenderness or deformity. Normal range of motion.     Cervical back: Normal range of motion. No rigidity or tenderness.     Right lower  leg: No edema.     Left lower leg: No  edema.  Lymphadenopathy:     Cervical: No cervical adenopathy.  Skin:    General: Skin is warm and dry.     Coloration: Skin is not pale.     Findings: No bruising, erythema, lesion or rash.  Neurological:     Mental Status: She is alert and oriented to person, place, and time.     Motor: No weakness.     Gait: Gait normal.  Psychiatric:        Mood and Affect: Mood normal.        Speech: Speech normal.        Behavior: Behavior normal.     Labs reviewed: Recent Labs    03/16/22 0916 09/21/22 0917  NA 140 143  K 4.2 3.8  CL 101 102  CO2 34* 32  GLUCOSE 104* 86  BUN 14 14  CREATININE 1.04* 0.97  CALCIUM 9.8 9.8   Recent Labs    03/16/22 0916 09/21/22 0917  AST 20 18  ALT 17 16  BILITOT 0.8 0.5  PROT 6.6 6.7   Recent Labs    09/21/22 0912 09/21/22 0917 02/09/23 1518  WBC 8.6 8.6 9.4  NEUTROABS 3.2 3,139 4,296  HGB 13.6 13.5 14.2  HCT 41.1 39.0 42.3  MCV 94 89.7 91.0  PLT 285 266 237   Lab Results  Component Value Date   TSH 1.43 08/16/2017   No results found for: "HGBA1C" Lab Results  Component Value Date   CHOL 159 09/21/2022   HDL 47 (L) 09/21/2022   LDLCALC 90 09/21/2022   TRIG 123 09/21/2022   CHOLHDL 3.4 09/21/2022    Significant Diagnostic Results in last 30 days:  No results found.  Assessment/Plan 1. Mouth sores Afebrile. X 2 spots on inner lower lip and left buccal feels sore but no ulceration or lesion noted this visit. Previous enlarge tonsillar resolved. - continue magic mouth wash swish and swallow until symptoms resolve. - advised to notify provider or go to ED if symptoms recurs or worsen - she will contact specialist office to discuss use of Maria Adkins whether could be a side effect.   2. Mouth swelling Resolved.   Family/ staff Communication: Reviewed plan of care with patient verbalized understanding   Labs/tests ordered: None   Next Appointment: Return if symptoms worsen or fail  to improve.   Maria Bookman, NP

## 2023-02-18 ENCOUNTER — Telehealth: Payer: Self-pay

## 2023-02-18 NOTE — Telephone Encounter (Signed)
Patient called, she is experiencing swelling inside her mouth on the gums and lips. She recently saw her PCP for this and they prescribed prednisone and mouth wash. She reports her symptoms have returned.   We moved her follow up with Dr. Drue Second up to 6/24. Advised her to contact her PCP to see if they can get her in sooner since they have been managing this for her. Patient verbalized understanding and has no further questions.   Sandie Ano, RN

## 2023-02-19 ENCOUNTER — Encounter: Payer: Self-pay | Admitting: Family

## 2023-02-19 ENCOUNTER — Other Ambulatory Visit (HOSPITAL_BASED_OUTPATIENT_CLINIC_OR_DEPARTMENT_OTHER): Payer: Self-pay

## 2023-02-19 ENCOUNTER — Ambulatory Visit (INDEPENDENT_AMBULATORY_CARE_PROVIDER_SITE_OTHER): Payer: 59 | Admitting: Family

## 2023-02-19 VITALS — BP 122/76 | HR 60 | Ht 67.0 in | Wt 175.0 lb

## 2023-02-19 DIAGNOSIS — K1379 Other lesions of oral mucosa: Secondary | ICD-10-CM

## 2023-02-19 DIAGNOSIS — B379 Candidiasis, unspecified: Secondary | ICD-10-CM | POA: Diagnosis not present

## 2023-02-19 MED ORDER — ALUM & MAG HYDROXIDE-SIMETH 200-200-20 MG/5ML PO SUSP
5.0000 mL | Freq: Three times a day (TID) | ORAL | 0 refills | Status: DC
Start: 2023-02-19 — End: 2023-03-08
  Filled 2023-02-19: qty 180, 12d supply, fill #0

## 2023-02-19 MED ORDER — FLUCONAZOLE 150 MG PO TABS
ORAL_TABLET | ORAL | 0 refills | Status: DC
Start: 2023-02-19 — End: 2023-10-19
  Filled 2023-02-19: qty 2, 7d supply, fill #0

## 2023-02-19 NOTE — Progress Notes (Signed)
Provider: Richarda Blade FNP-C  Murlean Seelye, Donalee Citrin, NP  Patient Care Team: Female Minish, Donalee Citrin, NP as PCP - General (Family Medicine)  Extended Emergency Contact Information Primary Emergency Contact: [facility] Address: [address]          [place], [place] [address] [place] of [place] Home Phone: [phone] Relation: Spouse Secondary Emergency Contact: Brandhorst,Rushdan Address: [address]          [place], [place] [address] [place] of [place] Home Phone: [phone] Mobile Phone: [phone] Relation: Son  Code Status:  Full Code  Goals of care: Advanced Directive information    02/12/2023   12:20 PM  Advanced Directives  Does Patient Have a Medical Advance Directive? No     Chief Complaint  Patient presents with   Acute Visit    Patient presents today for a follow-up on mouth/gum pain.    HPI:  Pt is a 72 y.o. female seen today for an acute visit for evaluation of mouth and gum pain.she was here 02/09/2023 with complains of mouth sores and swollen lips.she was started on prednisone and magic mouth wash.Had a follow up 02/12/2023 symptoms had improved.     Past Medical History:  Diagnosis Date   Allergy    seasonal allergies   Asthma    uses unhaler   Blood transfusion without reported diagnosis 1990's   Cataract    bilateral sx   Depression    on meds   High blood pressure    on meds   HIV (human immunodeficiency virus infection) (HCC)    on meds   Hx of adenomatous polyp of colon 2015   Hyperlipidemia    on meds   Osteoarthritis    Osteopenia    Past Surgical History:  Procedure Laterality Date   Biopsy of Liver     CATARACT EXTRACTION, BILATERAL     CHOLECYSTECTOMY     COLONOSCOPY  2015   MAC-Dr.Liakos-prep(exc)-TA x 1   CYST REMOVAL HAND Left    CYSTECTOMY     off of back   LAPAROSCOPIC SALPINGOOPHERECTOMY Right    POLYPECTOMY  2015   TA    No Known Allergies  Outpatient Encounter Medications as of 02/19/2023  Medication  Sig   albuterol (PROVENTIL) (2.5 MG/3ML) 0.083% nebulizer solution Take 3 mLs (2.5 mg total) by nebulization every 4 (four) hours as needed for wheezing or shortness of breath.   albuterol (VENTOLIN HFA) 108 (90 Base) MCG/ACT inhaler INHALE 2 PUFFS BY MOUTH INTO THE LUNGS EVERY 4 HOURS AS NEEDED   ascorbic acid (VITAMIN C) 500 MG tablet Take by mouth.   bictegravir-emtricitabine-tenofovir AF (BIKTARVY) 50-200-25 MG TABS tablet TAKE 1 TABLET BY MOUTH DAILY.   Biotin w/ Vitamins C & E (HAIR/SKIN/NAILS PO) Take by mouth.   Blood Pressure Monitoring (BLOOD PRESSURE CUFF) MISC 1 Device by Does not apply route daily. DX: I10   Calcium Carbonate (CALCIUM 600 PO) Take 1 tablet by mouth daily.    cetirizine (ZYRTEC) 10 MG tablet TAKE 1 TABLET (10 MG TOTAL) BY MOUTH DAILY.   Cholecalciferol (VITAMIN D) 125 MCG (5000 UT) CAPS Take by mouth.   Dupilumab (DUPIXENT) 300 MG/2ML SOPN Inject 300 mg into the skin every 14 (fourteen) days.   ELDERBERRY PO Take by mouth.   enalapril (VASOTEC) 20 MG tablet Take 1 tablet (20 mg total) by mouth daily.   fluticasone (FLONASE) 50 MCG/ACT nasal spray Place 2 sprays into both nostrils daily.   fluticasone-salmeterol (ADVAIR HFA) 115-21 MCG/ACT inhaler  Inhale 1 puff into the lungs 2 (two) times daily.   hydrochlorothiazide (HYDRODIURIL) 25 MG tablet Take 1 tablet (25 mg total) by mouth daily.   magic mouthwash (nystatin, lidocaine, diphenhydrAMINE, alum & mag hydroxide) suspension Swish and swallow 5 mLs 3 (three) times daily.   metoprolol tartrate (LOPRESSOR) 100 MG tablet Take 1 tablet (100 mg total) by mouth daily.   montelukast (SINGULAIR) 10 MG tablet TAKE 1 TABLET (10 MG TOTAL) BY MOUTH AT BEDTIME.   Multiple Vitamins-Minerals (MULTIVITAMIN GUMMIES ADULT PO) Take 2 tablets by mouth daily. Gummy   Multiple Vitamins-Minerals (ZINC PO) Take by mouth.   RSV vaccine recomb adjuvanted (AREXVY) 120 MCG/0.5ML injection Inject into the muscle.   sertraline (ZOLOFT) 25 MG  tablet Take 1 tablet (25 mg total) by mouth daily.   Spacer/Aero-Holding Chambers DEVI Use with advair HFA   SPIKEVAX syringe Inject 0.5 mLs into the muscle once.   traZODone (DESYREL) 50 MG tablet Take 1/2-1 tablet (25-50 mg total) by mouth at bedtime as needed for sleep.   vitamin B-12 (CYANOCOBALAMIN) 500 MCG tablet Take 500 mcg by mouth daily.   pravastatin (PRAVACHOL) 20 MG tablet TAKE 1 TABLET BY MOUTH ONCE DAILY   [DISCONTINUED] linaclotide (LINZESS) 145 MCG CAPS capsule Take 1 capsule (145 mcg total) by mouth daily before breakfast.   Facility-Administered Encounter Medications as of 02/19/2023  Medication   0.9 %  sodium chloride infusion    Review of Systems  Constitutional:  Negative for appetite change, chills, fatigue, fever and unexpected weight change.  HENT:  Positive for mouth sores. Negative for congestion, dental problem, ear discharge, ear pain, facial swelling, hearing loss, nosebleeds, postnasal drip, rhinorrhea, sinus pressure, sinus pain, sneezing, sore throat and tinnitus.   Eyes:  Negative for pain, discharge, redness, itching and visual disturbance.  Respiratory:  Negative for cough, chest tightness, shortness of breath and wheezing.   Cardiovascular:  Negative for chest pain, palpitations and leg swelling.  Gastrointestinal:  Negative for abdominal distention, abdominal pain, blood in stool, constipation, diarrhea, nausea and vomiting.  Skin:  Negative for color change, pallor, rash and wound.  Psychiatric/Behavioral:  Negative for agitation, behavioral problems, confusion, hallucinations and sleep disturbance. The patient is not nervous/anxious.     Immunization History  Administered Date(s) Administered   Fluad Quad(high Dose 65+) 05/16/2019, 05/31/2020, 06/06/2021, 06/16/2022   Influenza,inj,Quad PF,6+ Mos 06/17/2017, 06/09/2018   Influenza-Unspecified 06/11/2016   Moderna Covid-19 Vaccine Bivalent Booster 31yrs & up 07/12/2021   Moderna Sars-Covid-2  Vaccination 07/06/2020   PFIZER(Purple Top)SARS-COV-2 Vaccination 10/20/2019, 11/14/2019   Pneumococcal Conjugate-13 05/20/2018   Pneumococcal Polysaccharide-23 02/15/2017   Respiratory Syncytial Virus Vaccine,Recomb Aduvanted(Arexvy) 09/21/2022   Tdap 09/21/2017   Zoster Recombinat (Shingrix) 07/19/2018, 02/13/2019   Pertinent  Health Maintenance Due  Topic Date Due   INFLUENZA VACCINE  04/15/2023   MAMMOGRAM  07/01/2024   Colonoscopy  12/17/2027   DEXA SCAN  Completed      08/17/2022    1:15 PM 09/21/2022    8:45 AM 10/06/2022    1:40 PM 10/15/2022    9:45 AM 02/12/2023   12:20 PM  Fall Risk  Falls in the past year? 0 0 0 0 0  Was there an injury with Fall?   0 0 0  Fall Risk Category Calculator   0 0 0  (RETIRED) Patient Fall Risk Level  Low fall risk     Patient at Risk for Falls Due to  No Fall Risks No Fall Risks No Fall Risks  Fall risk Follow up Falls evaluation completed Falls evaluation completed Falls evaluation completed Falls evaluation completed Falls evaluation completed   Functional Status Survey:    Vitals:   02/19/23 1114  BP: 122/76  Pulse: 60  SpO2: 98%  Weight: 175 lb (79.4 kg)  Height: 5\' 7"  (1.702 m)   Body mass index is 27.41 kg/m. Physical Exam Vitals reviewed.  Constitutional:      General: She is not in acute distress.    Appearance: Normal appearance. She is overweight. She is not ill-appearing or diaphoretic.  HENT:     Head: Normocephalic.     Right Ear: Tympanic membrane, ear canal and external ear normal. There is no impacted cerumen.     Left Ear: Tympanic membrane, ear canal and external ear normal. There is no impacted cerumen.     Nose: Nose normal. No congestion or rhinorrhea.     Mouth/Throat:     Mouth: Mucous membranes are moist.     Pharynx: Oropharynx is clear. No oropharyngeal exudate or posterior oropharyngeal erythema.     Comments: Inner lips sores tender to touch with whitish coating  Eyes:     General: No scleral  icterus.       Right eye: No discharge.        Left eye: No discharge.     Extraocular Movements: Extraocular movements intact.     Conjunctiva/sclera: Conjunctivae normal.     Pupils: Pupils are equal, round, and reactive to light.  Cardiovascular:     Rate and Rhythm: Normal rate and regular rhythm.     Pulses: Normal pulses.     Heart sounds: Normal heart sounds. No murmur heard.    No friction rub. No gallop.  Pulmonary:     Effort: Pulmonary effort is normal. No respiratory distress.     Breath sounds: Normal breath sounds. No wheezing, rhonchi or rales.  Chest:     Chest wall: No tenderness.  Abdominal:     General: Bowel sounds are normal. There is no distension.     Palpations: Abdomen is soft. There is no mass.     Tenderness: There is no abdominal tenderness. There is no right CVA tenderness, left CVA tenderness, guarding or rebound.  Skin:    General: Skin is warm and dry.     Coloration: Skin is not pale.     Findings: No bruising, erythema, lesion or rash.  Neurological:     Mental Status: She is alert and oriented to person, place, and time.     Cranial Nerves: No cranial nerve deficit.     Sensory: No sensory deficit.     Motor: No weakness.     Coordination: Coordination normal.     Gait: Gait normal.  Psychiatric:        Mood and Affect: Mood normal.        Speech: Speech normal.        Behavior: Behavior normal.     Labs reviewed: Recent Labs    03/16/22 0916 09/21/22 0917  NA 140 143  K 4.2 3.8  CL 101 102  CO2 34* 32  GLUCOSE 104* 86  BUN 14 14  CREATININE 1.04* 0.97  CALCIUM 9.8 9.8   Recent Labs    03/16/22 0916 09/21/22 0917  AST 20 18  ALT 17 16  BILITOT 0.8 0.5  PROT 6.6 6.7   Recent Labs    09/21/22 0912 09/21/22 0917 02/09/23 1518  WBC 8.6 8.6 9.4  NEUTROABS 3.2  3,139 4,296  HGB 13.6 13.5 14.2  HCT 41.1 39.0 42.3  MCV 94 89.7 91.0  PLT 285 266 237   Lab Results  Component Value Date   TSH 1.43 08/16/2017   No  results found for: "HGBA1C" Lab Results  Component Value Date   CHOL 159 09/21/2022   HDL 47 (L) 09/21/2022   LDLCALC 90 09/21/2022   TRIG 123 09/21/2022   CHOLHDL 3.4 09/21/2022    Significant Diagnostic Results in last 30 days:  No results found.  Assessment/Plan  1. Mouth sores sores tender to touch with whitish coating - will refilled magic mouthwash  - magic mouthwash (nystatin, lidocaine, diphenhydrAMINE, alum & mag hydroxide) suspension; Swish and swallow 5 mLs 3 (three) times daily.  Dispense: 180 mL; Refill: 0  2. Candidiasis whitish coating noted   - encouraged to rinse mouth after using her inhaler  - fluconazole (DIFLUCAN) 150 MG tablet; Take 1 tablet by mouth and repeat dose in one week  Dispense: 2 tablet; Refill: 0  Family/ staff Communication: Reviewed plan of care with patient verbalized understanding   Labs/tests ordered: None   Next Appointment: Return if symptoms worsen or fail to improve.   Caesar Bookman, NP

## 2023-02-26 ENCOUNTER — Other Ambulatory Visit: Payer: Self-pay | Admitting: Internal Medicine

## 2023-03-01 NOTE — Telephone Encounter (Signed)
Please advise on refill request

## 2023-03-02 ENCOUNTER — Other Ambulatory Visit (HOSPITAL_BASED_OUTPATIENT_CLINIC_OR_DEPARTMENT_OTHER): Payer: Self-pay

## 2023-03-02 NOTE — Telephone Encounter (Signed)
Patient has PCP, will have request routed to them for further management of BP.   Sandie Ano, RN

## 2023-03-08 ENCOUNTER — Encounter: Payer: Self-pay | Admitting: Internal Medicine

## 2023-03-08 ENCOUNTER — Other Ambulatory Visit (HOSPITAL_BASED_OUTPATIENT_CLINIC_OR_DEPARTMENT_OTHER): Payer: Self-pay

## 2023-03-08 ENCOUNTER — Other Ambulatory Visit: Payer: Self-pay | Admitting: Internal Medicine

## 2023-03-08 ENCOUNTER — Other Ambulatory Visit: Payer: Self-pay

## 2023-03-08 ENCOUNTER — Ambulatory Visit (INDEPENDENT_AMBULATORY_CARE_PROVIDER_SITE_OTHER): Payer: 59 | Admitting: Internal Medicine

## 2023-03-08 VITALS — BP 122/81 | HR 67 | Resp 16 | Ht 67.0 in | Wt 176.0 lb

## 2023-03-08 DIAGNOSIS — I10 Essential (primary) hypertension: Secondary | ICD-10-CM

## 2023-03-08 DIAGNOSIS — B20 Human immunodeficiency virus [HIV] disease: Secondary | ICD-10-CM

## 2023-03-08 DIAGNOSIS — Z79899 Other long term (current) drug therapy: Secondary | ICD-10-CM | POA: Diagnosis not present

## 2023-03-08 DIAGNOSIS — K1379 Other lesions of oral mucosa: Secondary | ICD-10-CM

## 2023-03-08 MED ORDER — VALACYCLOVIR HCL 1 G PO TABS
1000.0000 mg | ORAL_TABLET | Freq: Two times a day (BID) | ORAL | 0 refills | Status: DC
  Filled 2023-03-08: qty 14, 7d supply, fill #0

## 2023-03-08 MED ORDER — ALUM & MAG HYDROXIDE-SIMETH 200-200-20 MG/5ML PO SUSP
5.0000 mL | Freq: Three times a day (TID) | ORAL | 0 refills | Status: DC
Start: 2023-03-08 — End: 2023-04-19
  Filled 2023-03-08: qty 180, 12d supply, fill #0

## 2023-03-08 NOTE — Telephone Encounter (Signed)
Patient has PCP.   Sandie Ano, RN

## 2023-03-08 NOTE — Progress Notes (Signed)
[name], female   DOB: [birth year], 72 y.o.   MRN: [id number]  HPI 72yo F with well controlled hiv disease, on biktarvy. Taking meds faithfully 3 weeks ago, had mouth sores, received abtx, then, diagnosed with thrush, then treated with mouthwash x 2 wks, -felt better on the inside of her mouth but now still feels raw at rough of mouth and lips specifically . --now with chapped lips, and swollen lips, and cheliosis.  Had not used her inhaler.Marland Kitchenwhich they attribute symptoms for angular cheilitis  Unable to place her teeth in.  Wearing mask her mask about one hour per time maybe twice a week.  "Feels like alligator skin"  Outpatient Encounter Medications as of 03/08/2023  Medication Sig   albuterol (PROVENTIL) (2.5 MG/3ML) 0.083% nebulizer solution Take 3 mLs (2.5 mg total) by nebulization every 4 (four) hours as needed for wheezing or shortness of breath.   albuterol (VENTOLIN HFA) 108 (90 Base) MCG/ACT inhaler INHALE 2 PUFFS BY MOUTH INTO THE LUNGS EVERY 4 HOURS AS NEEDED   ascorbic acid (VITAMIN C) 500 MG tablet Take by mouth.   bictegravir-emtricitabine-tenofovir AF (BIKTARVY) 50-200-25 MG TABS tablet TAKE 1 TABLET BY MOUTH DAILY.   Biotin w/ Vitamins C & E (HAIR/SKIN/NAILS PO) Take by mouth.   Blood Pressure Monitoring (BLOOD PRESSURE CUFF) MISC 1 Device by Does not apply route daily. DX: I10   Calcium Carbonate (CALCIUM 600 PO) Take 1 tablet by mouth daily.    cetirizine (ZYRTEC) 10 MG tablet TAKE 1 TABLET (10 MG TOTAL) BY MOUTH DAILY.   Cholecalciferol (VITAMIN D) 125 MCG (5000 UT) CAPS Take by mouth.   Dupilumab (DUPIXENT) 300 MG/2ML SOPN Inject 300 mg into the skin every 14 (fourteen) days.   ELDERBERRY PO Take by mouth.   enalapril (VASOTEC) 20 MG tablet Take 1 tablet (20 mg total) by mouth daily.   fluticasone (FLONASE) 50 MCG/ACT nasal spray Place 2 sprays into both nostrils daily.   fluticasone-salmeterol (ADVAIR HFA) 115-21 MCG/ACT inhaler Inhale 1 puff  into the lungs 2 (two) times daily.   hydrochlorothiazide (HYDRODIURIL) 25 MG tablet Take 1 tablet (25 mg total) by mouth daily.   metoprolol tartrate (LOPRESSOR) 100 MG tablet Take 1 tablet (100 mg total) by mouth daily.   montelukast (SINGULAIR) 10 MG tablet TAKE 1 TABLET (10 MG TOTAL) BY MOUTH AT BEDTIME.   Multiple Vitamins-Minerals (MULTIVITAMIN GUMMIES ADULT PO) Take 2 tablets by mouth daily. Gummy   Multiple Vitamins-Minerals (ZINC PO) Take by mouth.   RSV vaccine recomb adjuvanted (AREXVY) 120 MCG/0.5ML injection Inject into the muscle.   sertraline (ZOLOFT) 25 MG tablet Take 1 tablet (25 mg total) by mouth daily.   Spacer/Aero-Holding Chambers DEVI Use with advair HFA   SPIKEVAX syringe Inject 0.5 mLs into the muscle once.   traZODone (DESYREL) 50 MG tablet Take 1/2-1 tablet (25-50 mg total) by mouth at bedtime as needed for sleep.   vitamin B-12 (CYANOCOBALAMIN) 500 MCG tablet Take 500 mcg by mouth daily.   fluconazole (DIFLUCAN) 150 MG tablet Take 1 tablet by mouth and repeat dose in one week (Patient not taking: Reported on 03/08/2023)   magic mouthwash (nystatin, lidocaine, diphenhydrAMINE, alum & mag hydroxide) suspension Swish and swallow 5 mLs 3 (three) times daily. (Patient not taking: Reported on 03/08/2023)   pravastatin (PRAVACHOL) 20 MG tablet TAKE 1 TABLET BY MOUTH ONCE DAILY   [DISCONTINUED] linaclotide (LINZESS) 145 MCG CAPS capsule Take 1 capsule (145 mcg total) by mouth daily before breakfast.  Facility-Administered Encounter Medications as of 03/08/2023  Medication   0.9 %  sodium chloride infusion     Patient Active Problem List   Diagnosis Date Noted   Depression, major, single episode, complete remission (HCC) 03/28/2021   Mixed hyperlipidemia 03/28/2021   Primary insomnia 03/28/2021   Depression, major, single episode, moderate (HCC) 03/28/2021   Chronic idiopathic constipation 03/28/2021   Seasonal allergies 01/13/2018   Asthma 02/05/2017   Allergy  12/16/2016   Age related osteoporosis 08/19/2016   Essential hypertension 08/19/2016   Human immunodeficiency virus (HIV) disease (HCC) 08/19/2016   Hx of adenomatous polyp of colon 2015     Health Maintenance Due  Topic Date Due   COVID-19 Vaccine (5 - 2023-24 season) 05/15/2022     Review of Systems  Constitutional: Negative for fever, chills, diaphoresis, activity change, appetite change, fatigue and unexpected weight change.  HENT: Negative for congestion, sore throat, rhinorrhea, sneezing, trouble swallowing and sinus pressure.  Eyes: Negative for photophobia and visual disturbance.  Respiratory: Negative for cough, chest tightness, shortness of breath, wheezing and stridor.  Cardiovascular: Negative for chest pain, palpitations and leg swelling.  Gastrointestinal: Negative for nausea, vomiting, abdominal pain, diarrhea, constipation, blood in stool, abdominal distention and anal bleeding.  Genitourinary: Negative for dysuria, hematuria, flank pain and difficulty urinating.  Musculoskeletal: Negative for myalgias, back pain, joint swelling, arthralgias and gait problem.  Skin: Negative for color change, pallor, rash and wound.  Neurological: Negative for dizziness, tremors, weakness and light-headedness.  Hematological: Negative for adenopathy. Does not bruise/bleed easily.  Psychiatric/Behavioral: Negative for behavioral problems, confusion, sleep disturbance, dysphoric mood, decreased concentration and agitation.   Physical Exam   BP 122/81   Pulse 67   Resp 16   Ht 5\' 7"  (1.702 m)   Wt 176 lb (79.8 kg)   SpO2 98%   BMI 27.57 kg/m   Physical Exam  Constitutional:  oriented to person, place, and time. appears well-developed and well-nourished. No distress.  HENT: [place]/AT, PERRLA, no scleral icterus Mouth/Throat: Oropharynx is clear and moist. No oropharyngeal exudate.  Cardiovascular: Normal rate, regular rhythm and normal heart sounds. Exam reveals no gallop and no  friction rub.  No murmur heard.  Pulmonary/Chest: Effort normal and breath sounds normal. No respiratory distress.  has no wheezes.  Neck = supple, no nuchal rigidity Abdominal: Soft. Bowel sounds are normal.  exhibits no distension. There is no tenderness.  Lymphadenopathy: no cervical adenopathy. No axillary adenopathy Neurological: alert and oriented to person, place, and time.  Skin: Skin is warm and dry. No rash noted. No erythema.  Psychiatric: a normal mood and affect.  behavior is normal.   Lab Results  Component Value Date   CD4TCELL 13 (L) 03/16/2022   Lab Results  Component Value Date   CD4TABS 620 08/25/2021   CD4TABS 475 01/28/2021   CD4TABS 526 07/17/2020   Lab Results  Component Value Date   HIV1RNAQUANT Not Detected 09/21/2022   Lab Results  Component Value Date   HEPBSAB POS (A) 09/24/2016   Lab Results  Component Value Date   LABRPR NON-REACTIVE 09/21/2022    CBC Lab Results  Component Value Date   WBC 9.4 02/09/2023   RBC 4.65 02/09/2023   HGB 14.2 02/09/2023   HCT 42.3 02/09/2023   PLT 237 02/09/2023   MCV 91.0 02/09/2023   MCH 30.5 02/09/2023   MCHC 33.6 02/09/2023   RDW 13.2 02/09/2023   LYMPHSABS 4,004 (H) 02/09/2023   MONOABS 0.2 02/03/2020   EOSABS  216 02/09/2023    BMET Lab Results  Component Value Date   NA 143 09/21/2022   K 3.8 09/21/2022   CL 102 09/21/2022   CO2 32 09/21/2022   GLUCOSE 86 09/21/2022   BUN 14 09/21/2022   CREATININE 0.97 09/21/2022   CALCIUM 9.8 09/21/2022   GFRNONAA 64 01/28/2021   GFRAA 75 01/28/2021      Assessment and Plan  HIV disease= will check 6 month labs. Continue to give refills on biktarvy  Long term medication management = will check that cr is stable   Htn = well controlled , continue on current regimen   Cheliosis = will do magic mouth wash to see if helps symptoms. Continue with supportive care

## 2023-03-09 ENCOUNTER — Other Ambulatory Visit (HOSPITAL_BASED_OUTPATIENT_CLINIC_OR_DEPARTMENT_OTHER): Payer: Self-pay

## 2023-03-10 ENCOUNTER — Other Ambulatory Visit (HOSPITAL_COMMUNITY): Payer: Self-pay

## 2023-03-10 ENCOUNTER — Other Ambulatory Visit (HOSPITAL_BASED_OUTPATIENT_CLINIC_OR_DEPARTMENT_OTHER): Payer: Self-pay

## 2023-03-10 ENCOUNTER — Telehealth: Payer: Self-pay

## 2023-03-10 MED ORDER — ENALAPRIL MALEATE 20 MG PO TABS
20.0000 mg | ORAL_TABLET | Freq: Every day | ORAL | 11 refills | Status: DC
  Filled 2023-03-10: qty 30, 30d supply, fill #0
  Filled 2023-04-06: qty 30, 30d supply, fill #1
  Filled 2023-05-10: qty 30, 30d supply, fill #2
  Filled 2023-06-05: qty 30, 30d supply, fill #3
  Filled 2023-07-08: qty 30, 30d supply, fill #4
  Filled 2023-08-05: qty 30, 30d supply, fill #5
  Filled 2023-08-29: qty 30, 30d supply, fill #6
  Filled 2023-10-03: qty 30, 30d supply, fill #7
  Filled 2023-11-02: qty 30, 30d supply, fill #8
  Filled 2023-12-03: qty 30, 30d supply, fill #9
  Filled 2023-12-29: qty 30, 30d supply, fill #10
  Filled 2024-01-30: qty 30, 30d supply, fill #11

## 2023-03-10 MED ORDER — HYDROCHLOROTHIAZIDE 25 MG PO TABS
25.0000 mg | ORAL_TABLET | Freq: Every day | ORAL | 11 refills | Status: DC
  Filled 2023-03-10: qty 30, 30d supply, fill #0
  Filled 2023-04-06: qty 30, 30d supply, fill #1
  Filled 2023-05-10: qty 30, 30d supply, fill #2
  Filled 2023-06-06: qty 30, 30d supply, fill #3
  Filled 2023-07-08: qty 30, 30d supply, fill #4
  Filled 2023-08-05: qty 30, 30d supply, fill #5
  Filled 2023-08-29: qty 30, 30d supply, fill #6
  Filled 2023-10-03: qty 30, 30d supply, fill #7
  Filled 2023-11-08: qty 30, 30d supply, fill #8
  Filled 2023-12-06: qty 30, 30d supply, fill #9
  Filled 2024-01-08: qty 30, 30d supply, fill #10
  Filled 2024-02-03: qty 30, 30d supply, fill #11

## 2023-03-10 NOTE — Telephone Encounter (Signed)
Patient call this afternoon because she was picking up her medication and it was denied because it was sent to the wrong pharmacy. It was resent to the pharmacy with the correct provider.

## 2023-03-12 ENCOUNTER — Other Ambulatory Visit (HOSPITAL_COMMUNITY): Payer: Self-pay

## 2023-03-22 ENCOUNTER — Ambulatory Visit: Payer: PPO | Admitting: Internal Medicine

## 2023-03-23 ENCOUNTER — Telehealth: Payer: Self-pay | Admitting: Pharmacist

## 2023-03-23 DIAGNOSIS — J4550 Severe persistent asthma, uncomplicated: Secondary | ICD-10-CM

## 2023-03-23 MED ORDER — DUPIXENT 300 MG/2ML ~~LOC~~ SOAJ
300.0000 mg | SUBCUTANEOUS | 0 refills | Status: DC
Start: 2023-03-23 — End: 2023-05-18

## 2023-03-23 NOTE — Telephone Encounter (Signed)
Refill sent for DUPIXENT to [facility]: [phone]  Dose: 300 mg every 2 weeks  Last OV: 06/25/2022 Provider: Dr/ Celine Mans  Next OV: overdue in April 2024. Routing to scheduling team  Chesley Mires, PharmD, MPH, BCPS Clinical Pharmacist (Rheumatology and Pulmonology)

## 2023-04-06 ENCOUNTER — Other Ambulatory Visit (HOSPITAL_BASED_OUTPATIENT_CLINIC_OR_DEPARTMENT_OTHER): Payer: Self-pay

## 2023-04-06 ENCOUNTER — Other Ambulatory Visit (HOSPITAL_COMMUNITY): Payer: Self-pay

## 2023-04-06 ENCOUNTER — Other Ambulatory Visit: Payer: Self-pay | Admitting: Nurse Practitioner

## 2023-04-06 MED ORDER — PRAVASTATIN SODIUM 20 MG PO TABS
20.0000 mg | ORAL_TABLET | Freq: Every day | ORAL | 1 refills | Status: DC
  Filled 2023-04-06: qty 90, 90d supply, fill #0
  Filled 2023-07-22: qty 90, 90d supply, fill #1

## 2023-04-09 ENCOUNTER — Other Ambulatory Visit: Payer: Self-pay

## 2023-04-19 ENCOUNTER — Other Ambulatory Visit (HOSPITAL_BASED_OUTPATIENT_CLINIC_OR_DEPARTMENT_OTHER): Payer: Self-pay

## 2023-04-19 ENCOUNTER — Ambulatory Visit (INDEPENDENT_AMBULATORY_CARE_PROVIDER_SITE_OTHER): Payer: 59 | Admitting: Family

## 2023-04-19 ENCOUNTER — Encounter: Payer: Self-pay | Admitting: Family

## 2023-04-19 VITALS — BP 120/87 | HR 66 | Temp 97.0°F | Resp 18 | Ht 67.0 in | Wt 171.0 lb

## 2023-04-19 DIAGNOSIS — E782 Mixed hyperlipidemia: Secondary | ICD-10-CM | POA: Diagnosis not present

## 2023-04-19 DIAGNOSIS — R7301 Impaired fasting glucose: Secondary | ICD-10-CM | POA: Diagnosis not present

## 2023-04-19 DIAGNOSIS — F321 Major depressive disorder, single episode, moderate: Secondary | ICD-10-CM | POA: Diagnosis not present

## 2023-04-19 DIAGNOSIS — Z0000 Encounter for general adult medical examination without abnormal findings: Secondary | ICD-10-CM | POA: Diagnosis not present

## 2023-04-19 DIAGNOSIS — I10 Essential (primary) hypertension: Secondary | ICD-10-CM | POA: Diagnosis not present

## 2023-04-19 DIAGNOSIS — J4540 Moderate persistent asthma, uncomplicated: Secondary | ICD-10-CM | POA: Diagnosis not present

## 2023-04-19 DIAGNOSIS — J4550 Severe persistent asthma, uncomplicated: Secondary | ICD-10-CM | POA: Diagnosis not present

## 2023-04-19 LAB — CBC WITH DIFFERENTIAL/PLATELET
Absolute Monocytes: 577 cells/uL (ref 200–950)
Basophils Absolute: 62 cells/uL (ref 0–200)
Basophils Relative: 1 %
Eosinophils Absolute: 211 cells/uL (ref 15–500)
Eosinophils Relative: 3.4 %
HCT: 40.5 % (ref 35.0–45.0)
Hemoglobin: 13.7 g/dL (ref 11.7–15.5)
Lymphs Abs: 3410 cells/uL (ref 850–3900)
MCH: 30.8 pg (ref 27.0–33.0)
MCHC: 33.8 g/dL (ref 32.0–36.0)
MCV: 91 fL (ref 80.0–100.0)
MPV: 10.3 fL (ref 7.5–12.5)
Monocytes Relative: 9.3 %
Neutro Abs: 1941 cells/uL (ref 1500–7800)
Neutrophils Relative %: 31.3 %
Platelets: 254 10*3/uL (ref 140–400)
RBC: 4.45 10*6/uL (ref 3.80–5.10)
RDW: 13.1 % (ref 11.0–15.0)
Total Lymphocyte: 55 %
WBC: 6.2 10*3/uL (ref 3.8–10.8)

## 2023-04-19 MED ORDER — MONTELUKAST SODIUM 10 MG PO TABS
10.0000 mg | ORAL_TABLET | Freq: Every day | ORAL | 1 refills | Status: DC
  Filled 2023-04-19: qty 90, 90d supply, fill #0

## 2023-04-19 NOTE — Progress Notes (Signed)
Provider: Richarda Blade FNP-C   , Donalee Citrin, NP  Patient Care Team: , Donalee Citrin, NP as PCP - General (Family Medicine)  Extended Emergency Contact Information Primary Emergency Contact: [facility] Address: [address]          [place], [place] [address] Darden [place] of [place] Home Phone: [phone] Relation: Spouse Secondary Emergency Contact: Hanning,Rushdan Address: [address]          [place], [place] [address] D[place] of [place] Home Phone: [phone] Mobile Phone: [phone] Relation: Son  Code Status:  Full Code  Goals of care: Advanced Directive information    04/19/2023    8:40 AM  Advanced Directives  Does Patient Have a Medical Advance Directive? No  Would patient like information on creating a medical advance directive? No - Patient declined     Chief Complaint  Patient presents with   Follow-up    Six month follow-up   Quality Metric Gaps    Needs to discuss Covid and Flu vaccine.    HPI:  Pt is a 72 y.o. female seen today for 6 months follow up for  medical management of chronic diseases. States recovering from a cold.No fever or chills.     Hypertension - B/p has been normal.she denies any headache,dizziness,vision changes,fatigue,chest tightness,palpitation,chest pain or shortness of breath.    Depression - felt more when she was sick could not get out of the house to go to church.Feeling much better.   Hyperlipidemia - does not do any form of exercise.Has a gym in the house but does not use it.used to like to dance.   Asthma - States has had no issues.Not using any inhaler.Not using Dupixent for the past 4 months.    Continue to follow up with infectious disease.states previous sores in the mouth has cleared up. Has upcoming appointment 04/26/2023.    Past Medical History:  Diagnosis Date   Allergy    seasonal allergies   Asthma    uses unhaler   Blood transfusion without reported diagnosis 1990's   Cataract     bilateral sx   Depression    on meds   High blood pressure    on meds   HIV (human immunodeficiency virus infection) (HCC)    on meds   Hx of adenomatous polyp of colon 2015   Hyperlipidemia    on meds   Osteoarthritis    Osteopenia    Past Surgical History:  Procedure Laterality Date   Biopsy of Liver     CATARACT EXTRACTION, BILATERAL     CHOLECYSTECTOMY     COLONOSCOPY  2015   MAC-Dr.Liakos-prep(exc)-TA x 1   CYST REMOVAL HAND Left    CYSTECTOMY     off of back   LAPAROSCOPIC SALPINGOOPHERECTOMY Right    POLYPECTOMY  2015   TA    No Known Allergies  Allergies as of 04/19/2023   No Known Allergies      Medication List        Accurate as of April 19, 2023  9:32 AM. If you have any questions, ask your nurse or doctor.          STOP taking these medications    Arexvy 120 MCG/0.5ML injection Generic drug: RSV vaccine recomb adjuvanted Stopped by:  C    magic mouthwash (nystatin, lidocaine, diphenhydrAMINE, alum & mag hydroxide) suspension Stopped by: Donalee Citrin    valACYclovir 1000 MG tablet Commonly known as: Valtrex Stopped by: Donalee Citrin   TAKE these medications    Advair HFA 115-21 MCG/ACT inhaler Generic drug: fluticasone-salmeterol Inhale 1 puff into the lungs 2 (two) times daily.   albuterol (2.5 MG/3ML) 0.083% nebulizer solution Commonly known as: PROVENTIL Take 3 mLs (2.5 mg total) by nebulization every 4 (four) hours as needed for wheezing or shortness of breath.   albuterol 108 (90 Base) MCG/ACT inhaler Commonly known as: VENTOLIN HFA INHALE 2 PUFFS BY MOUTH INTO THE LUNGS EVERY 4 HOURS AS NEEDED   Allergy (Cetirizine) 10 MG tablet Generic drug: cetirizine TAKE 1 TABLET (10 MG TOTAL) BY MOUTH DAILY.   ascorbic acid 500 MG tablet Commonly known as: VITAMIN C Take by mouth.   Biktarvy 50-200-25 MG Tabs tablet Generic drug: bictegravir-emtricitabine-tenofovir AF TAKE 1 TABLET BY MOUTH DAILY.   Blood  Pressure Cuff Misc 1 Device by Does not apply route daily. DX: I10   CALCIUM 600 PO Take 1 tablet by mouth daily.   [employer] Use with advair HFA   cyanocobalamin 500 MCG tablet Commonly known as: VITAMIN B12 Take 500 mcg by mouth daily.   Dupixent 300 MG/2ML Sopn Generic drug: Dupilumab Inject 300 mg into the skin every 14 (fourteen) days.   ELDERBERRY PO Take by mouth.   enalapril 20 MG tablet Commonly known as: Vasotec Take 1 tablet (20 mg total) by mouth daily.   fluconazole 150 MG tablet Commonly known as: Diflucan Take 1 tablet by mouth and repeat dose in one week   fluticasone 50 MCG/ACT nasal spray Commonly known as: FLONASE Place 2 sprays into both nostrils daily.   HAIR/SKIN/NAILS PO Take by mouth.   hydrochlorothiazide 25 MG tablet Commonly known as: HYDRODIURIL Take 1 tablet (25 mg total) by mouth daily.   metoprolol tartrate 100 MG tablet Commonly known as: LOPRESSOR Take 1 tablet (100 mg total) by mouth daily.   montelukast 10 MG tablet Commonly known as: SINGULAIR TAKE 1 TABLET (10 MG TOTAL) BY MOUTH AT BEDTIME.   MULTIVITAMIN GUMMIES ADULT PO Take 2 tablets by mouth daily. Gummy   pravastatin 20 MG tablet Commonly known as: PRAVACHOL Take 1 tablet (20 mg total) by mouth daily.   sertraline 25 MG tablet Commonly known as: ZOLOFT Take 1 tablet (25 mg total) by mouth daily.   Spikevax syringe Generic drug: COVID-19 mRNA vaccine 2023-2024 Inject 0.5 mLs into the muscle once.   traZODone 50 MG tablet Commonly known as: DESYREL Take 1/2-1 tablet (25-50 mg total) by mouth at bedtime as needed for sleep.   Vitamin D 125 MCG (5000 UT) Caps Take by mouth.   ZINC PO Take by mouth.        Review of Systems  Constitutional:  Negative for appetite change, chills, fatigue, fever and unexpected weight change.  HENT:  Negative for congestion, dental problem, ear discharge, ear pain, facial swelling, hearing loss,  nosebleeds, postnasal drip, rhinorrhea, sinus pressure, sinus pain, sneezing, sore throat, tinnitus and trouble swallowing.   Eyes:  Negative for pain, discharge, redness, itching and visual disturbance.  Respiratory:  Negative for cough, chest tightness, shortness of breath and wheezing.   Cardiovascular:  Negative for chest pain, palpitations and leg swelling.  Gastrointestinal:  Negative for abdominal distention, abdominal pain, blood in stool, constipation, diarrhea, nausea and vomiting.  Endocrine: Negative for cold intolerance, heat intolerance, polydipsia, polyphagia and polyuria.  Genitourinary:  Negative for difficulty urinating, dysuria, flank pain, frequency and urgency.  Musculoskeletal:  Negative for arthralgias, back pain, gait problem, joint swelling, myalgias, neck pain  and neck stiffness.  Skin:  Negative for color change, pallor, rash and wound.  Neurological:  Negative for dizziness, syncope, speech difficulty, weakness, light-headedness, numbness and headaches.  Hematological:  Does not bruise/bleed easily.  Psychiatric/Behavioral:  Negative for agitation, behavioral problems, confusion, hallucinations, self-injury, sleep disturbance and suicidal ideas. The patient is not nervous/anxious.     Immunization History  Administered Date(s) Administered   Fluad Quad(high Dose 65+) 05/16/2019, 05/31/2020, 06/06/2021, 06/16/2022   Influenza,inj,Quad PF,6+ Mos 06/17/2017, 06/09/2018   Influenza-Unspecified 06/11/2016   Moderna Covid-19 Vaccine Bivalent Booster 47yrs & up 07/12/2021   Moderna Sars-Covid-2 Vaccination 07/06/2020   PFIZER(Purple Top)SARS-COV-2 Vaccination 10/20/2019, 11/14/2019   Pneumococcal Conjugate-13 05/20/2018   Pneumococcal Polysaccharide-23 02/15/2017   Respiratory Syncytial Virus Vaccine,Recomb Aduvanted(Arexvy) 09/21/2022   Tdap 09/21/2017   Zoster Recombinant(Shingrix) 07/19/2018, 02/13/2019   Pertinent  Health Maintenance Due  Topic Date Due    INFLUENZA VACCINE  04/15/2023   MAMMOGRAM  07/01/2024   Colonoscopy  12/17/2027   DEXA SCAN  Completed      10/06/2022    1:40 PM 10/15/2022    9:45 AM 02/12/2023   12:20 PM 03/08/2023    8:50 AM 04/19/2023    9:30 AM  Fall Risk  Falls in the past year? 0 0 0 0 0  Was there an injury with Fall? 0 0 0 0 0  Fall Risk Category Calculator 0 0 0 0 0  Patient at Risk for Falls Due to No Fall Risks No Fall Risks   No Fall Risks  Fall risk Follow up Falls evaluation completed Falls evaluation completed Falls evaluation completed  Falls evaluation completed   Functional Status Survey:    Vitals:   04/19/23 0839  BP: 120/87  Pulse: 66  Resp: 18  Temp: (!) 97 F (36.1 C)  SpO2: 98%  Weight: 171 lb (77.6 kg)  Height: 5\' 7"  (1.702 m)   Body mass index is 26.78 kg/m. Physical Exam Vitals reviewed.  Constitutional:      General: She is not in acute distress.    Appearance: Normal appearance. She is overweight. She is not ill-appearing or diaphoretic.  HENT:     Head: Normocephalic.     Right Ear: Tympanic membrane, ear canal and external ear normal. There is no impacted cerumen.     Left Ear: Tympanic membrane, ear canal and external ear normal. There is no impacted cerumen.     Nose: Nose normal. No congestion or rhinorrhea.     Mouth/Throat:     Mouth: Mucous membranes are moist.     Pharynx: Oropharynx is clear. No oropharyngeal exudate or posterior oropharyngeal erythema.  Eyes:     General: No scleral icterus.       Right eye: No discharge.        Left eye: No discharge.     Extraocular Movements: Extraocular movements intact.     Conjunctiva/sclera: Conjunctivae normal.     Pupils: Pupils are equal, round, and reactive to light.  Neck:     Vascular: No carotid bruit.  Cardiovascular:     Rate and Rhythm: Normal rate and regular rhythm.     Pulses: Normal pulses.     Heart sounds: Normal heart sounds. No murmur heard.    No friction rub. No gallop.  Pulmonary:      Effort: Pulmonary effort is normal. No respiratory distress.     Breath sounds: Normal breath sounds. No wheezing, rhonchi or rales.  Chest:     Chest wall: No  tenderness.  Abdominal:     General: Bowel sounds are normal. There is no distension.     Palpations: Abdomen is soft. There is no mass.     Tenderness: There is no abdominal tenderness. There is no right CVA tenderness, left CVA tenderness, guarding or rebound.  Musculoskeletal:        General: No swelling or tenderness. Normal range of motion.     Cervical back: Normal range of motion. No rigidity or tenderness.     Right lower leg: No edema.     Left lower leg: No edema.  Lymphadenopathy:     Cervical: No cervical adenopathy.  Skin:    General: Skin is warm and dry.     Coloration: Skin is not pale.     Findings: No bruising, erythema, lesion or rash.  Neurological:     Mental Status: She is alert and oriented to person, place, and time.     Cranial Nerves: No cranial nerve deficit.     Sensory: No sensory deficit.     Motor: No weakness.     Coordination: Coordination normal.     Gait: Gait normal.  Psychiatric:        Mood and Affect: Mood normal.        Speech: Speech normal.        Behavior: Behavior normal.        Thought Content: Thought content normal.        Judgment: Judgment normal.     Labs reviewed: Recent Labs    09/21/22 0917  NA 143  K 3.8  CL 102  CO2 32  GLUCOSE 86  BUN 14  CREATININE 0.97  CALCIUM 9.8   Recent Labs    09/21/22 0917  AST 18  ALT 16  BILITOT 0.5  PROT 6.7   Recent Labs    09/21/22 0912 09/21/22 0917 02/09/23 1518  WBC 8.6 8.6 9.4  NEUTROABS 3.2 3,139 4,296  HGB 13.6 13.5 14.2  HCT 41.1 39.0 42.3  MCV 94 89.7 91.0  PLT 285 266 237   Lab Results  Component Value Date   TSH 1.43 08/16/2017   No results found for: "HGBA1C" Lab Results  Component Value Date   CHOL 159 09/21/2022   HDL 47 (L) 09/21/2022   LDLCALC 90 09/21/2022   TRIG 123 09/21/2022    CHOLHDL 3.4 09/21/2022    Significant Diagnostic Results in last 30 days:  No results found.  Assessment/Plan 1. Essential hypertension Blood pressure well-controlled -Continue on enalapril, metoprolol and hydrochlorothiazide -Dietary modification and exercise advised - TSH - COMPLETE METABOLIC PANEL WITH GFR - CBC with Differential/Platelet  2. Mixed hyperlipidemia LDL at goal -Continue on pravastatin - Lipid panel  3. Moderate  persistent asthma, unspecified whether complicated Stable -Has stopped using inhalers -Continue on montelukast - COMPLETE METABOLIC PANEL WITH GFR  4. Depression, major, single episode, moderate (HCC) Mood stable -Continue on sertraline - TSH  Family/ staff Communication: Reviewed plan of care with patient verbalized understanding  Labs/tests ordered:  - TSH - COMPLETE METABOLIC PANEL WITH GFR - CBC with Differential/Platelet - Lipid panel  Next Appointment : Return in about 6 months (around 10/20/2023) for medical mangement of chronic issues.Caesar Bookman, NP

## 2023-04-21 NOTE — Telephone Encounter (Signed)
error 

## 2023-04-22 ENCOUNTER — Other Ambulatory Visit: Payer: Self-pay

## 2023-04-22 DIAGNOSIS — E876 Hypokalemia: Secondary | ICD-10-CM

## 2023-04-26 ENCOUNTER — Other Ambulatory Visit (HOSPITAL_BASED_OUTPATIENT_CLINIC_OR_DEPARTMENT_OTHER): Payer: Self-pay

## 2023-04-26 ENCOUNTER — Encounter: Payer: Self-pay | Admitting: Internal Medicine

## 2023-04-26 ENCOUNTER — Other Ambulatory Visit: Payer: Self-pay

## 2023-04-26 ENCOUNTER — Ambulatory Visit (INDEPENDENT_AMBULATORY_CARE_PROVIDER_SITE_OTHER): Payer: 59 | Admitting: Internal Medicine

## 2023-04-26 VITALS — BP 125/76 | HR 75 | Temp 98.1°F | Ht 67.0 in | Wt 176.0 lb

## 2023-04-26 DIAGNOSIS — I10 Essential (primary) hypertension: Secondary | ICD-10-CM

## 2023-04-26 DIAGNOSIS — Z79899 Other long term (current) drug therapy: Secondary | ICD-10-CM

## 2023-04-26 DIAGNOSIS — B20 Human immunodeficiency virus [HIV] disease: Secondary | ICD-10-CM

## 2023-04-26 NOTE — Patient Instructions (Signed)
Can take mucinex to help with thinning out nasal discharge/mucinex

## 2023-04-26 NOTE — Progress Notes (Signed)
RFV: hiv disease  [name], female   DOB: 1951-09-10, 72 y.o.   MRN: [id number]  HPI Maria Adkins -is a 72yo F with well controlled hiv disease, htn. She continues to take her medications daily. She reports that she has Had a head cold, nasal congestion but improving. No fever, chils, cough.  Outpatient Encounter Medications as of 04/26/2023  Medication Sig   albuterol (PROVENTIL) (2.5 MG/3ML) 0.083% nebulizer solution Take 3 mLs (2.5 mg total) by nebulization every 4 (four) hours as needed for wheezing or shortness of breath.   albuterol (VENTOLIN HFA) 108 (90 Base) MCG/ACT inhaler INHALE 2 PUFFS BY MOUTH INTO THE LUNGS EVERY 4 HOURS AS NEEDED   ascorbic acid (VITAMIN C) 500 MG tablet Take by mouth.   bictegravir-emtricitabine-tenofovir AF (BIKTARVY) 50-200-25 MG TABS tablet TAKE 1 TABLET BY MOUTH DAILY.   Biotin w/ Vitamins C & E (HAIR/SKIN/NAILS PO) Take by mouth.   Blood Pressure Monitoring (BLOOD PRESSURE CUFF) MISC 1 Device by Does not apply route daily. DX: I10   Calcium Carbonate (CALCIUM 600 PO) Take 1 tablet by mouth daily.    Cholecalciferol (VITAMIN D) 125 MCG (5000 UT) CAPS Take by mouth.   Dupilumab (DUPIXENT) 300 MG/2ML SOPN Inject 300 mg into the skin every 14 (fourteen) days.   ELDERBERRY PO Take by mouth.   enalapril (VASOTEC) 20 MG tablet Take 1 tablet (20 mg total) by mouth daily.   fluconazole (DIFLUCAN) 150 MG tablet Take 1 tablet by mouth and repeat dose in one week   fluticasone (FLONASE) 50 MCG/ACT nasal spray Place 2 sprays into both nostrils daily.   fluticasone-salmeterol (ADVAIR HFA) 115-21 MCG/ACT inhaler Inhale 1 puff into the lungs 2 (two) times daily.   hydrochlorothiazide (HYDRODIURIL) 25 MG tablet Take 1 tablet (25 mg total) by mouth daily.   metoprolol tartrate (LOPRESSOR) 100 MG tablet Take 1 tablet (100 mg total) by mouth daily.   montelukast (SINGULAIR) 10 MG tablet Take 1 tablet (10 mg total) by mouth at bedtime.   Multiple  Vitamins-Minerals (MULTIVITAMIN GUMMIES ADULT PO) Take 2 tablets by mouth daily. Gummy   Multiple Vitamins-Minerals (ZINC PO) Take by mouth.   pravastatin (PRAVACHOL) 20 MG tablet Take 1 tablet (20 mg total) by mouth daily.   sertraline (ZOLOFT) 25 MG tablet Take 1 tablet (25 mg total) by mouth daily.   Spacer/Aero-Holding Chambers DEVI Use with advair HFA   SPIKEVAX syringe Inject 0.5 mLs into the muscle once.   traZODone (DESYREL) 50 MG tablet Take 1/2-1 tablet (25-50 mg total) by mouth at bedtime as needed for sleep.   vitamin B-12 (CYANOCOBALAMIN) 500 MCG tablet Take 500 mcg by mouth daily.   [DISCONTINUED] linaclotide (LINZESS) 145 MCG CAPS capsule Take 1 capsule (145 mcg total) by mouth daily before breakfast.   Facility-Administered Encounter Medications as of 04/26/2023  Medication   0.9 %  sodium chloride infusion     Patient Active Problem List   Diagnosis Date Noted   Depression, major, single episode, complete remission (HCC) 03/28/2021   Mixed hyperlipidemia 03/28/2021   Primary insomnia 03/28/2021   Depression, major, single episode, moderate (HCC) 03/28/2021   Chronic idiopathic constipation 03/28/2021   Seasonal allergies 01/13/2018   Asthma 02/05/2017   Allergy 12/16/2016   Age related osteoporosis 08/19/2016   Essential hypertension 08/19/2016   Human immunodeficiency virus (HIV) disease (HCC) 08/19/2016   Hx of adenomatous polyp of colon 2015     Health Maintenance Due  Topic Date Due   COVID-19 Vaccine (  5 - 2023-24 season) 05/15/2022   INFLUENZA VACCINE  04/15/2023     Review of Systems  Constitutional: Negative for fever, chills, diaphoresis, activity change, appetite change, fatigue and unexpected weight change.  HENT: Negative for congestion, sore throat, rhinorrhea, sneezing, trouble swallowing and sinus pressure.  Eyes: Negative for photophobia and visual disturbance.  Respiratory: Negative for cough, chest tightness, shortness of breath, wheezing  and stridor.  Cardiovascular: Negative for chest pain, palpitations and leg swelling.  Gastrointestinal: Negative for nausea, vomiting, abdominal pain, diarrhea, constipation, blood in stool, abdominal distention and anal bleeding.  Genitourinary: Negative for dysuria, hematuria, flank pain and difficulty urinating.  Musculoskeletal: Negative for myalgias, back pain, joint swelling, arthralgias and gait problem.  Skin: Negative for color change, pallor, rash and wound.  Neurological: Negative for dizziness, tremors, weakness and light-headedness.  Hematological: Negative for adenopathy. Does not bruise/bleed easily.  Psychiatric/Behavioral: Negative for behavioral problems, confusion, sleep disturbance, dysphoric mood, decreased concentration and agitation.   Physical Exam   BP 125/76   Pulse 75   Temp 98.1 F (36.7 C) (Temporal)   Ht 5\' 7"  (1.702 m)   Wt 176 lb (79.8 kg)   SpO2 94%   BMI 27.57 kg/m   Physical Exam  Constitutional:  oriented to person, place, and time. appears well-developed and well-nourished. No distress.  HENT: [place]/AT, PERRLA, no scleral icterus Mouth/Throat: Oropharynx is clear and moist. No oropharyngeal exudate.  Cardiovascular: Normal rate, regular rhythm and normal heart sounds. Exam reveals no gallop and no friction rub.  No murmur heard.  Pulmonary/Chest: Effort normal and breath sounds normal. No respiratory distress.  has no wheezes.  Neck = supple, no nuchal rigidity Abdominal: Soft. Bowel sounds are normal.  exhibits no distension. There is no tenderness.  Lymphadenopathy: no cervical adenopathy. No axillary adenopathy Neurological: alert and oriented to person, place, and time.  Skin: Skin is warm and dry. No rash noted. No erythema.  Psychiatric: a normal mood and affect.  behavior is normal.   Lab Results  Component Value Date   CD4TCELL 13 (L) 03/16/2022   Lab Results  Component Value Date   CD4TABS 620 08/25/2021   CD4TABS 475 01/28/2021    CD4TABS 526 07/17/2020   Lab Results  Component Value Date   HIV1RNAQUANT Not Detected 09/21/2022   Lab Results  Component Value Date   HEPBSAB POS (A) 09/24/2016   Lab Results  Component Value Date   LABRPR NON-REACTIVE 09/21/2022    CBC Lab Results  Component Value Date   WBC 6.2 04/19/2023   RBC 4.45 04/19/2023   HGB 13.7 04/19/2023   HCT 40.5 04/19/2023   PLT 254 04/19/2023   MCV 91.0 04/19/2023   MCH 30.8 04/19/2023   MCHC 33.8 04/19/2023   RDW 13.1 04/19/2023   LYMPHSABS 3,410 04/19/2023   MONOABS 0.2 02/03/2020   EOSABS 211 04/19/2023    BMET Lab Results  Component Value Date   NA 141 04/19/2023   K 3.3 (L) 04/19/2023   CL 99 04/19/2023   CO2 35 (H) 04/19/2023   GLUCOSE 106 (H) 04/19/2023   BUN 18 04/19/2023   CREATININE 0.96 04/19/2023   CALCIUM 10.2 04/19/2023   GFRNONAA 64 01/28/2021   GFRAA 75 01/28/2021      Assessment and Plan Nasal congestion = Will try a trial of mucinex. If symptoms persist, recommend to do covid testing. And may not necessary be seasonal allergies  Hiv disease= well controlled. Continue on biktarvy. Will give refills.  Long term medication  management = cr is stable.  Health maintenance = wearing a mask for covid illness uptick in the community  Htn = well controlled. Continue on current regimen.

## 2023-04-27 ENCOUNTER — Other Ambulatory Visit (HOSPITAL_BASED_OUTPATIENT_CLINIC_OR_DEPARTMENT_OTHER): Payer: Self-pay

## 2023-05-04 ENCOUNTER — Other Ambulatory Visit: Payer: PPO

## 2023-05-05 ENCOUNTER — Other Ambulatory Visit: Payer: PPO

## 2023-05-05 ENCOUNTER — Other Ambulatory Visit: Payer: Self-pay

## 2023-05-05 DIAGNOSIS — E876 Hypokalemia: Secondary | ICD-10-CM | POA: Diagnosis not present

## 2023-05-06 LAB — BASIC METABOLIC PANEL WITH GFR
BUN: 13 mg/dL (ref 7–25)
CO2: 31 mmol/L (ref 20–32)
Calcium: 9.6 mg/dL (ref 8.6–10.4)
Chloride: 101 mmol/L (ref 98–110)
Creat: 0.85 mg/dL (ref 0.60–1.00)
Glucose, Bld: 88 mg/dL (ref 65–99)
Potassium: 3.6 mmol/L (ref 3.5–5.3)
Sodium: 140 mmol/L (ref 135–146)
eGFR: 72 mL/min/{1.73_m2} (ref >=60)

## 2023-05-07 ENCOUNTER — Other Ambulatory Visit (HOSPITAL_COMMUNITY): Payer: Self-pay

## 2023-05-10 ENCOUNTER — Other Ambulatory Visit (HOSPITAL_BASED_OUTPATIENT_CLINIC_OR_DEPARTMENT_OTHER): Payer: Self-pay

## 2023-05-18 ENCOUNTER — Encounter: Payer: Self-pay | Admitting: Internal Medicine

## 2023-05-18 ENCOUNTER — Ambulatory Visit (INDEPENDENT_AMBULATORY_CARE_PROVIDER_SITE_OTHER): Payer: 59 | Admitting: Internal Medicine

## 2023-05-18 ENCOUNTER — Other Ambulatory Visit (HOSPITAL_BASED_OUTPATIENT_CLINIC_OR_DEPARTMENT_OTHER): Payer: Self-pay

## 2023-05-18 VITALS — BP 122/76 | HR 70 | Ht 67.0 in | Wt 170.6 lb

## 2023-05-18 DIAGNOSIS — R0981 Nasal congestion: Secondary | ICD-10-CM | POA: Diagnosis not present

## 2023-05-18 DIAGNOSIS — J301 Allergic rhinitis due to pollen: Secondary | ICD-10-CM

## 2023-05-18 DIAGNOSIS — J4540 Moderate persistent asthma, uncomplicated: Secondary | ICD-10-CM | POA: Diagnosis not present

## 2023-05-18 MED ORDER — MONTELUKAST SODIUM 10 MG PO TABS
10.0000 mg | ORAL_TABLET | Freq: Every day | ORAL | 3 refills | Status: DC
  Filled 2023-05-18 – 2023-07-22 (×2): qty 90, 90d supply, fill #0

## 2023-05-18 MED ORDER — FLUTICASONE-SALMETEROL 115-21 MCG/ACT IN AERO
1.0000 | INHALATION_SPRAY | Freq: Two times a day (BID) | RESPIRATORY_TRACT | 12 refills | Status: AC
  Filled 2023-05-18: qty 12, 30d supply, fill #0
  Filled 2023-07-22: qty 12, 30d supply, fill #1

## 2023-05-18 MED ORDER — FLUTICASONE PROPIONATE 50 MCG/ACT NA SUSP
1.0000 | Freq: Every day | NASAL | 11 refills | Status: AC
  Filled 2023-05-18: qty 16, 30d supply, fill #0
  Filled 2023-05-21 – 2023-07-22 (×2): qty 16, 60d supply, fill #0
  Filled 2024-02-21: qty 16, 60d supply, fill #1

## 2023-05-18 NOTE — Progress Notes (Signed)
Maria Adkins    [id number]    [birth year]  Primary Care Physician:Ngetich, Maria Citrin, NP Date of Appointment: 05/18/2023 Established Patient Visit  Chief complaint:   Chief Complaint  Patient presents with   Follow-up    No concerns pt states she has been doing well.      HPI: Maria Adkins is a 72 y.o. 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 On Dupilumab started 06/06/2020 for severe steroid dependent asthma.   Interval Updates: Here for follow up for Severe persistent asthma.   Stopped dupixent about 6 months ago due to having mouth sores. Had a viral infection and saw ID - treated with magic mouthwash and fluconazole.   Now back on advair 1 puff in the morning and night.   No rescue inhaler use.   She has been exercising well.   Current Regimen: low dose advair, singulair, flonase, cetirizine,  Asthma Triggers: stress, perfume, pollen, smoke, burnt foods cold weather Exacerbations in the last year: none since July 2021 when starting dupixent. History of hospitalization or intubation: yes many times - last time in 2020. Required intubation during a hospital stay more than ten years ago. Previously very poorly controlled asthma Hives: none Allergy Testing: as a child.  GERD: none Allergic Rhinitis: yes on flonase, cetirizine, singulair.  ACT:  Asthma Control Test ACT Total Score  05/18/2023  8:33 AM 24  08/14/2021  8:57 AM 24  11/07/2020  8:56 AM 25   Lives at home with husband and son's family including grandson. Helps out with his daily care. No pets at home. No passive smoke exposure.    I have reviewed the patient's family social and past medical history and updated as appropriate.   Past Medical History:  Diagnosis Date   Allergy    seasonal allergies   Asthma    uses unhaler   Blood transfusion without reported diagnosis 1990's   Cataract    bilateral sx   Depression    on  meds   High blood pressure    on meds   HIV (human immunodeficiency virus infection) (HCC)    on meds   Hx of adenomatous polyp of colon 2015   Hyperlipidemia    on meds   Osteoarthritis    Osteopenia     Past Surgical History:  Procedure Laterality Date   Biopsy of Liver     CATARACT EXTRACTION, BILATERAL     CHOLECYSTECTOMY     COLONOSCOPY  2015   MAC-Dr.Liakos-prep(exc)-TA x 1   CYST REMOVAL HAND Left    CYSTECTOMY     off of back   LAPAROSCOPIC SALPINGOOPHERECTOMY Right    POLYPECTOMY  2015   TA    Family History  Problem Relation Age of Onset   Diabetes Mother        died at age 72   Hypertension Mother    Asthma Sister    Diabetes Sister    Hypertension Sister    Arthritis Sister    Diabetes Sister    Hypertension Sister    Asthma Sister    Arthritis Sister    Heart defect Sister    Asthma Son        controlled   Crohn's disease Niece    Colon polyps Neg Hx    Colon cancer Neg Hx    Esophageal cancer Neg Hx    Stomach cancer Neg Hx  Social History   Occupational History   Not on file  Tobacco Use   Smoking status: Former    Current packs/day: 0.00    Average packs/day: 1 pack/day for 14.0 years (14.0 ttl pk-yrs)    Types: Cigarettes    Start date: 09/14/1964    Quit date: 09/14/1978    Years since quitting: 44.7   Smokeless tobacco: Never  Vaping Use   Vaping status: Never Used  Substance and Sexual Activity   Alcohol use: No   Drug use: No   Sexual activity: Yes    Partners: Male    Birth control/protection: Condom    Comment: declined condoms     Physical Exam: Blood pressure 122/76, pulse 70, height 5\' 7"  (1.702 m), weight 170 lb 9.6 oz (77.4 kg), SpO2 98%.  Gen:      NAD HEENT:   no thrush, mild cobblestoning, mallampati IV Lungs:    ctab no wheezes or crackles CV:         RRR no mrg no edema   Data Reviewed: Imaging: I have personally reviewed the chest xray June 2021 - mild hyperinflation.   PFTs:      Latest Ref  Rng & Units 04/22/2020    9:37 AM  PFT Results  FVC-Pre L 2.48   FVC-Predicted Pre % 88   FVC-Post L 2.55   FVC-Predicted Post % 91   Pre FEV1/FVC % % 61   Post FEV1/FCV % % 71   FEV1-Pre L 1.52   FEV1-Predicted Pre % 69   FEV1-Post L 1.81   DLCO uncorrected ml/min/mmHg 19.68   DLCO UNC% % 88   DLCO corrected ml/min/mmHg 19.68   DLCO COR %Predicted % 88   DLVA Predicted % 106   TLC L 4.78   TLC % Predicted % 84   RV % Predicted % 93    I have personally reviewed the patient's PFTs and show mild airflow limitation with +BD response.   Labs: Elevated eosinophils  Noted on prior CBCs while on prednisone.   Immunization status: Immunization History  Administered Date(s) Administered   Fluad Quad(high Dose 65+) 05/16/2019, 05/31/2020, 06/06/2021, 06/16/2022   Influenza,inj,Quad PF,6+ Mos 06/17/2017, 06/09/2018   Influenza-Unspecified 06/11/2016   Moderna Covid-19 Vaccine Bivalent Booster 80yrs & up 07/12/2021   Moderna Sars-Covid-2 Vaccination 07/06/2020   PFIZER(Purple Top)SARS-COV-2 Vaccination 10/20/2019, 11/14/2019   Pneumococcal Conjugate-13 05/20/2018   Pneumococcal Polysaccharide-23 02/15/2017   Respiratory Syncytial Virus Vaccine,Recomb Aduvanted(Arexvy) 09/21/2022   Tdap 09/21/2017   Zoster Recombinant(Shingrix) 07/19/2018, 02/13/2019    External Notes reviewed: PCP and general surgery  Assessment:  Severe persistent eosinophilic asthma, well controlled Allergic Rhinitis, well controlled   Plan/Recommendations: Continue advair  continue treatment for allergic rhinitis with singulair and cetirizine.   Return to Care: No follow-ups on file.   Durel Salts, MD Pulmonary and Critical Care Medicine [facility] Office:[phone]

## 2023-05-18 NOTE — Progress Notes (Signed)
Maria Adkins    [id number]    1951/03/16  Primary Care Physician:Ngetich, Donalee Citrin, NP Date of Appointment: 05/18/2023 Established Patient Visit  Chief complaint:   Chief Complaint  Patient presents with   Follow-up    No concerns pt states she has been doing well.      HPI: Maria Adkins is a 72 y.o. 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 On Dupilumab started 06/06/2020 for severe steroid dependent asthma.   Interval Updates: Here for follow up for Severe persistent asthma.  No thrush on advair.  Allergies well controlled. No issues with singulair and zyrtec No rescue inhaler use.   Current Regimen: low dose advair, singulair, flonase, cetirizine, dupilumab Asthma Triggers: stress, perfume, pollen, smoke, burnt foods cold weather Exacerbations in the last year: none since July 2021 when starting dupixent. History of hospitalization or intubation: yes many times - last time in 2020. Required intubation during a hospital stay more than ten years ago. Previously very poorly controlled asthma Hives: none Allergy Testing: as a child.  GERD: none Allergic Rhinitis: yes on flonase, cetirizine, singulair.  ACT:  Asthma Control Test ACT Total Score  05/18/2023  8:33 AM 24  08/14/2021  8:57 AM 24  11/07/2020  8:56 AM 25   Lives at home with husband and son's family including grandson. Helps out with his daily care. No pets at home. No passive smoke exposure.    I have reviewed the patient's family social and past medical history and updated as appropriate.   Past Medical History:  Diagnosis Date   Allergy    seasonal allergies   Asthma    uses unhaler   Blood transfusion without reported diagnosis 1990's   Cataract    bilateral sx   Depression    on meds   High blood pressure    on meds   HIV (human immunodeficiency virus infection) (HCC)    on meds   Hx of adenomatous polyp of colon 2015   Hyperlipidemia    on meds    Osteoarthritis    Osteopenia     Past Surgical History:  Procedure Laterality Date   Biopsy of Liver     CATARACT EXTRACTION, BILATERAL     CHOLECYSTECTOMY     COLONOSCOPY  2015   MAC-Dr.Liakos-prep(exc)-TA x 1   CYST REMOVAL HAND Left    CYSTECTOMY     off of back   LAPAROSCOPIC SALPINGOOPHERECTOMY Right    POLYPECTOMY  2015   TA    Family History  Problem Relation Age of Onset   Diabetes Mother        died at age 67   Hypertension Mother    Asthma Sister    Diabetes Sister    Hypertension Sister    Arthritis Sister    Diabetes Sister    Hypertension Sister    Asthma Sister    Arthritis Sister    Heart defect Sister    Asthma Son        controlled   Crohn's disease Niece    Colon polyps Neg Hx    Colon cancer Neg Hx    Esophageal cancer Neg Hx    Stomach cancer Neg Hx     Social History   Occupational History   Not on file  Tobacco Use   Smoking status: Former    Current packs/day: 0.00    Average packs/day: 1 pack/day for 14.0 years (14.0 ttl pk-yrs)  Types: Cigarettes    Start date: 09/14/1964    Quit date: 09/14/1978    Years since quitting: 44.7   Smokeless tobacco: Never  Vaping Use   Vaping status: Never Used  Substance and Sexual Activity   Alcohol use: No   Drug use: No   Sexual activity: Yes    Partners: Male    Birth control/protection: Condom    Comment: declined condoms     Physical Exam: Blood pressure 122/76, pulse 70, height 5\' 7"  (1.702 m), weight 170 lb 9.6 oz (77.4 kg), SpO2 98%.  Gen:      NAD HEENT:   no thrush, mild cobblestoning, mallampati IV Lungs:    ctab no wheezes or crackles CV:         RRR no mrg no edema   Data Reviewed: Imaging: I have personally reviewed the chest xray June 2021 - mild hyperinflation.   PFTs:      Latest Ref Rng & Units 04/22/2020    9:37 AM  PFT Results  FVC-Pre L 2.48   FVC-Predicted Pre % 88   FVC-Post L 2.55   FVC-Predicted Post % 91   Pre FEV1/FVC % % 61   Post FEV1/FCV % %  71   FEV1-Pre L 1.52   FEV1-Predicted Pre % 69   FEV1-Post L 1.81   DLCO uncorrected ml/min/mmHg 19.68   DLCO UNC% % 88   DLCO corrected ml/min/mmHg 19.68   DLCO COR %Predicted % 88   DLVA Predicted % 106   TLC L 4.78   TLC % Predicted % 84   RV % Predicted % 93    I have personally reviewed the patient's PFTs and show mild airflow limitation with +BD response.   Labs: Elevated eosinophils  Noted on prior CBCs while on prednisone.   Immunization status: Immunization History  Administered Date(s) Administered   Fluad Quad(high Dose 65+) 05/16/2019, 05/31/2020, 06/06/2021, 06/16/2022   Influenza,inj,Quad PF,6+ Mos 06/17/2017, 06/09/2018   Influenza-Unspecified 06/11/2016   Moderna Covid-19 Vaccine Bivalent Booster 62yrs & up 07/12/2021   Moderna Sars-Covid-2 Vaccination 07/06/2020   PFIZER(Purple Top)SARS-COV-2 Vaccination 10/20/2019, 11/14/2019   Pneumococcal Conjugate-13 05/20/2018   Pneumococcal Polysaccharide-23 02/15/2017   Respiratory Syncytial Virus Vaccine,Recomb Aduvanted(Arexvy) 09/21/2022   Tdap 09/21/2017   Zoster Recombinant(Shingrix) 07/19/2018, 02/13/2019    External Notes reviewed: PCP and general surgery  Assessment:  Severe persistent eosinophilic asthma, well controlled Allergic Rhinitis, well controlled HIV, well controlled  Plan/Recommendations: Glad your asthma is doing well off dupixent. Continue advair 1 puff twice daily, gargle after use - increase to 2 puffs twice daily if asthma symptoms not well controlled or you feel like you are getting sick.   Continue singulair, zyrtec, flonase for allergies.   Add astelin nasal spray if not well controlled.   Call us sooner than one year if issues with breathing.   Return to Care: Return in about 1 year (around 05/17/2024).   Durel Salts, MD Pulmonary and Critical Care Medicine [facility] Office:[phone]

## 2023-05-18 NOTE — Patient Instructions (Addendum)
Please schedule follow up scheduled with myself in 1 year.  If my schedule is not open yet, we will contact you with a reminder closer to that time. Please call [phone] if you haven't heard from [place] a month before.   Glad your asthma is doing well off dupixent. Continue advair 1 puff twice daily, gargle after use - increase to 2 puffs twice daily if asthma symptoms not well controlled or you feel like you are getting sick.   Continue singulair, zyrtec, flonase for allergies.   Add astelin nasal spray if not well controlled.   Call us sooner than one year if issues with breathing.

## 2023-05-21 ENCOUNTER — Other Ambulatory Visit (HOSPITAL_BASED_OUTPATIENT_CLINIC_OR_DEPARTMENT_OTHER): Payer: Self-pay

## 2023-05-27 ENCOUNTER — Other Ambulatory Visit (HOSPITAL_COMMUNITY): Payer: Self-pay

## 2023-05-31 ENCOUNTER — Other Ambulatory Visit (HOSPITAL_COMMUNITY): Payer: Self-pay

## 2023-06-01 ENCOUNTER — Other Ambulatory Visit (HOSPITAL_BASED_OUTPATIENT_CLINIC_OR_DEPARTMENT_OTHER): Payer: Self-pay

## 2023-06-07 ENCOUNTER — Other Ambulatory Visit (HOSPITAL_BASED_OUTPATIENT_CLINIC_OR_DEPARTMENT_OTHER): Payer: Self-pay

## 2023-06-29 ENCOUNTER — Ambulatory Visit (HOSPITAL_BASED_OUTPATIENT_CLINIC_OR_DEPARTMENT_OTHER)
Admission: RE | Admit: 2023-06-29 | Discharge: 2023-06-29 | Disposition: A | Discharge status: Home / Self Care | Payer: PPO | Source: Ambulatory Visit | Attending: Emergency Medicine | Admitting: Emergency Medicine

## 2023-06-29 ENCOUNTER — Ambulatory Visit
Admission: EM | Admit: 2023-06-29 | Discharge: 2023-06-29 | Disposition: A | Discharge status: Home / Self Care | Payer: 59 | Attending: Emergency Medicine | Admitting: Emergency Medicine

## 2023-06-29 DIAGNOSIS — S9032XA Contusion of left foot, initial encounter: Secondary | ICD-10-CM | POA: Insufficient documentation

## 2023-06-29 DIAGNOSIS — S99921A Unspecified injury of right foot, initial encounter: Secondary | ICD-10-CM | POA: Diagnosis not present

## 2023-06-29 DIAGNOSIS — M7989 Other specified soft tissue disorders: Secondary | ICD-10-CM | POA: Diagnosis not present

## 2023-06-29 DIAGNOSIS — M85872 Other specified disorders of bone density and structure, left ankle and foot: Secondary | ICD-10-CM | POA: Diagnosis not present

## 2023-06-29 DIAGNOSIS — X58XXXA Exposure to other specified factors, initial encounter: Secondary | ICD-10-CM | POA: Insufficient documentation

## 2023-06-29 DIAGNOSIS — M79672 Pain in left foot: Secondary | ICD-10-CM | POA: Diagnosis not present

## 2023-06-29 DIAGNOSIS — S99911A Unspecified injury of right ankle, initial encounter: Secondary | ICD-10-CM

## 2023-06-29 NOTE — ED Triage Notes (Signed)
Pt presents to UC w/ c/o twisting her right ankle 2 days ago. She tripped on her bedroom slippers. She is able to bear weight on it. Outer part of ankle is bruised and swollen.

## 2023-06-29 NOTE — Discharge Instructions (Signed)
Please go to [facility] at [facility] to have x-ray of your right foot performed.  Once we receive the results of your x-ray, we will contact you by phone to let you know what it showed.  We will also provide you with further recommendations if needed.  In the meantime please continue to wear your Ace wrap at all times, keep your foot elevated when seated and avoid any activities that cause you pain.  Thank you for visiting [facility] today.  We appreciate the opportunity to participate in your care.

## 2023-06-29 NOTE — ED Provider Notes (Signed)
[facility]    CSN: [id number] Arrival date & time: 06/29/23  1244    HISTORY   Chief Complaint  Patient presents with   Ankle Pain   HPI Maria Adkins is a pleasant, 72 y.o. female who presents to urgent care today. Patient states that 2 days ago she tripped over her slippers in her bedroom, rolled her right ankle outward.  Patient denies hearing a pop or snap when this occurred.  Patient states she did not fall to the floor.  Patient states that over the course of the past 2 days the ankle has become more swollen and appears to be more bruised.  Patient states it is also very tender to touch.  Patient states when she wears an Ace wrap is much more comfortable.  Patient states she is able to bear weight and walk on it.  Patient states she drove herself to our clinic today.  Patient denies history of injury to her right ankle and foot.  Patient states she has been keeping her foot elevated and applying ice to it.  The history is provided by the patient.   Past Medical History:  Diagnosis Date   Allergy    seasonal allergies   Asthma    uses unhaler   Blood transfusion without reported diagnosis 1990's   Cataract    bilateral sx   Depression    on meds   High blood pressure    on meds   HIV (human immunodeficiency virus infection) (HCC)    on meds   Hx of adenomatous polyp of colon 2015   Hyperlipidemia    on meds   Osteoarthritis    Osteopenia    Patient Active Problem List   Diagnosis Date Noted   Depression, major, single episode, complete remission (HCC) 03/28/2021   Mixed hyperlipidemia 03/28/2021   Primary insomnia 03/28/2021   Depression, major, single episode, moderate (HCC) 03/28/2021   Chronic idiopathic constipation 03/28/2021   Seasonal allergies 01/13/2018   Asthma 02/05/2017   Allergy 12/16/2016   Age related osteoporosis 08/19/2016   Essential hypertension 08/19/2016   Human immunodeficiency virus (HIV) disease (HCC) 08/19/2016   Hx of  adenomatous polyp of colon 2015   Past Surgical History:  Procedure Laterality Date   Biopsy of Liver     CATARACT EXTRACTION, BILATERAL     CHOLECYSTECTOMY     COLONOSCOPY  2015   MAC-Dr.Liakos-prep(exc)-TA x 1   CYST REMOVAL HAND Left    CYSTECTOMY     off of back   LAPAROSCOPIC SALPINGOOPHERECTOMY Right    POLYPECTOMY  2015   TA   OB History   No obstetric history on file.    Home Medications    Prior to Admission medications   Medication Sig Start Date End Date Taking? Authorizing Provider  albuterol (PROVENTIL) (2.5 MG/3ML) 0.083% nebulizer solution Take 3 mLs (2.5 mg total) by nebulization every 4 (four) hours as needed for wheezing or shortness of breath. 12/22/19   Sharon Seller, NP  albuterol (VENTOLIN HFA) 108 (90 Base) MCG/ACT inhaler INHALE 2 PUFFS BY MOUTH INTO THE LUNGS EVERY 4 HOURS AS NEEDED 05/07/20   Sharon Seller, NP  ascorbic acid (VITAMIN C) 500 MG tablet Take by mouth.    [provider]  bictegravir-emtricitabine-tenofovir AF (BIKTARVY) 50-200-25 MG TABS tablet TAKE 1 TABLET BY MOUTH DAILY. 09/21/22 09/21/23  Judyann Munson, MD  Biotin w/ Vitamins C & E (HAIR/SKIN/NAILS PO) Take by mouth.    [provider]  Blood Pressure Monitoring (BLOOD PRESSURE CUFF) MISC 1 Device by Does not apply route daily. DX: I10 07/14/19   Sharon Seller, NP  Calcium Carbonate (CALCIUM 600 PO) Take 1 tablet by mouth daily.     [provider]  Cholecalciferol (VITAMIN D) 125 MCG (5000 UT) CAPS Take by mouth.    [provider]  ELDERBERRY PO Take by mouth.    [provider]  enalapril (VASOTEC) 20 MG tablet Take 1 tablet (20 mg total) by mouth daily. 03/10/23   Ngetich, Dinah C, NP  fluconazole (DIFLUCAN) 150 MG tablet Take 1 tablet by mouth and repeat dose in one week 02/19/23   Ngetich, Dinah C, NP  fluticasone (FLONASE) 50 MCG/ACT nasal spray Place 1 spray into both nostrils daily. 05/18/23   Charlott Holler, MD   fluticasone-salmeterol (ADVAIR HFA) [phone] MCG/ACT inhaler Inhale 1 puff into the lungs 2 (two) times daily. 05/18/23   Charlott Holler, MD  hydrochlorothiazide (HYDRODIURIL) 25 MG tablet Take 1 tablet (25 mg total) by mouth daily. 03/10/23   Ngetich, Dinah C, NP  metoprolol tartrate (LOPRESSOR) 100 MG tablet Take 1 tablet (100 mg total) by mouth daily. 01/20/23 01/20/24  Ngetich, Dinah C, NP  montelukast (SINGULAIR) 10 MG tablet Take 1 tablet (10 mg total) by mouth at bedtime. 05/18/23   Charlott Holler, MD  Multiple Vitamins-Minerals (MULTIVITAMIN GUMMIES ADULT PO) Take 2 tablets by mouth daily. Gummy    [provider]  Multiple Vitamins-Minerals (ZINC PO) Take by mouth.    [provider]  pravastatin (PRAVACHOL) 20 MG tablet Take 1 tablet (20 mg total) by mouth daily. 04/06/23 04/05/24  Ngetich, Dinah C, NP  sertraline (ZOLOFT) 25 MG tablet Take 1 tablet (25 mg total) by mouth daily. 05/21/22 05/21/23  Sharon Seller, NP  Spacer/Aero-Holding Rudean Curt Use with advair [facility] 08/14/21   Charlott Holler, MD  [facility] syringe Inject 0.5 mLs into the muscle once. 06/25/22   [provider]  traZODone (DESYREL) 50 MG tablet Take 1/2-1 tablet (25-50 mg total) by mouth at bedtime as needed for sleep. 12/18/22   Ngetich, Dinah C, NP  vitamin B-12 (CYANOCOBALAMIN) 500 MCG tablet Take 500 mcg by mouth daily.    [provider]  linaclotide Karlene Einstein) 145 MCG CAPS capsule Take 1 capsule (145 mcg total) by mouth daily before breakfast. 09/25/20 10/28/20  Sharon Seller, NP    Family History Family History  Problem Relation Age of Onset   Diabetes Mother        died at age 49   Hypertension Mother    Asthma Sister    Diabetes Sister    Hypertension Sister    Arthritis Sister    Diabetes Sister    Hypertension Sister    Asthma Sister    Arthritis Sister    Heart defect Sister    Asthma Son        controlled   Crohn's disease Niece    Colon polyps Neg Hx    Colon  cancer Neg Hx    Esophageal cancer Neg Hx    Stomach cancer Neg Hx    Social History Social History   Tobacco Use   Smoking status: Former    Current packs/day: 0.00    Average packs/day: 1 pack/day for 14.0 years (14.0 ttl pk-yrs)    Types: Cigarettes    Start date: 09/14/1964    Quit date: 09/14/1978    Years since quitting: 44.8   Smokeless  tobacco: Never  Vaping Use   Vaping status: Never Used  Substance Use Topics   Alcohol use: No   Drug use: No   Allergies   Patient has no known allergies.  Review of Systems Review of Systems Pertinent findings revealed after performing a 14 point review of systems has been noted in the history of present illness.  Physical Exam Vital Signs BP (!) 146/87 (BP Location: Right Arm)   Pulse (!) 56   Temp (!) 97.5 F (36.4 C) (Oral)   Resp 16   SpO2 94%   No data found.  Physical Exam Vitals and nursing note reviewed.  Constitutional:      General: She is not in acute distress.    Appearance: Normal appearance.  HENT:     Head: Normocephalic and atraumatic.  Eyes:     Pupils: Pupils are equal, round, and reactive to light.  Cardiovascular:     Rate and Rhythm: Normal rate and regular rhythm.  Pulmonary:     Effort: Pulmonary effort is normal.     Breath sounds: Normal breath sounds.  Musculoskeletal:     Cervical back: Normal range of motion and neck supple.     Right foot: Decreased range of motion. Normal capillary refill. Swelling and tenderness present. No deformity, bunion, Charcot foot, foot drop, prominent metatarsal heads or laceration.       Feet:  Skin:    General: Skin is warm and dry.  Neurological:     General: No focal deficit present.     Mental Status: She is alert and oriented to person, place, and time. Mental status is at baseline.  Psychiatric:        Mood and Affect: Mood normal.        Behavior: Behavior normal.        Thought Content: Thought content normal.        Judgment: Judgment normal.      Visual Acuity Right Eye Distance:   Left Eye Distance:   Bilateral Distance:    Right Eye Near:   Left Eye Near:    Bilateral Near:     UC Couse / Diagnostics / Procedures:     Radiology No results found.  Procedures Procedures (including critical care time) EKG  Pending results:  Labs Reviewed - No data to display  Medications Ordered in UC: Medications - No data to display  UC Diagnoses / Final Clinical Impressions(s)   I have reviewed the triage vital signs and the nursing notes.  Pertinent labs & imaging results that were available during my care of the patient were reviewed by me and considered in my medical decision making (see chart for details).    Final diagnoses:  Injury of right ankle, initial encounter   Suspect ankle is severely sprained however because of the excessive bruising believe that x-ray is indicated.  We do not have an x-ray tech here at this location today.  Patient was agreeable to going to our [facility] location to have this done.  We will contact her with the results once we receive them and provide her with further recommendations as needed.  Please see discharge instructions below for details of plan of care as provided to patient. ED Prescriptions   None    PDMP not reviewed this encounter.  Pending results:  Labs Reviewed - No data to display  Discharge Instructions:   Discharge Instructions      Please go to [facility] at [address] to have x-ray of your right foot performed.  Once we receive the results of your x-ray, we will contact you by phone to let you know what it showed.  We will also provide you with further recommendations if needed.  In the meantime please continue to wear your Ace wrap at all times, keep your foot elevated when seated and avoid any activities that cause you pain.  Thank you for visiting  Urgent Care today.  We appreciate the opportunity to  participate in your care.    Disposition Upon Discharge:  Condition: stable for discharge home  Patient presented with an acute illness with associated systemic symptoms and significant discomfort requiring urgent management. In my opinion, this is a condition that a prudent lay person (someone who possesses an average knowledge of health and medicine) may potentially expect to result in complications if not addressed urgently such as respiratory distress, impairment of bodily function or dysfunction of bodily organs.   Routine symptom specific, illness specific and/or disease specific instructions were discussed with the patient and/or caregiver at length.   As such, the patient has been evaluated and assessed, work-up was performed and treatment was provided in alignment with urgent care protocols and evidence based medicine.  Patient/parent/caregiver has been advised that the patient may require follow up for further testing and treatment if the symptoms continue in spite of treatment, as clinically indicated and appropriate.  Patient/parent/caregiver has been advised to return to the [facility] or PCP if no better; to PCP or the Emergency Department if new signs and symptoms develop, or if the current signs or symptoms continue to change or worsen for further workup, evaluation and treatment as clinically indicated and appropriate  The patient will follow up with their current PCP if and as advised. If the patient does not currently have a PCP we will assist them in obtaining one.   The patient may need specialty follow up if the symptoms continue, in spite of conservative treatment and management, for further workup, evaluation, consultation and treatment as clinically indicated and appropriate.  Patient/parent/caregiver verbalized understanding and agreement of plan as discussed.  All questions were addressed during visit.  Please see discharge instructions below for further details of  plan.  This office note has been dictated using Teaching laboratory technician.  Unfortunately, this method of dictation can sometimes lead to typographical or grammatical errors.  I apologize for your inconvenience in advance if this occurs.  Please do not hesitate to reach out to me if clarification is needed.      Theadora Rama Scales, PA-C 06/29/23 1330

## 2023-07-05 DIAGNOSIS — H524 Presbyopia: Secondary | ICD-10-CM | POA: Diagnosis not present

## 2023-07-08 ENCOUNTER — Other Ambulatory Visit: Payer: Self-pay

## 2023-07-08 ENCOUNTER — Other Ambulatory Visit (HOSPITAL_BASED_OUTPATIENT_CLINIC_OR_DEPARTMENT_OTHER): Payer: Self-pay

## 2023-07-08 NOTE — Progress Notes (Signed)
Specialty Pharmacy Refill Coordination Note  Maria Adkins is a 72 y.o. female contacted today regarding refills of specialty medication(s) Bictegravir-Emtricitab-Tenofov   Patient requested Delivery   Delivery date: 07/16/23   Verified address: [address]   [place] [address]   Medication will be filled on 07/15/23.

## 2023-07-09 ENCOUNTER — Other Ambulatory Visit (HOSPITAL_BASED_OUTPATIENT_CLINIC_OR_DEPARTMENT_OTHER): Payer: Self-pay

## 2023-07-09 ENCOUNTER — Other Ambulatory Visit (HOSPITAL_BASED_OUTPATIENT_CLINIC_OR_DEPARTMENT_OTHER): Payer: Self-pay | Admitting: Internal Medicine

## 2023-07-09 DIAGNOSIS — Z139 Encounter for screening, unspecified: Secondary | ICD-10-CM

## 2023-07-09 MED ORDER — COVID-19 MRNA VAC-TRIS(PFIZER) 30 MCG/0.3ML IM SUSY
0.3000 mL | PREFILLED_SYRINGE | Freq: Once | INTRAMUSCULAR | 0 refills | Status: AC
  Filled 2023-07-09: qty 0.3, 1d supply, fill #0

## 2023-07-09 MED ORDER — INFLUENZA VAC A&B SURF ANT ADJ 0.5 ML IM SUSY
0.5000 mL | PREFILLED_SYRINGE | Freq: Once | INTRAMUSCULAR | 0 refills | Status: AC
  Filled 2023-07-09: qty 0.5, 1d supply, fill #0

## 2023-07-12 ENCOUNTER — Ambulatory Visit (HOSPITAL_BASED_OUTPATIENT_CLINIC_OR_DEPARTMENT_OTHER)
Admission: RE | Admit: 2023-07-12 | Discharge: 2023-07-12 | Disposition: A | Discharge status: Home / Self Care | Payer: PPO | Source: Ambulatory Visit | Attending: Internal Medicine | Admitting: Internal Medicine

## 2023-07-12 ENCOUNTER — Encounter (HOSPITAL_BASED_OUTPATIENT_CLINIC_OR_DEPARTMENT_OTHER): Payer: Self-pay

## 2023-07-12 DIAGNOSIS — Z139 Encounter for screening, unspecified: Secondary | ICD-10-CM

## 2023-07-12 DIAGNOSIS — R92323 Mammographic fibroglandular density, bilateral breasts: Secondary | ICD-10-CM | POA: Diagnosis not present

## 2023-07-12 DIAGNOSIS — Z1231 Encounter for screening mammogram for malignant neoplasm of breast: Secondary | ICD-10-CM | POA: Insufficient documentation

## 2023-07-22 ENCOUNTER — Other Ambulatory Visit: Payer: Self-pay | Admitting: Nurse Practitioner

## 2023-07-22 ENCOUNTER — Other Ambulatory Visit: Payer: Self-pay

## 2023-07-22 ENCOUNTER — Other Ambulatory Visit (HOSPITAL_BASED_OUTPATIENT_CLINIC_OR_DEPARTMENT_OTHER): Payer: Self-pay

## 2023-07-22 ENCOUNTER — Other Ambulatory Visit: Payer: Self-pay | Admitting: Family

## 2023-07-22 DIAGNOSIS — F321 Major depressive disorder, single episode, moderate: Secondary | ICD-10-CM

## 2023-07-22 MED ORDER — SERTRALINE HCL 25 MG PO TABS
25.0000 mg | ORAL_TABLET | Freq: Every day | ORAL | 2 refills | Status: DC
  Filled 2023-07-22: qty 90, 90d supply, fill #0

## 2023-07-22 MED ORDER — METOPROLOL TARTRATE 100 MG PO TABS
100.0000 mg | ORAL_TABLET | Freq: Every day | ORAL | 1 refills | Status: DC
  Filled 2023-07-22: qty 90, 90d supply, fill #0
  Filled 2023-10-17: qty 90, 90d supply, fill #1

## 2023-07-22 NOTE — Telephone Encounter (Signed)
Patient is requesting a refill of the following medications: Requested Prescriptions   Pending Prescriptions Disp Refills   sertraline (ZOLOFT) 25 MG tablet 90 tablet 2    Sig: Take 1 tablet (25 mg total) by mouth daily.    Date of last refill:05/21/2022  Refill amount: 90 tablets 2 refills patient medication expired 05/21/2023  Treatment agreement date: n/A

## 2023-07-27 ENCOUNTER — Other Ambulatory Visit: Payer: Self-pay

## 2023-07-31 ENCOUNTER — Other Ambulatory Visit (HOSPITAL_COMMUNITY): Payer: Self-pay

## 2023-08-04 ENCOUNTER — Other Ambulatory Visit: Payer: Self-pay

## 2023-08-04 NOTE — Progress Notes (Signed)
Specialty Pharmacy Refill Coordination Note  Maria Adkins is a 72 y.o. female contacted today regarding refills of specialty medication(s) Bictegravir-Emtricitab-Tenofov   Patient requested Delivery   Delivery date: 08/16/23   Verified address: [address]   [place] [address]   Medication will be filled on 08/13/23.

## 2023-08-05 ENCOUNTER — Other Ambulatory Visit (HOSPITAL_BASED_OUTPATIENT_CLINIC_OR_DEPARTMENT_OTHER): Payer: Self-pay

## 2023-08-24 ENCOUNTER — Encounter: Payer: PPO | Admitting: Family

## 2023-08-26 ENCOUNTER — Ambulatory Visit (INDEPENDENT_AMBULATORY_CARE_PROVIDER_SITE_OTHER): Payer: PPO | Admitting: Family

## 2023-08-26 ENCOUNTER — Encounter: Payer: Self-pay | Admitting: Family

## 2023-08-26 VITALS — BP 126/80 | Temp 98.6°F | Resp 20 | Ht 67.0 in | Wt 174.4 lb

## 2023-08-26 DIAGNOSIS — Z0000 Encounter for general adult medical examination without abnormal findings: Secondary | ICD-10-CM

## 2023-08-26 NOTE — Patient Instructions (Addendum)
[name] , Thank you for taking time to come for your Medicare Wellness Visit. I appreciate your ongoing commitment to your health goals. Please review the following plan we discussed and let me know if I can assist you in the future.   Screening recommendations/referrals: Colonoscopy : Up to date  Mammogram : Up to date  Bone Density : Up to date  Recommended yearly ophthalmology/optometry visit for glaucoma screening and checkup Recommended yearly dental visit for hygiene and checkup  Vaccinations: Influenza vaccine- due annually in September/October Pneumococcal vaccine : Up to date  Tdap vaccine : Up to date  Shingles vaccine : Up to date     Advanced directives: No   Conditions/risks identified: advanced age (>74men, >7 women);smoking/ tobacco exposure;hypertension  Next appointment: 1 year    Preventive Care 74 Years and Older, Female Preventive care refers to lifestyle choices and visits with your health care provider that can promote health and wellness. What does preventive care include? A yearly physical exam. This is also called an annual well check. Dental exams once or twice a year. Routine eye exams. Ask your health care provider how often you should have your eyes checked. Personal lifestyle choices, including: Daily care of your teeth and gums. Regular physical activity. Eating a healthy diet. Avoiding tobacco and drug use. Limiting alcohol use. Practicing safe sex. Taking low-dose aspirin every day. Taking vitamin and mineral supplements as recommended by your health care provider. What happens during an annual well check? The services and screenings done by your health care provider during your annual well check will depend on your age, overall health, lifestyle risk factors, and family history of disease. Counseling  Your health care provider may ask you questions about your: Alcohol use. Tobacco use. Drug use. Emotional well-being. Home and  relationship well-being. Sexual activity. Eating habits. History of falls. Memory and ability to understand (cognition). Work and work Astronomer. Reproductive health. Screening  You may have the following tests or measurements: Height, weight, and BMI. Blood pressure. Lipid and cholesterol levels. These may be checked every 5 years, or more frequently if you are over 74 years old. Skin check. Lung cancer screening. You may have this screening every year starting at age 28 if you have a 30-pack-year history of smoking and currently smoke or have quit within the past 15 years. Fecal occult blood test (FOBT) of the stool. You may have this test every year starting at age 23. Flexible sigmoidoscopy or colonoscopy. You may have a sigmoidoscopy every 5 years or a colonoscopy every 10 years starting at age 89. Hepatitis C blood test. Hepatitis B blood test. Sexually transmitted disease (STD) testing. Diabetes screening. This is done by checking your blood sugar (glucose) after you have not eaten for a while (fasting). You may have this done every 1-3 years. Bone density scan. This is done to screen for osteoporosis. You may have this done starting at age 19. Mammogram. This may be done every 1-2 years. Talk to your health care provider about how often you should have regular mammograms. Talk with your health care provider about your test results, treatment options, and if necessary, the need for more tests. Vaccines  Your health care provider may recommend certain vaccines, such as: Influenza vaccine. This is recommended every year. Tetanus, diphtheria, and acellular pertussis (Tdap, Td) vaccine. You may need a Td booster every 10 years. Zoster vaccine. You may need this after age 58. Pneumococcal 13-valent conjugate (PCV13) vaccine. One dose is recommended after  age 46. Pneumococcal polysaccharide (PPSV23) vaccine. One dose is recommended after age 32. Talk to your health care provider  about which screenings and vaccines you need and how often you need them. This information is not intended to replace advice given to you by your health care provider. Make sure you discuss any questions you have with your health care provider. Document Released: 09/27/2015 Document Revised: 05/20/2016 Document Reviewed: 07/02/2015 Elsevier Interactive Patient Education  2017 [employer].  Fall Prevention in the Home Falls can cause injuries. They can happen to people of all ages. There are many things you can do to make your home safe and to help prevent falls. What can I do on the outside of my home? Regularly fix the edges of walkways and driveways and fix any cracks. Remove anything that might make you trip as you walk through a door, such as a raised step or threshold. Trim any bushes or trees on the path to your home. Use bright outdoor lighting. Clear any walking paths of anything that might make someone trip, such as rocks or tools. Regularly check to see if handrails are loose or broken. Make sure that both sides of any steps have handrails. Any raised decks and porches should have guardrails on the edges. Have any leaves, snow, or ice cleared regularly. Use sand or salt on walking paths during winter. Clean up any spills in your garage right away. This includes oil or grease spills. What can I do in the bathroom? Use night lights. Install grab bars by the toilet and in the tub and shower. Do not use towel bars as grab bars. Use non-skid mats or decals in the tub or shower. If you need to sit down in the shower, use a plastic, non-slip stool. Keep the floor dry. Clean up any water that spills on the floor as soon as it happens. Remove soap buildup in the tub or shower regularly. Attach bath mats securely with double-sided non-slip rug tape. Do not have throw rugs and other things on the floor that can make you trip. What can I do in the bedroom? Use night lights. Make sure  that you have a light by your bed that is easy to reach. Do not use any sheets or blankets that are too big for your bed. They should not hang down onto the floor. Have a firm chair that has side arms. You can use this for support while you get dressed. Do not have throw rugs and other things on the floor that can make you trip. What can I do in the kitchen? Clean up any spills right away. Avoid walking on wet floors. Keep items that you use a lot in easy-to-reach places. If you need to reach something above you, use a strong step stool that has a grab bar. Keep electrical cords out of the way. Do not use floor polish or wax that makes floors slippery. If you must use wax, use non-skid floor wax. Do not have throw rugs and other things on the floor that can make you trip. What can I do with my stairs? Do not leave any items on the stairs. Make sure that there are handrails on both sides of the stairs and use them. Fix handrails that are broken or loose. Make sure that handrails are as long as the stairways. Check any carpeting to make sure that it is firmly attached to the stairs. Fix any carpet that is loose or worn. Avoid having throw rugs  at the top or bottom of the stairs. If you do have throw rugs, attach them to the floor with carpet tape. Make sure that you have a light switch at the top of the stairs and the bottom of the stairs. If you do not have them, ask someone to add them for you. What else can I do to help prevent falls? Wear shoes that: Do not have high heels. Have rubber bottoms. Are comfortable and fit you well. Are closed at the toe. Do not wear sandals. If you use a stepladder: Make sure that it is fully opened. Do not climb a closed stepladder. Make sure that both sides of the stepladder are locked into place. Ask someone to hold it for you, if possible. Clearly mark and make sure that you can see: Any grab bars or handrails. First and last steps. Where the edge of  each step is. Use tools that help you move around (mobility aids) if they are needed. These include: Canes. Walkers. Scooters. Crutches. Turn on the lights when you go into a dark area. Replace any light bulbs as soon as they burn out. Set up your furniture so you have a clear path. Avoid moving your furniture around. If any of your floors are uneven, fix them. If there are any pets around you, be aware of where they are. Review your medicines with your doctor. Some medicines can make you feel dizzy. This can increase your chance of falling. Ask your doctor what other things that you can do to help prevent falls. This information is not intended to replace advice given to you by your health care provider. Make sure you discuss any questions you have with your health care provider. Document Released: 06/27/2009 Document Revised: 02/06/2016 Document Reviewed: 10/05/2014 Elsevier Interactive Patient Education  2017 [employer].

## 2023-08-26 NOTE — Progress Notes (Signed)
Subjective:   Maria Adkins is a 72 y.o. female who presents for Medicare Annual (Subsequent) preventive examination.  Visit Complete: In person  Patient Medicare AWV questionnaire was completed by the patient on 08/26/2023; I have confirmed that all information answered by patient is correct and no changes since this date.  Cardiac Risk Factors include: advanced age (>13men, >51 women);smoking/ tobacco exposure;hypertension     Objective:    Today's Vitals   08/26/23 1541  BP: 126/80  Resp: 20  Temp: 98.6 F (37 C)  SpO2: 98%  Weight: 174 lb 6.4 oz (79.1 kg)  Height: 5\' 7"  (1.702 m)   Body mass index is 27.31 kg/m.     08/26/2023    3:36 PM 04/19/2023    8:40 AM 02/12/2023   12:20 PM 10/15/2022    9:45 AM 10/06/2022    1:40 PM 08/17/2022    1:15 PM 08/13/2022   10:11 AM  Advanced Directives  Does Patient Have a Medical Advance Directive? No No No No No No Yes  Type of Tax inspector;Living will;Out of facility DNR (pink MOST or yellow form)  Does patient want to make changes to medical advance directive?    No - Patient declined No - Patient declined  No - Patient declined  Would patient like information on creating a medical advance directive? No - Patient declined No - Patient declined         Current Medications (verified) Outpatient Encounter Medications as of 08/26/2023  Medication Sig   albuterol (PROVENTIL) (2.5 MG/3ML) 0.083% nebulizer solution Take 3 mLs (2.5 mg total) by nebulization every 4 (four) hours as needed for wheezing or shortness of breath.   albuterol (VENTOLIN HFA) 108 (90 Base) MCG/ACT inhaler INHALE 2 PUFFS BY MOUTH INTO THE LUNGS EVERY 4 HOURS AS NEEDED   ascorbic acid (VITAMIN C) 500 MG tablet Take by mouth.   bictegravir-emtricitabine-tenofovir AF (BIKTARVY) 50-200-25 MG TABS tablet TAKE 1 TABLET BY MOUTH DAILY.   Biotin w/ Vitamins C & E (HAIR/SKIN/NAILS PO) Take by mouth.   Blood Pressure Monitoring  (BLOOD PRESSURE CUFF) MISC 1 Device by Does not apply route daily. DX: I10   Calcium Carbonate (CALCIUM 600 PO) Take 1 tablet by mouth daily.    Cholecalciferol (VITAMIN D) 125 MCG (5000 UT) CAPS Take by mouth.   ELDERBERRY PO Take by mouth.   enalapril (VASOTEC) 20 MG tablet Take 1 tablet (20 mg total) by mouth daily.   fluconazole (DIFLUCAN) 150 MG tablet Take 1 tablet by mouth and repeat dose in one week   fluticasone (FLONASE) 50 MCG/ACT nasal spray Place 1 spray into both nostrils daily.   fluticasone-salmeterol (ADVAIR HFA) 115-21 MCG/ACT inhaler Inhale 1 puff into the lungs 2 (two) times daily.   hydrochlorothiazide (HYDRODIURIL) 25 MG tablet Take 1 tablet (25 mg total) by mouth daily.   metoprolol tartrate (LOPRESSOR) 100 MG tablet Take 1 tablet (100 mg total) by mouth daily.   montelukast (SINGULAIR) 10 MG tablet Take 1 tablet (10 mg total) by mouth at bedtime.   Multiple Vitamins-Minerals (MULTIVITAMIN GUMMIES ADULT PO) Take 2 tablets by mouth daily. Gummy   Multiple Vitamins-Minerals (ZINC PO) Take by mouth.   pravastatin (PRAVACHOL) 20 MG tablet Take 1 tablet (20 mg total) by mouth daily.   sertraline (ZOLOFT) 25 MG tablet Take 1 tablet (25 mg total) by mouth daily.   Spacer/Aero-Holding Rudean Curt Use with advair HFA   SPIKEVAX syringe  Inject 0.5 mLs into the muscle once.   traZODone (DESYREL) 50 MG tablet Take 1/2-1 tablet (25-50 mg total) by mouth at bedtime as needed for sleep.   vitamin B-12 (CYANOCOBALAMIN) 500 MCG tablet Take 500 mcg by mouth daily.   [DISCONTINUED] linaclotide (LINZESS) 145 MCG CAPS capsule Take 1 capsule (145 mcg total) by mouth daily before breakfast.   No facility-administered encounter medications on file as of 08/26/2023.    Allergies (verified) Patient has no known allergies.   History: Past Medical History:  Diagnosis Date   Allergy    seasonal allergies   Asthma    uses unhaler   Blood transfusion without reported diagnosis 1990's    Cataract    bilateral sx   Depression    on meds   High blood pressure    on meds   HIV (human immunodeficiency virus infection) (HCC)    on meds   Hx of adenomatous polyp of colon 2015   Hyperlipidemia    on meds   Osteoarthritis    Osteopenia    Past Surgical History:  Procedure Laterality Date   Biopsy of Liver     CATARACT EXTRACTION, BILATERAL     CHOLECYSTECTOMY     COLONOSCOPY  2015   [facility]-Dr.Liakos-prep(exc)-TA x 1   CYST REMOVAL HAND Left    CYSTECTOMY     off of back   LAPAROSCOPIC SALPINGOOPHERECTOMY Right    POLYPECTOMY  2015   TA   Family History  Problem Relation Age of Onset   Diabetes Mother        died at age 55   Hypertension Mother    Asthma Sister    Diabetes Sister    Hypertension Sister    Arthritis Sister    Diabetes Sister    Hypertension Sister    Asthma Sister    Arthritis Sister    Heart defect Sister    Asthma Son        controlled   Crohn's disease Niece    Colon polyps Neg Hx    Colon cancer Neg Hx    Esophageal cancer Neg Hx    Stomach cancer Neg Hx    Social History   Socioeconomic History   Marital status: Married    Spouse name: Not on file   Number of children: Not on file   Years of education: Not on file   Highest education level: Not on file  Occupational History   Not on file  Tobacco Use   Smoking status: Former    Current packs/day: 0.00    Average packs/day: 1 pack/day for 14.0 years (14.0 ttl pk-yrs)    Types: Cigarettes    Start date: 09/14/1964    Quit date: 09/14/1978    Years since quitting: 44.9   Smokeless tobacco: Never  Vaping Use   Vaping status: Never Used  Substance and Sexual Activity   Alcohol use: No   Drug use: No   Sexual activity: Yes    Partners: Male    Birth control/protection: Condom    Comment: declined condoms  Other Topics Concern   Not on file  Social History Narrative   Diet? Regular      Do you drink/eat things with caffeine? Coffee      Marital status?  Married                                   What  year were you married? March 16, 2009      Do you live in a house, apartment, assisted living, condo, trailer, etc.? House      Is it one or more stories? 1 story      How many persons live in your home? 2      Do you have any pets in your home? (please list) yes-dog      Current or past profession: Administrator      Do you exercise?  no                                    Type & how often? n/a      Do you have a living will? no      Do you have a DNR form?     no                             If not, do you want to discuss one? Yes      Do you have signed POA/HPOA for forms? no      Social Drivers of Corporate investment banker Strain: Low Risk  (12/29/2022)   Overall Financial Resource Strain (CARDIA)    Difficulty of Paying Living Expenses: Not hard at all  Food Insecurity: No Food Insecurity (12/29/2022)   Hunger Vital Sign    Worried About Running Out of Food in the Last Year: Never true    Ran Out of Food in the Last Year: Never true  Transportation Needs: No Transportation Needs (12/29/2022)   PRAPARE - Administrator, Civil Service (Medical): No    Lack of Transportation (Non-Medical): No  Physical Activity: Inactive (07/11/2018)   Exercise Vital Sign    Days of Exercise per Week: 0 days    Minutes of Exercise per Session: 0 min  Stress: No Stress Concern Present (12/29/2022)   [employer] of Occupational Health - Occupational Stress Questionnaire    Feeling of Stress : Not at all  Social Connections: Socially Integrated (12/29/2022)   Social Connection and Isolation Panel [NHANES]    Frequency of Communication with Friends and Family: More than three times a week    Frequency of Social Gatherings with Friends and Family: More than three times a week    Attends Religious Services: More than 4 times per year    Active Member of [employer] or Organizations: Yes    Attends Engineer, structural: More than 4 times per year     Marital Status: Married    Tobacco Counseling Counseling given: Not Answered   Clinical Intake:  Pre-visit preparation completed: No  Pain : No/denies pain     BMI - recorded: 27.31 Nutritional Status: BMI 25 -29 Overweight Nutritional Risks: None Diabetes: No  How often do you need to have someone help you when you read instructions, pamphlets, or other written materials from your doctor or pharmacy?: 1 - Never What is the last grade level you completed in school?: college  Interpreter Needed?: No  Information entered by :: Porsha McClurkin,CMA   Activities of Daily Living    08/26/2023    3:53 PM 08/26/2023    3:33 PM  In your present state of health, do you have any difficulty performing the following activities:  Hearing? 0 0  Vision? 0 0  Difficulty concentrating or making  decisions? 1 0  Comment Remembering   Walking or climbing stairs? 0 0  Dressing or bathing? 0 0  Doing errands, shopping? 0 0  Preparing Food and eating ? N N  Using the Toilet? N N  In the past six months, have you accidently leaked urine? N N  Do you have problems with loss of bowel control? N N  Managing your Medications? N N  Managing your Finances? N N  Housekeeping or managing your Housekeeping? N N    Patient Care Team: Evertte Sones, Donalee Citrin, NP as PCP - General (Family Medicine)  Indicate any recent Medical Services you may have received from other than [facility] providers in the past year (date may be approximate).     Assessment:   This is a routine wellness examination for Maria Adkins.  Hearing/Vision screen No results found.   Goals Addressed             This Visit's Progress    Increase physical activity   Not on track    Starting 09/16/2016, I will attempt to increase my physical activity to 3 times per day.      Increase physical activity   Not on track    To get back to walking- 1.5 hours      Patient Stated       Stay health working in the church         Depression Screen    08/26/2023    3:46 PM 08/26/2023    3:37 PM 04/26/2023    9:46 AM 04/19/2023    9:30 AM 03/08/2023    8:51 AM 02/12/2023   12:20 PM 12/30/2022   10:32 AM  PHQ 2/9 Scores  PHQ - 2 Score 0 0 0 0 0 0 0  PHQ- 9 Score    0   1    Fall Risk    08/26/2023    3:36 PM 04/26/2023    9:46 AM 04/19/2023    9:30 AM 03/08/2023    8:50 AM 02/12/2023   12:20 PM  Fall Risk   Falls in the past year? 0 0 0 0 0  Number falls in past yr: 0  0 0 0  Injury with Fall? 0  0 0 0  Risk for fall due to : No Fall Risks No Fall Risks No Fall Risks    Follow up Falls evaluation completed Falls evaluation completed Falls evaluation completed  Falls evaluation completed    MEDICARE RISK AT HOME: Medicare Risk at Home Any stairs in or around the home?: No If so, are there any without handrails?: No Home free of loose throw rugs in walkways, pet beds, electrical cords, etc?: Yes Adequate lighting in your home to reduce risk of falls?: Yes Life alert?: No Use of a cane, walker or w/c?: No Grab bars in the bathroom?: Yes Shower chair or bench in shower?: Yes Elevated toilet seat or a handicapped toilet?: Yes  TIMED UP AND GO:  Was the test performed?  Yes  Length of time to ambulate 10 feet: 6  sec Gait steady and fast without use of assistive device    Cognitive Function:    08/26/2023    3:37 PM 07/11/2018   11:48 AM 09/16/2016   11:05 AM  MMSE - Mini Mental State Exam  Orientation to time 5 5 5   Orientation to Place 5 5 5   Registration 3 3 3   Attention/ Calculation 5 5 5   Recall 3 2 3   Language-  name 2 objects 2 2 2   Language- repeat 1 1 1   Language- follow 3 step command 3 3 3   Language- read & follow direction 1 1 1   Write a sentence 1 1 1   Copy design 1 1 1   Total score 30 29 30         08/17/2022    1:13 PM 08/01/2021    1:11 PM 07/30/2020    9:05 AM 07/14/2019    8:45 AM  6CIT Screen  What Year? 0 points 0 points 0 points 0 points  What month? 0 points 0  points 0 points 0 points  What time? 0 points 0 points 0 points 0 points  Count back from 20 0 points 0 points 0 points 0 points  Months in reverse 2 points 0 points 0 points 0 points  Repeat phrase 0 points 0 points 0 points 0 points  Total Score 2 points 0 points 0 points 0 points    Immunizations Immunization History  Administered Date(s) Administered   Fluad Quad(high Dose 65+) 05/16/2019, 05/31/2020, 06/06/2021, 06/16/2022   Fluad Trivalent(High Dose 65+) 07/09/2023   Influenza,inj,Quad PF,6+ Mos 06/17/2017, 06/09/2018   Influenza-Unspecified 06/11/2016   Moderna Covid-19 Vaccine Bivalent Booster 60yrs & up 07/12/2021   Moderna Sars-Covid-2 Vaccination 07/06/2020   PFIZER(Purple Top)SARS-COV-2 Vaccination 10/20/2019, 11/14/2019   Pfizer(Comirnaty)Fall Seasonal Vaccine 12 years and older 07/09/2023   Pneumococcal Conjugate-13 05/20/2018   Pneumococcal Polysaccharide-23 02/15/2017   Respiratory Syncytial Virus Vaccine,Recomb Aduvanted(Arexvy) 09/21/2022   Tdap 09/21/2017   Zoster Recombinant(Shingrix) 07/19/2018, 02/13/2019    TDAP status: Up to date  Flu Vaccine status: Up to date  Pneumococcal vaccine status: Up to date  Covid-19 vaccine status: Completed vaccines  Qualifies for Shingles Vaccine? Yes   Zostavax completed No   Shingrix Completed?: Yes  Screening Tests Health Maintenance  Topic Date Due   COVID-19 Vaccine (6 - 2024-25 season) 09/03/2023   Medicare Annual Wellness (AWV)  08/25/2024   MAMMOGRAM  07/11/2025   DTaP/Tdap/Td (2 - Td or Tdap) 09/22/2027   Colonoscopy  12/17/2027   Pneumonia Vaccine 55+ Years old  Completed   INFLUENZA VACCINE  Completed   DEXA SCAN  Completed   Hepatitis C Screening  Completed   Zoster Vaccines- Shingrix  Completed   HPV VACCINES  Aged Out    Health Maintenance  There are no preventive care reminders to display for this patient.   Colorectal cancer screening: Type of screening: Colonoscopy. Completed 12/16/2020.  Repeat every 5 years  Mammogram status: Completed 07/12/2023. Repeat every year  Bone Density status: Completed 09/20/2020. Results reflect: Bone density results: OSTEOPENIA. Repeat every 2 years.  Lung Cancer Screening: (Low Dose CT Chest recommended if Age 63-80 years, 20 pack-year currently smoking OR have quit w/in 15years.) does not qualify.   Lung Cancer Screening Referral: No declined   Additional Screening:  Hepatitis C Screening: does qualify; Completed yes   Vision Screening: Recommended annual ophthalmology exams for early detection of glaucoma and other disorders of the eye. Is the patient up to date with their annual eye exam?  Yes  Who is the provider or what is the name of the office in which the patient attends annual eye exams? [facility] specialist at the [facility] could remember the name.  If pt is not established with a provider, would they like to be referred to a provider to establish care? No .   Dental Screening: Recommended annual dental exams for proper oral hygiene  Diabetic Foot Exam: Diabetic Foot  Exam: Completed N/A   Community Resource Referral / Chronic Care Management: CRR required this visit?  No   CCM required this visit?  No     Plan:     I have personally reviewed and noted the following in the patient's chart:   Medical and social history Use of alcohol, tobacco or illicit drugs  Current medications and supplements including opioid prescriptions. Patient is not currently taking opioid prescriptions. Functional ability and status Nutritional status Physical activity Advanced directives List of other physicians Hospitalizations, surgeries, and ER visits in previous 12 months Vitals Screenings to include cognitive, depression, and falls Referrals and appointments  In addition, I have reviewed and discussed with patient certain preventive protocols, quality metrics, and best practice recommendations. A written personalized care plan for preventive  services as well as general preventive health recommendations were provided to patient.     Caesar Bookman, NP   08/26/2023   After Visit Summary: (In Person-Printed) AVS printed and given to the patient  Nurse Notes: Health maintenance completed

## 2023-08-30 ENCOUNTER — Other Ambulatory Visit (HOSPITAL_BASED_OUTPATIENT_CLINIC_OR_DEPARTMENT_OTHER): Payer: Self-pay

## 2023-09-02 ENCOUNTER — Other Ambulatory Visit: Payer: Self-pay

## 2023-09-02 ENCOUNTER — Other Ambulatory Visit: Payer: Self-pay | Admitting: Internal Medicine

## 2023-09-02 DIAGNOSIS — B20 Human immunodeficiency virus [HIV] disease: Secondary | ICD-10-CM

## 2023-09-02 MED ORDER — BIKTARVY 50-200-25 MG PO TABS
1.0000 | ORAL_TABLET | Freq: Every day | ORAL | 1 refills | Status: DC
  Filled 2023-09-02: qty 30, 30d supply, fill #0
  Filled 2023-10-06: qty 30, 30d supply, fill #1

## 2023-09-02 NOTE — Progress Notes (Signed)
Specialty Pharmacy Refill Coordination Note  Maria Adkins is a 72 y.o. female contacted today regarding refills of specialty medication(s) Bictegravir-Emtricitab-Tenofov Musician)   Patient requested Delivery   Delivery date: 09/13/23   Verified address: [address]  [place] [address]   Medication will be filled on 09/10/23.   Pending refill request

## 2023-09-10 ENCOUNTER — Other Ambulatory Visit: Payer: Self-pay

## 2023-09-29 ENCOUNTER — Other Ambulatory Visit: Payer: Self-pay

## 2023-10-04 ENCOUNTER — Other Ambulatory Visit (HOSPITAL_BASED_OUTPATIENT_CLINIC_OR_DEPARTMENT_OTHER): Payer: Self-pay

## 2023-10-06 ENCOUNTER — Other Ambulatory Visit: Payer: Self-pay

## 2023-10-06 NOTE — Progress Notes (Signed)
Specialty Pharmacy Refill Coordination Note  Maria Adkins is a 73 y.o. female contacted today regarding refills of specialty medication(s) Bictegravir-Emtricitab-Tenofov Musician)   Patient requested Delivery   Delivery date: 10/14/23   Verified address: [address]  [place] [address]   Medication will be filled on 01.29.25.

## 2023-10-13 ENCOUNTER — Other Ambulatory Visit: Payer: Self-pay

## 2023-10-18 ENCOUNTER — Ambulatory Visit (INDEPENDENT_AMBULATORY_CARE_PROVIDER_SITE_OTHER): Payer: PPO | Admitting: Internal Medicine

## 2023-10-18 ENCOUNTER — Other Ambulatory Visit (HOSPITAL_BASED_OUTPATIENT_CLINIC_OR_DEPARTMENT_OTHER): Payer: Self-pay

## 2023-10-18 ENCOUNTER — Encounter: Payer: Self-pay | Admitting: Internal Medicine

## 2023-10-18 ENCOUNTER — Other Ambulatory Visit: Payer: Self-pay

## 2023-10-18 VITALS — BP 147/87 | HR 64 | Temp 98.1°F | Resp 16 | Wt 174.0 lb

## 2023-10-18 DIAGNOSIS — B20 Human immunodeficiency virus [HIV] disease: Secondary | ICD-10-CM | POA: Diagnosis not present

## 2023-10-18 DIAGNOSIS — Z23 Encounter for immunization: Secondary | ICD-10-CM | POA: Diagnosis not present

## 2023-10-18 DIAGNOSIS — N393 Stress incontinence (female) (male): Secondary | ICD-10-CM | POA: Diagnosis not present

## 2023-10-18 DIAGNOSIS — Z79899 Other long term (current) drug therapy: Secondary | ICD-10-CM | POA: Diagnosis not present

## 2023-10-18 DIAGNOSIS — I10 Essential (primary) hypertension: Secondary | ICD-10-CM | POA: Diagnosis not present

## 2023-10-18 DIAGNOSIS — N39490 Overflow incontinence: Secondary | ICD-10-CM | POA: Diagnosis not present

## 2023-10-18 NOTE — Progress Notes (Unsigned)
RFV: follow up ofr hiv disease [name], female   DOB: 03-06-1951, 73 y.o.   MRN: [id number]  HPI The patient presents with follow-up for HIV management and urinary incontinence.  She is stable on her current HIV medication regimen, Biktarvy, with no reported issues. She receives her medication regularly and is satisfied with the current arrangement.  She experiences urinary incontinence, characterized by an inability to hold urine once she feels the urge to urinate. She attributes this to her habit of delaying urination due to a dislike of using public restrooms. There is no associated prolapse or pain.  Her blood pressure was measured at 150/95 this morning, which she notes is unusually high for her. She is currently taking her blood pressure medication but is unsure why it was elevated.  She has recently resumed walking on the treadmill for exercise, doing so for an hour every other day, excluding weekends. She has been maintaining this routine for about two weeks. No chest pain during exercise.  Outpatient Encounter Medications as of 10/18/2023  Medication Sig   albuterol (PROVENTIL) (2.5 MG/3ML) 0.083% nebulizer solution Take 3 mLs (2.5 mg total) by nebulization every 4 (four) hours as needed for wheezing or shortness of breath.   albuterol (VENTOLIN HFA) 108 (90 Base) MCG/ACT inhaler INHALE 2 PUFFS BY MOUTH INTO THE LUNGS EVERY 4 HOURS AS NEEDED   ascorbic acid (VITAMIN C) 500 MG tablet Take by mouth.   bictegravir-emtricitabine-tenofovir AF (BIKTARVY) 50-200-25 MG TABS tablet TAKE 1 TABLET BY MOUTH DAILY.   Biotin w/ Vitamins C & E (HAIR/SKIN/NAILS PO) Take by mouth.   Blood Pressure Monitoring (BLOOD PRESSURE CUFF) MISC 1 Device by Does not apply route daily. DX: I10   Calcium Carbonate (CALCIUM 600 PO) Take 1 tablet by mouth daily.    Cholecalciferol (VITAMIN D) 125 MCG (5000 UT) CAPS Take by mouth.   ELDERBERRY PO Take by mouth.   enalapril (VASOTEC) 20 MG  tablet Take 1 tablet (20 mg total) by mouth daily.   fluconazole (DIFLUCAN) 150 MG tablet Take 1 tablet by mouth and repeat dose in one week   fluticasone (FLONASE) 50 MCG/ACT nasal spray Place 1 spray into both nostrils daily.   fluticasone-salmeterol (ADVAIR HFA) 115-21 MCG/ACT inhaler Inhale 1 puff into the lungs 2 (two) times daily.   hydrochlorothiazide (HYDRODIURIL) 25 MG tablet Take 1 tablet (25 mg total) by mouth daily.   metoprolol tartrate (LOPRESSOR) 100 MG tablet Take 1 tablet (100 mg total) by mouth daily.   montelukast (SINGULAIR) 10 MG tablet Take 1 tablet (10 mg total) by mouth at bedtime.   Multiple Vitamins-Minerals (MULTIVITAMIN GUMMIES ADULT PO) Take 2 tablets by mouth daily. Gummy   Multiple Vitamins-Minerals (ZINC PO) Take by mouth.   pravastatin (PRAVACHOL) 20 MG tablet Take 1 tablet (20 mg total) by mouth daily.   sertraline (ZOLOFT) 25 MG tablet Take 1 tablet (25 mg total) by mouth daily.   Spacer/Aero-Holding Chambers DEVI Use with advair HFA   SPIKEVAX syringe Inject 0.5 mLs into the muscle once.   traZODone (DESYREL) 50 MG tablet Take 1/2-1 tablet (25-50 mg total) by mouth at bedtime as needed for sleep.   vitamin B-12 (CYANOCOBALAMIN) 500 MCG tablet Take 500 mcg by mouth daily.   [DISCONTINUED] linaclotide (LINZESS) 145 MCG CAPS capsule Take 1 capsule (145 mcg total) by mouth daily before breakfast.   No facility-administered encounter medications on file as of 10/18/2023.     Patient Active Problem List   Diagnosis  Date Noted   Depression, major, single episode, complete remission (HCC) 03/28/2021   Mixed hyperlipidemia 03/28/2021   Primary insomnia 03/28/2021   Depression, major, single episode, moderate (HCC) 03/28/2021   Chronic idiopathic constipation 03/28/2021   Seasonal allergies 01/13/2018   Asthma 02/05/2017   Allergy 12/16/2016   Age related osteoporosis 08/19/2016   Essential hypertension 08/19/2016   Human immunodeficiency virus (HIV) disease  (HCC) 08/19/2016   Hx of adenomatous polyp of colon 2015     Health Maintenance Due  Topic Date Due   COVID-19 Vaccine (6 - 2024-25 season) 09/03/2023     Review of Systems  Physical Exam   BP (!) 155/95   Pulse 64   Temp 98.1 F (36.7 C) (Oral)   Resp 16   Wt 174 lb (78.9 kg)   SpO2 98%   BMI 27.25 kg/m    Lab Results  Component Value Date   CD4TCELL 24 (L) 04/26/2023   Lab Results  Component Value Date   CD4TABS 641 04/26/2023   CD4TABS 620 08/25/2021   CD4TABS 475 01/28/2021   Lab Results  Component Value Date   HIV1RNAQUANT Not Detected 04/26/2023   Lab Results  Component Value Date   HEPBSAB POS (A) 09/24/2016   Lab Results  Component Value Date   LABRPR NON-REACTIVE 09/21/2022    CBC Lab Results  Component Value Date   WBC 6.2 04/19/2023   RBC 4.45 04/19/2023   HGB 13.7 04/19/2023   HCT 40.5 04/19/2023   PLT 254 04/19/2023   MCV 91.0 04/19/2023   MCH 30.8 04/19/2023   MCHC 33.8 04/19/2023   RDW 13.1 04/19/2023   LYMPHSABS 3,410 04/19/2023   MONOABS 0.2 02/03/2020   EOSABS 211 04/19/2023    BMET Lab Results  Component Value Date   NA 140 05/05/2023   K 3.6 05/05/2023   CL 101 05/05/2023   CO2 31 05/05/2023   GLUCOSE 88 05/05/2023   BUN 13 05/05/2023   CREATININE 0.85 05/05/2023   CALCIUM 9.6 05/05/2023   GFRNONAA 64 01/28/2021   GFRAA 75 01/28/2021      Assessment and Plan HIV Well-managed on Biktarvy with no adherence issues or side effects. Discussed ongoing research and future advancements, including potential weekly pill and current bi-monthly injections. Explained that a complete cure is not yet available. - Refill Biktarvy as needed - Discuss advancements in HIV treatment and ongoing research  Hypertension Blood pressure elevated at 150/95 mmHg. Reports adherence to antihypertensive regimen. No previous high readings. - Recheck blood pressure  Urinary Incontinence Reports urgency and leakage without pain or  prolapse. Possible age-related decline in bladder muscle control. Advised regular voiding to prevent overfilling. Discussed urologist referral if symptoms persist. - Advise regular voiding - Refer to urologist if symptoms persist  General Health Maintenance Engages in regular physical activity, walking on treadmill for an hour three days a week. - Encourage continuation of regular physical activity  Follow-up - Schedule follow-up appointment as needed - Order routine labs.  Labs Refills Pcv20 Ref urology htn

## 2023-10-19 ENCOUNTER — Encounter: Payer: Self-pay | Admitting: Family

## 2023-10-19 ENCOUNTER — Ambulatory Visit (INDEPENDENT_AMBULATORY_CARE_PROVIDER_SITE_OTHER): Payer: 59 | Admitting: Family

## 2023-10-19 VITALS — BP 138/78 | HR 65 | Temp 97.0°F | Resp 18 | Ht 67.0 in | Wt 174.0 lb

## 2023-10-19 DIAGNOSIS — E782 Mixed hyperlipidemia: Secondary | ICD-10-CM

## 2023-10-19 DIAGNOSIS — I10 Essential (primary) hypertension: Secondary | ICD-10-CM

## 2023-10-19 DIAGNOSIS — F321 Major depressive disorder, single episode, moderate: Secondary | ICD-10-CM | POA: Diagnosis not present

## 2023-10-19 DIAGNOSIS — N3942 Incontinence without sensory awareness: Secondary | ICD-10-CM

## 2023-10-19 DIAGNOSIS — J302 Other seasonal allergic rhinitis: Secondary | ICD-10-CM | POA: Diagnosis not present

## 2023-10-19 DIAGNOSIS — J4550 Severe persistent asthma, uncomplicated: Secondary | ICD-10-CM | POA: Diagnosis not present

## 2023-10-19 LAB — T-HELPER CELL (CD4) - (RCID CLINIC ONLY)
CD4 % Helper T Cell: 15 % — ABNORMAL LOW (ref 33–65)
CD4 T Cell Abs: 545 /uL (ref 400–1790)

## 2023-10-19 NOTE — Progress Notes (Signed)
 Provider: Roxan Plough FNP-C   Lyndal Reggio, Roxan BROCKS, NP  Patient Care Team: Janet Decesare, Roxan BROCKS, NP as PCP - General (Family Medicine)  Extended Emergency Contact Information Primary Emergency Contact: [facility] Address: [address]          [place], [place] [address] [place] Home Phone: [phone] Relation: Spouse Secondary Emergency Contact: Speas,Rushdan Address: [address]          [place], [place] [address] [place] Home Phone: [phone] Mobile Phone: [phone] Relation: Son  Code Status:  Full code  Goals of care: Advanced Directive information    08/26/2023    3:36 PM  Advanced Directives  Does Patient Have a Medical Advance Directive? No  Would patient like information on creating a medical advance directive? No - Patient declined     Chief Complaint  Patient presents with   Medical Management of Chronic Issues    Patient is being seen for a 6 month follow up    Discussed the use of AI scribe software for clinical note transcription with the patient, who gave verbal consent to proceed.  History of Present Illness   Maria Adkins is a 73 year old female with hypertension who presents for a six-month follow-up.  Her blood pressure readings at home have been fluctuating, with a recent reading of 151/87 at another doctor's appointment. Throughout the day, her blood pressure varied, reaching as low as 138/78 after taking her medication. She is compliant with her medications, which include metoprolol  100 mg daily, hydrochlorothiazide  25 mg, and enalapril  20 mg daily. She checks her blood pressure in the morning after taking her medication. She experiences headaches upon waking but has no chest pain, shortness of breath, or palpitations. She feels tired, attributing this to her fluctuating blood pressure. She is concerned about potential complications if her blood pressure remains high.  She has difficulty falling asleep, staying  up until 1 or 2 AM, and does not feel sleepy. She has stopped taking trazodone  due to morning headaches and does not feel the need for it. She stays up watching TV because she does not feel sleepy.  She has a history of urinary incontinence, describing an inability to hold her urine and experiencing leakage. She manages this by going to the bathroom more frequently. She finds it challenging to avoid caffeine as she enjoys coffee.  She mentions a stressful home environment due to a 55-year-old foster child who is difficult to manage, which may be contributing to her stress levels. She has started going back to the gym and walking on the treadmill for an hour to help manage her stress.  She is currently taking several medications, including sertraline , pravastatin , Singulair , Flonase , Advair  (once daily), calcium , biotin, Biktarvy , and albuterol . She has stopped taking vitamin B12 separately as it is included in her multivitamin. She reports no side effects from her current medications.       Past Medical History:  Diagnosis Date   Allergy    seasonal allergies   Asthma    uses unhaler   Blood transfusion without reported diagnosis 1990's   Cataract    bilateral sx   Depression    on meds   High blood pressure    on meds   HIV (human immunodeficiency virus infection) (HCC)    on meds   Hx of adenomatous polyp of colon 2015   Hyperlipidemia    on meds   Osteoarthritis    Osteopenia    Past  Surgical History:  Procedure Laterality Date   Biopsy of Liver     CATARACT EXTRACTION, BILATERAL     CHOLECYSTECTOMY     COLONOSCOPY  2015   MAC-Dr.Liakos-prep(exc)-TA x 1   CYST REMOVAL HAND Left    CYSTECTOMY     off of back   LAPAROSCOPIC SALPINGOOPHERECTOMY Right    POLYPECTOMY  2015   TA    No Known Allergies  Allergies as of 10/19/2023   No Known Allergies      Medication List        Accurate as of October 19, 2023  8:24 PM. If you have any questions, ask your nurse or  doctor.          STOP taking these medications    cyanocobalamin 500 MCG tablet Commonly known as: VITAMIN B12   fluconazole  150 MG tablet Commonly known as: Diflucan        TAKE these medications    Advair  HFA 115-21 MCG/ACT inhaler Generic drug: fluticasone -salmeterol Inhale 1 puff into the lungs 2 (two) times daily.   albuterol  (2.5 MG/3ML) 0.083% nebulizer solution Commonly known as: PROVENTIL  Take 3 mLs (2.5 mg total) by nebulization every 4 (four) hours as needed for wheezing or shortness of breath.   albuterol  108 (90 Base) MCG/ACT inhaler Commonly known as: VENTOLIN  HFA INHALE 2 PUFFS BY MOUTH INTO THE LUNGS EVERY 4 HOURS AS NEEDED   ascorbic acid 500 MG tablet Commonly known as: VITAMIN C Take by mouth.   Biktarvy  50-200-25 MG Tabs tablet Generic drug: bictegravir-emtricitabine -tenofovir  AF TAKE 1 TABLET BY MOUTH DAILY.   Blood Pressure Cuff Misc 1 Device by Does not apply route daily. DX: I10   CALCIUM  600 PO Take 1 tablet by mouth daily.   [employer] Use with advair  HFA   ELDERBERRY PO Take by mouth.   enalapril  20 MG tablet Commonly known as: Vasotec  Take 1 tablet (20 mg total) by mouth daily.   fluticasone  50 MCG/ACT nasal spray Commonly known as: FLONASE  Place 1 spray into both nostrils daily.   HAIR/SKIN/NAILS PO Take by mouth.   hydrochlorothiazide  25 MG tablet Commonly known as: HYDRODIURIL  Take 1 tablet (25 mg total) by mouth daily.   metoprolol  tartrate 100 MG tablet Commonly known as: LOPRESSOR  Take 1 tablet (100 mg total) by mouth daily.   montelukast  10 MG tablet Commonly known as: SINGULAIR  Take 1 tablet (10 mg total) by mouth at bedtime.   MULTIVITAMIN GUMMIES ADULT PO Take 2 tablets by mouth daily. Gummy   pravastatin  20 MG tablet Commonly known as: PRAVACHOL  Take 1 tablet (20 mg total) by mouth daily.   sertraline  25 MG tablet Commonly known as: ZOLOFT  Take 1 tablet (25 mg total) by mouth  daily.   Spikevax syringe Generic drug: COVID-19 mRNA vaccine Inject 0.5 mLs into the muscle once.   traZODone  50 MG tablet Commonly known as: DESYREL  Take 1/2-1 tablet (25-50 mg total) by mouth at bedtime as needed for sleep.   Vitamin D  125 MCG (5000 UT) Caps Take by mouth.   ZINC PO Take by mouth.        Review of Systems  Constitutional:  Negative for appetite change, chills, fatigue, fever and unexpected weight change.  HENT:  Negative for congestion, dental problem, ear discharge, ear pain, facial swelling, hearing loss, nosebleeds, postnasal drip, rhinorrhea, sinus pressure, sinus pain, sneezing, sore throat, tinnitus and trouble swallowing.   Eyes:  Negative for pain, discharge, redness, itching and visual disturbance.  Respiratory:  Negative for cough, chest tightness, shortness of breath and wheezing.   Cardiovascular:  Negative for chest pain, palpitations and leg swelling.  Gastrointestinal:  Negative for abdominal distention, abdominal pain, blood in stool, constipation, diarrhea, nausea and vomiting.  Endocrine: Negative for cold intolerance, heat intolerance, polydipsia, polyphagia and polyuria.  Genitourinary:  Negative for difficulty urinating, dysuria, flank pain, frequency and urgency.  Musculoskeletal:  Negative for arthralgias, back pain, gait problem, joint swelling, myalgias, neck pain and neck stiffness.  Skin:  Negative for color change, pallor, rash and wound.  Neurological:  Negative for dizziness, syncope, speech difficulty, weakness, light-headedness, numbness and headaches.  Hematological:  Does not bruise/bleed easily.  Psychiatric/Behavioral:  Positive for sleep disturbance. Negative for agitation, behavioral problems, confusion, hallucinations, self-injury and suicidal ideas. The patient is not nervous/anxious.     Immunization History  Administered Date(s) Administered   Fluad  Quad(high Dose 65+) 05/16/2019, 05/31/2020, 06/06/2021, 06/16/2022    Fluad  Trivalent(High Dose 65+) 07/09/2023   Influenza,inj,Quad PF,6+ Mos 06/17/2017, 06/09/2018   Influenza-Unspecified 06/11/2016   Moderna Covid-19 Vaccine Bivalent Booster 54yrs & up 07/12/2021   Moderna Sars-Covid-2 Vaccination 07/06/2020   PFIZER(Purple Top)SARS-COV-2 Vaccination 10/20/2019, 11/14/2019   PNEUMOCOCCAL CONJUGATE-20 10/18/2023   Pfizer(Comirnaty )Fall Seasonal Vaccine 12 years and older 07/09/2023   Pneumococcal Conjugate-13 05/20/2018   Pneumococcal Polysaccharide-23 02/15/2017   Respiratory Syncytial Virus Vaccine ,Recomb Aduvanted(Arexvy ) 09/21/2022   Tdap 09/21/2017   Zoster Recombinant(Shingrix) 07/19/2018, 02/13/2019   Pertinent  Health Maintenance Due  Topic Date Due   MAMMOGRAM  07/11/2025   Colonoscopy  12/17/2027   INFLUENZA VACCINE  Completed   DEXA SCAN  Completed      03/08/2023    8:50 AM 04/19/2023    9:30 AM 04/26/2023    9:46 AM 08/26/2023    3:36 PM 10/18/2023    9:26 AM  Fall Risk  Falls in the past year? 0 0 0 0 0  Was there an injury with Fall? 0 0  0 0  Fall Risk Category Calculator 0 0  0 0  Patient at Risk for Falls Due to  No Fall Risks No Fall Risks No Fall Risks No Fall Risks  Fall risk Follow up  Falls evaluation completed Falls evaluation completed Falls evaluation completed Falls evaluation completed   Functional Status Survey:    Vitals:   10/19/23 1041  BP: 138/78  Pulse: 65  Resp: 18  Temp: (!) 97 F (36.1 C)  SpO2: 99%  Weight: 174 lb (78.9 kg)  Height: 5' 7 (1.702 m)   Body mass index is 27.25 kg/m. Physical Exam  VITALS: P- 65, BP- 138/78, SaO2- 99 MEASUREMENTS: WT- 174 HEENT: Ears bilaterally without erythema or discharge, nasal mucosa without erythema or swelling, oral mucosa without lesions, uvula midline, tongue without lesions, extraocular movements intact, pupillary response to light intact NECK: No cervical lymphadenopathy, thyroid  without enlargement CHEST: Breath sounds clear bilaterally without wheezes,  rales, or rhonchi CARDIOVASCULAR: Heart sounds regular without murmurs, rubs, or gallops ABDOMEN: Soft, non-tender without organomegaly EXTREMITIES: No edema MUSCULOSKELETAL: Full range of motion in lower extremities without tenderness PSYCHIATRY/BEHAVIORAL: Mood stable      Labs reviewed: Recent Labs    04/19/23 0952 05/05/23 0842 10/18/23 0956  NA 141 140 143  K 3.3* 3.6 4.1  CL 99 101 103  CO2 35* 31 27  GLUCOSE 106* 88 96  BUN 18 13 11   CREATININE 0.96 0.85 0.94  CALCIUM  10.2 9.6 9.7   Recent Labs    04/19/23 0952 10/18/23 0956  AST  20 22  ALT 15 19  BILITOT 0.7 0.9  PROT 7.0 6.6   Recent Labs    02/09/23 1518 04/19/23 0952 10/18/23 0956  WBC 9.4 6.2 7.8  NEUTROABS 4,296 1,941 2,691  HGB 14.2 13.7 13.2  HCT 42.3 40.5 40.2  MCV 91.0 91.0 91.2  PLT 237 254 247   Lab Results  Component Value Date   TSH 0.90 04/19/2023   Lab Results  Component Value Date   HGBA1C 5.6 04/19/2023   Lab Results  Component Value Date   CHOL 174 04/19/2023   HDL 37 (L) 04/19/2023   LDLCALC 107 (H) 04/19/2023   TRIG 178 (H) 04/19/2023   CHOLHDL 4.7 04/19/2023    Significant Diagnostic Results in last 30 days:  No results found.  Assessment/Plan     Hypertension Presents for six-month follow-up. Home blood pressure readings elevated (148/93 to 150/87). In-office reading 138/78. On metoprolol  100 mg, hydrochlorothiazide  25 mg, and enalapril  20 mg daily. Reports morning headaches, no chest pain, shortness of breath, or palpitations. Discussed importance of checking blood pressure one hour post-medication. Reluctant to add another antihypertensive. Discussed adding amlodipine if needed. Goal: maintain BP <140/90 to reduce stroke risk. - Monitor blood pressure at home for two weeks, morning and evening. - Send blood pressure readings via MyChart. - Consider adding amlodipine if blood pressure remains elevated.  Insomnia Difficulty sleeping, stays up until 1-2 AM watching  TV. Discontinued trazodone  50 mg due to morning headaches. Discussed importance of 6-8 hours of sleep for overall health, including blood pressure management. Emphasized impact of poor sleep on memory and blood pressure. - Encourage sleep hygiene: reduce screen time before bed, create dark, quiet sleep environment. - Consider non-pharmacological interventions for sleep.  Urinary Incontinence Reports urgency and accidents. Advised to try scheduled voiding. No burning or itching. Discussed impact of caffeine on bladder function and benefits of Kegel exercises. - Refer to urologist for further evaluation. - Encourage Kegel exercises. - Advise avoiding caffeine.  Hyperlipidemia On pravastatin  20 mg. Last cholesterol: triglycerides 178, LDL 107. Discussed dietary modifications to reduce starchy foods, sweets, and fatty foods. Emphasized importance of reducing triglycerides and LDL to lower cardiovascular risk. - Order lipid panel to re-evaluate cholesterol levels.  Asthma Uses Advair  once daily. No recent wheezing. - Continue Advair  once daily.  Seasonal Allergic Rhinitis Uses Flonase  and Singulair . Well-controlled, no recent nasal congestion. - Continue Flonase  and Singulair .  General Health Maintenance Up to date on immunizations. Received flu shot, pneumococcal vaccine, and COVID-19 vaccine in October 2024. Tetanus vaccine in 2019. Mammogram due in October. - Update records with recent COVID-19 vaccine. - Schedule mammogram for October.  Follow-up - Schedule six-month follow-up appointment. - Call if there are any issues with lab results.   Family/ staff Communication: Reviewed plan of care with patient verbalized understanding   Labs/tests ordered:  Lipid panel  TSH level   Next Appointment : Return in about 6 months (around 04/17/2024) for medical mangement of chronic issues.SABRA   Spent 30 minutes of Face to face with patient  >50% time spent counseling; reviewing medical record;  tests; labs; documentation and developing future plan of care.   Roxan JAYSON Plough, NP

## 2023-10-20 LAB — CBC WITH DIFFERENTIAL/PLATELET
Absolute Lymphocytes: 4134 {cells}/uL — ABNORMAL HIGH (ref 850–3900)
Absolute Monocytes: 585 {cells}/uL (ref 200–950)
Basophils Absolute: 70 {cells}/uL (ref 0–200)
Basophils Relative: 0.9 %
Eosinophils Absolute: 320 {cells}/uL (ref 15–500)
Eosinophils Relative: 4.1 %
HCT: 40.2 % (ref 35.0–45.0)
Hemoglobin: 13.2 g/dL (ref 11.7–15.5)
MCH: 29.9 pg (ref 27.0–33.0)
MCHC: 32.8 g/dL (ref 32.0–36.0)
MCV: 91.2 fL (ref 80.0–100.0)
MPV: 10.2 fL (ref 7.5–12.5)
Monocytes Relative: 7.5 %
Neutro Abs: 2691 {cells}/uL (ref 1500–7800)
Neutrophils Relative %: 34.5 %
Platelets: 247 10*3/uL (ref 140–400)
RBC: 4.41 10*6/uL (ref 3.80–5.10)
RDW: 13.2 % (ref 11.0–15.0)
Total Lymphocyte: 53 %
WBC: 7.8 10*3/uL (ref 3.8–10.8)

## 2023-10-20 LAB — COMPLETE METABOLIC PANEL WITH GFR
AG Ratio: 1.6 (calc) (ref 1.0–2.5)
ALT: 19 U/L (ref 6–29)
AST: 22 U/L (ref 10–35)
Albumin: 4.1 g/dL (ref 3.6–5.1)
Alkaline phosphatase (APISO): 60 U/L (ref 37–153)
BUN: 11 mg/dL (ref 7–25)
CO2: 27 mmol/L (ref 20–32)
Calcium: 9.7 mg/dL (ref 8.6–10.4)
Chloride: 103 mmol/L (ref 98–110)
Creat: 0.94 mg/dL (ref 0.60–1.00)
Globulin: 2.5 g/dL (ref 1.9–3.7)
Glucose, Bld: 96 mg/dL (ref 65–99)
Potassium: 4.1 mmol/L (ref 3.5–5.3)
Sodium: 143 mmol/L (ref 135–146)
Total Bilirubin: 0.9 mg/dL (ref 0.2–1.2)
Total Protein: 6.6 g/dL (ref 6.1–8.1)
eGFR: 64 mL/min/{1.73_m2} (ref >=60)

## 2023-10-20 LAB — LIPID PANEL
Cholesterol: 203 mg/dL — ABNORMAL HIGH (ref <200)
HDL: 48 mg/dL — ABNORMAL LOW (ref >=50)
LDL Cholesterol (Calc): 126 mg/dL — ABNORMAL HIGH
Non-HDL Cholesterol (Calc): 155 mg/dL — ABNORMAL HIGH (ref <130)
Total CHOL/HDL Ratio: 4.2 (calc) (ref <5.0)
Triglycerides: 171 mg/dL — ABNORMAL HIGH (ref <150)

## 2023-10-20 LAB — HIV-1 RNA QUANT-NO REFLEX-BLD
HIV 1 RNA Quant: <20 {copies}/mL — ABNORMAL HIGH
HIV-1 RNA Quant, Log: <1.3 {Log} — ABNORMAL HIGH

## 2023-10-20 LAB — RPR: RPR Ser Ql: NONREACTIVE

## 2023-10-20 LAB — TSH: TSH: 1.25 m[IU]/L (ref 0.40–4.50)

## 2023-10-21 ENCOUNTER — Other Ambulatory Visit (HOSPITAL_BASED_OUTPATIENT_CLINIC_OR_DEPARTMENT_OTHER): Payer: Self-pay

## 2023-10-21 ENCOUNTER — Other Ambulatory Visit: Payer: Self-pay

## 2023-10-21 DIAGNOSIS — E782 Mixed hyperlipidemia: Secondary | ICD-10-CM

## 2023-10-21 MED ORDER — FENOFIBRATE 48 MG PO TABS
48.0000 mg | ORAL_TABLET | Freq: Every day | ORAL | 1 refills | Status: DC
  Filled 2023-10-21: qty 30, 30d supply, fill #0
  Filled 2023-11-18: qty 30, 30d supply, fill #1

## 2023-11-02 ENCOUNTER — Other Ambulatory Visit (HOSPITAL_BASED_OUTPATIENT_CLINIC_OR_DEPARTMENT_OTHER): Payer: Self-pay

## 2023-11-03 ENCOUNTER — Other Ambulatory Visit: Payer: Self-pay

## 2023-11-03 ENCOUNTER — Other Ambulatory Visit: Payer: Self-pay | Admitting: Internal Medicine

## 2023-11-03 DIAGNOSIS — B20 Human immunodeficiency virus [HIV] disease: Secondary | ICD-10-CM

## 2023-11-03 MED ORDER — BIKTARVY 50-200-25 MG PO TABS
1.0000 | ORAL_TABLET | Freq: Every day | ORAL | 4 refills | Status: DC
  Filled 2023-11-03: qty 30, 30d supply, fill #0
  Filled 2023-12-06: qty 30, 30d supply, fill #1
  Filled 2024-01-07: qty 30, 30d supply, fill #2
  Filled 2024-02-02: qty 30, 30d supply, fill #3
  Filled 2024-03-06: qty 30, 30d supply, fill #4

## 2023-11-03 NOTE — Progress Notes (Signed)
Specialty Pharmacy Ongoing Clinical Assessment Note  Maria Adkins is a 73 y.o. female who is being followed by the specialty pharmacy service for RxSp HIV   Patient's specialty medication(s) reviewed today: Bictegravir-Emtricitab-Tenofov (Biktarvy)   Missed doses in the last 4 weeks: 0   Patient/Caregiver did not have any additional questions or concerns.   Therapeutic benefit summary: Patient is achieving benefit   Adverse events/side effects summary: No adverse events/side effects   Patient's therapy is appropriate to: Continue    Goals Addressed             This Visit's Progress    Achieve Undetectable HIV Viral Load < 20       Patient is on track. Patient will maintain adherence. Viral load remains undetectable long term.          Follow up:  6 months  Otto Herb Specialty Pharmacist

## 2023-11-03 NOTE — Progress Notes (Signed)
Specialty Pharmacy Refill Coordination Note  Maria Adkins is a 73 y.o. female contacted today regarding refills of specialty medication(s) Bictegravir-Emtricitab-Tenofov Musician)   Patient requested Delivery   Delivery date: 11/11/23   Verified address: [address]  [place] [address]   Medication will be filled on 11/10/23.

## 2023-11-08 ENCOUNTER — Other Ambulatory Visit (HOSPITAL_BASED_OUTPATIENT_CLINIC_OR_DEPARTMENT_OTHER): Payer: Self-pay

## 2023-11-10 ENCOUNTER — Other Ambulatory Visit: Payer: Self-pay

## 2023-11-18 ENCOUNTER — Other Ambulatory Visit (HOSPITAL_BASED_OUTPATIENT_CLINIC_OR_DEPARTMENT_OTHER): Payer: Self-pay

## 2023-12-03 ENCOUNTER — Other Ambulatory Visit: Payer: Self-pay

## 2023-12-03 ENCOUNTER — Other Ambulatory Visit (HOSPITAL_BASED_OUTPATIENT_CLINIC_OR_DEPARTMENT_OTHER): Payer: Self-pay

## 2023-12-06 ENCOUNTER — Other Ambulatory Visit: Payer: Self-pay | Admitting: Pharmacy Technician

## 2023-12-06 ENCOUNTER — Other Ambulatory Visit: Payer: Self-pay

## 2023-12-06 ENCOUNTER — Other Ambulatory Visit (HOSPITAL_BASED_OUTPATIENT_CLINIC_OR_DEPARTMENT_OTHER): Payer: Self-pay

## 2023-12-06 NOTE — Progress Notes (Signed)
 Specialty Pharmacy Refill Coordination Note  Maria Adkins is a 73 y.o. female contacted today regarding refills of specialty medication(s) Bictegravir-Emtricitab-Tenofov Musician)   Patient requested Delivery   Delivery date: 12/10/23   Verified address: [address]  [place]   Medication will be filled on 12/09/23.

## 2023-12-09 ENCOUNTER — Other Ambulatory Visit: Payer: Self-pay

## 2023-12-29 ENCOUNTER — Other Ambulatory Visit (HOSPITAL_BASED_OUTPATIENT_CLINIC_OR_DEPARTMENT_OTHER): Payer: Self-pay

## 2024-01-07 ENCOUNTER — Other Ambulatory Visit: Payer: Self-pay | Admitting: Pharmacy Technician

## 2024-01-07 ENCOUNTER — Other Ambulatory Visit: Payer: Self-pay

## 2024-01-07 NOTE — Progress Notes (Signed)
 Specialty Pharmacy Refill Coordination Note  Maria Adkins is a 73 y.o. female contacted today regarding refills of specialty medication(s) Bictegravir-Emtricitab-Tenofov (Biktarvy )   Patient requested (Patient-Rptd) Delivery   Delivery date: (Patient-Rptd) 01/14/24   Verified address: (Patient-Rptd) [address] [place], [place] [address]   Medication will be filled on 01/13/24.

## 2024-01-08 ENCOUNTER — Other Ambulatory Visit: Payer: Self-pay

## 2024-01-13 ENCOUNTER — Other Ambulatory Visit: Payer: Self-pay

## 2024-01-14 ENCOUNTER — Other Ambulatory Visit: Payer: Self-pay

## 2024-01-16 ENCOUNTER — Other Ambulatory Visit: Payer: Self-pay | Admitting: Family

## 2024-01-17 ENCOUNTER — Other Ambulatory Visit (HOSPITAL_BASED_OUTPATIENT_CLINIC_OR_DEPARTMENT_OTHER): Payer: Self-pay

## 2024-01-17 MED ORDER — METOPROLOL TARTRATE 100 MG PO TABS
100.0000 mg | ORAL_TABLET | Freq: Every day | ORAL | 1 refills | Status: DC
Start: 2024-01-17 — End: 2024-07-12
  Filled 2024-01-17: qty 90, 90d supply, fill #0
  Filled 2024-04-12: qty 90, 90d supply, fill #1

## 2024-01-25 ENCOUNTER — Ambulatory Visit (INDEPENDENT_AMBULATORY_CARE_PROVIDER_SITE_OTHER): Payer: PPO | Admitting: Otolaryngology

## 2024-01-25 NOTE — Progress Notes (Signed)
 The 10-year ASCVD risk score (Arnett DK, et al., 2019) is: 15.8%   Values used to calculate the score:     Age: 73 years     Sex: Female     Is Non-Hispanic African American: Yes     Diabetic: No     Tobacco smoker: No     Systolic Blood Pressure: 138 mmHg     Is BP treated: Yes     HDL Cholesterol: 48 mg/dL     Total Cholesterol: 203 mg/dL  Arlon Bergamo, BSN, RN

## 2024-01-31 ENCOUNTER — Other Ambulatory Visit (HOSPITAL_BASED_OUTPATIENT_CLINIC_OR_DEPARTMENT_OTHER): Payer: Self-pay

## 2024-02-01 ENCOUNTER — Other Ambulatory Visit: Payer: Self-pay

## 2024-02-02 ENCOUNTER — Other Ambulatory Visit: Payer: Self-pay

## 2024-02-02 NOTE — Progress Notes (Signed)
 Specialty Pharmacy Refill Coordination Note  Maria Adkins is a 73 y.o. female contacted today regarding refills of specialty medication(s) Bictegravir-Emtricitab-Tenofov (Biktarvy )   Patient requested (Patient-Rptd) Delivery   Delivery date: (Patient-Rptd) 02/11/24   Verified address: (Patient-Rptd) [address] [place], [place] [address]   Medication will be filled on 02/10/24.

## 2024-02-03 ENCOUNTER — Other Ambulatory Visit (HOSPITAL_BASED_OUTPATIENT_CLINIC_OR_DEPARTMENT_OTHER): Payer: Self-pay

## 2024-02-14 ENCOUNTER — Ambulatory Visit: Payer: PPO | Admitting: Internal Medicine

## 2024-02-14 ENCOUNTER — Other Ambulatory Visit: Payer: PPO

## 2024-02-14 DIAGNOSIS — E782 Mixed hyperlipidemia: Secondary | ICD-10-CM

## 2024-02-15 ENCOUNTER — Ambulatory Visit: Payer: Self-pay | Admitting: Family

## 2024-02-15 LAB — LIPID PANEL
Cholesterol: 209 mg/dL — ABNORMAL HIGH (ref <200)
HDL: 51 mg/dL (ref >=50)
LDL Cholesterol (Calc): 129 mg/dL — ABNORMAL HIGH
Non-HDL Cholesterol (Calc): 158 mg/dL — ABNORMAL HIGH (ref <130)
Total CHOL/HDL Ratio: 4.1 (calc) (ref <5.0)
Triglycerides: 173 mg/dL — ABNORMAL HIGH (ref <150)

## 2024-02-21 ENCOUNTER — Other Ambulatory Visit (HOSPITAL_BASED_OUTPATIENT_CLINIC_OR_DEPARTMENT_OTHER): Payer: Self-pay

## 2024-02-21 ENCOUNTER — Other Ambulatory Visit: Payer: Self-pay

## 2024-02-21 MED ORDER — FENOFIBRATE 48 MG PO TABS
48.0000 mg | ORAL_TABLET | Freq: Every day | ORAL | 1 refills | Status: AC
  Filled 2024-02-21: qty 30, 30d supply, fill #0
  Filled 2024-03-15 – 2024-03-16 (×2): qty 30, 30d supply, fill #1

## 2024-02-27 ENCOUNTER — Other Ambulatory Visit: Payer: Self-pay | Admitting: Family

## 2024-02-28 ENCOUNTER — Other Ambulatory Visit (HOSPITAL_BASED_OUTPATIENT_CLINIC_OR_DEPARTMENT_OTHER): Payer: Self-pay

## 2024-02-28 MED ORDER — ENALAPRIL MALEATE 20 MG PO TABS
20.0000 mg | ORAL_TABLET | Freq: Every day | ORAL | 1 refills | Status: DC
  Filled 2024-02-28: qty 90, 90d supply, fill #0
  Filled 2024-05-25: qty 90, 90d supply, fill #1

## 2024-03-01 ENCOUNTER — Ambulatory Visit: Admitting: Internal Medicine

## 2024-03-01 ENCOUNTER — Encounter: Payer: Self-pay | Admitting: Internal Medicine

## 2024-03-01 ENCOUNTER — Other Ambulatory Visit: Payer: Self-pay

## 2024-03-01 VITALS — BP 118/87 | HR 77 | Resp 16 | Ht 67.0 in | Wt 171.4 lb

## 2024-03-01 DIAGNOSIS — F321 Major depressive disorder, single episode, moderate: Secondary | ICD-10-CM

## 2024-03-01 DIAGNOSIS — T148XXA Other injury of unspecified body region, initial encounter: Secondary | ICD-10-CM | POA: Diagnosis not present

## 2024-03-01 DIAGNOSIS — G4700 Insomnia, unspecified: Secondary | ICD-10-CM | POA: Diagnosis not present

## 2024-03-01 DIAGNOSIS — B20 Human immunodeficiency virus [HIV] disease: Secondary | ICD-10-CM | POA: Diagnosis not present

## 2024-03-01 DIAGNOSIS — R222 Localized swelling, mass and lump, trunk: Secondary | ICD-10-CM | POA: Diagnosis not present

## 2024-03-01 DIAGNOSIS — Z79899 Other long term (current) drug therapy: Secondary | ICD-10-CM

## 2024-03-01 DIAGNOSIS — F32A Depression, unspecified: Secondary | ICD-10-CM | POA: Diagnosis not present

## 2024-03-01 NOTE — Progress Notes (Signed)
 RFV: follow up for hiv disease  [name], female   DOB: Apr 11, 1951, 73 y.o.   MRN: [id number]  HPI  Maria Adkins is a 73yo F with well controlled hiv disease, on biktarvy .  Feeling depressed - since her daughter wants to be estranged from her. Initially going to counseling Q 3 wk but the last session her daughter wanted to quit counseling and being done with the relationship with her mom. Her daughter has alcohol dependence. The difficult part is that she is also estranged from her grandkids/daughter's kids   ROS: she is noticing easy bruising   Outpouching on right side of abdomen; growing, unclear what it ?  Graduates from [employer]ible [employer] this week and family coming  Outpatient Encounter Medications as of 03/01/2024  Medication Sig   albuterol  (PROVENTIL ) (2.5 MG/3ML) 0.083% nebulizer solution Take 3 mLs (2.5 mg total) by nebulization every 4 (four) hours as needed for wheezing or shortness of breath.   albuterol  (VENTOLIN  HFA) 108 (90 Base) MCG/ACT inhaler INHALE 2 PUFFS BY MOUTH INTO THE LUNGS EVERY 4 HOURS AS NEEDED   ascorbic acid (VITAMIN C) 500 MG tablet Take by mouth.   bictegravir-emtricitabine -tenofovir  AF (BIKTARVY ) 50-200-25 MG TABS tablet TAKE 1 TABLET BY MOUTH DAILY.   Biotin w/ Vitamins C & E (HAIR/SKIN/NAILS PO) Take by mouth.   Blood Pressure Monitoring (BLOOD PRESSURE CUFF) MISC 1 Device by Does not apply route daily. DX: I10   Calcium  Carbonate (CALCIUM  600 PO) Take 1 tablet by mouth daily.    Cholecalciferol (VITAMIN D ) 125 MCG (5000 UT) CAPS Take by mouth.   ELDERBERRY PO Take by mouth.   enalapril  (VASOTEC ) 20 MG tablet Take 1 tablet (20 mg total) by mouth daily.   fenofibrate  (TRICOR ) 48 MG tablet Take 1 tablet (48 mg total) by mouth daily.   fluticasone  (FLONASE ) 50 MCG/ACT nasal spray Place 1 spray into both nostrils daily.   fluticasone -salmeterol (ADVAIR  HFA) 115-21 MCG/ACT inhaler Inhale 1 puff into the lungs 2 (two) times daily.    hydrochlorothiazide  (HYDRODIURIL ) 25 MG tablet Take 1 tablet (25 mg total) by mouth daily.   metoprolol  tartrate (LOPRESSOR ) 100 MG tablet Take 1 tablet (100 mg total) by mouth daily.   montelukast  (SINGULAIR ) 10 MG tablet Take 1 tablet (10 mg total) by mouth at bedtime.   Multiple Vitamins-Minerals (MULTIVITAMIN GUMMIES ADULT PO) Take 2 tablets by mouth daily. Gummy   Multiple Vitamins-Minerals (ZINC PO) Take by mouth.   pravastatin  (PRAVACHOL ) 20 MG tablet Take 1 tablet (20 mg total) by mouth daily.   sertraline  (ZOLOFT ) 25 MG tablet Take 1 tablet (25 mg total) by mouth daily.   Spacer/Aero-Holding Chambers [employer] Use with advair  HFA   SPIKEVAX syringe Inject 0.5 mLs into the muscle once.   traZODone  (DESYREL ) 50 MG tablet Take 1/2-1 tablet (25-50 mg total) by mouth at bedtime as needed for sleep.   [DISCONTINUED] linaclotide  (LINZESS ) 145 MCG CAPS capsule Take 1 capsule (145 mcg total) by mouth daily before breakfast.   No facility-administered encounter medications on file as of 03/01/2024.     Patient Active Problem List   Diagnosis Date Noted   Severe persistent asthma, unspecified whether complicated 10/19/2023   Depression, major, single episode, complete remission (HCC) 03/28/2021   Mixed hyperlipidemia 03/28/2021   Primary insomnia 03/28/2021   Depression, major, single episode, moderate (HCC) 03/28/2021   Chronic idiopathic constipation 03/28/2021   Seasonal allergies 01/13/2018   Asthma 02/05/2017   Allergy 12/16/2016   Age related osteoporosis  08/19/2016   Essential hypertension 08/19/2016   Human immunodeficiency virus (HIV) disease (HCC) 08/19/2016   Hx of adenomatous polyp of colon 2015     Health Maintenance Due  Topic Date Due   COVID-19 Vaccine (6 - Mixed Product risk 2024-25 season) 01/07/2024     Review of Systems 12 point ros is negative, except for situational depression Physical Exam   BP 118/87   Pulse 77   Resp 16   Ht 5' 7 (1.702 m)   Wt 171 lb  6.4 oz (77.7 kg)   SpO2 96%   BMI 26.85 kg/m   Physical Exam  Constitutional:  oriented to person, place, and time. appears well-developed and well-nourished. No distress.  HENT: [place]/AT, PERRLA, no scleral icterus Mouth/Throat: Oropharynx is clear and moist. No oropharyngeal exudate.  Cardiovascular: Normal rate, regular rhythm and normal heart sounds. Exam reveals no gallop and no friction rub.  No murmur heard.  Pulmonary/Chest: Effort normal and breath sounds normal. No respiratory distress.  has no wheezes.  Neck = supple, no nuchal rigidity Lymphadenopathy: no cervical adenopathy. No axillary adenopathy Neurological: alert and oriented to person, place, and time.  Skin: Skin is warm and dry. No rash noted. No erythema.  Psychiatric: tearful today   Lab Results  Component Value Date   CD4TCELL 15 (L) 10/18/2023   Lab Results  Component Value Date   CD4TABS 545 10/18/2023   CD4TABS 641 04/26/2023   CD4TABS 620 08/25/2021   Lab Results  Component Value Date   HIV1RNAQUANT <20 (H) 10/18/2023   Lab Results  Component Value Date   HEPBSAB POS (A) 09/24/2016   Lab Results  Component Value Date   LABRPR NON-REACTIVE 10/18/2023    CBC Lab Results  Component Value Date   WBC 7.8 10/18/2023   RBC 4.41 10/18/2023   HGB 13.2 10/18/2023   HCT 40.2 10/18/2023   PLT 247 10/18/2023   MCV 91.2 10/18/2023   MCH 29.9 10/18/2023   MCHC 32.8 10/18/2023   RDW 13.2 10/18/2023   LYMPHSABS 3,410 04/19/2023   MONOABS 0.2 02/03/2020   EOSABS 320 10/18/2023    BMET Lab Results  Component Value Date   NA 143 10/18/2023   K 4.1 10/18/2023   CL 103 10/18/2023   CO2 27 10/18/2023   GLUCOSE 96 10/18/2023   BUN 11 10/18/2023   CREATININE 0.94 10/18/2023   CALCIUM  9.7 10/18/2023   GFRNONAA 64 01/28/2021   GFRAA 75 01/28/2021      Assessment and Plan   abdominal wall mass, - will get abd ct to evaluation  Insomnia = stress from family; will try melatonin 3mg - 5mg     Easy bruising = will check plt count as its included wit hcbc  Hiv disease = will check labs to see that she is well controlled still undetectable VL  Depression = recommendation talk counselor for one on one.

## 2024-03-03 LAB — CBC WITH DIFFERENTIAL/PLATELET
Absolute Lymphocytes: 4454 {cells}/uL — ABNORMAL HIGH (ref 850–3900)
Absolute Monocytes: 640 {cells}/uL (ref 200–950)
Basophils Absolute: 47 {cells}/uL (ref 0–200)
Basophils Relative: 0.6 %
Eosinophils Absolute: 312 {cells}/uL (ref 15–500)
Eosinophils Relative: 4 %
HCT: 42.4 % (ref 35.0–45.0)
Hemoglobin: 13.9 g/dL (ref 11.7–15.5)
MCH: 30.5 pg (ref 27.0–33.0)
MCHC: 32.8 g/dL (ref 32.0–36.0)
MCV: 93 fL (ref 80.0–100.0)
MPV: 10 fL (ref 7.5–12.5)
Monocytes Relative: 8.2 %
Neutro Abs: 2348 {cells}/uL (ref 1500–7800)
Neutrophils Relative %: 30.1 %
Platelets: 240 10*3/uL (ref 140–400)
RBC: 4.56 10*6/uL (ref 3.80–5.10)
RDW: 13.1 % (ref 11.0–15.0)
Total Lymphocyte: 57.1 %
WBC: 7.8 10*3/uL (ref 3.8–10.8)

## 2024-03-03 LAB — COMPLETE METABOLIC PANEL WITHOUT GFR
AG Ratio: 1.8 (calc) (ref 1.0–2.5)
ALT: 17 U/L (ref 6–29)
AST: 21 U/L (ref 10–35)
Albumin: 4.4 g/dL (ref 3.6–5.1)
Alkaline phosphatase (APISO): 55 U/L (ref 37–153)
BUN/Creatinine Ratio: 17 (calc) (ref 6–22)
BUN: 17 mg/dL (ref 7–25)
CO2: 33 mmol/L — ABNORMAL HIGH (ref 20–32)
Calcium: 10 mg/dL (ref 8.6–10.4)
Chloride: 101 mmol/L (ref 98–110)
Creat: 1.02 mg/dL — ABNORMAL HIGH (ref 0.60–1.00)
Globulin: 2.4 g/dL (ref 1.9–3.7)
Glucose, Bld: 85 mg/dL (ref 65–99)
Potassium: 3.6 mmol/L (ref 3.5–5.3)
Sodium: 141 mmol/L (ref 135–146)
Total Bilirubin: 0.8 mg/dL (ref 0.2–1.2)
Total Protein: 6.8 g/dL (ref 6.1–8.1)

## 2024-03-03 LAB — HIV-1 RNA QUANT-NO REFLEX-BLD
HIV 1 RNA Quant: <20 {copies}/mL — AB
HIV-1 RNA Quant, Log: <1.3 {Log_copies}/mL — AB

## 2024-03-03 LAB — T-HELPER CELLS (CD4) COUNT (NOT AT ARMC)
Absolute CD4: 601 {cells}/uL (ref 490–1740)
CD4 T Helper %: 14 % — ABNORMAL LOW (ref 30–61)
Total lymphocyte count: 4234 {cells}/uL — ABNORMAL HIGH (ref 850–3900)

## 2024-03-06 ENCOUNTER — Encounter (INDEPENDENT_AMBULATORY_CARE_PROVIDER_SITE_OTHER): Payer: Self-pay

## 2024-03-06 ENCOUNTER — Other Ambulatory Visit: Payer: Self-pay

## 2024-03-06 ENCOUNTER — Other Ambulatory Visit (HOSPITAL_BASED_OUTPATIENT_CLINIC_OR_DEPARTMENT_OTHER): Payer: Self-pay

## 2024-03-06 ENCOUNTER — Other Ambulatory Visit: Payer: Self-pay | Admitting: Family

## 2024-03-06 MED ORDER — HYDROCHLOROTHIAZIDE 25 MG PO TABS
25.0000 mg | ORAL_TABLET | Freq: Every day | ORAL | 1 refills | Status: DC
  Filled 2024-03-06: qty 90, 90d supply, fill #0
  Filled 2024-06-03: qty 90, 90d supply, fill #1

## 2024-03-06 NOTE — Progress Notes (Signed)
 Specialty Pharmacy Refill Coordination Note  Maria Adkins is a 73 y.o. female contacted today regarding refills of specialty medication(s) Bictegravir-Emtricitab-Tenofov (Biktarvy )   Patient requested (Patient-Rptd) Delivery   Delivery date: 03/07/24   Verified address: (Patient-Rptd) [address], [place], [place] [address]   Medication will be filled on 06.23.25.

## 2024-03-13 ENCOUNTER — Ambulatory Visit (HOSPITAL_COMMUNITY)
Admission: RE | Admit: 2024-03-13 | Discharge: 2024-03-13 | Disposition: A | Discharge status: Home / Self Care | Source: Ambulatory Visit | Attending: Internal Medicine | Admitting: Internal Medicine

## 2024-03-13 ENCOUNTER — Encounter (HOSPITAL_COMMUNITY): Payer: Self-pay

## 2024-03-13 ENCOUNTER — Ambulatory Visit (HOSPITAL_COMMUNITY)

## 2024-03-13 DIAGNOSIS — R1900 Intra-abdominal and pelvic swelling, mass and lump, unspecified site: Secondary | ICD-10-CM | POA: Diagnosis not present

## 2024-03-13 DIAGNOSIS — R222 Localized swelling, mass and lump, trunk: Secondary | ICD-10-CM | POA: Diagnosis present

## 2024-03-13 DIAGNOSIS — K429 Umbilical hernia without obstruction or gangrene: Secondary | ICD-10-CM | POA: Diagnosis not present

## 2024-03-13 MED ORDER — IOHEXOL 300 MG/ML  SOLN
100.0000 mL | Freq: Once | INTRAMUSCULAR | Status: AC | PRN
  Administered 2024-03-13: 100 mL via INTRAVENOUS

## 2024-03-15 ENCOUNTER — Other Ambulatory Visit (HOSPITAL_BASED_OUTPATIENT_CLINIC_OR_DEPARTMENT_OTHER): Payer: Self-pay

## 2024-04-05 ENCOUNTER — Other Ambulatory Visit (HOSPITAL_COMMUNITY): Payer: Self-pay

## 2024-04-05 ENCOUNTER — Encounter (INDEPENDENT_AMBULATORY_CARE_PROVIDER_SITE_OTHER): Payer: Self-pay

## 2024-04-05 ENCOUNTER — Other Ambulatory Visit: Payer: Self-pay

## 2024-04-05 ENCOUNTER — Other Ambulatory Visit: Payer: Self-pay | Admitting: Internal Medicine

## 2024-04-05 DIAGNOSIS — B20 Human immunodeficiency virus [HIV] disease: Secondary | ICD-10-CM

## 2024-04-05 MED ORDER — BIKTARVY 50-200-25 MG PO TABS
1.0000 | ORAL_TABLET | Freq: Every day | ORAL | 4 refills | Status: DC
  Filled 2024-04-05 – 2024-04-06 (×2): qty 30, 30d supply, fill #0
  Filled 2024-04-26 (×2): qty 30, 30d supply, fill #1
  Filled 2024-06-06: qty 30, 30d supply, fill #2
  Filled 2024-07-04: qty 30, 30d supply, fill #3
  Filled 2024-08-02: qty 30, 30d supply, fill #4

## 2024-04-06 ENCOUNTER — Other Ambulatory Visit: Payer: Self-pay

## 2024-04-06 ENCOUNTER — Encounter: Payer: Self-pay | Admitting: Family

## 2024-04-06 ENCOUNTER — Ambulatory Visit: Admitting: Family

## 2024-04-06 VITALS — BP 128/76 | HR 68 | Temp 97.8°F | Resp 20 | Ht 67.0 in | Wt 174.6 lb

## 2024-04-06 DIAGNOSIS — I10 Essential (primary) hypertension: Secondary | ICD-10-CM | POA: Diagnosis not present

## 2024-04-06 DIAGNOSIS — E782 Mixed hyperlipidemia: Secondary | ICD-10-CM

## 2024-04-06 DIAGNOSIS — R059 Cough, unspecified: Secondary | ICD-10-CM | POA: Diagnosis not present

## 2024-04-06 DIAGNOSIS — J4550 Severe persistent asthma, uncomplicated: Secondary | ICD-10-CM | POA: Diagnosis not present

## 2024-04-06 NOTE — Patient Instructions (Signed)
-   continue on Mucinex  - increase fluid intake  - Notify provider if symptoms worsen or fail to improve

## 2024-04-06 NOTE — Progress Notes (Signed)
 Provider: Roxan Plough FNP-C  Anjelina Dung, Roxan BROCKS, NP  Patient Care Team: Bethzy Hauck, Roxan BROCKS, NP as PCP - General (Family Medicine)  Extended Emergency Contact Information Primary Emergency Contact: [facility] Address: [address]          [place], [place] [address] [place] Home Phone: [phone] Relation: Spouse Secondary Emergency Contact: Penn,Rushdan Address: [address]          [place], [place] [address] [place] Home Phone: [phone] Mobile Phone: [phone] Relation: Son  Code Status: Full Code  Goals of care: Advanced Directive information    04/06/2024   12:49 PM  Advanced Directives  Does Patient Have a Medical Advance Directive? No  Would patient like information on creating a medical advance directive? No - Patient declined     Chief Complaint  Patient presents with   Cough    Discussed the use of AI scribe software for clinical note transcription with the patient, who gave verbal consent to proceed.  History of Present Illness   Maria Adkins is a 73 year old female with asthma who presents with a persistent cough and respiratory symptoms.  She developed a cough on Wednesday after returning from a vacation, which she believes she caught from her sister who had similar symptoms. Her sister had a cough, sore throat, and ear pain, and was later diagnosed with a viral infection after testing negative for COVID-19. Her symptoms began with a cough and congestion, followed by changes in her breathing, although she did not experience an asthma attack or wheezing.  She started taking Mucinex tablets, which helped reduce the frequency of her cough and improved her sleep. Initially, she had difficulty sleeping due to the cough, but after starting Mucinex, she was able to sleep through the night. She has also been using her nasal spray and both her rescue and regular inhalers, which have helped her breathe better. She notes that  she hasn't needed her rescue inhaler for years until now.  No fever, nausea, vomiting, or wheezing. Appetite remains good, and she reports no tenderness in her forehead or other areas. She mentions feeling a lack of energy recently, which prompted her husband to make the appointment for her.  Her current medications include Mucinex as needed, a nasal spray, a rescue inhaler, a regular inhaler, and she has been taking ibuprofen instead of Tylenol . She also takes a Centrum Senior multivitamin, elderberry, vitamin D , and pravastatin  for cholesterol. She stopped taking calcium  carbonate and citrulline (Zoloft ) for anxiety and depression, as she felt she did not need them.  She [place] in an area [place] sidewalks, making outdoor exercise challenging. She has a gym at home but feels isolated when using it. Her son and daughter-in-law exercise regularly, and she walked during her vacation but has struggled to maintain the momentum since returning home.    Past Medical History:  Diagnosis Date   Allergy    seasonal allergies   Asthma    uses unhaler   Blood transfusion without reported diagnosis 1990's   Cataract    bilateral sx   Depression    on meds   High blood pressure    on meds   HIV (human immunodeficiency virus infection) (HCC)    on meds   Hx of adenomatous polyp of colon 2015   Hyperlipidemia    on meds   Osteoarthritis    Osteopenia    Past Surgical History:  Procedure Laterality Date   Biopsy of Liver  CATARACT EXTRACTION, BILATERAL     CHOLECYSTECTOMY     COLONOSCOPY  2015   [facility]-Dr.Liakos-prep(exc)-TA x 1   CYST REMOVAL HAND Left    CYSTECTOMY     off of back   LAPAROSCOPIC SALPINGOOPHERECTOMY Right    POLYPECTOMY  2015   TA    No Known Allergies  Outpatient Encounter Medications as of 04/06/2024  Medication Sig   albuterol  (PROVENTIL ) (2.5 MG/3ML) 0.083% nebulizer solution Take 3 mLs (2.5 mg total) by nebulization every 4 (four) hours as needed for wheezing or  shortness of breath.   albuterol  (VENTOLIN  HFA) 108 (90 Base) MCG/ACT inhaler INHALE 2 PUFFS BY MOUTH INTO THE LUNGS EVERY 4 HOURS AS NEEDED   bictegravir-emtricitabine -tenofovir  AF (BIKTARVY ) 50-200-25 MG TABS tablet TAKE 1 TABLET BY MOUTH DAILY.   Biotin w/ Vitamins C & E (HAIR/SKIN/NAILS PO) Take by mouth.   Blood Pressure Monitoring (BLOOD PRESSURE CUFF) MISC 1 Device by Does not apply route daily. DX: I10   Cholecalciferol (VITAMIN D ) 125 MCG (5000 UT) CAPS Take by mouth.   ELDERBERRY PO Take by mouth.   enalapril  (VASOTEC ) 20 MG tablet Take 1 tablet (20 mg total) by mouth daily.   fenofibrate  (TRICOR ) 48 MG tablet Take 1 tablet (48 mg total) by mouth daily.   fluticasone  (FLONASE ) 50 MCG/ACT nasal spray Place 1 spray into both nostrils daily.   fluticasone -salmeterol (ADVAIR  HFA) 115-21 MCG/ACT inhaler Inhale 1 puff into the lungs 2 (two) times daily.   hydrochlorothiazide  (HYDRODIURIL ) 25 MG tablet Take 1 tablet (25 mg total) by mouth daily.   metoprolol  tartrate (LOPRESSOR ) 100 MG tablet Take 1 tablet (100 mg total) by mouth daily.   montelukast  (SINGULAIR ) 10 MG tablet Take 1 tablet (10 mg total) by mouth at bedtime.   Spacer/Aero-Holding Raguel FRENCH Use with advair  HFA   traZODone  (DESYREL ) 50 MG tablet Take 1/2-1 tablet (25-50 mg total) by mouth at bedtime as needed for sleep.   pravastatin  (PRAVACHOL ) 20 MG tablet Take 1 tablet (20 mg total) by mouth daily. (Patient not taking: Reported on 04/06/2024)   [DISCONTINUED] ascorbic acid (VITAMIN C) 500 MG tablet Take by mouth. (Patient not taking: Reported on 04/06/2024)   [DISCONTINUED] Calcium  Carbonate (CALCIUM  600 PO) Take 1 tablet by mouth daily.  (Patient not taking: Reported on 04/06/2024)   [DISCONTINUED] linaclotide  (LINZESS ) 145 MCG CAPS capsule Take 1 capsule (145 mcg total) by mouth daily before breakfast.   [DISCONTINUED] Multiple Vitamins-Minerals (MULTIVITAMIN GUMMIES ADULT PO) Take 2 tablets by mouth daily. Gummy (Patient  not taking: Reported on 04/06/2024)   [DISCONTINUED] Multiple Vitamins-Minerals (ZINC PO) Take by mouth. (Patient not taking: Reported on 04/06/2024)   [DISCONTINUED] sertraline  (ZOLOFT ) 25 MG tablet Take 1 tablet (25 mg total) by mouth daily. (Patient not taking: Reported on 04/06/2024)   [DISCONTINUED] SPIKEVAX syringe Inject 0.5 mLs into the muscle once. (Patient not taking: Reported on 04/06/2024)   No facility-administered encounter medications on file as of 04/06/2024.    Review of Systems  Constitutional:  Negative for appetite change, chills, fatigue, fever and unexpected weight change.  HENT:  Negative for congestion, ear discharge, ear pain, facial swelling, hearing loss, nosebleeds, postnasal drip, rhinorrhea, sinus pressure, sinus pain, sneezing, sore throat and tinnitus.   Eyes:  Negative for pain, discharge, redness, itching and visual disturbance.  Respiratory:  Positive for cough. Negative for chest tightness, shortness of breath and wheezing.   Cardiovascular:  Negative for chest pain, palpitations and leg swelling.  Gastrointestinal:  Negative for abdominal distention, abdominal  pain, blood in stool, constipation, diarrhea, nausea and vomiting.  Musculoskeletal:  Negative for arthralgias, back pain, gait problem and joint swelling.  Skin:  Negative for color change, pallor and rash.  Neurological:  Negative for dizziness, light-headedness, numbness and headaches.    Immunization History  Administered Date(s) Administered   Fluad  Quad(high Dose 65+) 05/16/2019, 05/31/2020, 06/06/2021, 06/16/2022   Fluad  Trivalent(High Dose 65+) 07/09/2023   Influenza,inj,Quad PF,6+ Mos 06/17/2017, 06/09/2018   Influenza-Unspecified 06/11/2016   Moderna Covid-19 Vaccine Bivalent Booster 54yrs & up 07/12/2021   Moderna Sars-Covid-2 Vaccination 07/06/2020   PFIZER(Purple Top)SARS-COV-2 Vaccination 10/20/2019, 11/14/2019   PNEUMOCOCCAL CONJUGATE-20 10/18/2023   Pfizer(Comirnaty )Fall Seasonal  Vaccine 12 years and older 07/09/2023   Pneumococcal Conjugate-13 05/20/2018   Pneumococcal Polysaccharide-23 02/15/2017   Respiratory Syncytial Virus Vaccine ,Recomb Aduvanted(Arexvy ) 09/21/2022   Tdap 09/21/2017   Zoster Recombinant(Shingrix) 07/19/2018, 02/13/2019   Pertinent  Health Maintenance Due  Topic Date Due   INFLUENZA VACCINE  04/14/2024   MAMMOGRAM  07/11/2025   Colonoscopy  12/17/2027   DEXA SCAN  Completed      04/26/2023    9:46 AM 08/26/2023    3:36 PM 10/18/2023    9:26 AM 03/01/2024    9:25 AM 04/06/2024   12:49 PM  Fall Risk  Falls in the past year? 0 0 0 0 0  Was there an injury with Fall?  0 0 0 0  Fall Risk Category Calculator  0 0 0 0  Patient at Risk for Falls Due to No Fall Risks No Fall Risks No Fall Risks  No Fall Risks  Fall risk Follow up Falls evaluation completed Falls evaluation completed Falls evaluation completed  Falls evaluation completed   Functional Status Survey:    Vitals:   04/06/24 1254  BP: 128/76  Pulse: 68  Resp: 20  Temp: 97.8 F (36.6 C)  SpO2: 98%  Weight: 174 lb 9.6 oz (79.2 kg)  Height: 5' 7 (1.702 m)   Body mass index is 27.35 kg/m. Physical Exam  VITALS: T- 97.8, P- 68, BP- 128/76, SaO2- 98% GENERAL: Alert, cooperative, well developed, no acute distress. HEENT: Normocephalic, normal oropharynx, moist mucous membranes, ears normal with no infection, nose normal, throat normal, no sinus tenderness. NECK: No lymphadenopathy. CHEST: Clear to auscultation bilaterally, no wheezes, rhonchi, or crackles. CARDIOVASCULAR: Normal heart rate and rhythm, S1 and S2 normal without murmurs. ABDOMEN: Soft, non-tender, non-distended, without organomegaly, normal bowel sounds. EXTREMITIES: No cyanosis or edema. NEUROLOGICAL: Cranial nerves grossly intact, moves all extremities without gross motor or sensory deficit.   Labs reviewed: Recent Labs    05/05/23 0842 10/18/23 0956 03/01/24 1006  NA 140 143 141  K 3.6 4.1 3.6  CL  101 103 101  CO2 31 27 33*  GLUCOSE 88 96 85  BUN 13 11 17   CREATININE 0.85 0.94 1.02*  CALCIUM  9.6 9.7 10.0   Recent Labs    04/19/23 0952 10/18/23 0956 03/01/24 1006  AST 20 22 21   ALT 15 19 17   BILITOT 0.7 0.9 0.8  PROT 7.0 6.6 6.8   Recent Labs    04/19/23 0952 10/18/23 0956 03/01/24 1006  WBC 6.2 7.8 7.8  NEUTROABS 1,941 2,691 2,348  HGB 13.7 13.2 13.9  HCT 40.5 40.2 42.4  MCV 91.0 91.2 93.0  PLT 254 247 240   Lab Results  Component Value Date   TSH 1.25 10/19/2023   Lab Results  Component Value Date   HGBA1C 5.6 04/19/2023   Lab Results  Component Value Date  CHOL 209 (H) 02/14/2024   HDL 51 02/14/2024   LDLCALC 129 (H) 02/14/2024   TRIG 173 (H) 02/14/2024   CHOLHDL 4.1 02/14/2024    Significant Diagnostic Results in last 30 days:  CT ABDOMEN W CONTRAST Result Date: 03/13/2024 CLINICAL DATA:  Abdominal mass, right side EXAM: CT ABDOMEN WITH CONTRAST TECHNIQUE: Multidetector CT imaging of the abdomen was performed using the standard protocol following bolus administration of intravenous contrast. RADIATION DOSE REDUCTION: This exam was performed according to the departmental dose-optimization program which includes automated exposure control, adjustment of the mA and/or kV according to patient size and/or use of iterative reconstruction technique. CONTRAST:  OMNIPAQUE  IOHEXOL  300 MG/ML  SOLN COMPARISON:  CT of the abdomen pelvis performed Jan 13, 2017 FINDINGS: Lower chest: No acute abnormality. Hepatobiliary: Cholecystectomy. Pancreas: Unremarkable. No pancreatic ductal dilatation or surrounding inflammatory changes. Spleen: Normal in size without focal abnormality. Adrenals/Urinary Tract: Adrenal glands are unremarkable. Kidneys are normal, without renal calculi, focal lesion, or hydronephrosis. Stomach/Bowel: Stomach is within normal limits. Appendix appears normal. No evidence of bowel wall thickening, distention, or inflammatory changes.  Vascular/Lymphatic: Nothing significant. Other: Small fat containing umbilical hernia. Musculoskeletal: No acute or significant osseous findings. IMPRESSION: 1. No discrete superficial abdominal wall mass is appreciated by CT. Consider further evaluation by patient directed ultrasound if clinically appropriate. 2. Fat containing umbilical hernia, small. 3. Cholecystectomy. Electronically Signed   By: Maude Naegeli M.D.   On: 03/13/2024 09:42    Assessment/Plan  Upper Respiratory Infection She developed a cough and congestion after returning from vacation, similar to her sister who was diagnosed with a viral infection and tested negative for COVID-19. She has been using Mucinex with symptom relief and reports no fever, nausea, or vomiting. Physical examination reveals clear lungs and no wheezing, suggesting a viral upper respiratory infection. - Continue Mucinex as needed for cough. - Increase fluid intake. - Rest and avoid sugary foods. - Return if symptoms worsen.  Asthma She uses rescue and regular inhalers due to recent respiratory symptoms. No wheezing or asthma attack has occurred, but the cough has triggered her asthma. Physical examination shows clear lungs, indicating well-controlled asthma. - Continue rescue and regular inhalers as needed.  Hyperlipidemia She is on pravastatin  for cholesterol management but had stopped it, thinking it was replaced by fenofibrate . It was clarified that fenofibrate  is for triglycerides, while pravastatin  is for total cholesterol and LDL. She agreed to restart pravastatin . - Restart pravastatin . - Recheck cholesterol levels at the next visit.  General Health Maintenance She takes a multivitamin and vitamin D . Advised to continue healthy eating and exercise. Reports difficulty maintaining an exercise routine but is encouraged to gradually increase activity. - Continue multivitamin and vitamin D . - Encourage regular exercise and healthy  eating.  Follow-up She requires a physical exam for insurance by September 26th and has a Medicare annual wellness visit scheduled for December 15th. A regular six-month follow-up is scheduled for August 11th. - Perform physical exam during the August 11th follow-up. - Confirm Medicare annual wellness visit for December 15th.    Family/ staff Communication: Reviewed plan of care with patient verbalized understanding   Labs/tests ordered: None   Next Appointment: Return if symptoms worsen or fail to improve.   Total time: 30 minutes. Greater than 50% of total time spent doing patient education regarding Asthma, T2DM,HTN, HLD,cough,health maintenance including symptom/medication management.   Roxan JAYSON Plough, NP

## 2024-04-06 NOTE — Progress Notes (Signed)
 Specialty Pharmacy Refill Coordination Note  Maria Adkins is a 73 y.o. female contacted today regarding refills of specialty medication(s) Bictegravir-Emtricitab-Tenofov (Biktarvy )   Patient requested (Patient-Rptd) Delivery   Delivery date: 04/07/24   Verified address: (Patient-Rptd) [address], [place], [place] [address]   Medication will be filled on 07.24.25.

## 2024-04-12 ENCOUNTER — Other Ambulatory Visit (HOSPITAL_BASED_OUTPATIENT_CLINIC_OR_DEPARTMENT_OTHER): Payer: Self-pay

## 2024-04-24 ENCOUNTER — Ambulatory Visit: Payer: PPO | Admitting: Family

## 2024-04-25 ENCOUNTER — Encounter (INDEPENDENT_AMBULATORY_CARE_PROVIDER_SITE_OTHER): Payer: Self-pay

## 2024-04-26 ENCOUNTER — Other Ambulatory Visit: Payer: Self-pay

## 2024-04-26 ENCOUNTER — Other Ambulatory Visit (HOSPITAL_COMMUNITY): Payer: Self-pay

## 2024-04-26 NOTE — Progress Notes (Signed)
 Specialty Pharmacy Refill Coordination Note  MyChart Questionnaire Submission  Maria Adkins is a 73 y.o. female contacted today regarding refills of specialty medication(s) Biktarvy .  Doses on hand: (Patient-Rptd) 21   Patient requested: (Patient-Rptd) Delivery   Delivery date: 05/01/24  Verified address: [address] [place] [address]   Medication will be filled on 04/28/24.

## 2024-04-28 ENCOUNTER — Ambulatory Visit: Admitting: Family

## 2024-04-28 ENCOUNTER — Other Ambulatory Visit: Payer: Self-pay

## 2024-05-01 ENCOUNTER — Ambulatory Visit (INDEPENDENT_AMBULATORY_CARE_PROVIDER_SITE_OTHER): Admitting: Family

## 2024-05-01 ENCOUNTER — Encounter: Payer: Self-pay | Admitting: Family

## 2024-05-01 VITALS — BP 126/72 | HR 66 | Temp 97.8°F | Resp 20 | Ht 67.0 in | Wt 177.4 lb

## 2024-05-01 DIAGNOSIS — B351 Tinea unguium: Secondary | ICD-10-CM | POA: Diagnosis not present

## 2024-05-01 DIAGNOSIS — M19011 Primary osteoarthritis, right shoulder: Secondary | ICD-10-CM | POA: Diagnosis not present

## 2024-05-01 DIAGNOSIS — K429 Umbilical hernia without obstruction or gangrene: Secondary | ICD-10-CM | POA: Diagnosis not present

## 2024-05-01 DIAGNOSIS — Z0000 Encounter for general adult medical examination without abnormal findings: Secondary | ICD-10-CM

## 2024-05-01 DIAGNOSIS — M19012 Primary osteoarthritis, left shoulder: Secondary | ICD-10-CM

## 2024-05-01 NOTE — Progress Notes (Signed)
 Provider: Roxan Plough FNP-C   Averil Digman, Roxan BROCKS, NP  Patient Care Team: Lihanna Biever, Roxan BROCKS, NP as PCP - General (Family Medicine)  Extended Emergency Contact Information Primary Emergency Contact: [facility] Address: [address]          [place], [place] [address] [place] Home Phone: [phone] Relation: Spouse Secondary Emergency Contact: Kloos,Rushdan Address: [address]          [place], [place] [address] [place] Home Phone: [phone] Mobile Phone: [phone] Relation: Son  Code Status:  Full Code  Goals of care: Advanced Directive information    04/06/2024   12:49 PM  Advanced Directives  Does Patient Have a Medical Advance Directive? No  Would patient like information on creating a medical advance directive? No - Patient declined     Chief Complaint  Patient presents with   Medical Management of Chronic Issues    6 Month follow up    Discussed the use of AI scribe software for clinical note transcription with the patient, who gave verbal consent to proceed.  History of Present Illness   Maria Adkins is a 73 year old female who presents for a six-month follow-up and insurance physical exam.  She has no current issues and her previous cough, for which she was seen in July, has resolved. She reports a recent indulgence in chocolate, which she attributes to a temporary increase in her weight. Her immunizations are up to date except for the flu shot.  She denies any issues with anxiety or depression. Bowel movements are irregular, occurring every other day. She has been taking a tablespoon of vinegar and water and increasing her water intake to aid bowel movements. She has also started incorporating more vegetables like cauliflower, squash, and carrots into her diet. She reports frequent urination, which she attributes to increased water intake, and denies any burning or itching during urination.  She mentions a history of  toenail fungus, which had previously resolved but has recurred after a recent pedicure. She has been using peroxide to manage it.  She recalls a CT scan done earlier this year, which revealed a small fat-containing umbilical hernia and a history of gallbladder removal.   Past Medical History:  Diagnosis Date   Allergy    seasonal allergies   Asthma    uses unhaler   Blood transfusion without reported diagnosis 1990's   Cataract    bilateral sx   Depression    on meds   High blood pressure    on meds   HIV (human immunodeficiency virus infection) (HCC)    on meds   Hx of adenomatous polyp of colon 2015   Hyperlipidemia    on meds   Osteoarthritis    Osteopenia    Past Surgical History:  Procedure Laterality Date   Biopsy of Liver     CATARACT EXTRACTION, BILATERAL     CHOLECYSTECTOMY     COLONOSCOPY  2015   MAC-Dr.Liakos-prep(exc)-TA x 1   CYST REMOVAL HAND Left    CYSTECTOMY     off of back   LAPAROSCOPIC SALPINGOOPHERECTOMY Right    POLYPECTOMY  2015   TA    No Known Allergies  Allergies as of 05/01/2024   No Known Allergies      Medication List        Accurate as of May 01, 2024 10:13 PM. If you have any questions, ask your nurse or doctor.  Advair  HFA 115-21 MCG/ACT inhaler Generic drug: fluticasone -salmeterol Inhale 1 puff into the lungs 2 (two) times daily.   albuterol  (2.5 MG/3ML) 0.083% nebulizer solution Commonly known as: PROVENTIL  Take 3 mLs (2.5 mg total) by nebulization every 4 (four) hours as needed for wheezing or shortness of breath.   albuterol  108 (90 Base) MCG/ACT inhaler Commonly known as: VENTOLIN  HFA INHALE 2 PUFFS BY MOUTH INTO THE LUNGS EVERY 4 HOURS AS NEEDED   Biktarvy  50-200-25 MG Tabs tablet Generic drug: bictegravir-emtricitabine -tenofovir  AF TAKE 1 TABLET BY MOUTH DAILY.   Blood Pressure Cuff Misc 1 Device by Does not apply route daily. DX: [employer] Use with advair  HFA    ELDERBERRY PO Take by mouth.   enalapril  20 MG tablet Commonly known as: Vasotec  Take 1 tablet (20 mg total) by mouth daily.   fenofibrate  48 MG tablet Commonly known as: Tricor  Take 1 tablet (48 mg total) by mouth daily.   fluticasone  50 MCG/ACT nasal spray Commonly known as: FLONASE  Place 1 spray into both nostrils daily.   HAIR/SKIN/NAILS PO Take by mouth.   hydrochlorothiazide  25 MG tablet Commonly known as: HYDRODIURIL  Take 1 tablet (25 mg total) by mouth daily.   metoprolol  tartrate 100 MG tablet Commonly known as: LOPRESSOR  Take 1 tablet (100 mg total) by mouth daily.   montelukast  10 MG tablet Commonly known as: SINGULAIR  Take 1 tablet (10 mg total) by mouth at bedtime.   pravastatin  20 MG tablet Commonly known as: PRAVACHOL  Take 1 tablet (20 mg total) by mouth daily.   traZODone  50 MG tablet Commonly known as: DESYREL  Take 1/2-1 tablet (25-50 mg total) by mouth at bedtime as needed for sleep.   Vitamin D  125 MCG (5000 UT) Caps Take by mouth.        Review of Systems  Constitutional:  Negative for appetite change, chills, fatigue, fever and unexpected weight change.  HENT:  Negative for congestion, dental problem, ear discharge, ear pain, facial swelling, hearing loss, nosebleeds, postnasal drip, rhinorrhea, sinus pressure, sinus pain, sneezing, sore throat, tinnitus and trouble swallowing.   Eyes:  Negative for pain, discharge, redness, itching and visual disturbance.  Respiratory:  Negative for cough, chest tightness, shortness of breath and wheezing.   Cardiovascular:  Negative for chest pain, palpitations and leg swelling.  Gastrointestinal:  Negative for abdominal distention, abdominal pain, blood in stool, constipation, diarrhea, nausea and vomiting.  Endocrine: Negative for cold intolerance, heat intolerance, polydipsia, polyphagia and polyuria.  Genitourinary:  Negative for difficulty urinating, dysuria, flank pain, frequency and urgency.   Musculoskeletal:  Negative for arthralgias, back pain, gait problem, joint swelling, myalgias, neck pain and neck stiffness.  Skin:  Negative for color change, pallor, rash and wound.       Left great toe fungal infection   Neurological:  Negative for dizziness, syncope, speech difficulty, weakness, light-headedness, numbness and headaches.  Hematological:  Does not bruise/bleed easily.  Psychiatric/Behavioral:  Negative for agitation, behavioral problems, confusion, hallucinations, self-injury, sleep disturbance and suicidal ideas. The patient is not nervous/anxious.     Immunization History  Administered Date(s) Administered   Fluad  Quad(high Dose 65+) 05/16/2019, 05/31/2020, 06/06/2021, 06/16/2022   Fluad  Trivalent(High Dose 65+) 07/09/2023   Influenza,inj,Quad PF,6+ Mos 06/17/2017, 06/09/2018   Influenza-Unspecified 06/11/2016   Moderna Covid-19 Vaccine Bivalent Booster 9yrs & up 07/12/2021   Moderna Sars-Covid-2 Vaccination 07/06/2020   PFIZER(Purple Top)SARS-COV-2 Vaccination 10/20/2019, 11/14/2019   PNEUMOCOCCAL CONJUGATE-20 10/18/2023   Pfizer(Comirnaty )Fall Seasonal Vaccine 12 years and older 07/09/2023  Pneumococcal Conjugate-13 05/20/2018   Pneumococcal Polysaccharide-23 02/15/2017   Respiratory Syncytial Virus Vaccine ,Recomb Aduvanted(Arexvy ) 09/21/2022   Tdap 09/21/2017   Zoster Recombinant(Shingrix) 07/19/2018, 02/13/2019   Pertinent  Health Maintenance Due  Topic Date Due   INFLUENZA VACCINE  06/01/2024 (Originally 04/14/2024)   Colonoscopy  12/17/2027   DEXA SCAN  Completed      04/26/2023    9:46 AM 08/26/2023    3:36 PM 10/18/2023    9:26 AM 03/01/2024    9:25 AM 04/06/2024   12:49 PM  Fall Risk  Falls in the past year? 0 0 0 0 0  Was there an injury with Fall?  0 0 0 0  Fall Risk Category Calculator  0 0 0 0  Patient at Risk for Falls Due to No Fall Risks No Fall Risks No Fall Risks  No Fall Risks  Fall risk Follow up Falls evaluation completed Falls  evaluation completed Falls evaluation completed  Falls evaluation completed   Functional Status Survey:    Vitals:   05/01/24 1401  BP: 126/72  Pulse: 66  Resp: 20  Temp: 97.8 F (36.6 C)  SpO2: 97%  Weight: 177 lb 6.4 oz (80.5 kg)  Height: 5' 7 (1.702 m)   Body mass index is 27.78 kg/m. Physical Exam VITALS: T- 97.8, P- 66, BP- 126/72, SaO2- 97% MEASUREMENTS: Weight- 177.4. GENERAL: Alert, cooperative, well developed, no acute distress HEENT: Normocephalic, normal oropharynx, moist mucous membranes, ears normal, nose normal, no sinus tenderness NECK: Thyroid  normal CHEST: Clear to auscultation bilaterally, no wheezes, rhonchi, or crackles, no chest wall tenderness CARDIOVASCULAR: Normal heart rate and rhythm, S1 and S2 normal without murmurs ABDOMEN: Soft, non-tender, non-distended, without organomegaly, normal bowel sounds EXTREMITIES: No cyanosis or edema, no calf tenderness MUSCULOSKELETAL: Knees non-tender, full range of motion NEUROLOGICAL: Cranial nerves grossly intact, moves all extremities without gross motor or sensory deficit, extraocular movements intact, motor strength 5/5 in upper extremities  SKIN: No rash,no lesion or erythema   PSYCHIATRY/BEHAVIORAL: Mood stable    Labs reviewed: Recent Labs    05/05/23 0842 10/18/23 0956 03/01/24 1006  NA 140 143 141  K 3.6 4.1 3.6  CL 101 103 101  CO2 31 27 33*  GLUCOSE 88 96 85  BUN 13 11 17   CREATININE 0.85 0.94 1.02*  CALCIUM  9.6 9.7 10.0   Recent Labs    10/18/23 0956 03/01/24 1006  AST 22 21  ALT 19 17  BILITOT 0.9 0.8  PROT 6.6 6.8   Recent Labs    10/18/23 0956 03/01/24 1006  WBC 7.8 7.8  NEUTROABS 2,691 2,348  HGB 13.2 13.9  HCT 40.2 42.4  MCV 91.2 93.0  PLT 247 240   Lab Results  Component Value Date   TSH 1.25 10/19/2023   Lab Results  Component Value Date   HGBA1C 5.6 04/19/2023   Lab Results  Component Value Date   CHOL 209 (H) 02/14/2024   HDL 51 02/14/2024   LDLCALC 129  (H) 02/14/2024   TRIG 173 (H) 02/14/2024   CHOLHDL 4.1 02/14/2024    Significant Diagnostic Results in last 30 days:  No results found.  Assessment/Plan  Annual Physical examination  Routine six-month follow-up with well-controlled blood pressure at 126/72 mmHg, heart rate 66 bpm, and slight weight increase to 177.4 lbs. No new complaints. Immunizations up to date except for flu shot due next month. Regular exercise and dietary habits discussed. 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 Pain exacerbated by certain positions, especially after sleeping on the affected side. Exercises performed to maintain mobility. - Continue shoulder exercises to maintain mobility and reduce pain  Onychomycosis of left toe Recurrent fungal infection of the left toenail with previous toenail loss and regrowth. Current management includes peroxide application and keeping the nail trimmed. Previous treatments ineffective for her husband. - Consider referral to podiatrist for debridement and evaluation - Discuss potential topical antifungal treatment for seven days  Umbilical hernia without obstruction or gangrene Small fat-containing umbilical hernia identified on CT scan, asymptomatic.  General Health Maintenance Routine health maintenance up to date. Bone density, pneumonia vaccine, and shingles vaccine current. Vision and dental appointments pending. - Schedule vision and dental appointments - Consider COVID-19 booster when available   Family/ staff Communication: Reviewed plan of care with patient verbalized understanding   Labs/tests ordered: None   Next Appointment : Return in about 1 year (around  05/01/2025) for annual Physical examination.   Spent 30 minutes of Face to face and non-face to face with patient  >50% time spent counseling; reviewing medical record; tests; labs; documentation and developing future plan of care.   Roxan JAYSON Plough, NP

## 2024-05-19 ENCOUNTER — Other Ambulatory Visit: Payer: Self-pay

## 2024-05-25 ENCOUNTER — Other Ambulatory Visit: Payer: Self-pay

## 2024-05-31 ENCOUNTER — Other Ambulatory Visit: Payer: Self-pay

## 2024-05-31 ENCOUNTER — Ambulatory Visit: Admitting: Internal Medicine

## 2024-05-31 ENCOUNTER — Encounter: Payer: Self-pay | Admitting: Internal Medicine

## 2024-05-31 VITALS — BP 131/84 | HR 79 | Temp 97.8°F | Ht 67.75 in | Wt 175.0 lb

## 2024-05-31 DIAGNOSIS — Z79899 Other long term (current) drug therapy: Secondary | ICD-10-CM

## 2024-05-31 DIAGNOSIS — Z23 Encounter for immunization: Secondary | ICD-10-CM

## 2024-05-31 DIAGNOSIS — G4700 Insomnia, unspecified: Secondary | ICD-10-CM | POA: Diagnosis not present

## 2024-05-31 DIAGNOSIS — B20 Human immunodeficiency virus [HIV] disease: Secondary | ICD-10-CM

## 2024-05-31 MED ORDER — COVID-19 MRNA VAC-TRIS(PFIZER) 30 MCG/0.3ML IM SUSY
0.3000 mL | PREFILLED_SYRINGE | Freq: Once | INTRAMUSCULAR | 0 refills | Status: AC
  Filled 2024-05-31: qty 0.3, 1d supply, fill #0

## 2024-05-31 NOTE — Progress Notes (Signed)
 RFV: follow up for hiv disease   [name], female   DOB: 23-Dec-1950, 73 y.o.   MRN: [id number]  HPI Maria Adkins is a 74yo F with well controlled hiv disease, CD 4 count of 545/VL< 20 on biktarvy . She is otherwise doing well. She has not missed a dose. Tries to keep active for exercise  Outpatient Encounter Medications as of 05/31/2024  Medication Sig   albuterol  (PROVENTIL ) (2.5 MG/3ML) 0.083% nebulizer solution Take 3 mLs (2.5 mg total) by nebulization every 4 (four) hours as needed for wheezing or shortness of breath.   albuterol  (VENTOLIN  HFA) 108 (90 Base) MCG/ACT inhaler INHALE 2 PUFFS BY MOUTH INTO THE LUNGS EVERY 4 HOURS AS NEEDED   bictegravir-emtricitabine -tenofovir  AF (BIKTARVY ) 50-200-25 MG TABS tablet TAKE 1 TABLET BY MOUTH DAILY.   Biotin w/ Vitamins C & E (HAIR/SKIN/NAILS PO) Take by mouth.   Blood Pressure Monitoring (BLOOD PRESSURE CUFF) MISC 1 Device by Does not apply route daily. DX: I10   Cholecalciferol (VITAMIN D ) 125 MCG (5000 UT) CAPS Take by mouth.   ELDERBERRY PO Take by mouth.   enalapril  (VASOTEC ) 20 MG tablet Take 1 tablet (20 mg total) by mouth daily.   fenofibrate  (TRICOR ) 48 MG tablet Take 1 tablet (48 mg total) by mouth daily.   fluticasone  (FLONASE ) 50 MCG/ACT nasal spray Place 1 spray into both nostrils daily.   fluticasone -salmeterol (ADVAIR  HFA) 115-21 MCG/ACT inhaler Inhale 1 puff into the lungs 2 (two) times daily.   hydrochlorothiazide  (HYDRODIURIL ) 25 MG tablet Take 1 tablet (25 mg total) by mouth daily.   metoprolol  tartrate (LOPRESSOR ) 100 MG tablet Take 1 tablet (100 mg total) by mouth daily.   montelukast  (SINGULAIR ) 10 MG tablet Take 1 tablet (10 mg total) by mouth at bedtime.   pravastatin  (PRAVACHOL ) 20 MG tablet Take 1 tablet (20 mg total) by mouth daily.   Spacer/Aero-Holding [employer] Use with advair  HFA   traZODone  (DESYREL ) 50 MG tablet Take 1/2-1 tablet (25-50 mg total) by mouth at bedtime as needed for sleep.    [DISCONTINUED] linaclotide  (LINZESS ) 145 MCG CAPS capsule Take 1 capsule (145 mcg total) by mouth daily before breakfast.   No facility-administered encounter medications on file as of 05/31/2024.     Patient Active Problem List   Diagnosis Date Noted   Umbilical hernia without obstruction or gangrene 05/01/2024   Primary osteoarthritis of both shoulders 05/01/2024   Severe persistent asthma, unspecified whether complicated 10/19/2023   Depression, major, single episode, complete remission (HCC) 03/28/2021   Mixed hyperlipidemia 03/28/2021   Primary insomnia 03/28/2021   Depression, major, single episode, moderate (HCC) 03/28/2021   Chronic idiopathic constipation 03/28/2021   Seasonal allergies 01/13/2018   Asthma 02/05/2017   Allergy 12/16/2016   Age related osteoporosis 08/19/2016   Essential hypertension 08/19/2016   Human immunodeficiency virus (HIV) disease (HCC) 08/19/2016   Hx of adenomatous polyp of colon 2015     Health Maintenance Due  Topic Date Due   COVID-19 Vaccine (6 - Mixed Product risk 2024-25 season) 05/15/2024     Review of Systems Review of Systems  Constitutional: Negative for fever, chills, diaphoresis, activity change, appetite change, fatigue and unexpected weight change.  HENT: Negative for congestion, sore throat, rhinorrhea, sneezing, trouble swallowing and sinus pressure.  Eyes: Negative for photophobia and visual disturbance.  Respiratory: Negative for cough, chest tightness, shortness of breath, wheezing and stridor.  Cardiovascular: Negative for chest pain, palpitations and leg swelling.  Gastrointestinal: Negative for nausea, vomiting, abdominal  pain, diarrhea, constipation, blood in stool, abdominal distention and anal bleeding.  Genitourinary: Negative for dysuria, hematuria, flank pain and difficulty urinating.  Musculoskeletal: Negative for myalgias, back pain, joint swelling, arthralgias and gait problem.  Skin: Negative for color change,  pallor, rash and wound.  Neurological: Negative for dizziness, tremors, weakness and light-headedness.  Hematological: Negative for adenopathy. Does not bruise/bleed easily.  Psychiatric/Behavioral: Negative for behavioral problems, confusion, sleep disturbance, dysphoric mood, decreased concentration and agitation.   Physical Exam   BP 131/84   Pulse 79   Temp 97.8 F (36.6 C) (Temporal)   Ht 5' 7.75 (1.721 m)   Wt 175 lb (79.4 kg)   SpO2 96%   BMI 26.81 kg/m   Physical Exam  Constitutional:  oriented to person, place, and time. appears well-developed and well-nourished. No distress.  HENT: Cook/AT, PERRLA, no scleral icterus Mouth/Throat: Oropharynx is clear and moist. No oropharyngeal exudate.  Cardiovascular: Normal rate, regular rhythm and normal heart sounds. Exam reveals no gallop and no friction rub.  No murmur heard.  Pulmonary/Chest: Effort normal and breath sounds normal. No respiratory distress.  has no wheezes.  Neck = supple, no nuchal rigidity Lymphadenopathy: no cervical adenopathy. No axillary adenopathy Neurological: alert and oriented to person, place, and time.  Skin: Skin is warm and dry. No rash noted. No erythema.  Psychiatric: a normal mood and affect.  behavior is normal.   Lab Results  Component Value Date   CD4TCELL 14 (L) 03/01/2024   Lab Results  Component Value Date   CD4TABS 545 10/18/2023   CD4TABS 641 04/26/2023   CD4TABS 620 08/25/2021   Lab Results  Component Value Date   HIV1RNAQUANT <20 DETECTED (A) 03/01/2024   Lab Results  Component Value Date   HEPBSAB POS (A) 09/24/2016   Lab Results  Component Value Date   LABRPR NON-REACTIVE 10/18/2023    CBC Lab Results  Component Value Date   WBC 7.8 03/01/2024   RBC 4.56 03/01/2024   HGB 13.9 03/01/2024   HCT 42.4 03/01/2024   PLT 240 03/01/2024   MCV 93.0 03/01/2024   MCH 30.5 03/01/2024   MCHC 32.8 03/01/2024   RDW 13.1 03/01/2024   LYMPHSABS 3,410 04/19/2023   MONOABS 0.2  02/03/2020   EOSABS 312 03/01/2024    BMET Lab Results  Component Value Date   NA 141 03/01/2024   K 3.6 03/01/2024   CL 101 03/01/2024   CO2 33 (H) 03/01/2024   GLUCOSE 85 03/01/2024   BUN 17 03/01/2024   CREATININE 1.02 (H) 03/01/2024   CALCIUM  10.0 03/01/2024   GFRNONAA 64 01/28/2021   GFRAA 75 01/28/2021      Assessment and Plan HIV disease = will check CD 4 count and HIV VL. Recommend to continue with biktarvy .  Long term medication management = will check cr function  Health maintenance = will give Flu vacccine and then she will be Getting covid vac - pharmacy  Insomnia = can do sleep hygiene, meditation app to see if that could help her symptoms.

## 2024-05-31 NOTE — Patient Instructions (Signed)
 Please go to the pharmacy next door for your covid vaccine

## 2024-06-02 ENCOUNTER — Other Ambulatory Visit: Payer: Self-pay

## 2024-06-02 LAB — COMPLETE METABOLIC PANEL WITHOUT GFR
AG Ratio: 1.8 (calc) (ref 1.0–2.5)
ALT: 16 U/L (ref 6–29)
AST: 19 U/L (ref 10–35)
Albumin: 4.2 g/dL (ref 3.6–5.1)
Alkaline phosphatase (APISO): 56 U/L (ref 37–153)
BUN: 17 mg/dL (ref 7–25)
CO2: 31 mmol/L (ref 20–32)
Calcium: 9.3 mg/dL (ref 8.6–10.4)
Chloride: 104 mmol/L (ref 98–110)
Creat: 0.85 mg/dL (ref 0.60–1.00)
Globulin: 2.3 g/dL (ref 1.9–3.7)
Glucose, Bld: 113 mg/dL — ABNORMAL HIGH (ref 65–99)
Potassium: 3.8 mmol/L (ref 3.5–5.3)
Sodium: 141 mmol/L (ref 135–146)
Total Bilirubin: 0.7 mg/dL (ref 0.2–1.2)
Total Protein: 6.5 g/dL (ref 6.1–8.1)

## 2024-06-02 LAB — T-HELPER CELLS (CD4) COUNT (NOT AT ARMC)
Absolute CD4: 650 {cells}/uL (ref 490–1740)
CD4 T Helper %: 14 % — ABNORMAL LOW (ref 30–61)
Total lymphocyte count: 4500 {cells}/uL — ABNORMAL HIGH (ref 850–3900)

## 2024-06-02 LAB — CBC WITH DIFFERENTIAL/PLATELET
Absolute Lymphocytes: 4461 {cells}/uL — ABNORMAL HIGH (ref 850–3900)
Absolute Monocytes: 540 {cells}/uL (ref 200–950)
Basophils Absolute: 84 {cells}/uL (ref 0–200)
Basophils Relative: 1.1 %
Eosinophils Absolute: 289 {cells}/uL (ref 15–500)
Eosinophils Relative: 3.8 %
HCT: 41.9 % (ref 35.0–45.0)
Hemoglobin: 14 g/dL (ref 11.7–15.5)
MCH: 31.1 pg (ref 27.0–33.0)
MCHC: 33.4 g/dL (ref 32.0–36.0)
MCV: 93.1 fL (ref 80.0–100.0)
MPV: 10.3 fL (ref 7.5–12.5)
Monocytes Relative: 7.1 %
Neutro Abs: 2227 {cells}/uL (ref 1500–7800)
Neutrophils Relative %: 29.3 %
Platelets: 232 Thousand/uL (ref 140–400)
RBC: 4.5 Million/uL (ref 3.80–5.10)
RDW: 13.3 % (ref 11.0–15.0)
Total Lymphocyte: 58.7 %
WBC: 7.6 Thousand/uL (ref 3.8–10.8)

## 2024-06-02 LAB — HIV-1 RNA QUANT-NO REFLEX-BLD
HIV 1 RNA Quant: NOT DETECTED {copies}/mL
HIV-1 RNA Quant, Log: NOT DETECTED {Log_copies}/mL

## 2024-06-04 ENCOUNTER — Other Ambulatory Visit (HOSPITAL_BASED_OUTPATIENT_CLINIC_OR_DEPARTMENT_OTHER): Payer: Self-pay

## 2024-06-05 ENCOUNTER — Other Ambulatory Visit: Payer: Self-pay

## 2024-06-06 ENCOUNTER — Other Ambulatory Visit: Payer: Self-pay

## 2024-06-06 ENCOUNTER — Encounter (INDEPENDENT_AMBULATORY_CARE_PROVIDER_SITE_OTHER): Payer: Self-pay

## 2024-06-06 NOTE — Progress Notes (Signed)
 Specialty Pharmacy Refill Coordination Note  Maria Adkins is a 73 y.o. female contacted today regarding refills of specialty medication(s) Bictegravir-Emtricitab-Tenofov (Biktarvy )   Patient requested (Patient-Rptd) Delivery   Delivery date: 06/12/24   Verified address: (Patient-Rptd) [address], [place], [place] [address]   Medication will be filled on 06/09/24.

## 2024-06-08 ENCOUNTER — Other Ambulatory Visit: Payer: Self-pay

## 2024-06-20 ENCOUNTER — Other Ambulatory Visit: Payer: Self-pay | Admitting: Family

## 2024-06-20 NOTE — Telephone Encounter (Signed)
 It states that patient end date for medication was 05/31/2024.

## 2024-06-22 ENCOUNTER — Other Ambulatory Visit (HOSPITAL_BASED_OUTPATIENT_CLINIC_OR_DEPARTMENT_OTHER): Payer: Self-pay

## 2024-06-22 MED ORDER — PRAVASTATIN SODIUM 20 MG PO TABS
20.0000 mg | ORAL_TABLET | Freq: Every day | ORAL | 1 refills | Status: AC
  Filled 2024-06-22: qty 90, 90d supply, fill #0
  Filled 2024-09-24: qty 90, 90d supply, fill #1

## 2024-06-22 NOTE — Telephone Encounter (Signed)
 Rx not sent from me

## 2024-06-30 ENCOUNTER — Other Ambulatory Visit: Payer: Self-pay

## 2024-07-03 ENCOUNTER — Encounter (INDEPENDENT_AMBULATORY_CARE_PROVIDER_SITE_OTHER): Payer: Self-pay

## 2024-07-04 ENCOUNTER — Other Ambulatory Visit: Payer: Self-pay

## 2024-07-04 NOTE — Progress Notes (Signed)
 Specialty Pharmacy Refill Coordination Note  Maria Adkins is a 73 y.o. female contacted today regarding refills of specialty medication(s) Bictegravir-Emtricitab-Tenofov (Biktarvy )   Patient requested Delivery   Delivery date: 07/06/24   Verified address: [address] [place] [address]   Medication will be filled on 10.22.25.

## 2024-07-12 ENCOUNTER — Other Ambulatory Visit: Payer: Self-pay | Admitting: Family

## 2024-07-12 ENCOUNTER — Other Ambulatory Visit (HOSPITAL_BASED_OUTPATIENT_CLINIC_OR_DEPARTMENT_OTHER): Payer: Self-pay

## 2024-07-12 MED ORDER — METOPROLOL TARTRATE 100 MG PO TABS
100.0000 mg | ORAL_TABLET | Freq: Every day | ORAL | 1 refills | Status: AC
  Filled 2024-07-12: qty 90, 90d supply, fill #0
  Filled 2024-10-10: qty 90, 90d supply, fill #1

## 2024-07-27 ENCOUNTER — Other Ambulatory Visit: Payer: Self-pay

## 2024-07-31 ENCOUNTER — Other Ambulatory Visit: Payer: Self-pay

## 2024-08-02 ENCOUNTER — Other Ambulatory Visit: Payer: Self-pay

## 2024-08-02 NOTE — Progress Notes (Signed)
 Specialty Pharmacy Ongoing Clinical Assessment Note  Maria Adkins is a 73 y.o. female who is being followed by the specialty pharmacy service for RxSp HIV   Patient's specialty medication(s) reviewed today: Bictegravir-Emtricitab-Tenofov (Biktarvy )   Missed doses in the last 4 weeks: 0   Patient/Caregiver did not have any additional questions or concerns.   Therapeutic benefit summary: Patient is achieving benefit   Adverse events/side effects summary: No adverse events/side effects   Patient's therapy is appropriate to: Continue    Goals Addressed             This Visit's Progress    Achieve Undetectable HIV Viral Load < 20   On track    Patient is on track. Patient will maintain adherence. Viral load remains undetectable long term.          Follow up: 12 months  Landry Lewis Specialty Pharmacist Clinical Intervention Note  Clinical Intervention Notes: Patient reported starting Colace. No DDIs identified with Biktarvy    Clinical Intervention Outcomes: Prevention of an adverse drug event   Advertising Account Planner

## 2024-08-02 NOTE — Progress Notes (Signed)
 Specialty Pharmacy Refill Coordination Note  Maria Adkins is a 73 y.o. female contacted today regarding refills of specialty medication(s) Bictegravir-Emtricitab-Tenofov (Biktarvy )   Patient requested Delivery   Delivery date: 08/09/24   Verified address: [address] [place] [address]   Medication will be filled on: 08/08/24

## 2024-08-08 ENCOUNTER — Other Ambulatory Visit: Payer: Self-pay

## 2024-08-14 ENCOUNTER — Other Ambulatory Visit (HOSPITAL_BASED_OUTPATIENT_CLINIC_OR_DEPARTMENT_OTHER): Payer: Self-pay | Admitting: Family

## 2024-08-14 DIAGNOSIS — Z1231 Encounter for screening mammogram for malignant neoplasm of breast: Secondary | ICD-10-CM

## 2024-08-21 ENCOUNTER — Encounter: Payer: Self-pay | Admitting: Internal Medicine

## 2024-08-21 ENCOUNTER — Ambulatory Visit: Admitting: Internal Medicine

## 2024-08-21 ENCOUNTER — Other Ambulatory Visit (HOSPITAL_BASED_OUTPATIENT_CLINIC_OR_DEPARTMENT_OTHER): Payer: Self-pay

## 2024-08-21 ENCOUNTER — Other Ambulatory Visit: Payer: Self-pay

## 2024-08-21 VITALS — BP 137/87 | HR 73 | Temp 98.5°F | Wt 180.0 lb

## 2024-08-21 DIAGNOSIS — I10 Essential (primary) hypertension: Secondary | ICD-10-CM | POA: Diagnosis not present

## 2024-08-21 DIAGNOSIS — Z79899 Other long term (current) drug therapy: Secondary | ICD-10-CM | POA: Diagnosis not present

## 2024-08-21 DIAGNOSIS — B20 Human immunodeficiency virus [HIV] disease: Secondary | ICD-10-CM | POA: Diagnosis not present

## 2024-08-21 DIAGNOSIS — F321 Major depressive disorder, single episode, moderate: Secondary | ICD-10-CM | POA: Diagnosis not present

## 2024-08-21 MED ORDER — FLUOXETINE HCL 10 MG PO CAPS
10.0000 mg | ORAL_CAPSULE | Freq: Every day | ORAL | 3 refills | Status: DC
  Filled 2024-08-21: qty 30, 30d supply, fill #0
  Filled 2024-09-15: qty 90, 90d supply, fill #0
  Filled 2024-09-15: qty 30, 30d supply, fill #1

## 2024-08-21 MED ORDER — BIKTARVY 50-200-25 MG PO TABS
1.0000 | ORAL_TABLET | Freq: Every day | ORAL | 11 refills | Status: DC
  Filled 2024-08-21 – 2024-09-11 (×3): qty 30, 30d supply, fill #0

## 2024-08-21 NOTE — Progress Notes (Unsigned)
 RFV: follow up for HIV disease   [name], female   DOB: [birth year], 73 y.o.   MRN: [id number]  HPI 73yo F with HIV disease,Cd 4 count of 650/VL<20 in mid Sep 2025. excellent adherence on biktarvy , but sleeping better. Feels blessed, but noticed feeling down of late. I feel down but don't have a reason to feel down- for the past couple months. Has been going to church, and church group - crying, over whelming for her.  Going to [place] for July to celebrate her bday  Outpatient Encounter Medications as of 08/21/2024  Medication Sig   albuterol  (PROVENTIL ) (2.5 MG/3ML) 0.083% nebulizer solution Take 3 mLs (2.5 mg total) by nebulization every 4 (four) hours as needed for wheezing or shortness of breath.   albuterol  (VENTOLIN  HFA) 108 (90 Base) MCG/ACT inhaler INHALE 2 PUFFS BY MOUTH INTO THE LUNGS EVERY 4 HOURS AS NEEDED   bictegravir-emtricitabine -tenofovir  AF (BIKTARVY ) 50-200-25 MG TABS tablet TAKE 1 TABLET BY MOUTH DAILY.   Biotin w/ Vitamins C & E (HAIR/SKIN/NAILS PO) Take by mouth.   Blood Pressure Monitoring (BLOOD PRESSURE CUFF) MISC 1 Device by Does not apply route daily. DX: I10   Cholecalciferol (VITAMIN D ) 125 MCG (5000 UT) CAPS Take by mouth.   ELDERBERRY PO Take by mouth.   enalapril  (VASOTEC ) 20 MG tablet Take 1 tablet (20 mg total) by mouth daily.   fenofibrate  (TRICOR ) 48 MG tablet Take 1 tablet (48 mg total) by mouth daily.   fluticasone  (FLONASE ) 50 MCG/ACT nasal spray Place 1 spray into both nostrils daily.   fluticasone -salmeterol (ADVAIR  HFA) 115-21 MCG/ACT inhaler Inhale 1 puff into the lungs 2 (two) times daily.   hydrochlorothiazide  (HYDRODIURIL ) 25 MG tablet Take 1 tablet (25 mg total) by mouth daily.   metoprolol  tartrate (LOPRESSOR ) 100 MG tablet Take 1 tablet (100 mg total) by mouth daily.   montelukast  (SINGULAIR ) 10 MG tablet Take 1 tablet (10 mg total) by mouth at bedtime.   pravastatin  (PRAVACHOL ) 20 MG tablet Take 1 tablet (20 mg total) by  mouth daily.   Spacer/Aero-Holding Raguel FRENCH Use with advair  HFA   traZODone  (DESYREL ) 50 MG tablet Take 1/2-1 tablet (25-50 mg total) by mouth at bedtime as needed for sleep.   [DISCONTINUED] linaclotide  (LINZESS ) 145 MCG CAPS capsule Take 1 capsule (145 mcg total) by mouth daily before breakfast.   No facility-administered encounter medications on file as of 08/21/2024.     Patient Active Problem List   Diagnosis Date Noted   Umbilical hernia without obstruction or gangrene 05/01/2024   Primary osteoarthritis of both shoulders 05/01/2024   Severe persistent asthma, unspecified whether complicated (HCC) 10/19/2023   Depression, major, single episode, complete remission 03/28/2021   Mixed hyperlipidemia 03/28/2021   Primary insomnia 03/28/2021   Depression, major, single episode, moderate (HCC) 03/28/2021   Chronic idiopathic constipation 03/28/2021   Seasonal allergies 01/13/2018   Asthma 02/05/2017   Allergy 12/16/2016   Age related osteoporosis 08/19/2016   Essential hypertension 08/19/2016   Human immunodeficiency virus (HIV) disease (HCC) 08/19/2016   Hx of adenomatous polyp of colon 2015     Health Maintenance Due  Topic Date Due   Medicare Annual Wellness (AWV)  08/25/2024     Review of Systems 12 point ros is negative except what is mentioned above Physical Exam   BP 137/87   Pulse 73   Temp 98.5 F (36.9 C) (Oral)   Wt 180 lb (81.6 kg)   SpO2 95%   BMI 27.57  kg/m   Physical Exam  Constitutional:  oriented to person, place, and time. appears well-developed and well-nourished. No distress.  HENT: [place]/AT, PERRLA, no scleral icterus Mouth/Throat: Oropharynx is clear and moist. No oropharyngeal exudate.  Cardiovascular: Normal rate, regular rhythm and normal heart sounds. Exam reveals no gallop and no friction rub.  No murmur heard.  Pulmonary/Chest: Effort normal and breath sounds normal. No respiratory distress.  has no wheezes.  Neck = supple, no nuchal  rigidity Abdominal: Soft. Bowel sounds are normal.  exhibits no distension. There is no tenderness.  Lymphadenopathy: no cervical adenopathy. No axillary adenopathy Neurological: alert and oriented to person, place, and time.  Skin: Skin is warm and dry. No rash noted. No erythema.  Psychiatric: a normal mood and affect.  behavior is normal.   Lab Results  Component Value Date   CD4TCELL 14 (L) 05/31/2024   Lab Results  Component Value Date   CD4TABS 545 10/18/2023   CD4TABS 641 04/26/2023   CD4TABS 620 08/25/2021   Lab Results  Component Value Date   HIV1RNAQUANT NOT DETECTED 05/31/2024   Lab Results  Component Value Date   HEPBSAB POS (A) 09/24/2016   Lab Results  Component Value Date   LABRPR NON-REACTIVE 10/18/2023    CBC Lab Results  Component Value Date   WBC 7.6 05/31/2024   RBC 4.50 05/31/2024   HGB 14.0 05/31/2024   HCT 41.9 05/31/2024   PLT 232 05/31/2024   MCV 93.1 05/31/2024   MCH 31.1 05/31/2024   MCHC 33.4 05/31/2024   RDW 13.3 05/31/2024   LYMPHSABS 3,410 04/19/2023   MONOABS 0.2 02/03/2020   EOSABS 289 05/31/2024    BMET Lab Results  Component Value Date   NA 141 05/31/2024   K 3.8 05/31/2024   CL 104 05/31/2024   CO2 31 05/31/2024   GLUCOSE 113 (H) 05/31/2024   BUN 17 05/31/2024   CREATININE 0.85 05/31/2024   CALCIUM  9.3 05/31/2024   GFRNONAA 64 01/28/2021   GFRAA 75 01/28/2021      Assessment and Plan Hiv well controlled= reviewed lab results with patient and showed that she is well controlled while on biktarvy . We will give refills on biktarvy   Long term medication management = cr is stable  Depression - will start Fluoxetine  10mg  daily for a few month- for depression symptoms to see if any improvement  Hypertension = at goal. No need to change current regimen  Rtc 6 wk to check on depressoin  Uptodate on vaccines

## 2024-08-22 ENCOUNTER — Other Ambulatory Visit: Payer: Self-pay

## 2024-08-22 ENCOUNTER — Other Ambulatory Visit: Payer: Self-pay | Admitting: Family

## 2024-08-22 ENCOUNTER — Other Ambulatory Visit (HOSPITAL_BASED_OUTPATIENT_CLINIC_OR_DEPARTMENT_OTHER): Payer: Self-pay

## 2024-08-22 MED ORDER — ENALAPRIL MALEATE 20 MG PO TABS
20.0000 mg | ORAL_TABLET | Freq: Every day | ORAL | 1 refills | Status: AC
  Filled 2024-08-22: qty 90, 90d supply, fill #0

## 2024-08-27 ENCOUNTER — Other Ambulatory Visit: Payer: Self-pay | Admitting: Internal Medicine

## 2024-08-28 ENCOUNTER — Encounter: Payer: PPO | Admitting: Family

## 2024-08-28 ENCOUNTER — Other Ambulatory Visit (HOSPITAL_BASED_OUTPATIENT_CLINIC_OR_DEPARTMENT_OTHER): Payer: Self-pay

## 2024-08-29 ENCOUNTER — Encounter (HOSPITAL_BASED_OUTPATIENT_CLINIC_OR_DEPARTMENT_OTHER): Payer: Self-pay

## 2024-08-29 ENCOUNTER — Other Ambulatory Visit (HOSPITAL_COMMUNITY): Payer: Self-pay

## 2024-08-29 ENCOUNTER — Ambulatory Visit: Admitting: Family

## 2024-08-29 ENCOUNTER — Inpatient Hospital Stay (HOSPITAL_BASED_OUTPATIENT_CLINIC_OR_DEPARTMENT_OTHER): Admission: RE | Admit: 2024-08-29 | Discharge: 2024-08-29 | Attending: Family | Admitting: Family

## 2024-08-29 ENCOUNTER — Other Ambulatory Visit: Payer: Self-pay | Admitting: Internal Medicine

## 2024-08-29 ENCOUNTER — Encounter: Payer: Self-pay | Admitting: Family

## 2024-08-29 ENCOUNTER — Other Ambulatory Visit (HOSPITAL_BASED_OUTPATIENT_CLINIC_OR_DEPARTMENT_OTHER): Payer: Self-pay

## 2024-08-29 ENCOUNTER — Other Ambulatory Visit: Payer: Self-pay

## 2024-08-29 ENCOUNTER — Other Ambulatory Visit: Payer: Self-pay | Admitting: Family

## 2024-08-29 VITALS — BP 138/80 | HR 60 | Temp 97.9°F | Resp 20 | Ht 67.5 in | Wt 180.8 lb

## 2024-08-29 DIAGNOSIS — Z0000 Encounter for general adult medical examination without abnormal findings: Secondary | ICD-10-CM

## 2024-08-29 DIAGNOSIS — Z1231 Encounter for screening mammogram for malignant neoplasm of breast: Secondary | ICD-10-CM | POA: Insufficient documentation

## 2024-08-29 MED ORDER — HYDROCHLOROTHIAZIDE 25 MG PO TABS
25.0000 mg | ORAL_TABLET | Freq: Every day | ORAL | 1 refills | Status: AC
  Filled 2024-08-29: qty 90, 90d supply, fill #0

## 2024-08-29 NOTE — Progress Notes (Signed)
 Chief Complaint  Patient presents with   [facility] Wellness    Annual Wellness Visit.     Subjective:   Maria Adkins is a 73 y.o. female who presents for a Medicare Annual Wellness Visit.  Visit info / Clinical Intake: Medicare Wellness Visit Type:: Initial Annual Wellness Visit Persons participating in visit and providing information:: patient Medicare Wellness Visit Mode:: In-person (required for WTM) Interpreter Needed?: No Pre-visit prep was completed: yes AWV questionnaire completed by patient prior to visit?: no Living arrangements:: lives with spouse/significant other Patient's Overall Health Status Rating: good Typical amount of pain: some Does pain affect daily life?: no Are you currently prescribed opioids?: no  Dietary Habits and Nutritional Risks How many meals a day?: 2 Eats fruit and vegetables daily?: yes Most meals are obtained by: preparing own meals In the last 2 weeks, have you had any of the following?: none Diabetic:: no  Functional Status Activities of Daily Living (to include ambulation/medication): (Patient-Rptd) Independent Ambulation: (Patient-Rptd) Independent Medication Administration: (Patient-Rptd) Independent Home Management (perform basic housework or laundry): (Patient-Rptd) Independent Manage your own finances?: (Patient-Rptd) yes Primary transportation is: (Patient-Rptd) driving  Fall Screening Falls in the past year?: 0 Number of falls in past year: 0 Was there an injury with Fall?: 0 Fall Risk Category Calculator: 0 Patient Fall Risk Level: Low Fall Risk  Fall Risk Patient at Risk for Falls Due to: No Fall Risks Fall risk Follow up: Falls evaluation completed  Home and Transportation Safety: All rugs have non-skid backing?: (Patient-Rptd) yes All stairs or steps have railings?: (Patient-Rptd) N/A, no stairs Grab bars in the bathtub or shower?: (Patient-Rptd) yes Have non-skid surface in bathtub or shower?: (Patient-Rptd)  yes Good home lighting?: (Patient-Rptd) yes Regular seat belt use?: (Patient-Rptd) yes Hospital stays in the last year:: (Patient-Rptd) no  Cognitive Assessment Difficulty concentrating, remembering, or making decisions? : no Will 6CIT or Mini Cog be Completed: yes What year is it?: 0 points What month is it?: 0 points Give patient an address phrase to remember (5 components): [address] [facility] [place]  About what time is it?: 0 points Count backwards from 20 to 1: 0 points Say the months of the year in reverse: 0 points Repeat the address phrase from earlier: 2 points 6 CIT Score: 2 points  Advance Directives (For Healthcare) Does Patient Have a Medical Advance Directive?: Yes Does patient want to make changes to medical advance directive?: No - Patient declined Type of Advance Directive: Healthcare Power of Attorney Copy of Healthcare Power of Attorney in Chart?: No - copy requested Would patient like information on creating a medical advance directive?: No - Patient declined    Allergies (verified) Patient has no known allergies.   Current Medications (verified) Outpatient Encounter Medications as of 08/29/2024  Medication Sig   albuterol  (PROVENTIL ) (2.5 MG/3ML) 0.083% nebulizer solution Take 3 mLs (2.5 mg total) by nebulization every 4 (four) hours as needed for wheezing or shortness of breath.   albuterol  (VENTOLIN  HFA) 108 (90 Base) MCG/ACT inhaler INHALE 2 PUFFS BY MOUTH INTO THE LUNGS EVERY 4 HOURS AS NEEDED   bictegravir-emtricitabine -tenofovir  AF (BIKTARVY ) 50-200-25 MG TABS tablet TAKE 1 TABLET BY MOUTH DAILY.   Biotin w/ Vitamins C & E (HAIR/SKIN/NAILS PO) Take by mouth.   Blood Pressure Monitoring (BLOOD PRESSURE CUFF) MISC 1 Device by Does not apply route daily. DX: I10   Cholecalciferol (VITAMIN D ) 125 MCG (5000 UT) CAPS Take by mouth.   ELDERBERRY PO Take by mouth.   enalapril  (  VASOTEC ) 20 MG tablet Take 1 tablet (20 mg total) by mouth daily.    fenofibrate  (TRICOR ) 48 MG tablet Take 1 tablet (48 mg total) by mouth daily.   FLUoxetine  (PROZAC ) 10 MG capsule Take 1 capsule (10 mg total) by mouth daily.   fluticasone  (FLONASE ) 50 MCG/ACT nasal spray Place 1 spray into both nostrils daily.   fluticasone -salmeterol (ADVAIR  HFA) 115-21 MCG/ACT inhaler Inhale 1 puff into the lungs 2 (two) times daily.   hydrochlorothiazide  (HYDRODIURIL ) 25 MG tablet Take 1 tablet (25 mg total) by mouth daily.   metoprolol  tartrate (LOPRESSOR ) 100 MG tablet Take 1 tablet (100 mg total) by mouth daily.   montelukast  (SINGULAIR ) 10 MG tablet Take 1 tablet (10 mg total) by mouth at bedtime.   pravastatin  (PRAVACHOL ) 20 MG tablet Take 1 tablet (20 mg total) by mouth daily.   Spacer/Aero-Holding Raguel FRENCH Use with advair  HFA   traZODone  (DESYREL ) 50 MG tablet Take 1/2-1 tablet (25-50 mg total) by mouth at bedtime as needed for sleep.   [DISCONTINUED] hydrochlorothiazide  (HYDRODIURIL ) 25 MG tablet Take 1 tablet (25 mg total) by mouth daily.   [DISCONTINUED] linaclotide  (LINZESS ) 145 MCG CAPS capsule Take 1 capsule (145 mcg total) by mouth daily before breakfast.   No facility-administered encounter medications on file as of 08/29/2024.    History: Past Medical History:  Diagnosis Date   Allergy    seasonal allergies   Asthma    uses unhaler   Blood transfusion without reported diagnosis 1990's   Cataract    bilateral sx   Depression    on meds   High blood pressure    on meds   HIV (human immunodeficiency virus infection) (HCC)    on meds   Hx of adenomatous polyp of colon 2015   Hyperlipidemia    on meds   Osteoarthritis    Osteopenia    Past Surgical History:  Procedure Laterality Date   Biopsy of Liver     CATARACT EXTRACTION, BILATERAL     CHOLECYSTECTOMY     COLONOSCOPY  2015   MAC-Dr.Liakos-prep(exc)-TA x 1   CYST REMOVAL HAND Left    CYSTECTOMY     off of back   LAPAROSCOPIC SALPINGOOPHERECTOMY Right    POLYPECTOMY  2015   TA    Family History  Problem Relation Age of Onset   Diabetes Mother        died at age 97   Hypertension Mother    Asthma Sister    Diabetes Sister    Hypertension Sister    Arthritis Sister    Diabetes Sister    Hypertension Sister    Asthma Sister    Arthritis Sister    Heart defect Sister    Asthma Son        controlled   Crohn's disease Niece    Colon polyps Neg Hx    Colon cancer Neg Hx    Esophageal cancer Neg Hx    Stomach cancer Neg Hx    Social History   Occupational History   Not on file  Tobacco Use   Smoking status: Former    Current packs/day: 0.00    Average packs/day: 1 pack/day for 14.0 years (14.0 ttl pk-yrs)    Types: Cigarettes    Start date: 09/14/1964    Quit date: 09/14/1978    Years since quitting: 45.9   Smokeless tobacco: Never  Vaping Use   Vaping status: Never Used  Substance and Sexual Activity   Alcohol use:  No   Drug use: No   Sexual activity: Yes    Partners: Male    Birth control/protection: Condom   Tobacco Counseling Counseling given: Not Answered  SDOH Screenings   Food Insecurity: No Food Insecurity (08/29/2024)  Housing: Unknown (08/29/2024)  Transportation Needs: No Transportation Needs (08/29/2024)  Utilities: Not At Risk (08/29/2024)  Depression (PHQ2-9): Low Risk (08/29/2024)  Recent Concern: Depression (PHQ2-9) - Medium Risk (05/31/2024)  Financial Resource Strain: Low Risk (08/28/2024)  Physical Activity: Insufficiently Active (08/28/2024)  Social Connections: Socially Integrated (08/28/2024)  Stress: No Stress Concern Present (08/28/2024)  Tobacco Use: Medium Risk (08/29/2024)   See flowsheets for full screening details  Depression Screen PHQ 2 & 9 Depression Scale- Over the past 2 weeks, how often have you been bothered by any of the following problems? Little interest or pleasure in doing things: 0 Feeling down, depressed, or hopeless (PHQ Adolescent also includes...irritable): 0 PHQ-2 Total Score: 0 Trouble  falling or staying asleep, or sleeping too much: 3 Feeling tired or having little energy: 2 Poor appetite or overeating (PHQ Adolescent also includes...weight loss): 0 Feeling bad about yourself - or that you are a failure or have let yourself or your family down: 0 Trouble concentrating on things, such as reading the newspaper or watching television (PHQ Adolescent also includes...like school work): 0 Moving or speaking so slowly that other people could have noticed. Or the opposite - being so fidgety or restless that you have been moving around a lot more than usual: 0 Thoughts that you would be better off dead, or of hurting yourself in some way: 0 PHQ-9 Total Score: 6 If you checked off any problems, how difficult have these problems made it for you to do your work, take care of things at home, or get along with other people?: Not difficult at all     Goals Addressed   None          Objective:    Today's Vitals   08/29/24 1047  BP: 138/80  Pulse: 60  Resp: 20  Temp: 97.9 F (36.6 C)  SpO2: 98%  Weight: 180 lb 12.8 oz (82 kg)  Height: 5' 7.5 (1.715 m)   Body mass index is 27.9 kg/m.  Hearing/Vision screen Vision Screening   Right eye Left eye Both eyes  Without correction 20/25 20/25 20/25   With correction     Hearing Screening - Comments:: No hearing issues. Immunizations and Health Maintenance Health Maintenance  Topic Date Due   Medicare Annual Wellness (AWV)  08/25/2024   COVID-19 Vaccine (7 - Mixed Product risk 2025-26 season) 11/28/2024   DTaP/Tdap/Td (2 - Td or Tdap) 09/22/2027   Colonoscopy  12/17/2027   Pneumococcal Vaccine: 50+ Years  Completed   Influenza Vaccine  Completed   Bone Density Scan  Completed   Hepatitis C Screening  Completed   Zoster Vaccines- Shingrix  Completed   Meningococcal B Vaccine  Aged Out   Mammogram  Discontinued        Assessment/Plan:  This is a routine wellness examination for Maria Adkins.  Patient Care Team: Jacaria Colburn,  Porter Nakama C, NP as PCP - General (Family Medicine)  I have personally reviewed and noted the following in the patients chart:   Medical and social history Use of alcohol, tobacco or illicit drugs  Current medications and supplements including opioid prescriptions. Functional ability and status Nutritional status Physical activity Advanced directives List of other physicians Hospitalizations, surgeries, and ER visits in previous 12 months Vitals Screenings to  include cognitive, depression, and falls Referrals and appointments  No orders of the defined types were placed in this encounter.  In addition, I have reviewed and discussed with patient certain preventive protocols, quality metrics, and best practice recommendations. A written personalized care plan for preventive services as well as general preventive health recommendations were provided to patient.   Maria Adkins Maria Pernice, NP   08/29/2024   No follow-ups on file.  After Visit Summary: (In Person-Printed) AVS printed and given to the patient  Nurse Notes: Up to date with health maintenance

## 2024-08-30 ENCOUNTER — Ambulatory Visit: Admitting: Orthopaedic Surgery

## 2024-08-30 ENCOUNTER — Other Ambulatory Visit (INDEPENDENT_AMBULATORY_CARE_PROVIDER_SITE_OTHER)

## 2024-08-30 ENCOUNTER — Other Ambulatory Visit (HOSPITAL_BASED_OUTPATIENT_CLINIC_OR_DEPARTMENT_OTHER): Payer: Self-pay

## 2024-08-30 ENCOUNTER — Other Ambulatory Visit: Payer: Self-pay

## 2024-08-30 ENCOUNTER — Other Ambulatory Visit: Payer: Self-pay | Admitting: Internal Medicine

## 2024-08-30 DIAGNOSIS — G8929 Other chronic pain: Secondary | ICD-10-CM

## 2024-08-30 DIAGNOSIS — M25511 Pain in right shoulder: Secondary | ICD-10-CM

## 2024-08-30 MED ORDER — DICLOFENAC SODIUM 75 MG PO TBEC
75.0000 mg | DELAYED_RELEASE_TABLET | Freq: Two times a day (BID) | ORAL | 1 refills | Status: AC | PRN
  Filled 2024-08-30: qty 60, 30d supply, fill #0

## 2024-08-30 MED ORDER — TRAMADOL HCL 50 MG PO TABS
50.0000 mg | ORAL_TABLET | Freq: Two times a day (BID) | ORAL | 0 refills | Status: AC | PRN
  Filled 2024-08-30: qty 30, 8d supply, fill #0

## 2024-08-30 MED ORDER — LIDOCAINE HCL 1 % IJ SOLN
3.0000 mL | INTRAMUSCULAR | Status: AC | PRN
  Administered 2024-08-30: 10:00:00 3 mL

## 2024-08-30 MED ORDER — METHYLPREDNISOLONE ACETATE 40 MG/ML IJ SUSP
40.0000 mg | INTRAMUSCULAR | Status: AC | PRN
  Administered 2024-08-30: 10:00:00 40 mg via INTRA_ARTICULAR

## 2024-08-30 MED FILL — Methylprednisolone Tab Therapy Pack 4 MG (21): ORAL | 6 days supply | Qty: 21 | Fill #0 | Status: AC

## 2024-08-30 NOTE — Progress Notes (Signed)
 The patient is a 73-year-old female who past.  She comes in today with right shoulder pain has been chronic on and off and on for some time now but has gotten worse she cannot sleep or lay on the right side.  She reports decreased range of motion and strength and no known injury.  She is not a diabetic.  On exam reaching overhead and behind her is difficult and painful.  There is no block to rotation but is very painful with positive signs of impingement of the right shoulder.  There was not any gross weakness of the rotator cuff.  X-rays of the right shoulder show a small inferior bone spur off the humeral head but the glenohumeral joint is well-maintained.  There is decreased subacromial outlet and AC joint arthritis.  She does have significant shoulder impingement.  I did recommend a steroid injection in the subacromial space which she tolerated well.  I am also going to start her on a steroid taper followed by diclofenac  and some tramadol .  We will send her to outpatient physical therapy at [facility] for any modalities that can decrease her right shoulder pain and improve her shoulder function.  Will then see her back in about 6 weeks to see how she is doing overall.  She agrees with this treatment plan.    Procedure Note  Patient: Maria Adkins             Date of Birth: 12-07-1950           MRN: [id number]             Visit Date: 08/30/2024  Procedures: Visit Diagnoses:  1. Chronic right shoulder pain     Large Joint Inj: R subacromial bursa on 08/30/2024 10:06 AM Indications: pain and diagnostic evaluation Details: 22 G 1.5 in needle  Arthrogram: No  Medications: 3 mL lidocaine  1 %; 40 mg methylPREDNISolone  acetate 40 MG/ML Outcome: tolerated well, no immediate complications Procedure, treatment alternatives, risks and benefits explained, specific risks discussed. Consent was given by the patient. Immediately prior to procedure a time out was called to verify the correct  patient, procedure, equipment, support staff and site/side marked as required. Patient was prepped and draped in the usual sterile fashion.

## 2024-09-01 ENCOUNTER — Other Ambulatory Visit (HOSPITAL_BASED_OUTPATIENT_CLINIC_OR_DEPARTMENT_OTHER): Payer: Self-pay

## 2024-09-01 ENCOUNTER — Encounter (HOSPITAL_BASED_OUTPATIENT_CLINIC_OR_DEPARTMENT_OTHER): Payer: Self-pay

## 2024-09-01 ENCOUNTER — Ambulatory Visit: Payer: Self-pay | Admitting: Adult Health

## 2024-09-01 NOTE — Progress Notes (Signed)
-    mammogram showed no malignancy

## 2024-09-10 ENCOUNTER — Other Ambulatory Visit (HOSPITAL_COMMUNITY): Payer: Self-pay

## 2024-09-11 ENCOUNTER — Other Ambulatory Visit: Payer: Self-pay

## 2024-09-11 ENCOUNTER — Other Ambulatory Visit (HOSPITAL_COMMUNITY): Payer: Self-pay

## 2024-09-11 NOTE — Progress Notes (Signed)
 Specialty Pharmacy Refill Coordination Note  Maria Adkins is a 73 y.o. female contacted today regarding refills of specialty medication(s) Bictegravir-Emtricitab-Tenofov (Biktarvy )   Patient requested (Patient-Rptd) Delivery   Delivery date: 09/12/24   Verified address: (Patient-Rptd) [address], [place], [place] [address]   Medication will be filled on: 09/11/24

## 2024-09-11 NOTE — Progress Notes (Signed)
 Clinical Intervention Note  Clinical Intervention Notes: Patient reported starting fluoxetine  and tramadol . No DDIs identified with Biktarvy .   Clinical Intervention Outcomes: Prevention of an adverse drug event   Advertising Account Planner

## 2024-09-13 NOTE — Progress Notes (Signed)
   This encounter was created in error - please disregard. No show

## 2024-09-15 ENCOUNTER — Other Ambulatory Visit: Payer: Self-pay

## 2024-09-15 ENCOUNTER — Other Ambulatory Visit (HOSPITAL_COMMUNITY): Payer: Self-pay

## 2024-09-15 ENCOUNTER — Other Ambulatory Visit (HOSPITAL_BASED_OUTPATIENT_CLINIC_OR_DEPARTMENT_OTHER): Payer: Self-pay

## 2024-09-18 ENCOUNTER — Other Ambulatory Visit (HOSPITAL_COMMUNITY): Payer: Self-pay

## 2024-09-18 ENCOUNTER — Other Ambulatory Visit: Payer: Self-pay

## 2024-09-25 ENCOUNTER — Other Ambulatory Visit (HOSPITAL_BASED_OUTPATIENT_CLINIC_OR_DEPARTMENT_OTHER): Payer: Self-pay

## 2024-10-02 ENCOUNTER — Other Ambulatory Visit (HOSPITAL_COMMUNITY): Payer: Self-pay

## 2024-10-02 ENCOUNTER — Ambulatory Visit (INDEPENDENT_AMBULATORY_CARE_PROVIDER_SITE_OTHER): Admitting: Internal Medicine

## 2024-10-02 ENCOUNTER — Other Ambulatory Visit (HOSPITAL_BASED_OUTPATIENT_CLINIC_OR_DEPARTMENT_OTHER): Payer: Self-pay

## 2024-10-02 ENCOUNTER — Encounter: Payer: Self-pay | Admitting: Internal Medicine

## 2024-10-02 ENCOUNTER — Other Ambulatory Visit: Payer: Self-pay

## 2024-10-02 VITALS — BP 120/74 | HR 70 | Temp 97.9°F | Wt 177.0 lb

## 2024-10-02 DIAGNOSIS — Z79899 Other long term (current) drug therapy: Secondary | ICD-10-CM

## 2024-10-02 DIAGNOSIS — B20 Human immunodeficiency virus [HIV] disease: Secondary | ICD-10-CM

## 2024-10-02 DIAGNOSIS — F321 Major depressive disorder, single episode, moderate: Secondary | ICD-10-CM | POA: Diagnosis not present

## 2024-10-02 DIAGNOSIS — M25511 Pain in right shoulder: Secondary | ICD-10-CM

## 2024-10-02 MED ORDER — FLUOXETINE HCL 10 MG PO CAPS
10.0000 mg | ORAL_CAPSULE | Freq: Every day | ORAL | 11 refills | Status: AC
  Filled 2024-10-02: qty 30, 30d supply, fill #0

## 2024-10-02 MED ORDER — BIKTARVY 50-200-25 MG PO TABS
1.0000 | ORAL_TABLET | Freq: Every day | ORAL | 11 refills | Status: AC
  Filled 2024-10-02 – 2024-10-04 (×2): qty 30, 30d supply, fill #0

## 2024-10-02 NOTE — Progress Notes (Signed)
 "     RFV: follow up for hiv disease  [name], female   DOB: May 08, 1951, 74 y.o.   MRN: [id number]  HPI Maria Adkins is a 74yo F  with HIV disease, CD 4 count, 650 and VL < 20 in sep 2026. Has not missed any doses of biktarvy . Excellent adherence. She also reports that she is  Doing better since starting on fluoxetine  10mg  daily  Saw dr vernetta for right shoulder arthritis, given steroid injection , now good mobility. Didn't take oral steroids. Has had to need diclofenac  for prn.  Outpatient Encounter Medications as of 10/02/2024  Medication Sig   albuterol  (PROVENTIL ) (2.5 MG/3ML) 0.083% nebulizer solution Take 3 mLs (2.5 mg total) by nebulization every 4 (four) hours as needed for wheezing or shortness of breath.   albuterol  (VENTOLIN  HFA) 108 (90 Base) MCG/ACT inhaler INHALE 2 PUFFS BY MOUTH INTO THE LUNGS EVERY 4 HOURS AS NEEDED   bictegravir-emtricitabine -tenofovir  AF (BIKTARVY ) 50-200-25 MG TABS tablet TAKE 1 TABLET BY MOUTH DAILY.   Biotin w/ Vitamins C & E (HAIR/SKIN/NAILS PO) Take by mouth.   Blood Pressure Monitoring (BLOOD PRESSURE CUFF) MISC 1 Device by Does not apply route daily. DX: I10   Cholecalciferol (VITAMIN D ) 125 MCG (5000 UT) CAPS Take by mouth.   diclofenac  (VOLTAREN ) 75 MG EC tablet Take 1 tablet (75 mg total) by mouth 2 (two) times daily between meals as needed. Start this medication after the steroid taper is completed.   ELDERBERRY PO Take by mouth.   enalapril  (VASOTEC ) 20 MG tablet Take 1 tablet (20 mg total) by mouth daily.   fenofibrate  (TRICOR ) 48 MG tablet Take 1 tablet (48 mg total) by mouth daily.   FLUoxetine  (PROZAC ) 10 MG capsule Take 1 capsule (10 mg total) by mouth daily.   fluticasone  (FLONASE ) 50 MCG/ACT nasal spray Place 1 spray into both nostrils daily.   fluticasone -salmeterol (ADVAIR  HFA) 115-21 MCG/ACT inhaler Inhale 1 puff into the lungs 2 (two) times daily.   hydrochlorothiazide  (HYDRODIURIL ) 25 MG tablet Take 1 tablet (25 mg  total) by mouth daily.   methylPREDNISolone  (MEDROL  DOSEPAK) 4 MG TBPK tablet Take as directed on package.   metoprolol  tartrate (LOPRESSOR ) 100 MG tablet Take 1 tablet (100 mg total) by mouth daily.   montelukast  (SINGULAIR ) 10 MG tablet Take 1 tablet (10 mg total) by mouth at bedtime.   pravastatin  (PRAVACHOL ) 20 MG tablet Take 1 tablet (20 mg total) by mouth daily.   Spacer/Aero-Holding Raguel FRENCH Use with advair  HFA   traMADol  (ULTRAM ) 50 MG tablet Take 1-2 tablets (50-100 mg total) by mouth every 12 (twelve) hours as needed.   traZODone  (DESYREL ) 50 MG tablet Take 1/2-1 tablet (25-50 mg total) by mouth at bedtime as needed for sleep.   [DISCONTINUED] linaclotide  (LINZESS ) 145 MCG CAPS capsule Take 1 capsule (145 mcg total) by mouth daily before breakfast.   No facility-administered encounter medications on file as of 10/02/2024.     Patient Active Problem List   Diagnosis Date Noted   Umbilical hernia without obstruction or gangrene 05/01/2024   Primary osteoarthritis of both shoulders 05/01/2024   Severe persistent asthma, unspecified whether complicated (HCC) 10/19/2023   Depression, major, single episode, complete remission 03/28/2021   Mixed hyperlipidemia 03/28/2021   Primary insomnia 03/28/2021   Depression, major, single episode, moderate (HCC) 03/28/2021   Chronic idiopathic constipation 03/28/2021   Seasonal allergies 01/13/2018   Asthma 02/05/2017   Allergy 12/16/2016   Age related osteoporosis 08/19/2016  Essential hypertension 08/19/2016   Human immunodeficiency virus (HIV) disease (HCC) 08/19/2016   Hx of adenomatous polyp of colon 2015     There are no preventive care reminders to display for this patient.   Review of Systems 12 point ros is otherwise negative Physical Exam   BP 120/74   Pulse 70   Temp 97.9 F (36.6 C) (Oral)   Wt 177 lb (80.3 kg)   SpO2 96%   BMI 27.31 kg/m   Physical Exam  Constitutional:  oriented to person, place, and time.  appears well-developed and well-nourished. No distress.  HENT: R[place]/AT, PERRLA, no scleral icterus Mouth/Throat: Oropharynx is clear and moist. No oropharyngeal exudate.  Cardiovascular: Normal rate, regular rhythm and normal heart sounds. Exam reveals no gallop and no friction rub.  No murmur heard.  Pulmonary/Chest: Effort normal and breath sounds normal. No respiratory distress.  has no wheezes.  Neck = supple, no nuchal rigidity Lymphadenopathy: no cervical adenopathy. No axillary adenopathy MSK ; full range of motion of both arms Skin: Skin is warm and dry. No rash noted. No erythema.  Psychiatric: a normal mood and affect.  behavior is normal.   Lab Results  Component Value Date   CD4TCELL 14 (L) 05/31/2024   Lab Results  Component Value Date   CD4TABS 545 10/18/2023   CD4TABS 641 04/26/2023   CD4TABS 620 08/25/2021   Lab Results  Component Value Date   HIV1RNAQUANT NOT DETECTED 05/31/2024   Lab Results  Component Value Date   HEPBSAB POS (A) 09/24/2016   Lab Results  Component Value Date   LABRPR NON-REACTIVE 10/18/2023    CBC Lab Results  Component Value Date   WBC 7.6 05/31/2024   RBC 4.50 05/31/2024   HGB 14.0 05/31/2024   HCT 41.9 05/31/2024   PLT 232 05/31/2024   MCV 93.1 05/31/2024   MCH 31.1 05/31/2024   MCHC 33.4 05/31/2024   RDW 13.3 05/31/2024   LYMPHSABS 3,410 04/19/2023   MONOABS 0.2 02/03/2020   EOSABS 289 05/31/2024    BMET Lab Results  Component Value Date   NA 141 05/31/2024   K 3.8 05/31/2024   CL 104 05/31/2024   CO2 31 05/31/2024   GLUCOSE 113 (H) 05/31/2024   BUN 17 05/31/2024   CREATININE 0.85 05/31/2024   CALCIUM  9.3 05/31/2024   GFRNONAA 64 01/28/2021   GFRAA 75 01/28/2021      Assessment and Plan HIV disease= will plan to check CD 4 count and HIV VL; will give refills on biktarvy   Long term medication management= will check cr to see that it is still stable  depression = continue with Fluoxetine  10mg  daily    Shoulder pain - now resolved since steroid injection    "

## 2024-10-03 LAB — T-HELPER CELL (CD4) - (RCID CLINIC ONLY)
CD4 % Helper T Cell: 13 % — ABNORMAL LOW (ref 33–65)
CD4 T Cell Abs: 622 /uL (ref 400–1790)

## 2024-10-04 ENCOUNTER — Other Ambulatory Visit (HOSPITAL_COMMUNITY): Payer: Self-pay

## 2024-10-04 ENCOUNTER — Other Ambulatory Visit: Payer: Self-pay

## 2024-10-04 LAB — LIPID PANEL
Cholesterol: 145 mg/dL
HDL: 46 mg/dL — ABNORMAL LOW
LDL Cholesterol (Calc): 78 mg/dL
Non-HDL Cholesterol (Calc): 99 mg/dL
Total CHOL/HDL Ratio: 3.2 (calc)
Triglycerides: 130 mg/dL

## 2024-10-04 LAB — COMPLETE METABOLIC PANEL WITHOUT GFR
AG Ratio: 1.9 (calc) (ref 1.0–2.5)
ALT: 15 U/L (ref 6–29)
AST: 17 U/L (ref 10–35)
Albumin: 4.3 g/dL (ref 3.6–5.1)
Alkaline phosphatase (APISO): 63 U/L (ref 37–153)
BUN: 15 mg/dL (ref 7–25)
CO2: 34 mmol/L — ABNORMAL HIGH (ref 20–32)
Calcium: 9.6 mg/dL (ref 8.6–10.4)
Chloride: 101 mmol/L (ref 98–110)
Creat: 0.85 mg/dL (ref 0.60–1.00)
Globulin: 2.3 g/dL (ref 1.9–3.7)
Glucose, Bld: 79 mg/dL (ref 65–99)
Potassium: 3.7 mmol/L (ref 3.5–5.3)
Sodium: 141 mmol/L (ref 135–146)
Total Bilirubin: 1 mg/dL (ref 0.2–1.2)
Total Protein: 6.6 g/dL (ref 6.1–8.1)

## 2024-10-04 LAB — CBC WITH DIFFERENTIAL/PLATELET
Absolute Lymphocytes: 5320 {cells}/uL — ABNORMAL HIGH (ref 850–3900)
Absolute Monocytes: 698 {cells}/uL (ref 200–950)
Basophils Absolute: 93 {cells}/uL (ref 0–200)
Basophils Relative: 1 %
Eosinophils Absolute: 251 {cells}/uL (ref 15–500)
Eosinophils Relative: 2.7 %
HCT: 40.7 % (ref 35.9–46.0)
Hemoglobin: 13.7 g/dL (ref 11.7–15.5)
MCH: 30.9 pg (ref 27.0–33.0)
MCHC: 33.7 g/dL (ref 31.6–35.4)
MCV: 91.7 fL (ref 81.4–101.7)
MPV: 10.3 fL (ref 7.5–12.5)
Monocytes Relative: 7.5 %
Neutro Abs: 2939 {cells}/uL (ref 1500–7800)
Neutrophils Relative %: 31.6 %
Platelets: 271 Thousand/uL (ref 140–400)
RBC: 4.44 Million/uL (ref 3.80–5.10)
RDW: 13.3 % (ref 11.0–15.0)
Total Lymphocyte: 57.2 %
WBC: 9.3 Thousand/uL (ref 3.8–10.8)

## 2024-10-04 LAB — HIV-1 RNA QUANT-NO REFLEX-BLD
HIV 1 RNA Quant: 34 {copies}/mL — ABNORMAL HIGH
HIV-1 RNA Quant, Log: 1.53 {Log_copies}/mL — ABNORMAL HIGH

## 2024-10-04 LAB — SYPHILIS: RPR W/REFLEX TO RPR TITER AND TREPONEMAL ANTIBODIES, TRADITIONAL SCREENING AND DIAGNOSIS ALGORITHM: RPR Ser Ql: NONREACTIVE

## 2024-10-04 NOTE — Progress Notes (Signed)
 Specialty Pharmacy Refill Coordination Note  Salena Ortlieb is a 74 y.o. female contacted today regarding refills of specialty medication(s) Bictegravir-Emtricitab-Tenofov (Biktarvy )   Patient requested (Patient-Rptd) Delivery   Delivery date: 10/06/24   Verified address: (Patient-Rptd) [address], [place], [place] [address]   Medication will be filled on: 10/05/24

## 2024-10-05 ENCOUNTER — Other Ambulatory Visit: Payer: Self-pay

## 2024-10-10 ENCOUNTER — Other Ambulatory Visit (HOSPITAL_BASED_OUTPATIENT_CLINIC_OR_DEPARTMENT_OTHER): Payer: Self-pay

## 2024-10-11 ENCOUNTER — Ambulatory Visit: Admitting: Orthopaedic Surgery

## 2024-10-18 ENCOUNTER — Other Ambulatory Visit (HOSPITAL_COMMUNITY): Payer: Self-pay

## 2024-10-20 ENCOUNTER — Other Ambulatory Visit (HOSPITAL_BASED_OUTPATIENT_CLINIC_OR_DEPARTMENT_OTHER): Payer: Self-pay

## 2024-10-20 ENCOUNTER — Telehealth: Payer: Self-pay

## 2024-10-20 DIAGNOSIS — J4550 Severe persistent asthma, uncomplicated: Secondary | ICD-10-CM

## 2024-10-20 MED ORDER — MONTELUKAST SODIUM 10 MG PO TABS
10.0000 mg | ORAL_TABLET | Freq: Every day | ORAL | 3 refills | Status: AC
  Filled 2024-10-20: qty 90, 90d supply, fill #0

## 2024-10-20 NOTE — Addendum Note (Signed)
 Addended byBETHA LEONARDA BURDOCK C on: 10/20/2024 04:17 PM   Modules accepted: Orders

## 2024-10-20 NOTE — Telephone Encounter (Signed)
 Copied from CRM [id number]. Topic: Clinical - Medication Refill >> Oct 20, 2024  9:49 AM Debby BROCKS wrote: Medication: montelukast  (SINGULAIR ) 10 MG tablet Patient states the doctor that used to prescribe this for her no longer works at that pulmonary location and wants to know if [facility] can authorize it for her  Has the patient contacted their pharmacy? No (Agent: If no, request that the patient contact the pharmacy for the refill. If patient does not wish to contact the pharmacy document the reason why and proceed with request.) (Agent: If yes, when and what did the pharmacy advise?)  This is the patient's preferred pharmacy:  [facility] [address] [place] [address] Phone: [phone] Fax: [phone]  Is this the correct pharmacy for this prescription? Yes If no, delete pharmacy and type the correct one.   Has the prescription been filled recently? No  Is the patient out of the medication? Yes  Has the patient been seen for an appointment in the last year OR does the patient have an upcoming appointment? Yes  Can we respond through MyChart? Yes  Agent: Please be advised that Rx refills may take up to 3 business days. We ask that you follow-up with your pharmacy.

## 2024-10-20 NOTE — Telephone Encounter (Signed)
 Singulair  refilled as requested.

## 2025-03-19 ENCOUNTER — Ambulatory Visit: Payer: Self-pay | Admitting: Internal Medicine

## 2025-05-02 ENCOUNTER — Encounter: Payer: Self-pay | Admitting: Family
